# Patient Record
Sex: Female | Born: 1965 | Race: White | Hispanic: No | Marital: Married | Smoking: Never smoker
Health system: Southern US, Community
[De-identification: ages and names within clinical notes are randomized; demographics above are authoritative.]

## PROBLEM LIST (undated history)

## (undated) DIAGNOSIS — G43909 Migraine, unspecified, not intractable, without status migrainosus: Secondary | ICD-10-CM

## (undated) DIAGNOSIS — K760 Fatty (change of) liver, not elsewhere classified: Secondary | ICD-10-CM

## (undated) DIAGNOSIS — G4733 Obstructive sleep apnea (adult) (pediatric): Secondary | ICD-10-CM

## (undated) DIAGNOSIS — F419 Anxiety disorder, unspecified: Secondary | ICD-10-CM

## (undated) DIAGNOSIS — N200 Calculus of kidney: Secondary | ICD-10-CM

## (undated) DIAGNOSIS — N289 Disorder of kidney and ureter, unspecified: Secondary | ICD-10-CM

## (undated) DIAGNOSIS — E7800 Pure hypercholesterolemia, unspecified: Secondary | ICD-10-CM

## (undated) DIAGNOSIS — E039 Hypothyroidism, unspecified: Secondary | ICD-10-CM

## (undated) DIAGNOSIS — D869 Sarcoidosis, unspecified: Secondary | ICD-10-CM

## (undated) DIAGNOSIS — I10 Essential (primary) hypertension: Secondary | ICD-10-CM

## (undated) DIAGNOSIS — F439 Reaction to severe stress, unspecified: Secondary | ICD-10-CM

## (undated) DIAGNOSIS — R7303 Prediabetes: Secondary | ICD-10-CM

## (undated) DIAGNOSIS — K6389 Other specified diseases of intestine: Secondary | ICD-10-CM

## (undated) DIAGNOSIS — M549 Dorsalgia, unspecified: Secondary | ICD-10-CM

## (undated) DIAGNOSIS — R0602 Shortness of breath: Secondary | ICD-10-CM

## (undated) DIAGNOSIS — E119 Type 2 diabetes mellitus without complications: Secondary | ICD-10-CM

## (undated) DIAGNOSIS — L439 Lichen planus, unspecified: Secondary | ICD-10-CM

## (undated) DIAGNOSIS — R110 Nausea: Secondary | ICD-10-CM

## (undated) DIAGNOSIS — K5900 Constipation, unspecified: Secondary | ICD-10-CM

## (undated) DIAGNOSIS — M2550 Pain in unspecified joint: Secondary | ICD-10-CM

## (undated) HISTORY — DX: Other specified diseases of intestine: K63.89

## (undated) HISTORY — DX: Reaction to severe stress, unspecified: F43.9

## (undated) HISTORY — DX: Migraine, unspecified, not intractable, without status migrainosus: G43.909

## (undated) HISTORY — DX: Calculus of kidney: N20.0

## (undated) HISTORY — DX: Nausea: R11.0

## (undated) HISTORY — DX: Hypothyroidism, unspecified: E03.9

## (undated) HISTORY — DX: Fatty (change of) liver, not elsewhere classified: K76.0

## (undated) HISTORY — DX: Obstructive sleep apnea (adult) (pediatric): G47.33

## (undated) HISTORY — DX: Shortness of breath: R06.02

## (undated) HISTORY — PX: LITHOTRIPSY: SUR834

## (undated) HISTORY — DX: Anxiety disorder, unspecified: F41.9

## (undated) HISTORY — DX: Dorsalgia, unspecified: M54.9

## (undated) HISTORY — PX: LYMPH NODE DISSECTION: SHX5087

## (undated) HISTORY — DX: Constipation, unspecified: K59.00

## (undated) HISTORY — DX: Disorder of kidney and ureter, unspecified: N28.9

## (undated) HISTORY — DX: Pure hypercholesterolemia, unspecified: E78.00

## (undated) HISTORY — DX: Sarcoidosis, unspecified: D86.9

## (undated) HISTORY — PX: COLONOSCOPY: SHX174

## (undated) HISTORY — DX: Pain in unspecified joint: M25.50

## (undated) HISTORY — DX: Prediabetes: R73.03

## (undated) HISTORY — DX: Type 2 diabetes mellitus without complications: E11.9

---
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
## 1998-05-04 ENCOUNTER — Other Ambulatory Visit: Admission: RE | Admit: 1998-05-04 | Discharge: 1998-05-04 | Payer: Self-pay | Admitting: Gynecology

## 1999-05-17 ENCOUNTER — Other Ambulatory Visit: Admission: RE | Admit: 1999-05-17 | Discharge: 1999-05-17 | Payer: Self-pay | Admitting: Obstetrics and Gynecology

## 2000-06-20 ENCOUNTER — Encounter: Admission: RE | Admit: 2000-06-20 | Discharge: 2000-06-20 | Payer: Self-pay | Admitting: Family Medicine

## 2000-06-20 ENCOUNTER — Encounter: Payer: Self-pay | Admitting: Family Medicine

## 2000-06-25 ENCOUNTER — Other Ambulatory Visit: Admission: RE | Admit: 2000-06-25 | Discharge: 2000-06-25 | Payer: Self-pay | Admitting: Obstetrics and Gynecology

## 2000-06-27 ENCOUNTER — Encounter: Admission: RE | Admit: 2000-06-27 | Discharge: 2000-08-07 | Payer: Self-pay | Admitting: Family Medicine

## 2000-09-12 ENCOUNTER — Encounter: Payer: Self-pay | Admitting: Family Medicine

## 2000-09-12 ENCOUNTER — Ambulatory Visit (HOSPITAL_COMMUNITY): Admission: RE | Admit: 2000-09-12 | Discharge: 2000-09-12 | Payer: Self-pay | Admitting: Family Medicine

## 2001-10-02 ENCOUNTER — Other Ambulatory Visit: Admission: RE | Admit: 2001-10-02 | Discharge: 2001-10-02 | Payer: Self-pay | Admitting: *Deleted

## 2002-11-13 ENCOUNTER — Ambulatory Visit (HOSPITAL_BASED_OUTPATIENT_CLINIC_OR_DEPARTMENT_OTHER): Admission: RE | Admit: 2002-11-13 | Discharge: 2002-11-13 | Payer: Self-pay | Admitting: Urology

## 2002-11-13 ENCOUNTER — Encounter: Payer: Self-pay | Admitting: Urology

## 2003-01-20 ENCOUNTER — Other Ambulatory Visit: Admission: RE | Admit: 2003-01-20 | Discharge: 2003-01-20 | Payer: Self-pay | Admitting: *Deleted

## 2004-02-11 ENCOUNTER — Ambulatory Visit (HOSPITAL_COMMUNITY): Admission: RE | Admit: 2004-02-11 | Discharge: 2004-02-11 | Payer: Self-pay | Admitting: Gastroenterology

## 2005-03-06 ENCOUNTER — Other Ambulatory Visit: Admission: RE | Admit: 2005-03-06 | Discharge: 2005-03-06 | Payer: Self-pay | Admitting: Gynecology

## 2006-05-25 ENCOUNTER — Other Ambulatory Visit: Admission: RE | Admit: 2006-05-25 | Discharge: 2006-05-25 | Payer: Self-pay | Admitting: Obstetrics and Gynecology

## 2006-07-23 ENCOUNTER — Ambulatory Visit (HOSPITAL_BASED_OUTPATIENT_CLINIC_OR_DEPARTMENT_OTHER): Admission: RE | Admit: 2006-07-23 | Discharge: 2006-07-23 | Payer: Self-pay | Admitting: Obstetrics and Gynecology

## 2006-12-18 ENCOUNTER — Ambulatory Visit (HOSPITAL_BASED_OUTPATIENT_CLINIC_OR_DEPARTMENT_OTHER): Admission: RE | Admit: 2006-12-18 | Discharge: 2006-12-18 | Payer: Self-pay | Admitting: Obstetrics and Gynecology

## 2006-12-18 HISTORY — PX: NOVASURE ABLATION: SHX5394

## 2007-06-04 ENCOUNTER — Other Ambulatory Visit: Admission: RE | Admit: 2007-06-04 | Discharge: 2007-06-04 | Payer: Self-pay | Admitting: Obstetrics and Gynecology

## 2007-06-20 HISTORY — PX: ROTATOR CUFF REPAIR: SHX139

## 2007-10-28 ENCOUNTER — Encounter: Admission: RE | Admit: 2007-10-28 | Discharge: 2007-10-28 | Payer: Self-pay | Admitting: Orthopaedic Surgery

## 2008-06-04 ENCOUNTER — Other Ambulatory Visit: Admission: RE | Admit: 2008-06-04 | Discharge: 2008-06-04 | Payer: Self-pay | Admitting: Obstetrics and Gynecology

## 2008-12-15 ENCOUNTER — Encounter: Admission: RE | Admit: 2008-12-15 | Discharge: 2008-12-15 | Payer: Self-pay | Admitting: Internal Medicine

## 2009-09-17 DIAGNOSIS — E038 Other specified hypothyroidism: Secondary | ICD-10-CM

## 2009-09-17 HISTORY — DX: Other specified hypothyroidism: E03.8

## 2010-11-01 NOTE — Op Note (Signed)
[name], [name]               ACCOUNT NO.:  [id number]   MEDICAL RECORD NO.:  [id number]          PATIENT TYPE:  AMB   LOCATION:  [facility]                         FACILITY:  [facility]   PHYSICIAN:  Cynthia P. Romine, M.D.DATE OF BIRTH:  08-14-1965   DATE OF PROCEDURE:  DATE OF DISCHARGE:                               OPERATIVE REPORT   PREOPERATIVE DIAGNOSIS:  Menorrhagia.   POSTOPERATIVE DIAGNOSIS:  Menorrhagia.   PROCEDURE:  NovaSure endometrial ablation and hysteroscopy.   SURGEON:  Cynthia P. Romine, M.D.   ANESTHESIA:  General by LMA.   ESTIMATED BLOOD LOSS:  Minimal.   COMPLICATIONS:  None.   DESCRIPTION OF PROCEDURE:  The patient was taken to the operating room  and after the induction of adequate general anesthesia by LMA was placed  in the dorsal lithotomy position and prepped and draped in the usual  fashion.  A Graves speculum was inserted in the cervix was grasped on  its anterior lip with a single-tooth tenaculum.  The uterus was sounded  to 9 cm.  The cervix was then dilated to a #8 Hegar, and the cervical  length, when assessed with a #8 Hegar, was 4.5 cm.  Therefore, the  cavity length was 4.5 cm.  The NovaSure array was introduced into the  cavity; it was seated, and the cavity width of 3.7 cm was obtained.  The  cavity assessment test was passed.  NovaSure ablation was then carried  out.  It to 100 seconds.  There were no complications.  The array was  pulled back into the sleeve and removed from the uterus.  Hysteroscopy  was then carried out; and the endometrium appeared well ablated.  Photographic documentation was taken.  The hysteroscope was removed.  The saline deficit was 0, the instruments were removed from the vagina;  and the procedure was terminated.  The patient went to the recovery room  in satisfactory condition.      Cynthia P. Romine, M.D.  Electronically Signed     CPR/MEDQ  D:  12/18/2006  T:  12/18/2006  Job:  [id number]

## 2011-02-27 ENCOUNTER — Other Ambulatory Visit: Payer: Self-pay | Admitting: Specialist

## 2011-03-07 ENCOUNTER — Ambulatory Visit
Admission: RE | Admit: 2011-03-07 | Discharge: 2011-03-07 | Disposition: A | Discharge status: Home / Self Care | Payer: BC Managed Care – PPO | Source: Ambulatory Visit | Attending: Specialist | Admitting: Specialist

## 2011-04-04 LAB — POCT PREGNANCY, URINE
Operator id: 268271
Preg Test, Ur: NEGATIVE

## 2011-04-05 LAB — CBC
HCT: 38.2
MCHC: 35.3
Platelets: 422 — ABNORMAL HIGH
RDW: 13.3

## 2013-04-23 ENCOUNTER — Encounter: Payer: Self-pay | Admitting: Certified Nurse Midwife

## 2013-04-25 ENCOUNTER — Ambulatory Visit (INDEPENDENT_AMBULATORY_CARE_PROVIDER_SITE_OTHER): Payer: BC Managed Care – PPO | Admitting: Certified Nurse Midwife

## 2013-04-25 ENCOUNTER — Encounter: Payer: Self-pay | Admitting: Certified Nurse Midwife

## 2013-04-25 VITALS — BP 124/80 | HR 74 | Resp 16 | Ht 61.75 in | Wt 212.0 lb

## 2013-04-25 DIAGNOSIS — Z01419 Encounter for gynecological examination (general) (routine) without abnormal findings: Secondary | ICD-10-CM

## 2013-04-25 NOTE — Patient Instructions (Signed)

## 2013-04-25 NOTE — Progress Notes (Signed)
47 y.o. G12P2002 Married Caucasian Fe here for annual exam. Periods very scant, just symptomatic that she knows it is her cycle time.  No health issues today. Sees PCP for  aex and labs.   Patient's last menstrual period was 04/07/2013.          Sexually active: yes  The current method of family planning is vasectomy and a Ablation 12/18/06 Exercising: no   Smoker:  no  Health Maintenance: Pap: 10/18/11 neg HPVHR  Not detected MMG:  03/2013 normal Colonoscopy:  2005 normal BMD:  never TDaP: 05/2008 Labs: Hgb  PCP,   U/A  PCP   reports that she has never smoked. She has never used smokeless tobacco. She reports that she drinks about 0.5 ounces of alcohol per week. She reports that she does not use illicit drugs.  Past Medical History  Diagnosis Date  . Subclinical hypothyroidism 4/11  . Kidney stones   . Migraines   . Hypercholesteremia     Past Surgical History  Procedure Laterality Date  . Novasure ablation  12-18-06  . Lithotripsy    . Rotator cuff repair Right 2009    Current Outpatient Prescriptions  Medication Sig Dispense Refill  . Omega-3 Fatty Acids (FISH OIL PO) Take by mouth daily.       No current facility-administered medications for this visit.    Family History  Problem Relation Age of Onset  . Diabetes Mother   . Cancer Paternal Grandmother     colon    ROS:  Pertinent items are noted in HPI.  Otherwise, a comprehensive ROS was negative.  Exam:   BP 124/80  Pulse 74  Resp 16  Ht 5' 1.75" (1.568 m)  Wt 212 lb (96.163 kg)  BMI 39.11 kg/m2  LMP 04/07/2013 Height: 5' 1.75" (156.8 cm)  Ht Readings from Last 3 Encounters:  04/25/13 5' 1.75" (1.568 m)    General appearance: alert, cooperative and appears stated age Head: Normocephalic, without obvious abnormality, atraumatic Neck: no adenopathy, supple, symmetrical, trachea midline and thyroid normal to inspection and palpation Lungs: clear to auscultation bilaterally Breasts: normal appearance, no  masses or tenderness, No nipple retraction or dimpling, No nipple discharge or bleeding, No axillary or supraclavicular adenopathy Heart: regular rate and rhythm Abdomen: soft, non-tender; no masses,  no organomegaly Extremities: extremities normal, atraumatic, no cyanosis or edema Skin: Skin color, texture, turgor normal. No rashes or lesions Lymph nodes: Cervical, supraclavicular, and axillary nodes normal. No abnormal inguinal nodes palpated Neurologic: Grossly normal   Pelvic: External genitalia:  no lesions              Urethra:  normal appearing urethra with no masses, tenderness or lesions              Bartholin's and Skene's: normal                 Vagina: normal appearing vagina with normal color and discharge, no lesions              Cervix: normal, non tender              Pap taken: no Bimanual Exam:  Uterus:  normal size, contour, position, consistency, mobility, non-tender and mid position              Adnexa: normal adnexa and no mass, fullness, tenderness               Rectovaginal: Confirms  Anus:  normal sphincter tone, no lesions  A:  Well Woman with normal exam  Hypothyroid stable no medication  P:   Reviewed health and wellness pertinent to exam  Continue follow up as indicated  Pap smear as per guidelines   Mammogram yearly pap smear not taken today  counseled on breast self exam, mammography screening, adequate intake of calcium and vitamin D, diet and exercise  return annually or prn  An After Visit Summary was printed and given to the patient.

## 2013-04-27 NOTE — Progress Notes (Signed)
Note reviewed, agree with plan.  Izaha Shughart, MD  

## 2013-04-27 NOTE — Progress Notes (Signed)
Note reviewed, agree with plan.  Georgianna Band, MD  

## 2014-04-20 ENCOUNTER — Encounter: Payer: Self-pay | Admitting: Certified Nurse Midwife

## 2014-11-26 ENCOUNTER — Encounter: Payer: Self-pay | Admitting: Certified Nurse Midwife

## 2014-11-26 ENCOUNTER — Ambulatory Visit (INDEPENDENT_AMBULATORY_CARE_PROVIDER_SITE_OTHER): Payer: BLUE CROSS/BLUE SHIELD | Admitting: Certified Nurse Midwife

## 2014-11-26 VITALS — BP 118/80 | HR 72 | Resp 16 | Ht 61.75 in | Wt 197.0 lb

## 2014-11-26 DIAGNOSIS — Z01419 Encounter for gynecological examination (general) (routine) without abnormal findings: Secondary | ICD-10-CM

## 2014-11-26 DIAGNOSIS — Z0000 Encounter for general adult medical examination without abnormal findings: Secondary | ICD-10-CM

## 2014-11-26 DIAGNOSIS — Z124 Encounter for screening for malignant neoplasm of cervix: Secondary | ICD-10-CM | POA: Diagnosis not present

## 2014-11-26 LAB — POCT URINALYSIS DIPSTICK
Bilirubin, UA: NEGATIVE
GLUCOSE UA: NEGATIVE
Ketones, UA: NEGATIVE
LEUKOCYTES UA: NEGATIVE
Nitrite, UA: NEGATIVE
PH UA: 5
PROTEIN UA: NEGATIVE
RBC UA: NEGATIVE
Urobilinogen, UA: NEGATIVE

## 2014-11-26 NOTE — Progress Notes (Signed)
49 y.o. G55P2002 Married  Caucasian Fe here for annual exam. Denies any hot flashes or night sweats. No menses or spotting with ablation. Seeing Dr.Vaughn for evaluation, labs and now is working diet,exercise and supplement and labs. Has lost 15 pounds over the past  2 months. No health issues today. Survived tax preparation season again!!  No LMP recorded. Patient has had an ablation.          Sexually active: Yes.    The current method of family planning is vasectomy.    Exercising: Yes.    bike Smoker:  no  Health Maintenance: Pap:  10-18-11 neg HPV HR neg MMG: 10-15-14 category b density,birads 2:neg Colonoscopy:  2005 neg BMD:   none TDaP:  2008 Labs: Poct urine-neg Self breast exam: done occ   reports that she has never smoked. She has never used smokeless tobacco. She reports that she drinks alcohol. She reports that she does not use illicit drugs.  Past Medical History  Diagnosis Date  . Subclinical hypothyroidism 4/11  . Kidney stones   . Migraines   . Hypercholesteremia     Past Surgical History  Procedure Laterality Date  . Novasure ablation  12-18-06  . Lithotripsy    . Rotator cuff repair Right 2009    Current Outpatient Prescriptions  Medication Sig Dispense Refill  . liothyronine (CYTOMEL) 25 MCG tablet daily. Take 2 pills in the morning, one at noon & 2 at night  1  . metFORMIN (GLUCOPHAGE) 500 MG tablet One in the morning & 1 at night    . Multiple Vitamins-Minerals (MULTIVITAMIN PO) Take by mouth daily.    . Omega-3 Fatty Acids (FISH OIL PO) Take by mouth daily.    . progesterone (PROMETRIUM) 100 MG capsule Take 2 pills day 15-28     No current facility-administered medications for this visit.    Family History  Problem Relation Age of Onset  . Diabetes Mother   . Cancer Paternal Grandmother     colon    ROS:  Pertinent items are noted in HPI.  Otherwise, a comprehensive ROS was negative.  Exam:   BP 118/80 mmHg  Pulse 72  Resp 16  Ht 5' 1.75"  (1.568 m)  Wt 197 lb (89.359 kg)  BMI 36.35 kg/m2 Height: 5' 1.75" (156.8 cm) Ht Readings from Last 3 Encounters:  11/26/14 5' 1.75" (1.568 m)  04/25/13 5' 1.75" (1.568 m)    General appearance: alert, cooperative and appears stated age Head: Normocephalic, without obvious abnormality, atraumatic Neck: no adenopathy, supple, symmetrical, trachea midline and thyroid normal to inspection and palpation Lungs: clear to auscultation bilaterally Breasts: normal appearance, no masses or tenderness, No nipple retraction or dimpling, No nipple discharge or bleeding, No axillary or supraclavicular adenopathy Heart: regular rate and rhythm Abdomen: soft, non-tender; no masses,  no organomegaly Extremities: extremities normal, atraumatic, no cyanosis or edema Skin: Skin color, texture, turgor normal. No rashes or lesions Lymph nodes: Cervical, supraclavicular, and axillary nodes normal. No abnormal inguinal nodes palpated Neurologic: Grossly normal   Pelvic: External genitalia:  no lesions              Urethra:  normal appearing urethra with no masses, tenderness or lesions              Bartholin's and Skene's: normal                 Vagina: normal appearing vagina with normal color and discharge, no lesions  Cervix: normal, non tender, no lesions, bleeding with pap only              Pap taken: Yes.   Bimanual Exam:  Uterus:  normal size, contour, position, consistency, mobility, non-tender              Adnexa: normal adnexa and no mass, fullness, tenderness               Rectovaginal: Confirms               Anus:  normal sphincter tone, no lesions  Chaperone present: Yes  A:  Well Woman with normal exam  Weight loss program with Dr Alessandra Bevels  P:   Reviewed health and wellness pertinent to exam  Continue follow up as indicated  Pap smear with HPV reflex   counseled on breast self exam, mammography screening, adequate intake of calcium and vitamin D, diet and exercise. Keep up  the good work with weight loss.  return annually or prn  An After Visit Summary was printed and given to the patient.

## 2014-11-26 NOTE — Patient Instructions (Signed)

## 2014-11-28 LAB — IPS PAP TEST WITH REFLEX TO HPV

## 2014-11-29 NOTE — Progress Notes (Signed)
Reviewed personally.  M. Suzanne Batoul Limes, MD.  

## 2015-02-08 ENCOUNTER — Other Ambulatory Visit: Payer: Self-pay | Admitting: Nurse Practitioner

## 2015-02-08 ENCOUNTER — Ambulatory Visit
Admission: RE | Admit: 2015-02-08 | Discharge: 2015-02-08 | Disposition: A | Discharge status: Home / Self Care | Payer: BLUE CROSS/BLUE SHIELD | Source: Ambulatory Visit | Attending: Nurse Practitioner | Admitting: Nurse Practitioner

## 2015-02-08 DIAGNOSIS — R0602 Shortness of breath: Secondary | ICD-10-CM

## 2015-06-25 ENCOUNTER — Encounter: Payer: Self-pay | Admitting: Certified Nurse Midwife

## 2015-06-25 ENCOUNTER — Ambulatory Visit (INDEPENDENT_AMBULATORY_CARE_PROVIDER_SITE_OTHER): Payer: BLUE CROSS/BLUE SHIELD | Admitting: Certified Nurse Midwife

## 2015-06-25 VITALS — BP 118/70 | HR 70 | Temp 98.1°F | Resp 16 | Ht 61.75 in | Wt 189.0 lb

## 2015-06-25 DIAGNOSIS — N949 Unspecified condition associated with female genital organs and menstrual cycle: Secondary | ICD-10-CM | POA: Diagnosis not present

## 2015-06-25 DIAGNOSIS — N390 Urinary tract infection, site not specified: Secondary | ICD-10-CM | POA: Diagnosis not present

## 2015-06-25 LAB — POCT URINALYSIS DIPSTICK
Bilirubin, UA: NEGATIVE
Glucose, UA: NEGATIVE
KETONES UA: NEGATIVE
Leukocytes, UA: NEGATIVE
Nitrite, UA: NEGATIVE
Protein, UA: NEGATIVE
RBC UA: NEGATIVE
UROBILINOGEN UA: NEGATIVE
pH, UA: 5

## 2015-06-25 MED ORDER — PHENAZOPYRIDINE HCL 200 MG PO TABS: 200.0000 mg | ORAL_TABLET | Freq: Three times a day (TID) | ORAL | Status: DC | PRN

## 2015-06-25 MED ORDER — NITROFURANTOIN MONOHYD MACRO 100 MG PO CAPS: 100.0000 mg | ORAL_CAPSULE | Freq: Two times a day (BID) | ORAL | Status: DC

## 2015-06-25 NOTE — Progress Notes (Signed)
[age] married white female g2p2002 here with complaint of UTI, with onset  on 2 weeks. Patient complaining of urinary frequency/urgency/ and pain with urination at end of stream. Also having some urinary incontinence. Patient denies fever, chills, nausea or back pain. No new personal products. Patient feels not related to sexual activity. Denies any vaginal symptoms except occasional burning and dryness after intercourse.  . Patient is drinking adequate water intake.   O: Healthy female WDWN Affect: Normal, orientation x 3 Skin : warm and dry CVAT: negative bilateral Abdomen: positive for suprapubic tenderness  Pelvic exam: External genital area: normal, no lesions Bladder,Urethra tender, Urethral meatus: tender,no redness  Vagina: scant watery  vaginal discharge, normal appearance  Affirm taken Cervix: normal, non tender Uterus:normal,non tender Adnexa: normal non tender, no fullness or masses   A: UTI Normal pelvic exam Poct urine-neg R/O vaginal infection ?vaginal dryness  P: Reviewed findings of UTI and need for treatment. XB:[id number]:Macrobid see order Rx Pyridium see order OZH:YQMVHLab:Urine culture Reviewed warning signs and symptoms of UTI and need to advise if occurring. Encouraged to limit soda, tea, and coffee and be sure to increase water intake. Will treat per affirm if indicated. Discussed vaginal dryness and association with UTI and vaginal symptoms. Discussed OTC coconut trial, instructions given for applicator use. Questions addressed.  Rv prn

## 2015-06-25 NOTE — Patient Instructions (Signed)

## 2015-06-26 LAB — WET PREP BY MOLECULAR PROBE
CANDIDA SPECIES: NEGATIVE
Gardnerella vaginalis: POSITIVE — AB
Trichomonas vaginosis: NEGATIVE

## 2015-06-28 LAB — URINE CULTURE

## 2015-06-28 NOTE — Progress Notes (Signed)
Reviewed personally.  M. Suzanne Cesilia Shinn, MD.  

## 2015-06-29 ENCOUNTER — Other Ambulatory Visit: Payer: Self-pay | Admitting: Certified Nurse Midwife

## 2015-06-29 DIAGNOSIS — B9689 Other specified bacterial agents as the cause of diseases classified elsewhere: Secondary | ICD-10-CM

## 2015-06-29 DIAGNOSIS — N760 Acute vaginitis: Principal | ICD-10-CM

## 2015-06-29 MED ORDER — METRONIDAZOLE 0.75 % VA GEL: 1.0000 | Freq: Every day | VAGINAL | Status: DC

## 2015-12-14 ENCOUNTER — Encounter: Payer: Self-pay | Admitting: Certified Nurse Midwife

## 2015-12-14 ENCOUNTER — Ambulatory Visit (INDEPENDENT_AMBULATORY_CARE_PROVIDER_SITE_OTHER): Payer: BLUE CROSS/BLUE SHIELD | Admitting: Certified Nurse Midwife

## 2015-12-14 VITALS — BP 102/64 | HR 76 | Resp 16 | Ht 61.75 in | Wt 189.0 lb

## 2015-12-14 DIAGNOSIS — Z0000 Encounter for general adult medical examination without abnormal findings: Secondary | ICD-10-CM | POA: Diagnosis not present

## 2015-12-14 DIAGNOSIS — N951 Menopausal and female climacteric states: Secondary | ICD-10-CM

## 2015-12-14 DIAGNOSIS — Z01419 Encounter for gynecological examination (general) (routine) without abnormal findings: Secondary | ICD-10-CM | POA: Diagnosis not present

## 2015-12-14 LAB — TSH: TSH: 2.44 m[IU]/L

## 2015-12-14 NOTE — Patient Instructions (Signed)

## 2015-12-14 NOTE — Progress Notes (Signed)
50 y.o. 92P2002 Married  Caucasian Fe here for annual exam. Periods occurring again monthly scant amount, one day. Previous ablation. No cramping or other symptoms. Some night sweats, no hot flashes. Denies pelvic pain or vaginal dryness.. Plans to see PCP for aex, labs soon. Family history of colon cancer, MGM. Plans colonoscopy this year. Has been working on weight loss! No other health issues today.   No LMP recorded. Patient has had an ablation.          Sexually active: Yes.    The current method of family planning is vasectomy and ablation.    Exercising: No.  The patient does not participate in regular exercise at present. Smoker:  no  Health Maintenance: Pap:  11/26/14 Pap smear negative 02 MMG:  10/20/15 BIRADS1 negative Colonoscopy:  2005 negative BMD:   none TDaP:  2008 Labs: will draw if needed.   reports that she has never smoked. She has never used smokeless tobacco. She reports that she does not drink alcohol or use illicit drugs.  Past Medical History  Diagnosis Date  . Subclinical hypothyroidism 4/11  . Kidney stones   . Migraines   . Hypercholesteremia     Past Surgical History  Procedure Laterality Date  . Novasure ablation  12-18-06  . Lithotripsy    . Rotator cuff repair Right 2009    Current Outpatient Prescriptions  Medication Sig Dispense Refill  . CONTRAVE 8-90 MG TB12 Take 2 tablets by mouth 2 (two) times daily.  0  . metroNIDAZOLE (METROGEL) 0.75 % vaginal gel Place 1 Applicatorful vaginally at bedtime. 70 g 0  . Multiple Vitamins-Minerals (MULTIVITAMIN PO) Take by mouth daily.    . nitrofurantoin, macrocrystal-monohydrate, (MACROBID) 100 MG capsule Take 1 capsule (100 mg total) by mouth 2 (two) times daily. 14 capsule 0  . Omega-3 Fatty Acids (FISH OIL PO) Take by mouth daily.    . phenazopyridine (PYRIDIUM) 200 MG tablet Take 1 tablet (200 mg total) by mouth 3 (three) times daily as needed for pain. 15 tablet 0   No current facility-administered  medications for this visit.    Family History  Problem Relation Age of Onset  . Diabetes Mother   . Cancer Paternal Grandmother     colon    ROS:  Pertinent items are noted in HPI.  Otherwise, a comprehensive ROS was negative.  Exam:   There were no vitals taken for this visit.   Ht Readings from Last 3 Encounters:  06/25/15 5' 1.75" (1.568 m)  11/26/14 5' 1.75" (1.568 m)  04/25/13 5' 1.75" (1.568 m)    General appearance: alert, cooperative and appears stated age Head: Normocephalic, without obvious abnormality, atraumatic Neck: no adenopathy, supple, symmetrical, trachea midline and thyroid normal to inspection and palpation Lungs: clear to auscultation bilaterally Breasts: normal appearance, no masses or tenderness, No nipple retraction or dimpling, No nipple discharge or bleeding, No axillary or supraclavicular adenopathy Heart: regular rate and rhythm Abdomen: soft, non-tender; no masses,  no organomegaly Extremities: extremities normal, atraumatic, no cyanosis or edema Skin: Skin color, texture, turgor normal. No rashes or lesions Lymph nodes: Cervical, supraclavicular, and axillary nodes normal. No abnormal inguinal nodes palpated Neurologic: Grossly normal   Pelvic: External genitalia:  no lesions              Urethra:  normal appearing urethra with no masses, tenderness or lesions              Bartholin's and Skene's: normal  Vagina: normal appearing vagina with normal color and discharge, no lesions              Cervix: normal,non tender, no lesions              Pap taken: No. Bimanual Exam:  Uterus:  normal size, contour, position, consistency, mobility, non-tender              Adnexa: normal adnexa and no mass, fullness, tenderness               Rectovaginal: Confirms               Anus:  normal sphincter tone, no lesions  Chaperone present: yes  A:  Well Woman with normal exam  ?Perimenopausal with bleeding with ablation  Screening  labs  Colonoscopy this year  P:   Reviewed health and wellness pertinent to exam  Discussed menopause/perimenopause and etiology, expectations of bleeding profile. Recommend labs to evaluate. Agreeable. Questions addressed. Handout given.  Labs FSH, TSH, Prolactin, Vitamin D  Patient will call after age 50 for referral to Dr. Loreta AveMann for colonscopy.  Pap smear as above not taken   counseled on breast self exam, mammography screening, menopause, adequate intake of calcium and vitamin D, diet and exercise  return annually or prn  An After Visit Summary was printed and given to the patient.

## 2015-12-15 LAB — VITAMIN D 25 HYDROXY (VIT D DEFICIENCY, FRACTURES): Vit D, 25-Hydroxy: 36 ng/mL (ref 30–100)

## 2015-12-15 LAB — PROLACTIN: PROLACTIN: 10.8 ng/mL

## 2015-12-15 LAB — FOLLICLE STIMULATING HORMONE: FSH: 15.6 m[IU]/mL

## 2015-12-15 NOTE — Progress Notes (Signed)
Reviewed personally.  M. Suzanne Davina Howlett, MD.  

## 2016-02-22 ENCOUNTER — Ambulatory Visit: Payer: BLUE CROSS/BLUE SHIELD | Admitting: Certified Nurse Midwife

## 2016-06-19 DIAGNOSIS — L439 Lichen planus, unspecified: Secondary | ICD-10-CM

## 2016-06-19 HISTORY — DX: Lichen planus, unspecified: L43.9

## 2016-10-25 ENCOUNTER — Other Ambulatory Visit: Payer: Self-pay | Admitting: Gastroenterology

## 2016-10-25 DIAGNOSIS — R945 Abnormal results of liver function studies: Principal | ICD-10-CM

## 2016-10-25 DIAGNOSIS — R7989 Other specified abnormal findings of blood chemistry: Secondary | ICD-10-CM

## 2016-11-02 ENCOUNTER — Ambulatory Visit
Admission: RE | Admit: 2016-11-02 | Discharge: 2016-11-02 | Disposition: A | Discharge status: Home / Self Care | Payer: BLUE CROSS/BLUE SHIELD | Source: Ambulatory Visit | Attending: Gastroenterology | Admitting: Gastroenterology

## 2016-11-02 DIAGNOSIS — R7989 Other specified abnormal findings of blood chemistry: Secondary | ICD-10-CM

## 2016-11-02 DIAGNOSIS — R945 Abnormal results of liver function studies: Principal | ICD-10-CM

## 2016-11-16 ENCOUNTER — Encounter: Payer: Self-pay | Admitting: Certified Nurse Midwife

## 2016-12-26 ENCOUNTER — Ambulatory Visit: Payer: BLUE CROSS/BLUE SHIELD | Admitting: Certified Nurse Midwife

## 2017-02-22 ENCOUNTER — Ambulatory Visit (INDEPENDENT_AMBULATORY_CARE_PROVIDER_SITE_OTHER): Payer: Self-pay

## 2017-02-22 ENCOUNTER — Encounter (INDEPENDENT_AMBULATORY_CARE_PROVIDER_SITE_OTHER): Payer: Self-pay | Admitting: Orthopedic Surgery

## 2017-02-22 ENCOUNTER — Ambulatory Visit (INDEPENDENT_AMBULATORY_CARE_PROVIDER_SITE_OTHER): Payer: BLUE CROSS/BLUE SHIELD | Admitting: Orthopedic Surgery

## 2017-02-22 VITALS — BP 146/99 | HR 97 | Resp 14 | Ht 62.0 in | Wt 185.0 lb

## 2017-02-22 DIAGNOSIS — M25512 Pain in left shoulder: Secondary | ICD-10-CM | POA: Diagnosis not present

## 2017-02-22 DIAGNOSIS — M7542 Impingement syndrome of left shoulder: Secondary | ICD-10-CM

## 2017-02-22 DIAGNOSIS — G8929 Other chronic pain: Secondary | ICD-10-CM | POA: Diagnosis not present

## 2017-02-22 NOTE — Progress Notes (Signed)
Office Visit Note   Patient: [name]           Date of Birth: 1965-08-06           MRN: [id number] Visit Date: 02/22/2017              Requested by: No referring provider defined for this encounter. PCP: System, Pcp Not In   Assessment & Plan: Visit Diagnoses:  1. Impingement syndrome of left shoulder   2. Chronic left shoulder pain     Plan:  #1: [name] is not interested in having a corticosteroid injection. She's had one previously. #2: We will proceed with a MRI scan of the left shoulder to rule out rotator cuff tear.  Follow-Up Instructions: No Follow-up on file.   Orders:  Orders Placed This Encounter  Procedures  . XR Shoulder Left  . MR Shoulder Left w/o contrast   No orders of the defined types were placed in this encounter.     Procedures: No procedures performed   Clinical Data: No additional findings.   Subjective: Chief Complaint  Patient presents with  . Left Shoulder - Pain    [name] is a 51 y o that presents with chronic Left shoulder pain x 6-8 months. Denies injury, Limited ROM overhead and twisting the arm. 2009 Right shoulder RTC tear surgery    HPI  [name] is a 51 year old white female who is seen today with chronic left shoulder pain. She's had limited range of motion with overhead activity. She's had a previous corticosteroid injection in 2013. Beneficial for a while. She however now is complaining of pain in the   Review of Systems  Constitutional: Negative for chills, fatigue and fever.  Eyes: Negative for itching.  Respiratory: Negative for chest tightness and shortness of breath.   Cardiovascular: Negative for chest pain, palpitations and leg swelling.  Gastrointestinal: Negative for blood in stool, constipation and diarrhea.  Musculoskeletal: Negative for back pain, joint swelling, neck pain and neck stiffness.  Neurological: Negative for dizziness, weakness, numbness and headaches.  Hematological: Does not  bruise/bleed easily.  Psychiatric/Behavioral: Negative for sleep disturbance. The patient is not nervous/anxious.      Objective: Vital Signs: BP (!) 146/99   Pulse 97   Resp 14   Ht  (1.575 m)   Wt 185 lb (83.9 kg)   BMI 33.84 kg/m   Physical Exam  Constitutional: She is oriented to person, place, and time. She appears well-developed and well-nourished.  HENT:  Head: Normocephalic and atraumatic.  Eyes: Pupils are equal, round, and reactive to light. EOM are normal.  Pulmonary/Chest: Effort normal.  Neurological: She is alert and oriented to person, place, and time.  Skin: Skin is warm and dry.  Psychiatric: She has a normal mood and affect. [name] behavior is normal. Judgment and thought content normal.    Ortho Exam Today she has range of motion abduction 150 flexion 170. External rotation 90 of abduction and 60 internal rotation 45. Positive empty can test. She has weakness with external rotation. Abduction is fairly strong but is a little bit weaker than on the right side. Some tenderness over the before meals joint.  Specialty Comments:  No specialty comments available.  Imaging: Xr Shoulder Left  Result Date: 02/22/2017 Three-view x-ray of the left shoulder reveals some before meals degenerative changes with spurring of the acromion. She does have a type 2 to  2-1/2 acromion. Glenohumeral joint appears intact.    PMFS History: There  are no active problems to display for this patient.  Past Medical History:  Diagnosis Date  . Hypercholesteremia   . Kidney stones   . Migraines   . Subclinical hypothyroidism 4/11    Family History  Problem Relation Age of Onset  . Diabetes Mother   . Cancer Paternal Grandmother        colon    Past Surgical History:  Procedure Laterality Date  . LITHOTRIPSY    . NOVASURE ABLATION  12-18-06  . ROTATOR CUFF REPAIR Right 2009   Social History   Occupational History  . Not on file.   Social History Main Topics  .  Smoking status: Never Smoker  . Smokeless tobacco: Never Used  . Alcohol use No  . Drug use: No  . Sexual activity: Yes    Partners: Male    Birth control/ protection: Surgical     Comment: Ablation, Vasectomy

## 2017-03-07 ENCOUNTER — Ambulatory Visit (INDEPENDENT_AMBULATORY_CARE_PROVIDER_SITE_OTHER): Payer: BLUE CROSS/BLUE SHIELD | Admitting: Certified Nurse Midwife

## 2017-03-07 ENCOUNTER — Encounter: Payer: Self-pay | Admitting: Certified Nurse Midwife

## 2017-03-07 VITALS — BP 118/78 | HR 68 | Resp 16 | Ht 61.75 in | Wt 199.0 lb

## 2017-03-07 DIAGNOSIS — N9089 Other specified noninflammatory disorders of vulva and perineum: Secondary | ICD-10-CM | POA: Diagnosis not present

## 2017-03-07 DIAGNOSIS — Z01419 Encounter for gynecological examination (general) (routine) without abnormal findings: Secondary | ICD-10-CM | POA: Diagnosis not present

## 2017-03-07 DIAGNOSIS — Z0000 Encounter for general adult medical examination without abnormal findings: Secondary | ICD-10-CM | POA: Diagnosis not present

## 2017-03-07 NOTE — Patient Instructions (Signed)

## 2017-03-07 NOTE — Progress Notes (Signed)
51 y.o. G35P2002 Married  Caucasian Fe here for annual exam. Periods scant to none with ablation. Patient had colonoscopy and was negative. Had [place] for fatty liver and real early stage per patient, was seen by Dr.Mann and will follow there if needed. Has noted some breast sharp pain once or twice monthly, but has been in swim suits a lot and feel related to no support. Had Mammogram in 11/01/16 all normal. Has not in a while now. No mass noted or skin change or nipple discharge. Some excessive caffeine. Saw nutritionist today to begin weight loss program. Has gained 10 pounds over the last year. Plans to start working out also. Noted a bump in vaginal area off and on please check. No other health issues today.   Patient's last menstrual period was 12/03/2016.          Sexually active: Yes.    The current method of family planning is vasectomy & ablation.    Exercising: No.  exercise Smoker:  no  Health Maintenance: Pap:  11-26-14 neg History of Abnormal Pap: no MMG:  11-01-16 category b density birads 1:neg Self Breast exams: yes Colonoscopy:  11-29-16 neg f/u 30yrs BMD:   none TDaP:  2008 ? UTD declines today Shingles: no Pneumonia: no Hep C and HIV: not done Labs: yes   reports that she has never smoked. She has never used smokeless tobacco. She reports that she does not drink alcohol or use drugs.  Past Medical History:  Diagnosis Date  . Hypercholesteremia   . Kidney stones   . Migraines   . Subclinical hypothyroidism 4/11    Past Surgical History:  Procedure Laterality Date  . LITHOTRIPSY    . NOVASURE ABLATION  12-18-06  . ROTATOR CUFF REPAIR Right 2009    Current Outpatient Prescriptions  Medication Sig Dispense Refill  . MILK THISTLE PO Take by mouth as needed.    . Omega-3 Fatty Acids (FISH OIL PO) Take by mouth daily.    . vitamin E 200 UNIT capsule Take 200 Units by mouth daily.     No current facility-administered medications for this visit.     Family History   Problem Relation Age of Onset  . Diabetes Mother   . Cancer Paternal Grandmother        colon    ROS:  Pertinent items are noted in HPI.  Otherwise, a comprehensive ROS was negative.  Exam:   BP 118/78   Pulse 68   Resp 16   Ht 5' 1.75" (1.568 m)   Wt 199 lb (90.3 kg)   LMP 12/03/2016   BMI 36.69 kg/m  Height: 5' 1.75" (156.8 cm) Ht Readings from Last 3 Encounters:  03/07/17 5' 1.75" (1.568 m)  02/22/17  (1.575 m)  12/14/15 5' 1.75" (1.568 m)    General appearance: alert, cooperative and appears stated age Head: Normocephalic, without obvious abnormality, atraumatic Neck: no adenopathy, supple, symmetrical, trachea midline and thyroid normal to inspection and palpation Lungs: clear to auscultation bilaterally Breasts: normal appearance, no masses or tenderness, No nipple retraction or dimpling, No nipple discharge or bleeding, No axillary or supraclavicular adenopathy, no point of tenderness noted bilateral Heart: regular rate and rhythm Abdomen: soft, non-tender; no masses,  no organomegaly Extremities: extremities normal, atraumatic, no cyanosis or edema Skin: Skin color, texture, turgor normal. No rashes or lesions Lymph nodes: Cervical, supraclavicular, and axillary nodes normal. No abnormal inguinal nodes palpated Neurologic: Grossly normal   Pelvic: External genitalia: normal female, nickel  size round flat cauliflower like appearance with white raised accent lesion noted on right vulva area, non tender, left normal appearance              Urethra:  normal appearing urethra with no masses, tenderness or lesions              Bartholin's and Skene's: normal                 Vagina: normal appearing vagina with normal color and discharge, no lesions              Cervix: no cervical motion tenderness, no lesions and normal appearance              Pap taken: No. Bimanual Exam:  Uterus:  normal size, contour, position, consistency, mobility, non-tender               Adnexa: normal adnexa and no mass, fullness, tenderness               Rectovaginal: Confirms               Anus:  normal sphincter tone, no lesions  Chaperone present: yes  A:  Well Woman with normal exam  Contraception spouse vasectomy  Perimenopausal, history of ablation  Right Vulvar lesion  Obese starting weight loss program now  Screening labs  P:   Reviewed health and wellness pertinent to exam  Discussed etiology of perimenopause/menopause and expectations. Questions addressed.  Discussed area of concern on vulva,( shown area to patient with mirror also)and feel biopsy of lesion indicated, for appropriate management. Patient agreeable. Patient will be called to schedule and aware of insurance information . Questions addressed.  Encouraged to complete her weight loss journey.  Labs: Hep C, Lipid Panel, CMP, Vitamin D, Hgb A1-C  Pap smear: no   counseled on breast self exam, mammography screening, feminine hygiene, adequate intake of calcium and vitamin D, diet and exercise  return annually or prn  An After Visit Summary was printed and given to the patient.

## 2017-03-08 ENCOUNTER — Ambulatory Visit
Admission: RE | Admit: 2017-03-08 | Discharge: 2017-03-08 | Disposition: A | Discharge status: Home / Self Care | Payer: BLUE CROSS/BLUE SHIELD | Source: Ambulatory Visit | Attending: Orthopaedic Surgery | Admitting: Orthopaedic Surgery

## 2017-03-08 DIAGNOSIS — G8929 Other chronic pain: Secondary | ICD-10-CM

## 2017-03-08 DIAGNOSIS — M25512 Pain in left shoulder: Principal | ICD-10-CM

## 2017-03-08 LAB — LIPID PANEL
Chol/HDL Ratio: 3.7 ratio (ref 0.0–4.4)
Cholesterol, Total: 227 mg/dL — ABNORMAL HIGH (ref 100–199)
HDL: 62 mg/dL (ref >39)
LDL CALC: 111 mg/dL — AB (ref 0–99)
Triglycerides: 272 mg/dL — ABNORMAL HIGH (ref 0–149)
VLDL CHOLESTEROL CAL: 54 mg/dL — AB (ref 5–40)

## 2017-03-08 LAB — COMPREHENSIVE METABOLIC PANEL
ALBUMIN: 4.1 g/dL (ref 3.5–5.5)
ALT: 16 IU/L (ref 0–32)
AST: 19 IU/L (ref 0–40)
Albumin/Globulin Ratio: 1.6 (ref 1.2–2.2)
Alkaline Phosphatase: 93 IU/L (ref 39–117)
BUN / CREAT RATIO: 18 (ref 9–23)
BUN: 13 mg/dL (ref 6–24)
CALCIUM: 9.5 mg/dL (ref 8.7–10.2)
CO2: 24 mmol/L (ref 20–29)
CREATININE: 0.71 mg/dL (ref 0.57–1.00)
Chloride: 98 mmol/L (ref 96–106)
GFR calc non Af Amer: 99 mL/min/{1.73_m2} (ref >59)
GFR, EST AFRICAN AMERICAN: 114 mL/min/{1.73_m2} (ref >59)
GLUCOSE: 94 mg/dL (ref 65–99)
Globulin, Total: 2.5 g/dL (ref 1.5–4.5)
Potassium: 4.2 mmol/L (ref 3.5–5.2)
Sodium: 136 mmol/L (ref 134–144)
TOTAL PROTEIN: 6.6 g/dL (ref 6.0–8.5)

## 2017-03-08 LAB — HEMOGLOBIN A1C
Est. average glucose Bld gHb Est-mCnc: 117 mg/dL
Hgb A1c MFr Bld: 5.7 % — ABNORMAL HIGH (ref 4.8–5.6)

## 2017-03-08 LAB — VITAMIN D 25 HYDROXY (VIT D DEFICIENCY, FRACTURES): Vit D, 25-Hydroxy: 30.3 ng/mL (ref 30.0–100.0)

## 2017-03-08 LAB — TSH: TSH: 2.91 u[IU]/mL (ref 0.450–4.500)

## 2017-03-08 LAB — HEPATITIS C ANTIBODY

## 2017-03-09 ENCOUNTER — Other Ambulatory Visit: Payer: Self-pay | Admitting: Certified Nurse Midwife

## 2017-03-09 DIAGNOSIS — R6889 Other general symptoms and signs: Secondary | ICD-10-CM

## 2017-03-13 ENCOUNTER — Encounter (INDEPENDENT_AMBULATORY_CARE_PROVIDER_SITE_OTHER): Payer: Self-pay | Admitting: Orthopaedic Surgery

## 2017-03-13 ENCOUNTER — Other Ambulatory Visit (INDEPENDENT_AMBULATORY_CARE_PROVIDER_SITE_OTHER): Payer: Self-pay

## 2017-03-13 ENCOUNTER — Ambulatory Visit (INDEPENDENT_AMBULATORY_CARE_PROVIDER_SITE_OTHER): Payer: BLUE CROSS/BLUE SHIELD | Admitting: Orthopaedic Surgery

## 2017-03-13 VITALS — BP 158/96 | HR 91 | Resp 14 | Ht 62.0 in | Wt 199.0 lb

## 2017-03-13 DIAGNOSIS — G8929 Other chronic pain: Secondary | ICD-10-CM | POA: Diagnosis not present

## 2017-03-13 DIAGNOSIS — M25512 Pain in left shoulder: Secondary | ICD-10-CM | POA: Diagnosis not present

## 2017-03-13 NOTE — Progress Notes (Signed)
Office Visit Note   Patient: [name]           Date of Birth: 04-06-1966           MRN: [id number] Visit Date: 03/13/2017              Requested by: No referring provider defined for this encounter. PCP: System, Pcp Not In   Assessment & Plan: Visit Diagnoses:  1. Chronic left shoulder pain   MRI scan demonstrates partial bursal surface tearing of the supraspinatus.  Plan: long discussion regarding findings on MRI scan.Has had subacromial cortisone injection with limited relief. We will try physical therapy and return in 4-6 weeks if no improvement. Discuss the results and given patient copy of scan  Follow-Up Instructions: Return if symptoms worsen or fail to improve.   Orders:  No orders of the defined types were placed in this encounter.  No orders of the defined types were placed in this encounter.     Procedures: No procedures performed   Clinical Data: No additional findings.   Subjective: Chief Complaint  Patient presents with  . Left Shoulder - Results    [name] is a 51 y o that is here today for Left shoulder MRI results  history of impingement syndrome left shoulder without much relief with cortisone. Has given this time and anti-inflammatory medicines. Thus, the reason for the MRI scan. Scan demonstrates partial bursal surface tearing of the supraspinatus. Mild degenerative changes at the acromioclavicular joint.Has had prior rotator cuff tear surgery in 2009 right shoulder with good results.  HPI  Review of Systems  Constitutional: Negative for chills, fatigue and fever.  Eyes: Negative for itching.  Respiratory: Negative for chest tightness and shortness of breath.   Cardiovascular: Negative for chest pain, palpitations and leg swelling.  Gastrointestinal: Negative for blood in stool, constipation and diarrhea.  Endocrine: Negative for polyuria.  Genitourinary: Negative for dysuria.  Musculoskeletal: Negative for back pain, joint  swelling, neck pain and neck stiffness.  Allergic/Immunologic: Negative for immunocompromised state.  Neurological: Negative for dizziness and numbness.  Hematological: Does not bruise/bleed easily.  Psychiatric/Behavioral: The patient is not nervous/anxious.      Objective: Vital Signs: BP (!) 158/96   Pulse 91   Resp 14   Ht  (1.575 m)   Wt 199 lb (90.3 kg)   BMI 36.40 kg/m   Physical Exam  Ortho Examfull overhead range of motion left shoulder. No loss of internal or external rotation. Positive impingement and minimally positive empty can testing. Skin intact. Local tenderness over the acromioclavicular joint and  subacromial region.neurovascular exam intact. No pain with range of motion of cervical spine.  Specialty Comments:  No specialty comments available.  Imaging: No results found.   PMFS History: There are no active problems to display for this patient.  Past Medical History:  Diagnosis Date  . Hypercholesteremia   . Kidney stones   . Migraines   . Subclinical hypothyroidism 4/11    Family History  Problem Relation Age of Onset  . Diabetes Mother   . Cancer Paternal Grandmother        colon    Past Surgical History:  Procedure Laterality Date  . LITHOTRIPSY    . NOVASURE ABLATION  12-18-06  . ROTATOR CUFF REPAIR Right 2009   Social History   Occupational History  . Not on file.   Social History Main Topics  . Smoking status: Never Smoker  . Smokeless tobacco: Never Used  .  Alcohol use No  . Drug use: No  . Sexual activity: Yes    Partners: Male    Birth control/ protection: Surgical     Comment: Ablation, Vasectomy

## 2017-03-15 ENCOUNTER — Telehealth: Payer: Self-pay | Admitting: *Deleted

## 2017-03-15 MED ORDER — ALPRAZOLAM 0.25 MG PO TABS: ORAL_TABLET | ORAL | 0 refills | Status: DC

## 2017-03-15 NOTE — Telephone Encounter (Signed)
Call to patient to f/u with scheduling vulvar biopsy, per Deborah LeonLeota SauersSpoke with patient. Patient request first available appointment with first available provider. Patient tearful and states "ready to get this over with". Scheduled for 10/1 at 10:30am with Dr. Edward Jolly. Patient requesting RX for anxiety for day of procedure, has taken xanax in the past.   Advised patient if provider agreeable, would need to have a driver day of procedure and would need to come in to office prior to day of procedure to sign consent. Patient verbalizes understanding and is agreeable.  Advised patient would review with provider and return call.   Dr. Edward Jolly -please advise on RX for anxiety?  Cc: Leota Sauers, CNM

## 2017-03-15 NOTE — Telephone Encounter (Signed)
Spoke with patient, advised as seen below per Dr. Edward Jolly. Patient aware to come to office to pick up prescription and sign consent. Patient verbalizes understanding and is agreeable.   Call transferred to [facility] for review of benefits.  Printed Rx for Xanax  to Dr. Edward Jolly for signature.  Routing to provider for final review. Patient is agreeable to disposition. Will close encounter.   Cc: Harland Dingwall

## 2017-03-15 NOTE — Telephone Encounter (Signed)
Ok for Xanax 0.25 mg, take 1 - 2 thirty minutes prior to procedure. Dispense: 2, RF none.   Needs to sign consent prior to taking medication.

## 2017-03-16 NOTE — Progress Notes (Signed)
Subjective:     Patient ID: [name], female   DOB: 1966-05-21, 51 y.o.   MRN: [id number]  HPI  Patient here today for vulvar biopsy. She did not take Xanax prior to appt today.  Found the area a couple months ago.  It is itching.   States she had a biopsy years ago with Dr. Lily Peer and this may have been wart.   Review of Systems  LMP: Ablation  Contraception: Vasectomy     Objective:   Physical Exam  Genitourinary:      Vulvar biopsies. Consent for procedure.  3% acetic acid used.  Right labia majora with 1 cm raised cobble stone lesion.  Left labia majora with subtle cobble stone change over 2 cm x 5 mm area. Biopsy of each site taken with 3 mm punch after local 1% lidocaine, lot [id number], exp 06/2018. Each to path separately.  Single 3/0 Vicryl suture for each bx site.  Minimal EBL.  No complications.     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  Plan:     We discussed possibility for the vulvar lesions - HPV changes, condyloma, dysplasia. Post biopsy instructions given.  Follow up in 10 - 14 days for removal of sutures.  After visit summary to patient.  ___10____ minutes face to face time of which over 50% was spent in counseling.

## 2017-03-19 ENCOUNTER — Ambulatory Visit (INDEPENDENT_AMBULATORY_CARE_PROVIDER_SITE_OTHER): Payer: BLUE CROSS/BLUE SHIELD | Admitting: Obstetrics and Gynecology

## 2017-03-19 ENCOUNTER — Encounter: Payer: Self-pay | Admitting: Obstetrics and Gynecology

## 2017-03-19 DIAGNOSIS — N9089 Other specified noninflammatory disorders of vulva and perineum: Secondary | ICD-10-CM

## 2017-03-19 NOTE — Patient Instructions (Signed)

## 2017-03-22 ENCOUNTER — Ambulatory Visit: Payer: BLUE CROSS/BLUE SHIELD | Attending: Orthopaedic Surgery

## 2017-03-22 ENCOUNTER — Ambulatory Visit: Payer: Self-pay | Admitting: Obstetrics and Gynecology

## 2017-03-22 DIAGNOSIS — R293 Abnormal posture: Secondary | ICD-10-CM

## 2017-03-22 DIAGNOSIS — G8929 Other chronic pain: Secondary | ICD-10-CM | POA: Diagnosis present

## 2017-03-22 DIAGNOSIS — M25512 Pain in left shoulder: Secondary | ICD-10-CM | POA: Insufficient documentation

## 2017-03-22 NOTE — Patient Instructions (Signed)
Strengthening: Resisted Internal Rotation   Hold tubing in left hand, elbow at side and forearm out. Rotate forearm in across body. Repeat ____ times per set. Do ____ sets per session. Do ____ sessions per day.  http://orth.exer.us/830   Copyright  VHI. All rights reserved.  Strengthening: Resisted External Rotation   Hold tubing in right hand, elbow at side and forearm across body. Rotate forearm out. Repeat ____ times per set. Do ____ sets per session. Do ____ sessions per day.  http://orth.exer.us/828   Copyright  VHI. All rights reserved.  Strengthening: Resisted Flexion   Hold tubing with left arm at side. Pull forward and up. Move shoulder through pain-free range of motion. Repeat ____ times per set. Do ____ sets per session. Do ____ sessions per day.  http://orth.exer.us/824   Copyright  VHI. All rights reserved.  Strengthening: Resisted Extension   Hold tubing in right hand, arm forward. Pull arm back, elbow straight. Repeat ____ times per set. Do ____ sets per session. Do ____ sessions per day.  http://orth.exer.us/832   Copyright  VHI. All rights reserved.  ALL ABOVE 10-15 REPS  NO PAIN

## 2017-03-22 NOTE — Therapy (Addendum)
[facility] [address] [place], [place], [address] Phone: [phone]   Fax:  [phone]  Physical Therapy Evaluation  Patient Details  Name: [name] MRN: [id number] Date of Birth: 05/24/1966 Referring Provider: Norlene Campbell, MD  Encounter Date: 03/22/2017      PT End of Session - 03/22/17 0826    Visit Number 1   Number of Visits 6   Date for PT Re-Evaluation 05/04/17   Authorization Type BCBS   PT Start Time [phone]   PT Stop Time 0830   PT Time Calculation (min) 38 min   Activity Tolerance Patient tolerated treatment well   Behavior During Therapy [facility] for tasks assessed/performed      Past Medical History:  Diagnosis Date  . Hypercholesteremia   . Kidney stones   . Migraines   . Subclinical hypothyroidism 4/11    Past Surgical History:  Procedure Laterality Date  . LITHOTRIPSY    . NOVASURE ABLATION  12-18-06  . ROTATOR CUFF REPAIR Right 2009    There were no vitals filed for this visit.       Subjective Assessment - 03/22/17 0753    Subjective Lt shoulder pain for 8 month worse but also years before.  MD said could clean out .  PT for strength.  Not point for surgery   Pertinent History No injury. RT shoulder RTC repair.    Limitations Lifting  using arm over head   How long can you sit comfortably? As needed   How long can you stand comfortably? As needed   How long can you walk comfortably? As needecd   Diagnostic tests MRI : Said no tears , fraying.    Patient Stated Goals To have less pain and use arm over head   Currently in Pain? No/denies  pain with use   Pain Location Shoulder   Pain Orientation Left   Pain Descriptors / Indicators Sharp  feels like ripping apart   Pain Type Chronic pain;Acute pain   Pain Onset More than a month ago   Pain Frequency Intermittent   Aggravating Factors  using arm overhead   Pain Relieving Factors not reaching ovehead   Multiple Pain Sites Yes             [facility] PT Assessment - 03/22/17 0001      Assessment   Medical Diagnosis  LT shoulder pain   Referring Provider Norlene Campbell, MD   Onset Date/Surgical Date --  chronic pain worse in past 8 moinths   Hand Dominance Right   Next MD Visit As needed   Prior Therapy Not for LT shoulder     Precautions   Precautions None     Restrictions   Weight Bearing Restrictions No     Balance Screen   Has the patient fallen in the past 6 months No   Has the patient had a decrease in activity level because of a fear of falling?  No   Is the patient reluctant to leave their home because of a fear of falling?  No     Prior Function   Level of Independence Independent   Vocation Full time employment   Vocation Requirements No impact on work with shoulder pain      Cognition   Overall Cognitive Status Within Functional Limits for tasks assessed     Observation/Other Assessments   Focus on Therapeutic Outcomes (FOTO) 38% limited 38% limited     Posture/Postural Control  Posture Comments rounded shoulders     ROM / Strength   AROM / PROM / Strength AROM;PROM;Strength     AROM   AROM Assessment Site Shoulder   Right/Left Shoulder Right;Left   Right Shoulder Flexion 155 Degrees   Right Shoulder ABduction 165 Degrees   Right Shoulder Internal Rotation 70 Degrees   Right Shoulder External Rotation 90 Degrees   Right Shoulder Horizontal ABduction 20 Degrees   Right Shoulder Horizontal  ADduction 110 Degrees   Left Shoulder Flexion 146 Degrees   Left Shoulder ABduction 148 Degrees   Left Shoulder Internal Rotation 70 Degrees   Left Shoulder External Rotation 90 Degrees   Left Shoulder Horizontal ABduction 15 Degrees   Left Shoulder Horizontal ADduction 105 Degrees     PROM   PROM Assessment Site Shoulder   Right/Left Shoulder Left     Strength   Overall Strength Comments WNL bilaterally with some tenderness with abduction on LT    Strength Assessment Site Shoulder    Right/Left Shoulder Right;Left            Objective measurements completed on examination: See above findings.                  PT Education - 03/22/17 0825    Education provided Yes   Education Details poc , hep   Person(s) Educated Patient   Methods Explanation;Demonstration;Verbal cues;Handout   Comprehension Returned demonstration;Verbalized understanding          PT Short Term Goals - 03/22/17 0829      PT SHORT TERM GOAL #1   Title she will be independent with all initial HEP without pain   Time 3   Period Weeks   Status New           PT Long Term Goals - 03/22/17 1610      PT LONG TERM GOAL #1   Title She will be independent with all hEP issued   Time 6   Period Weeks   Status New     PT LONG TERM GOAL #2   Title She will report able to use Lt arm ovehead with 1-2 max pain to access shelving and closets.    Time 6   Period Weeks   Status New                Plan - 03/22/17 0826    Clinical Impression Statement Ms Hitch complans of Lt shoulder pain with overhead use and quick reaching  with Lt arm.  She has no pain otherwise and was able to use the red theraband without pain.    Clinical Presentation Stable   Clinical Decision Making Low   Rehab Potential Good   PT Frequency 1x / week   PT Duration 6 weeks   PT Treatment/Interventions Iontophoresis /ml Dexamethasone;Ultrasound;Therapeutic exercise;Patient/family education;Manual techniques;Taping   PT Next Visit Plan review HEP , Increase resistance , taping for posture     PT Home Exercise Plan [place]   Consulted and Agree with Plan of Care Patient      Patient will benefit from skilled therapeutic intervention in order to improve the following deficits and impairments:  Postural dysfunction, Decreased activity tolerance, Impaired UE functional use, Pain  Visit Diagnosis: Chronic left shoulder pain - Plan: PT plan of care cert/re-cert  Abnormal posture - Plan: PT  plan of care cert/re-cert     Problem List There are no active problems to display for this patient.   Caprice Red  PT 03/22/2017, 1:25 PM  [facility] [address] [place], [place], [address] Phone: [phone]   Fax:  [phone]  Name: EMME ROSENAU MRN: [id number] Date of Birth: 1965-08-15

## 2017-03-23 ENCOUNTER — Ambulatory Visit (INDEPENDENT_AMBULATORY_CARE_PROVIDER_SITE_OTHER): Payer: BLUE CROSS/BLUE SHIELD | Admitting: Orthopaedic Surgery

## 2017-03-26 NOTE — Progress Notes (Signed)
GYNECOLOGY  VISIT   HPI: 51 y.o.   Married  Caucasian  female   G2P2002 with No LMP recorded. Patient has had an ablation.   here for follow up from vulvar biopsy and removal of sutures.   Has been taking Advil for the last year due to tooth pain.  Denies mouth lesions.  Having care to adjust her bite and did a root canal. Can take them before going to bed.   GYNECOLOGIC HISTORY: No LMP recorded. Patient has had an ablation. Contraception: Vasectomy Menopausal hormone therapy:  n/a Last mammogram: 11-01-16 3D Density B/Neg/BiRads1:[facility] Last pap smear:  11-26-14 Neg        OB History    Gravida Para Term Preterm AB Living   SAB TAB Ectopic Multiple Live Births                     There are no active problems to display for this patient.   Past Medical History:  Diagnosis Date  . Hypercholesteremia   . Kidney stones   . Migraines   . Subclinical hypothyroidism 4/11    Past Surgical History:  Procedure Laterality Date  . LITHOTRIPSY    . NOVASURE ABLATION  12-18-06  . ROTATOR CUFF REPAIR Right 2009    Current Outpatient Prescriptions  Medication Sig Dispense Refill  . MILK THISTLE PO Take by mouth as needed.    . Omega-3 Fatty Acids (FISH OIL PO) Take by mouth daily.    . vitamin E 200 UNIT capsule Take 200 Units by mouth daily.     No current facility-administered medications for this visit.      ALLERGIES: Zanaflex [tizanidine hcl] and Biaxin [clarithromycin]  Family History  Problem Relation Age of Onset  . Diabetes Mother   . Cancer Paternal Grandmother        colon    Social History   Social History  . Marital status: Married    Spouse name: N/A  . Number of children: N/A  . Years of education: N/A   Occupational History  . Not on file.   Social History Main Topics  . Smoking status: Never Smoker  . Smokeless tobacco: Never Used  . Alcohol use No  . Drug use: No  . Sexual activity: Yes    Partners: Male    Birth control/  protection: Surgical     Comment: Ablation, Vasectomy   Other Topics Concern  . Not on file   Social History Narrative  . No narrative on file    ROS:  Pertinent items are noted in HPI.  PHYSICAL EXAMINATION:    BP 110/88 (BP Location: Right Arm, Patient Position: Sitting, Cuff Size: Large)   Pulse 64   Ht 5' 1.75" (1.568 m)   Wt 198 lb (89.8 kg)   BMI 36.51 kg/m     General appearance: alert, cooperative and appears stated age  Pelvic: External genitalia:  Hypopigmented areas of both labia majora noted.  Sutures removed. Skin well healed.   ASSESSMENT  Lichen planus of the vulva.  PLAN  We reviewed lichen planus in Up to Date and did a full discussion.  We talked about this being self limiting, but that it may last for 1 - 2 years. Stop or limit NSAIDs.  Betamethasone 0.1% to areas bid for one month.  Anithistamines for pruritis prn. Return to do recheck in 4 weeks.    An After Visit Summary  was printed and given to the patient.  __15____ minutes face to face time of which over 50% was spent in counseling.

## 2017-03-28 ENCOUNTER — Encounter: Payer: Self-pay | Admitting: Obstetrics and Gynecology

## 2017-03-28 ENCOUNTER — Ambulatory Visit (INDEPENDENT_AMBULATORY_CARE_PROVIDER_SITE_OTHER): Payer: BLUE CROSS/BLUE SHIELD | Admitting: Obstetrics and Gynecology

## 2017-03-28 VITALS — BP 110/88 | HR 64 | Ht 61.75 in | Wt 198.0 lb

## 2017-03-28 DIAGNOSIS — L439 Lichen planus, unspecified: Secondary | ICD-10-CM | POA: Diagnosis not present

## 2017-03-28 MED ORDER — BETAMETHASONE VALERATE 0.1 % EX OINT
1.0000 | TOPICAL_OINTMENT | Freq: Two times a day (BID) | CUTANEOUS | 0 refills | Status: DC
Start: 2017-03-28 — End: 2019-03-19

## 2017-03-28 NOTE — Patient Instructions (Signed)
Use the betamethasone ointment twice a day for one month.  Apply in a thin layer to the affected areas. Call for any problems.

## 2017-03-29 ENCOUNTER — Ambulatory Visit: Payer: BLUE CROSS/BLUE SHIELD

## 2017-03-29 DIAGNOSIS — G8929 Other chronic pain: Secondary | ICD-10-CM

## 2017-03-29 DIAGNOSIS — M25512 Pain in left shoulder: Principal | ICD-10-CM

## 2017-03-29 DIAGNOSIS — R293 Abnormal posture: Secondary | ICD-10-CM

## 2017-03-29 NOTE — Patient Instructions (Signed)

## 2017-03-29 NOTE — Therapy (Addendum)
[facility] [address] [place], [place], [address] Phone: [phone]   Fax:  [phone]  Physical Therapy Treatment  Patient Details  Name: [name] MRN: [id number] Date of Birth: Nov 03, 1965 Referring Provider: Norlene Campbell, MD  Encounter Date: 03/29/2017      PT End of Session - 03/29/17 0738    Visit Number 2   Number of Visits 6   Date for PT Re-Evaluation 05/04/17   Authorization Type BCBS   PT Start Time 0703   PT Stop Time 0741   PT Time Calculation (min) 38 min   Activity Tolerance Patient tolerated treatment well   Behavior During Therapy [facility] for tasks assessed/performed      Past Medical History:  Diagnosis Date  . Hypercholesteremia   . Kidney stones   . Migraines   . Subclinical hypothyroidism 4/11    Past Surgical History:  Procedure Laterality Date  . LITHOTRIPSY    . NOVASURE ABLATION  12-18-06  . ROTATOR CUFF REPAIR Right 2009    There were no vitals filed for this visit.      Subjective Assessment - 03/29/17 0713    Subjective No pain today. No pain with band exercises.    Currently in Pain? No/denies                         [facility] Adult PT Treatment/Exercise - 03/29/17 0001      Exercises   Exercises   [place]wood review and sets with red and green band discussed body position with pull of band.  Shoulder     Modalities   Modalities Ultrasound     Ultrasound   Ultrasound Location LT shoulder    Ultrasound Parameters 100% 1.2 w/cm2  with large head   Ultrasound Goals Pain     Manual Therapy   Manual Therapy Soft tissue mobilization   Soft tissue mobilization to LT shoulder anterior posterior lateral and traps supraspinatus     STW with tool   Ionto 1cc dexamethasone  Posterior LT shoulder        PT Education - 03/29/17 0743    Education provided Yes   Education Details ionto wear time and removal   Person(s) Educated Patient   Methods  Explanation   Comprehension Verbalized understanding          PT Short Term Goals - 03/22/17 0829      PT SHORT TERM GOAL #1   Title she will be independent with all initial HEP without pain   Time 3   Period Weeks   Status New           PT Long Term Goals - 03/22/17 1324      PT LONG TERM GOAL #1   Title She will be independent with all hEP issued   Time 6   Period Weeks   Status New     PT LONG TERM GOAL #2   Title She will report able to use Lt arm ovehead with 1-2 max pain to access shelving and closets.    Time 6   Period Weeks   Status New               Plan - 03/29/17 0741    Clinical Impression Statement She is doing well with exercises tolerating increased resistance.   Tender in soft tissue around shoulder more posterior.   No pain post.    PT Treatment/Interventions Iontophoresis /ml Dexamethasone;Ultrasound;Therapeutic  exercise;Patient/family education;Manual techniques;Taping   PT Next Visit Plan review HEP , Increase resistance , taping for posture     PT Home Exercise Plan [place]   Consulted and Agree with Plan of Care Patient      Patient will benefit from skilled therapeutic intervention in order to improve the following deficits and impairments:  Postural dysfunction, Decreased activity tolerance, Impaired UE functional use, Pain  Visit Diagnosis: Chronic left shoulder pain  Abnormal posture     Problem List There are no active problems to display for this patient.   Caprice Red  PT 03/29/2017, 7:46 AM  [facility] [address] [place], [place], [address] Phone: [phone]   Fax:  [phone]  Name: [name] MRN: [id number] Date of Birth: 05/31/1966

## 2017-04-05 ENCOUNTER — Ambulatory Visit: Payer: BLUE CROSS/BLUE SHIELD

## 2017-04-05 DIAGNOSIS — M25512 Pain in left shoulder: Secondary | ICD-10-CM | POA: Diagnosis not present

## 2017-04-05 DIAGNOSIS — R293 Abnormal posture: Secondary | ICD-10-CM

## 2017-04-05 DIAGNOSIS — G8929 Other chronic pain: Secondary | ICD-10-CM

## 2017-04-05 NOTE — Therapy (Signed)
[facility] [address] [place], [place], [address] Phone: [phone]   Fax:  [phone]  Physical Therapy Treatment  Patient Details  Name: [name] MRN: [id number] Date of Birth: 10/15/1965 Referring Provider: Peter Whitfield, MD  Encounter Date: 04/05/2017      PT End of Session - 04/05/17 0704    Visit Number 3   Number of Visits 6   Date for PT Re-Evaluation 05/04/17   Authorization Type BCBS   PT Start Time 0705   PT Stop Time 0745   PT Time Calculation (min) 40 min   Activity Tolerance Patient tolerated treatment well   Behavior During Therapy [facility] for tasks assessed/performed      Past Medical History:  Diagnosis Date  . Hypercholesteremia   . Kidney stones   . Migraines   . Subclinical hypothyroidism 4/11    Past Surgical History:  Procedure Laterality Date  . LITHOTRIPSY    . NOVASURE ABLATION  12-18-06  . ROTATOR CUFF REPAIR Right 2009    There were no vitals filed for this visit.      Subjective Assessment - 04/05/17 0707    Subjective No pain today. Green band was harder but OK. Did not do HEP as weather disrupted life   Currently in Pain? No/denies            [facility] PT Assessment - 04/05/17 0001      AROM   Left Shoulder Flexion 158 Degrees   Left Shoulder ABduction 155 Degrees  some discomfort on descent   Left Shoulder Horizontal ABduction 20 Degrees   Left Shoulder Horizontal ADduction 108 Degrees                     [facility] Adult PT Treatment/Exercise - 04/05/17 0001      Shoulder Exercises: Standing   Other Standing Exercises Rock wood green  x 10     Shoulder Exercises: ROM/Strengthening   UBE (Upper Arm Bike) 4 min L1     Modalities   Modalities Iontophoresis     Ultrasound   Ultrasound Location Lt shoulder    Ultrasound Parameters 100% <MEASUREMESt Nicholas Hosp3American FinanciDel37Medical BuMaryl [facility]5American FinanciDel53[place] BuMaryl [facility]5American FinanciDel59St. BernardBuMaryl [facility](7American FinanciDel76The Portland ClinicBuMaryl [facility] Ri2American FinanciDel50Medical CBuMaryl [facility](2American FinanciDel21HBuMaryl [facility] Ce8American FinanciDel20Huey P. LonBuMaryl [facility](30American FinanciDel68[place] SBuMaryl [facility]7American FinanciDel35St[facility] Dominican HospitalBuMaryl Philh(8American FinanciDel67BuMaryl [facility] Surgery CenteAmerican FinanciDel55[facility] BuMaryl Isurgery8American FinanciDel10[place] MBuMaryl [facility]um[facility] Ce4American FinanciDel68[place] SBuMaryl [facility]8American FinanciDel34ConchoBuMaryl [facility](3American FinanciDel14Coaling[place] RegionaBuMaryl [facility] Ce(91American FinanciDel53[facility] BuMaryl [facility]3American FinanciDel9[place] BuMaryl [facility](2American FinanciDel96Surgery CenterBuMaryl [facility] Springf(4American FinanciDel29[place]BuMaryl [facility]7American FinanciDel56[place] BuMaryl Centrac[facility] Health PaynesAmerican FinanciDel13MargBuMaryl [facility]csd-[facility] Sally [facility] Thornton HosAmerican FinanciDel9[place] SuBuMaryl [facility]2American FinanciDel64UBuMarylaMagdalene River  Iontophoresis   Type of Iontophoresis Dexamethasone   Location post LT shoulder   Dose 1cc    Time 4-6 hours     Manual Therapy   Soft tissue mobilization to LT shoulder anterior posterior lateral and traps supraspinatus                PT Education - 04/05/17 0739    Education provided Yes   Education Details Reminded of ionto wear time and removal if skin irritated          PT Short Term Goals - 04/05/17 0707      PT SHORT TERM GOAL #1   Title she will be independent with all initial HEP without pain   Status Achieved           PT Long Term Goals - 04/05/17 0707      PT LONG TERM GOAL #1   Title She will be independent with all hEP issued   Status On-going     PT LONG TERM GOAL #2   Title She will report able to use Lt arm ovehead with 1-2 max pain to access shelving and closets.    Status On-going               Plan - 04/05/17 0738  Clinical Impression Statement AROM improved and tolerating green band with exercise. Still having pain with reaching forward / moving clothes in closet with LT arm.    PT Treatment/Interventions Iontophoresis 4mg /ml Dexamethasone;Ultrasound;Therapeutic exercise;Patient/family education;Manual techniques;Taping   PT Next Visit Plan review HEP , Increase resistance , taping for posture     PT Home Exercise Plan [place]   Consulted and Agree with Plan of Care Patient      Patient will benefit from skilled therapeutic intervention in order to improve the following deficits and impairments:  Postural dysfunction, Decreased activity tolerance, Impaired UE functional use, Pain  Visit Diagnosis: Chronic left shoulder pain  Abnormal posture     Problem List There are no active problems to display for this patient.   Caprice RedChasse, Carmon Sahli M  PT 04/05/2017, 7:40 AM  [facility] [address] [place], [place], [address] Phone: [phone]   Fax:  [phone]  Name: Chrys RacerSandra A Runnion MRN: [id number] Date of Birth: 05/14/1966

## 2017-04-12 ENCOUNTER — Ambulatory Visit: Payer: BLUE CROSS/BLUE SHIELD

## 2017-04-12 DIAGNOSIS — M25512 Pain in left shoulder: Secondary | ICD-10-CM | POA: Diagnosis not present

## 2017-04-12 DIAGNOSIS — G8929 Other chronic pain: Secondary | ICD-10-CM

## 2017-04-12 DIAGNOSIS — R293 Abnormal posture: Secondary | ICD-10-CM

## 2017-04-12 NOTE — Therapy (Addendum)
[facility] [place], [place], [address] Phone: [phone]   Fax:  [phone]  Physical Therapy Treatment/Discharge  Patient Details  Name: [name] MRN: [id number] Date of Birth: 1966-04-27 Referring Provider: Joni Fears, MD  Encounter Date: 04/12/2017      PT End of Session - 04/12/17 0701    Visit Number 4   Number of Visits 6   Date for PT Re-Evaluation 05/04/17   PT Start Time 0700   PT Stop Time 2706   PT Time Calculation (min) 44 min   Activity Tolerance Patient tolerated treatment well   Behavior During Therapy [facility] for tasks assessed/performed      Past Medical History:  Diagnosis Date  . Hypercholesteremia   . Kidney stones   . Migraines   . Subclinical hypothyroidism 4/11    Past Surgical History:  Procedure Laterality Date  . LITHOTRIPSY    . NOVASURE ABLATION  12-18-06  . ROTATOR CUFF REPAIR Right 2009    There were no vitals filed for this visit.      Subjective Assessment - 04/12/17 0739    Subjective No pain . Feeling better and able to reach with less pain but still have some pain.     Currently in Pain? No/denies                         [facility] Adult PT Treatment/Exercise - 04/12/17 0001      Shoulder Exercises: Standing   Other Standing Exercises Rock wood green  x 15     Shoulder Exercises: ROM/Strengthening   UBE (Upper Arm Bike) 5 min L1     Modalities   Modalities Iontophoresis     Ultrasound   Ultrasound Location Lt shoulder    Ultrasound Parameters 100% 1Mhz 1.2 Wcm2   Ultrasound Goals Pain     Iontophoresis   Location post LT shoulder   Dose 1cc   Time 4-6 hours     Manual Therapy   Soft tissue mobilization to LT shoulder anterior posterior lateral and traps supraspinatus                PT Education - 04/12/17 0740    Education provided Yes   Education Details issued and asked toincr reps with green band and tryu blue  band with 10 reps and progrress as tolerated. She will start lifting light weight overhead to test pain levels and incr weight if tolerated.    Person(s) Educated Patient   Methods Explanation   Comprehension Verbalized understanding          PT Short Term Goals - 04/05/17 0707      PT SHORT TERM GOAL #1   Title she will be independent with all initial HEP without pain   Status Achieved           PT Long Term Goals - 04/12/17 0743      PT LONG TERM GOAL #1   Title She will be independent with all hEP issued   Status Partially Met     PT LONG TERM GOAL #2   Title She will report able to use Lt arm ovehead with 1-2 max pain to access shelving and closets.    Baseline improveing but not met   Status Partially Met               Plan - 04/12/17 0701    Clinical Impression Statement Improved but still with  pain reaching. she feels she may be able to handle and progress at home so I agreed to put her on hold and she will contact me or just not return in 2 weeks if wants discharge    PT Treatment/Interventions Iontophoresis 44m/ml Dexamethasone;Ultrasound;Therapeutic exercise;Patient/family education;Manual techniques;Taping   PT Next Visit Plan review HEP , Increase resistance , ionto  if returns   PT Home Exercise Plan [place]   Consulted and Agree with Plan of Care Patient      Patient will benefit from skilled therapeutic intervention in order to improve the following deficits and impairments:  Postural dysfunction, Decreased activity tolerance, Impaired UE functional use, Pain  Visit Diagnosis: Chronic left shoulder pain  Abnormal posture     Problem List There are no active problems to display for this patient.   CDarrel Hoover PT 04/12/2017, 7:44 AM  [facility] [address]Phone: [phone]  Fax:  [phone] Name: Taylor HECKARTMRN: [id number] of  Birth: [birth year] PHYSICAL THERAPY DISCHARGE SUMMARY  Visits from Start of Care: 4  Current functional level related to goals / functional outcomes: See above  Remaining deficits: See above   Education / Equipment: HEP Plan:                                                    Patient goals were partially met. Patient is being discharged due to not returning since the last visit.  ?????    SNoralee Stain  PT  05/22/17

## 2017-04-25 ENCOUNTER — Ambulatory Visit: Payer: BLUE CROSS/BLUE SHIELD | Admitting: Obstetrics and Gynecology

## 2017-04-25 ENCOUNTER — Other Ambulatory Visit: Payer: Self-pay

## 2017-04-25 ENCOUNTER — Encounter: Payer: Self-pay | Admitting: Obstetrics and Gynecology

## 2017-04-25 VITALS — BP 120/84 | HR 80 | Ht 61.75 in | Wt 198.0 lb

## 2017-04-25 DIAGNOSIS — N939 Abnormal uterine and vaginal bleeding, unspecified: Secondary | ICD-10-CM

## 2017-04-25 DIAGNOSIS — L439 Lichen planus, unspecified: Secondary | ICD-10-CM

## 2017-04-25 NOTE — Progress Notes (Signed)
GYNECOLOGY  VISIT   HPI: 51 y.o.   Married  Caucasian  female   G2P2002 with No LMP recorded. Patient has had an ablation.   here for follow up.  Her vulva is not itching any more.   Stopped using the Valisone.   Had an ablation in 2008.  She usually has bleeding 2 - 3 times a year for one day, and then it stops.   Had more significant bleeding with wiping on 04/14/17.  No hot flashes or night sweats.  FSH 15.6 on 12/13/16.   GYNECOLOGIC HISTORY: No LMP recorded. Patient has had an ablation. Contraception:  vasectomy Menopausal hormone therapy:  n/a Last mammogram:  11-01-16 3D Density B/Neg/BiRads1:[facility] Last pap smear: 11-26-14 Neg        OB History    Gravida Para Term Preterm AB Living   2 2 2     2    SAB TAB Ectopic Multiple Live Births                     There are no active problems to display for this patient.   Past Medical History:  Diagnosis Date  . Hypercholesteremia   . Kidney stones   . Migraines   . Subclinical hypothyroidism 4/11    Past Surgical History:  Procedure Laterality Date  . LITHOTRIPSY    . NOVASURE ABLATION  12-18-06  . ROTATOR CUFF REPAIR Right 2009    Current Outpatient Medications  Medication Sig Dispense Refill  . betamethasone valerate ointment (VALISONE) 0.1 % Apply 1 application topically 2 (two) times daily. Use for one month and then stop. 30 g 0  . MILK THISTLE PO Take by mouth as needed.    . Omega-3 Fatty Acids (FISH OIL PO) Take by mouth daily.    . vitamin E 200 UNIT capsule Take 200 Units by mouth daily.     No current facility-administered medications for this visit.      ALLERGIES: Zanaflex [tizanidine hcl] and Biaxin [clarithromycin]  Family History  Problem Relation Age of Onset  . Diabetes Mother   . Cancer Paternal Grandmother        colon    Social History   Socioeconomic History  . Marital status: Married    Spouse name: Not on file  . Number of children: Not on file  . Years of education: Not  on file  . Highest education level: Not on file  Social Needs  . Financial resource strain: Not on file  . Food insecurity - worry: Not on file  . Food insecurity - inability: Not on file  . Transportation needs - medical: Not on file  . Transportation needs - non-medical: Not on file  Occupational History  . Not on file  Tobacco Use  . Smoking status: Never Smoker  . Smokeless tobacco: Never Used  Substance and Sexual Activity  . Alcohol use: No    Alcohol/week: 0.0 oz  . Drug use: No  . Sexual activity: Yes    Partners: Male    Birth control/protection: Surgical    Comment: Ablation, Vasectomy  Other Topics Concern  . Not on file  Social History Narrative  . Not on file    ROS:  Pertinent items are noted in HPI.  PHYSICAL EXAMINATION:    BP 120/84 (BP Location: Right Arm, Patient Position: Sitting, Cuff Size: Large)   Pulse 80   Ht 5' 1.75" (1.568 m)   Wt 198 lb (89.8 kg)   BMI  36.51 kg/m     General appearance: alert, cooperative and appears stated age   Pelvic: External genitalia:  no lesions              Urethra:  normal appearing urethra with no masses, tenderness or lesions            Chaperone was present for exam.  ASSESSMENT  Lichen planus.  Symptoms resolved with valisone ointment.  Status post endometrial ablation.  Increased bleeding with last menses.  I suspect patient is  perimenopausal.  PLAN  Ok to stop valisone but resume it bid for 2 weeks with a future flare if it were to occur. Will check FSH and E2.   If labs indicate she is menopausal, she will need a pelvic US and EMB. Follow up prn.    An After Visit Summary was printed and given to the patient.  __15__ minutes face to face time of which over 50% was spent in counseling.

## 2017-04-26 LAB — ESTRADIOL: ESTRADIOL: 17.9 pg/mL

## 2017-04-26 LAB — FOLLICLE STIMULATING HORMONE: FSH: 51.2 m[IU]/mL

## 2017-05-23 ENCOUNTER — Other Ambulatory Visit: Payer: Self-pay | Admitting: *Deleted

## 2017-05-23 DIAGNOSIS — N939 Abnormal uterine and vaginal bleeding, unspecified: Secondary | ICD-10-CM

## 2017-05-23 NOTE — Progress Notes (Signed)
endom

## 2017-05-24 ENCOUNTER — Ambulatory Visit (INDEPENDENT_AMBULATORY_CARE_PROVIDER_SITE_OTHER): Payer: BLUE CROSS/BLUE SHIELD | Admitting: Obstetrics and Gynecology

## 2017-05-24 ENCOUNTER — Ambulatory Visit (INDEPENDENT_AMBULATORY_CARE_PROVIDER_SITE_OTHER): Payer: BLUE CROSS/BLUE SHIELD

## 2017-05-24 ENCOUNTER — Encounter: Payer: Self-pay | Admitting: Obstetrics and Gynecology

## 2017-05-24 ENCOUNTER — Other Ambulatory Visit: Payer: Self-pay | Admitting: Obstetrics and Gynecology

## 2017-05-24 VITALS — BP 124/82 | HR 76 | Ht 61.75 in | Wt 198.0 lb

## 2017-05-24 DIAGNOSIS — N939 Abnormal uterine and vaginal bleeding, unspecified: Secondary | ICD-10-CM

## 2017-05-24 DIAGNOSIS — N950 Postmenopausal bleeding: Secondary | ICD-10-CM

## 2017-05-24 NOTE — Progress Notes (Signed)
Patient ID: [name], female   DOB: 12/13/1965, 51 y.o.   MRN: [id number] GYNECOLOGY  VISIT   HPI: 51 y.o.   Married  Caucasian  female   G2P2002 with No LMP recorded. Patient has had an ablation.   here for pelvic ultrasound for abnormal uterine bleeding.    Copied from my office visit note on 04/25/17: Had an ablation in 2008.  She usually has bleeding 2 - 3 times a year for one day, and then it stops.   Had more significant bleeding with wiping on 04/14/17.  No hot flashes or night sweats.  FSH 15.6 on 12/13/16.    New labs done on 04/25/17: FSH 51.1 and E2 17.9 on 04/25/17.  Does feel more anxiety once a week.   GYNECOLOGIC HISTORY: No LMP recorded. Patient has had an ablation. Contraception:  Vasectomy/Ablation Menopausal hormone therapy:  none Last mammogram:  11-01-16 category b density birads 1:neg Last pap smear: 11-26-14 Neg         OB History    Gravida Para Term Preterm AB Living   2 2 2     2    SAB TAB Ectopic Multiple Live Births                     There are no active problems to display for this patient.   Past Medical History:  Diagnosis Date  . Hypercholesteremia   . Kidney stones   . Migraines   . Subclinical hypothyroidism 4/11    Past Surgical History:  Procedure Laterality Date  . LITHOTRIPSY    . NOVASURE ABLATION  12-18-06  . ROTATOR CUFF REPAIR Right 2009    Current Outpatient Medications  Medication Sig Dispense Refill  . betamethasone valerate ointment (VALISONE) 0.1 % Apply 1 application topically 2 (two) times daily. Use for one month and then stop. 30 g 0  . MILK THISTLE PO Take by mouth as needed.    . Omega-3 Fatty Acids (FISH OIL PO) Take by mouth daily.    . vitamin E 200 UNIT capsule Take 200 Units by mouth daily.     No current facility-administered medications for this visit.      ALLERGIES: Zanaflex [tizanidine hcl] and Biaxin [clarithromycin]  Family History  Problem Relation Age of Onset  . Diabetes Mother    . Cancer Paternal Grandmother        colon    Social History   Socioeconomic History  . Marital status: Married    Spouse name: Not on file  . Number of children: Not on file  . Years of education: Not on file  . Highest education level: Not on file  Social Needs  . Financial resource strain: Not on file  . Food insecurity - worry: Not on file  . Food insecurity - inability: Not on file  . Transportation needs - medical: Not on file  . Transportation needs - non-medical: Not on file  Occupational History  . Not on file  Tobacco Use  . Smoking status: Never Smoker  . Smokeless tobacco: Never Used  Substance and Sexual Activity  . Alcohol use: No    Alcohol/week: 0.0 oz  . Drug use: No  . Sexual activity: Yes    Partners: Male    Birth control/protection: Surgical    Comment: Ablation, Vasectomy  Other Topics Concern  . Not on file  Social History Narrative  . Not on file    ROS:  Pertinent items are noted  in HPI.  PHYSICAL EXAMINATION:    BP 124/82 (BP Location: Right Arm, Patient Position: Sitting, Cuff Size: Large)   Pulse 76   Ht 5' 1.75" (1.568 m)   Wt 198 lb (89.8 kg)   BMI 36.51 kg/m     General appearance: alert, cooperative and appears stated age   Pelvic US Uterus - no fibroids.  Cystic spaces consistent with possible adenomyosis. EMS - 3.29 mm. Thickening in LUS.  Ovaries - left with small follicle.  Right normal. Free fluid  - none.  Sonohysterogram/EMB Consent for procedure.  Hibiclens prep.  NS introduced into uterine cavity.  Cystic thickening in the LUS.  Difficult to differentiate due to limited distention of the cavity. EMB performed under sterile conditions with Hibiclens reprep. Pipelle passed to 6 cm twice.  Tissue to pathology.  No complications.  Minimal EBL.  Motrin 800 mg given to patient in office post procedure.   Chaperone was present for exam.  ASSESSMENT   Status post endometrial ablation.  Postmenopausal bleeding.   Technically perimenopausal [facility].  Cystic change of the lower uterine segment.   PLAN  Follow up EMB.  Instructions/precautions given.  Discussed menopause and perimenopause. We reviewed potential options following test results returning:  Observational management, attempted hysteroscopy with dilation and curettage (which may not be possible due to the ablation), and laparoscopic hysterectomy with bilateral salpingectomy.  We discussed risks of hysterectomy including bleeding, infection, damage to surrounding organs, reaction to anesthesia, pneumonia, reoperation, DVT, PE, and death. Final plan to follow.   An After Visit Summary was printed and given to the patient.  __25____ minutes face to face time of which over 50% was spent in counseling.

## 2017-05-24 NOTE — Progress Notes (Signed)
Encounter reviewed by Dr. Brook Amundson C. Silva.  

## 2017-05-24 NOTE — Patient Instructions (Signed)

## 2017-10-16 ENCOUNTER — Telehealth: Payer: Self-pay

## 2017-10-16 NOTE — Telephone Encounter (Signed)
-----   Message from Verner Chol, CNM sent at 10/16/2017  8:04 AM EDT ----- Regarding: elevated lipid panel repeat Please call regarding

## 2017-10-16 NOTE — Telephone Encounter (Signed)
Pt states she is seeing a pcp & they checked it the end of the year & they are following it for her.

## 2017-10-16 NOTE — Telephone Encounter (Signed)
Thank you :)

## 2017-11-26 ENCOUNTER — Telehealth: Payer: Self-pay | Admitting: Obstetrics and Gynecology

## 2017-11-26 NOTE — Telephone Encounter (Signed)
Patient reports she started having bleeding and passing clots yesterday. Today is having light spotting. Had PMB in 12/18. Had a PUS and EMB. PUS showed Pelvic USUterus - no fibroids.  Cystic spaces consistent with possible adenomyosis. EMS - 3.29 mm.Thickening in LUS.  Ovaries - left with small follicle.  Right normal.Free fluid  - none. EMB was negative. FSH was 51.2 in 04/2017. Advised will review with Dr.Silv and return call. Patient is agreeable.

## 2017-11-26 NOTE — Telephone Encounter (Signed)
Patient is having abnormal bleeding and would like to speak with a nurse.

## 2017-11-27 NOTE — Telephone Encounter (Signed)
Left message to call Omolola Mittman at [phone]. 

## 2017-11-27 NOTE — Telephone Encounter (Signed)
Needs office visit with me this week as a work in.  12:00 appointment time ok except for 6/13.

## 2017-11-27 NOTE — Telephone Encounter (Signed)
[facility]. I offered the patient an appointment for tomorrow, 11/28/17, at 12:00 PM with Dr. Edward JollySilva to arrive at 11:45 AM to check in. Patient accepted the appointment.  Patient appreciative and stated she does not need to [facility] further since she is coming to see the doctor tomorrow. Patient requested a call back only if necessary prior to the appointment.

## 2017-11-28 ENCOUNTER — Encounter: Payer: Self-pay | Admitting: Obstetrics and Gynecology

## 2017-11-28 ENCOUNTER — Ambulatory Visit: Payer: BLUE CROSS/BLUE SHIELD | Admitting: Obstetrics and Gynecology

## 2017-11-28 ENCOUNTER — Other Ambulatory Visit (HOSPITAL_COMMUNITY)
Admission: RE | Admit: 2017-11-28 | Discharge: 2017-11-28 | Disposition: A | Discharge status: Home / Self Care | Payer: BLUE CROSS/BLUE SHIELD | Source: Ambulatory Visit | Attending: Obstetrics and Gynecology | Admitting: Obstetrics and Gynecology

## 2017-11-28 ENCOUNTER — Other Ambulatory Visit: Payer: Self-pay

## 2017-11-28 VITALS — BP 116/78 | HR 68 | Resp 16 | Ht 61.75 in | Wt 201.0 lb

## 2017-11-28 DIAGNOSIS — Z124 Encounter for screening for malignant neoplasm of cervix: Secondary | ICD-10-CM

## 2017-11-28 DIAGNOSIS — N939 Abnormal uterine and vaginal bleeding, unspecified: Secondary | ICD-10-CM | POA: Diagnosis not present

## 2017-11-28 NOTE — Progress Notes (Signed)
GYNECOLOGY  VISIT   HPI: 52 y.o.   Married  Caucasian  female   G2P2002 with No LMP recorded. Patient has had an ablation.   here for bleeding on 11/25/17 for one day.   Some bloating. No breast tenderness.  No hot flashes.  Seen on 05/24/17 for evaluation of vaginal bleeding.  Had bleed episode for one day on 04/14/17.  FSH 15.6 on 12/13/16 FSH 51.1 and E2 17.9 on 04/25/17.  Had pelvic US and sonohysterogram/EMB.  Some thickening in the LUS.  EMS 3.29 mm.  Limited sonohysterogram.  EMB  showed small fragments of benign endometrial tissue.  GYNECOLOGIC HISTORY: No LMP recorded. Patient has had an ablation. Contraception:  Vasectomy/Ablation Menopausal hormone therapy:  none Last mammogram:  2019 -- [facility] Last pap smear:   11-26-14 Neg         OB History    Gravida  2   Para  2   Term  2   Preterm      AB      Living  2     SAB      TAB      Ectopic      Multiple      Live Births                 There are no active problems to display for this patient.   Past Medical History:  Diagnosis Date  . Hypercholesteremia   . Kidney stones   . Migraines   . Subclinical hypothyroidism 4/11    Past Surgical History:  Procedure Laterality Date  . LITHOTRIPSY    . NOVASURE ABLATION  12-18-06  . ROTATOR CUFF REPAIR Right 2009    Current Outpatient Medications  Medication Sig Dispense Refill  . MILK THISTLE PO Take by mouth as needed.    . Multiple Vitamin (MULTIVITAMIN) tablet Take 1 tablet by mouth daily.    . Omega-3 Fatty Acids (FISH OIL PO) Take by mouth daily.    . vitamin E 200 UNIT capsule Take 200 Units by mouth daily.    . betamethasone valerate ointment (VALISONE) 0.1 % Apply 1 application topically 2 (two) times daily. Use for one month and then stop. (Patient not taking: Reported on 11/28/2017) 30 g 0   No current facility-administered medications for this visit.      ALLERGIES: Zanaflex [tizanidine hcl] and Biaxin [clarithromycin]  Family  History  Problem Relation Age of Onset  . Diabetes Mother   . Cancer Paternal Grandmother        colon    Social History   Socioeconomic History  . Marital status: Married    Spouse name: Not on file  . Number of children: Not on file  . Years of education: Not on file  . Highest education level: Not on file  Occupational History  . Not on file  Social Needs  . Financial resource strain: Not on file  . Food insecurity:    Worry: Not on file    Inability: Not on file  . Transportation needs:    Medical: Not on file    Non-medical: Not on file  Tobacco Use  . Smoking status: Never Smoker  . Smokeless tobacco: Never Used  Substance and Sexual Activity  . Alcohol use: No    Alcohol/week: 0.0 oz  . Drug use: No  . Sexual activity: Yes    Partners: Male    Birth control/protection: Surgical    Comment: Ablation, Vasectomy  Lifestyle  .  Physical activity:    Days per week: Not on file    Minutes per session: Not on file  . Stress: Not on file  Relationships  . Social connections:    Talks on phone: Not on file    Gets together: Not on file    Attends religious service: Not on file    Active member of club or organization: Not on file    Attends meetings of clubs or organizations: Not on file    Relationship status: Not on file  . Intimate partner violence:    Fear of current or ex partner: Not on file    Emotionally abused: Not on file    Physically abused: Not on file    Forced sexual activity: Not on file  Other Topics Concern  . Not on file  Social History Narrative  . Not on file    Review of Systems  Constitutional: Negative.   HENT: Negative.   Eyes: Negative.   Respiratory: Negative.   Cardiovascular: Negative.   Gastrointestinal: Negative.   Endocrine: Negative.   Genitourinary:       Unscheduled bleeding  Musculoskeletal: Negative.   Skin: Negative.   Allergic/Immunologic: Negative.   Neurological: Negative.   Hematological: Negative.    Psychiatric/Behavioral: Negative.     PHYSICAL EXAMINATION:    BP 116/78 (BP Location: Right Arm, Patient Position: Sitting, Cuff Size: Large)   Pulse 68   Resp 16   Ht 5' 1.75" (1.568 m)   Wt 201 lb (91.2 kg)   BMI 37.06 kg/m     General appearance: alert, cooperative and appears stated age  Pelvic: External genitalia:  no lesions              Urethra:  normal appearing urethra with no masses, tenderness or lesions              Bartholins and Skenes: normal                 Vagina: normal appearing vagina with normal color and discharge, no lesions              Cervix: no lesions.  Cervix bleeds with pap.                 Bimanual Exam:  Uterus:  normal size, contour, position, consistency, mobility, non-tender              Adnexa: no mass, fullness, tenderness             Chaperone was present for exam.  ASSESSMENT  Vaginal bleeding.  Status post endometrial ablation. Cystic LUS on [place] in December 2018 with negative endometrial biopsy.   PLAN  Pap and HR HPV done.  We discussed vaginal bleeding and the importance of evaluating postmenopausal bleeding to evaluate for potential endometrial cancer.  Will check FSH and E2.  She will return after lunch and an afternoon meeting to do this.  May need repeat pelvic US.  She declines repeat office EMB. We discussed possible hysteroscopy with dilation and curettage and hysterectomy if needed.    An After Visit Summary was printed and given to the patient.  ___15___ minutes face to face time of which over 50% was spent in counseling.

## 2017-11-29 LAB — ESTRADIOL: Estradiol: 117.4 pg/mL

## 2017-11-29 LAB — FOLLICLE STIMULATING HORMONE: FSH: 15.9 m[IU]/mL

## 2017-11-30 LAB — CYTOLOGY - PAP
DIAGNOSIS: NEGATIVE
HPV (WINDOPATH): NOT DETECTED

## 2017-12-03 ENCOUNTER — Encounter: Payer: Self-pay | Admitting: Obstetrics and Gynecology

## 2018-03-07 NOTE — Progress Notes (Signed)
52 y.o. G52P2002 Married Caucasian female here for annual exam.    Had 3 cycles in the last 12 months.   Menses in August 2019, June 2019, and Oct.2018.   Hx of endometrial ablation.  E2 117.4 and FSH 15.9 in June 2019.   No hot flashes.   Doing dietary changes to reduce her CRP.  Using Valisone very sporadically.    Husband had an MI.  He is doing well.   PCP:   [facility] in [place].  Patient's last menstrual period was 02/12/2018.           Sexually active: Yes.    The current method of family planning is Ablasion/ husband vasectomy .    Exercising: Yes.    Gym/ health club routine includes light weights and treadmill. Smoker:  no  Health Maintenance: Pap:  11/28/2017 negative, Neg HR HPV.  History of abnormal Pap:  no MMG:  11/05/2017 BIRADS categorey 2: benign Colonoscopy:  11/29/2016 Dr Loreta Ave follow up 10 years BMD:   n/a  Result  n/a TDaP:  2008 Gardasil:   yes HIV:  Neg in pregnancy.  Hep C:  NA.  Screening Labs:   PCP.    reports that she has never smoked. She has never used smokeless tobacco. She reports that she does not drink alcohol or use drugs.  Past Medical History:  Diagnosis Date  . Hypercholesteremia   . Kidney stones   . Migraines   . Subclinical hypothyroidism 4/11    Past Surgical History:  Procedure Laterality Date  . LITHOTRIPSY    . NOVASURE ABLATION  12-18-06  . ROTATOR CUFF REPAIR Right 2009    Current Outpatient Medications  Medication Sig Dispense Refill  . betamethasone valerate ointment (VALISONE) 0.1 % Apply 1 application topically 2 (two) times daily. Use for one month and then stop. 30 g 0  . Multiple Vitamin (MULTIVITAMIN) tablet Take 1 tablet by mouth daily.    . Omega-3 Fatty Acids (FISH OIL PO) Take by mouth daily.    Marland Kitchen MILK THISTLE PO Take by mouth as needed.    . vitamin E 200 UNIT capsule Take 200 Units by mouth daily.     No current facility-administered medications for this visit.     Family History   Problem Relation Age of Onset  . Diabetes Mother   . Cancer Paternal Grandmother        colon    Review of Systems  Constitutional: Negative.   HENT: Negative.   Eyes: Negative.   Respiratory: Negative.   Cardiovascular: Negative.   Gastrointestinal: Negative.   Endocrine: Negative.   Genitourinary: Negative.   Musculoskeletal: Negative.   Skin: Negative.   Allergic/Immunologic: Negative.   Neurological: Negative.   Hematological: Negative.   Psychiatric/Behavioral: Negative.   All other systems reviewed and are negative.   Exam:   BP 108/68   Pulse 74   Resp 16   Ht 5\' 1"  (1.549 m)   Wt 198 lb (89.8 kg)   LMP 02/12/2018   BMI 37.41 kg/m     General appearance: alert, cooperative and appears stated age Head: Normocephalic, without obvious abnormality, atraumatic Neck: no adenopathy, supple, symmetrical, trachea midline and thyroid normal to inspection and palpation Lungs: clear to auscultation bilaterally Breasts: normal appearance, no masses or tenderness, No nipple retraction or dimpling, No nipple discharge or bleeding, No axillary or supraclavicular adenopathy Heart: regular rate and rhythm Abdomen: soft, non-tender; no masses, no organomegaly Extremities: extremities normal, atraumatic, no  cyanosis or edema Skin: Skin color, texture, turgor normal. No rashes or lesions Lymph nodes: Cervical, supraclavicular, and axillary nodes normal. No abnormal inguinal nodes palpated Neurologic: Grossly normal  Pelvic: External genitalia:  no lesions              Urethra:  normal appearing urethra with no masses, tenderness or lesions              Bartholins and Skenes: normal                 Vagina: normal appearing vagina with normal color and discharge, no lesions              Cervix: no lesions              Pap taken: No. Bimanual Exam:  Uterus:  normal size, contour, position, consistency, mobility, non-tender              Adnexa: no mass, fullness, tenderness               Rectal exam: Yes.  .  Confirms.              Anus:  normal sphincter tone, no lesions  Chaperone was present for exam.  Assessment:   Well woman visit with normal exam. Hx lichen planus of the vulva.  Plan: Mammogram screening. Recommended self breast awareness. Pap and HR HPV as above. Guidelines for Calcium, Vitamin D, regular exercise program including cardiovascular and weight bearing exercise. Use Valisone prn.  TDap.  Follow up annually and prn.   After visit summary provided.

## 2018-03-08 ENCOUNTER — Encounter: Payer: Self-pay | Admitting: Obstetrics and Gynecology

## 2018-03-08 ENCOUNTER — Ambulatory Visit: Payer: BLUE CROSS/BLUE SHIELD | Admitting: Obstetrics and Gynecology

## 2018-03-08 VITALS — BP 108/68 | HR 74 | Resp 16 | Ht 61.0 in | Wt 198.0 lb

## 2018-03-08 DIAGNOSIS — Z01419 Encounter for gynecological examination (general) (routine) without abnormal findings: Secondary | ICD-10-CM | POA: Diagnosis not present

## 2018-03-08 DIAGNOSIS — Z23 Encounter for immunization: Secondary | ICD-10-CM

## 2018-03-08 NOTE — Patient Instructions (Signed)

## 2018-03-19 ENCOUNTER — Ambulatory Visit: Payer: BLUE CROSS/BLUE SHIELD | Admitting: Certified Nurse Midwife

## 2018-06-19 DIAGNOSIS — R7309 Other abnormal glucose: Secondary | ICD-10-CM

## 2018-06-19 HISTORY — DX: Other abnormal glucose: R73.09

## 2018-07-30 LAB — TSH: TSH: 3.52 (ref 0.41–5.90)

## 2018-07-30 LAB — HEMOGLOBIN A1C: Hemoglobin A1C: 6

## 2018-07-30 LAB — BASIC METABOLIC PANEL
BUN: 14 (ref 4–21)
Creatinine: 0.8 (ref 0.5–1.1)
Glucose: 112
Potassium: 4.2 (ref 3.4–5.3)
Sodium: 136 — AB (ref 137–147)

## 2018-07-30 LAB — LIPID PANEL
Cholesterol: 259 — AB (ref 0–200)
HDL: 67 (ref 35–70)
LDL Cholesterol: 1636
Triglycerides: 146 (ref 40–160)

## 2018-07-30 LAB — HEPATIC FUNCTION PANEL
ALT: 20 (ref 7–35)
AST: 17 (ref 13–35)

## 2018-07-30 LAB — VITAMIN D 25 HYDROXY (VIT D DEFICIENCY, FRACTURES): Vit D, 25-Hydroxy: 36.1

## 2018-07-30 LAB — CBC AND DIFFERENTIAL
HCT: 42 (ref 36–46)
Hemoglobin: 14 (ref 12.0–16.0)
Neutrophils Absolute: 7
Platelets: 306 (ref 150–399)
WBC: 9.7

## 2018-11-08 ENCOUNTER — Telehealth: Payer: Self-pay | Admitting: Obstetrics and Gynecology

## 2018-11-08 NOTE — Telephone Encounter (Signed)
Spoke with patient. She complains of irritability/weight gain/insomnia. Request appointment to discuss with Dr.Silva and thinks she may need hormones checked. Made appointment for 11-14-2018 8:00am.

## 2018-11-08 NOTE — Telephone Encounter (Signed)
Patient is experiencing some weight gain and anxiety. Wanting to discuss menopause.

## 2018-11-12 ENCOUNTER — Other Ambulatory Visit: Payer: Self-pay

## 2018-11-14 ENCOUNTER — Encounter: Payer: Self-pay | Admitting: Obstetrics and Gynecology

## 2018-11-14 ENCOUNTER — Ambulatory Visit: Payer: BLUE CROSS/BLUE SHIELD | Admitting: Obstetrics and Gynecology

## 2018-11-14 ENCOUNTER — Other Ambulatory Visit: Payer: Self-pay

## 2018-11-14 VITALS — BP 110/70 | HR 80 | Temp 97.9°F | Resp 14 | Ht 61.5 in | Wt 217.2 lb

## 2018-11-14 DIAGNOSIS — Z6841 Body Mass Index (BMI) 40.0 and over, adult: Secondary | ICD-10-CM

## 2018-11-14 DIAGNOSIS — R7309 Other abnormal glucose: Secondary | ICD-10-CM

## 2018-11-14 DIAGNOSIS — R0789 Other chest pain: Secondary | ICD-10-CM | POA: Diagnosis not present

## 2018-11-14 DIAGNOSIS — N951 Menopausal and female climacteric states: Secondary | ICD-10-CM

## 2018-11-14 DIAGNOSIS — N941 Unspecified dyspareunia: Secondary | ICD-10-CM

## 2018-11-14 MED ORDER — ESTROGENS, CONJUGATED 0.625 MG/GM VA CREA: 1.0000 | TOPICAL_CREAM | Freq: Every day | VAGINAL | 1 refills | Status: DC

## 2018-11-14 NOTE — Progress Notes (Signed)
Spoke with [name]. Patient scheduled while in office for virtual visit with Dr. Earlene Plater on 6/2 at 1:20pm to establish care. Hx of pre-diabetes. Chest heaviness and anxiety, started 06/2018. Patient verbalizes understanding and is agreeable.

## 2018-11-14 NOTE — Progress Notes (Signed)
GYNECOLOGY  VISIT   HPI: 53 y.o.   Married  Caucasian  female   G2P2002 with Patient's last menstrual period was 08/08/2018 (approximate).   here for weight gain, anxiety, occasional pain with intercourse    States she has gained 20 pounds since January, 2020.  No dietary change.  Feeling frustrated with efforts for 6 - 7 years.   States she has had elevated blood pressure at home.  Random BP 159/96, when she took it at home.  BP was 129/88 last hs. Notes hand and feet swelling, and she feels puffy.  Has chest pain, heaviness, and anxiety.  She states the heaviness comes and then she feels anxious. This can happen several times per week.   Her husband has had an MI last year and she is in tax season, so she has some stress.  Not sleeping well.   States she did urinary testing to check hormones.  Done at [facility]. States her estrogen was high and progesterone was low.  Saw [facility] in February, and her labs were unchanged.  She has been taking [place] thyroid to try to help with weight loss.  [facility] labs through [facility]: A1C 6.0. FSH 56.6. TSH 3.52.  T chol 259 with LDL 163.   Having increased heat just a handful of times.  No night sweats.  No vaginal dryness.  Some discomfort with intercourse at insertion.  She can occasionally have tearing of the skin on the outside.   GYNECOLOGIC HISTORY: Patient's last menstrual period was 08/08/2018 (approximate). Contraception:  Ablation, vasectomy Menopausal hormone therapy:  none Last mammogram:  11/05/2017 BIRADS categorey 2: benign Last pap smear:   11/28/2017 negative, Neg HR HPV        OB History    Gravida  2   Para  2   Term  2   Preterm      AB      Living  2     SAB      TAB      Ectopic      Multiple      Live Births                 There are no active problems to display for this patient.   Past Medical History:  Diagnosis Date  . Elevated hemoglobin A1c 2020   . Hypercholesteremia   . Kidney stones   . Migraines   . Subclinical hypothyroidism 4/11    Past Surgical History:  Procedure Laterality Date  . LITHOTRIPSY    . NOVASURE ABLATION  12-18-06  . ROTATOR CUFF REPAIR Right 2009    Current Outpatient Medications  Medication Sig Dispense Refill  . betamethasone valerate ointment (VALISONE) 0.1 % Apply 1 application topically 2 (two) times daily. Use for one month and then stop. 30 g 0  . MAGNESIUM CITRATE PO Take by mouth.    . Omega-3 Fatty Acids (FISH OIL PO) Take by mouth daily.    Marland Kitchen. conjugated estrogens (PREMARIN) vaginal cream Place 1 Applicatorful vaginally daily. Use 1/2 g vaginally and to vulva every night at bed time for the first 2 weeks, then use 1/2 g vaginally two times per week. 30 g 1  . Multiple Vitamin (MULTIVITAMIN) tablet Take 1 tablet by mouth daily.     No current facility-administered medications for this visit.      ALLERGIES: Zanaflex [tizanidine hcl] and Biaxin [clarithromycin]  Family History  Problem Relation Age of Onset  . Diabetes  Mother   . Cancer Paternal Grandmother        colon    Social History   Socioeconomic History  . Marital status: Married    Spouse name: Not on file  . Number of children: Not on file  . Years of education: Not on file  . Highest education level: Not on file  Occupational History  . Not on file  Social Needs  . Financial resource strain: Not on file  . Food insecurity:    Worry: Not on file    Inability: Not on file  . Transportation needs:    Medical: Not on file    Non-medical: Not on file  Tobacco Use  . Smoking status: Never Smoker  . Smokeless tobacco: Never Used  Substance and Sexual Activity  . Alcohol use: No    Alcohol/week: 0.0 standard drinks  . Drug use: No  . Sexual activity: Yes    Partners: Male    Birth control/protection: Surgical    Comment: Ablation, Vasectomy  Lifestyle  . Physical activity:    Days per week: Not on file    Minutes  per session: Not on file  . Stress: Not on file  Relationships  . Social connections:    Talks on phone: Not on file    Gets together: Not on file    Attends religious service: Not on file    Active member of club or organization: Not on file    Attends meetings of clubs or organizations: Not on file    Relationship status: Not on file  . Intimate partner violence:    Fear of current or ex partner: Not on file    Emotionally abused: Not on file    Physically abused: Not on file    Forced sexual activity: Not on file  Other Topics Concern  . Not on file  Social History Narrative  . Not on file    Review of Systems  Constitutional:       Weight gain Insomnia   HENT: Negative.   Eyes: Negative.   Respiratory: Negative.   Cardiovascular: Negative.   Gastrointestinal: Negative.   Endocrine: Negative.   Genitourinary:       Pain with intercourse  Musculoskeletal: Negative.   Skin: Negative.   Allergic/Immunologic: Negative.   Neurological: Negative.   Hematological: Negative.   Psychiatric/Behavioral: The patient is nervous/anxious.     PHYSICAL EXAMINATION:    BP 110/70 (BP Location: Left Arm, Patient Position: Sitting, Cuff Size: Normal)   Pulse 80   Temp 97.9 F (36.6 C) (Temporal)   Resp 14   Ht 5' 1.5" (1.562 m)   Wt 217 lb 3.2 oz (98.5 kg)   LMP 08/08/2018 (Approximate)   BMI 40.37 kg/m     General appearance: alert, cooperative and appears stated age  Pelvic: External genitalia:  no lesions.  Increased pigmentation of left introitus.              Urethra:  normal appearing urethra with no masses, tenderness or lesions              Bartholins and Skenes: normal                 Vagina: normal appearing vagina with normal color and discharge, no lesions              Cervix: no lesions                Bimanual Exam:  Uterus:  normal size, contour, position, consistency, mobility, non-tender              Adnexa: no mass, fullness, tenderness             Chaperone was present for exam.  ASSESSMENT  Perimenopausal female. Introital pain with intercourse. Chest pain and heaviness.  Elevated A1C.  Possible new dx HTN. Anxiety.  PLAN  We discussed perimenopause. Rx for Premarin cream.  She will update her mammogram.   Referral to PCP for medical assessment. She has risk factors for cardiovascular disease.  Referral to Dr. Dalbert Garnet for weight loss plan.  Call 911 if needed.  No Rx given for anxiety today.   Annual exam in September, 2020.  FU sooner as needed.   An After Visit Summary was printed and given to the patient.  __25____ minutes face to face time of which over 50% was spent in counseling.

## 2018-11-16 ENCOUNTER — Encounter: Payer: Self-pay | Admitting: Obstetrics and Gynecology

## 2018-11-16 DIAGNOSIS — R7309 Other abnormal glucose: Secondary | ICD-10-CM | POA: Insufficient documentation

## 2018-11-18 NOTE — Progress Notes (Signed)
[name] is a 53 y.o. female is here to [facility].   Patient Care Team: [name], [name] as Taylor - General   History of Present Illness:   [name], CMA acting as scribe for Dr. Briscoe Deutscher.   [id number] was referred by Dr. Quincy [name]. She has history of elevated A1c. Not on any medications for that and has never been. Patient was seen at [name] the past. She has brought labs form them for review. She was last seen [name] October. She was going there for weight loss but never seen any improvement so she does not wish to continue. She likes to avoid medications if she can.   She was followed by [name] for weight loss. She has gained over 20lbs from January 2020. She has been on "every diet you can imagine". She exercises doing weight training 5 times a week for 30-45 min each time. She has never met with dietitian and is not interested [name] it.   Insomnia:  Patient states she is waking every day at three [name] the morning. She has no problem going to sleep. She is getting on average 6-7 hrs of sleep a night. She does not feel fatigued when she wakes up but does have issues with it throughout the day. She does have issue with snoring but only started with recent weight gain. She never wakes up gasping for air. She has never tried any sleep aids.   Health Maintenance Due  Topic Date Due  . HIV Screening  01/23/1981   Depression screen PHQ 2/9 11/19/2018  Decreased Interest 0  Down, Depressed, Hopeless 0  PHQ - 2 Score 0  Altered sleeping 1  Tired, decreased energy 1  Change [name] appetite 0  Feeling bad or failure about yourself  0  Trouble concentrating 0  Moving slowly or fidgety/restless 0  Suicidal thoughts 0  PHQ-9 Score 2    PMHx, SurgHx, SocialHx, Medications, and Allergies were reviewed [name] the Visit Navigator and updated as appropriate.   Past Medical History:  Diagnosis Date  . Elevated hemoglobin A1c 2020  . Hypercholesteremia     . Kidney stones   . Migraines   . Subclinical hypothyroidism 4/11     Past Surgical History:  Procedure Laterality Date  . LITHOTRIPSY    . NOVASURE ABLATION  12-18-06  . ROTATOR CUFF REPAIR Right 2009     Family History  Problem Relation Age of Onset  . Diabetes Mother   . Cancer Paternal Grandmother        colon    Social History   Tobacco Use  . Smoking status: Never Smoker  . Smokeless tobacco: Never Used  Substance Use Topics  . Alcohol use: No    Alcohol/week: 0.0 standard drinks  . Drug use: No    Current Medications and Allergies   .  betamethasone valerate ointment (VALISONE) 0.1 %, Apply 1 application topically 2 (two) times daily. Use for one month and then stop., Disp: 30 g, Rfl: 0 .  conjugated estrogens (PREMARIN) vaginal cream, Place 1 Applicatorful vaginally daily. Use 1/2 g vaginally and to vulva every night at bed time for the first 2 weeks, then use 1/2 g vaginally two times per week., Disp: 30 g, Rfl: 1 .  MAGNESIUM CITRATE PO, Take by mouth., Disp: , Rfl:  .  Multiple Vitamin (MULTIVITAMIN) tablet, Take 1 tablet by mouth daily., Disp: , Rfl:  .  Omega-3 Fatty Acids (FISH  OIL PO), Take by mouth daily., Disp: , Rfl:    Allergies  Allergen Reactions  . Zanaflex [Tizanidine Hcl]     Cant move  . Biaxin [Clarithromycin]     Severe stomach cramps   Review of Systems   Pertinent items are noted [name] the HPI. Otherwise, a complete ROS is negative.  Vitals   Vitals:   11/19/18 1314  BP: 126/78  Pulse: (!) 107  Temp: 98.3 F (36.8 C)  TempSrc: Oral  SpO2: 94%  Weight: 216 lb (98 kg)  Height: 5' 2"  (1.575 m)     Body mass index is 39.51 kg/m.  Physical Exam   Physical Exam Vitals signs and nursing note reviewed. Exam conducted with a chaperone present.  HENT:     Head: Normocephalic and atraumatic.  Eyes:     Pupils: Pupils are equal, round, and reactive to light.  Neck:     Musculoskeletal: Normal range of motion and neck supple.   Cardiovascular:     Rate and Rhythm: Normal rate and regular rhythm.  Pulmonary:     Effort: Pulmonary effort is normal.  Abdominal:     Palpations: Abdomen is soft.  Skin:    General: Skin is warm.  Psychiatric:        Behavior: Behavior normal.    Results for orders placed or performed [name] visit on 28/00/34  Follicle stimulating hormone  Result Value Ref Range   FSH 15.9 mIU/mL  Estradiol  Result Value Ref Range   Estradiol 117.4 pg/mL  Cytology - PAP  Result Value Ref Range   Adequacy      Satisfactory for evaluation  endocervical/transformation zone component PRESENT.   Diagnosis      NEGATIVE FOR INTRAEPITHELIAL LESIONS OR MALIGNANCY.   HPV NOT DETECTED    Material Submitted CervicoVaginal Pap [ThinPrep Imaged]     Assessment and Plan   [name] was seen today for establish care.  Diagnoses and all orders for this visit:  OSA (obstructive sleep apnea) -     Ambulatory referral to Sleep Studies  Medication management -     CT CARDIAC SCORING; Future -     ECHOCARDIOGRAM COMPLETE; Future  Insulin resistance -     Semaglutide,0.25 or 0.5MG/DOS, (OZEMPIC, 0.25 OR 0.5 MG/DOSE,) 2 MG/1.5ML SOPN; Inject 0.25 mg into the skin once a week for 14 days, THEN 0.5 mg once a week for 14 days.  Vitamin D deficiency  Lower extremity edema -     furosemide (LASIX) 20 MG tablet; Take 1 tablet (20 mg total) by mouth daily. -     potassium chloride 20 MEQ TBCR; Take 20 mEq by mouth daily.  Primary insomnia  Fen-phen history  Morbid obesity ([place])    . Orders and follow up as documented [name] [place], reviewed diet, exercise and weight control, cardiovascular risk and specific lipid/LDL goals reviewed, reviewed medications and side effects [name] detail.  . Reviewed expectations re: course of current medical issues. . Outlined signs and symptoms indicating need for more acute intervention. . Patient verbalized understanding and all questions were answered. . Patient received an  After Visit Summary.  CMA served as Education administrator during this visit. History, Physical, and Plan performed by medical provider. The above documentation has been reviewed and is accurate and complete. Briscoe Deutscher, D.O.  Briscoe Deutscher, DO [place], [facility] 11/24/2018

## 2018-11-19 ENCOUNTER — Ambulatory Visit: Payer: BLUE CROSS/BLUE SHIELD | Admitting: Family Medicine

## 2018-11-19 ENCOUNTER — Encounter: Payer: Self-pay | Admitting: Family Medicine

## 2018-11-19 ENCOUNTER — Other Ambulatory Visit: Payer: Self-pay

## 2018-11-19 VITALS — BP 126/78 | HR 107 | Temp 98.3°F | Ht 62.0 in | Wt 216.0 lb

## 2018-11-19 DIAGNOSIS — G4733 Obstructive sleep apnea (adult) (pediatric): Secondary | ICD-10-CM | POA: Diagnosis not present

## 2018-11-19 DIAGNOSIS — E8881 Metabolic syndrome: Secondary | ICD-10-CM | POA: Diagnosis not present

## 2018-11-19 DIAGNOSIS — E559 Vitamin D deficiency, unspecified: Secondary | ICD-10-CM | POA: Diagnosis not present

## 2018-11-19 DIAGNOSIS — F5101 Primary insomnia: Secondary | ICD-10-CM

## 2018-11-19 DIAGNOSIS — Z9189 Other specified personal risk factors, not elsewhere classified: Secondary | ICD-10-CM

## 2018-11-19 DIAGNOSIS — Z79899 Other long term (current) drug therapy: Secondary | ICD-10-CM

## 2018-11-19 DIAGNOSIS — E88819 Insulin resistance, unspecified: Secondary | ICD-10-CM

## 2018-11-19 DIAGNOSIS — R600 Localized edema: Secondary | ICD-10-CM

## 2018-11-19 DIAGNOSIS — Z9229 Personal history of other drug therapy: Secondary | ICD-10-CM

## 2018-11-19 MED ORDER — SEMAGLUTIDE(0.25 OR 0.5MG/DOS) 2 MG/1.5ML ~~LOC~~ SOPN: PEN_INJECTOR | SUBCUTANEOUS | 1 refills | Status: DC

## 2018-11-19 MED ORDER — FUROSEMIDE 20 MG PO TABS: 20.0000 mg | ORAL_TABLET | Freq: Every day | ORAL | 0 refills | Status: DC

## 2018-11-19 MED ORDER — POTASSIUM CHLORIDE ER 20 MEQ PO TBCR: 20.0000 meq | EXTENDED_RELEASE_TABLET | Freq: Every day | ORAL | 0 refills | Status: DC

## 2018-11-21 ENCOUNTER — Encounter: Payer: Self-pay | Admitting: Family Medicine

## 2018-11-24 ENCOUNTER — Encounter: Payer: Self-pay | Admitting: Family Medicine

## 2018-11-24 DIAGNOSIS — G4733 Obstructive sleep apnea (adult) (pediatric): Secondary | ICD-10-CM | POA: Insufficient documentation

## 2018-11-24 DIAGNOSIS — Z9189 Other specified personal risk factors, not elsewhere classified: Secondary | ICD-10-CM

## 2018-11-24 DIAGNOSIS — E8881 Metabolic syndrome: Secondary | ICD-10-CM | POA: Insufficient documentation

## 2018-11-24 DIAGNOSIS — Z9229 Personal history of other drug therapy: Secondary | ICD-10-CM | POA: Insufficient documentation

## 2018-11-24 DIAGNOSIS — E559 Vitamin D deficiency, unspecified: Secondary | ICD-10-CM | POA: Insufficient documentation

## 2018-11-24 DIAGNOSIS — R7303 Prediabetes: Secondary | ICD-10-CM | POA: Insufficient documentation

## 2018-11-24 HISTORY — DX: Other specified personal risk factors, not elsewhere classified: Z91.89

## 2018-11-24 HISTORY — DX: Morbid (severe) obesity due to excess calories: E66.01

## 2018-11-25 NOTE — Telephone Encounter (Signed)
Called [facility] and scheduled for 12/10/18, this was the soonest available appt.

## 2018-12-03 NOTE — Telephone Encounter (Signed)
Left message to return call to our office.  

## 2018-12-05 ENCOUNTER — Encounter: Payer: Self-pay | Admitting: Family Medicine

## 2018-12-05 ENCOUNTER — Telehealth: Payer: Self-pay

## 2018-12-05 NOTE — Telephone Encounter (Signed)
Okay labs: CBC, CMP, LIPID, A1C, TSH, BNP, MAG, TROPONIN. Will need to be sent to [facility]. Can she give [place] any vitals? If not, I wold like to see her in the office next week.

## 2018-12-05 NOTE — Telephone Encounter (Signed)
Called patient she was on hold with another call and not able to talk at this time.

## 2018-12-05 NOTE — Telephone Encounter (Signed)
Spoke with patient.  She reports that she has gained weight despite being on Ozempic, continues with chest tightness on and off and feels that this is stress related.  Feels that Lasix has not worked at all.  Has appt scheduled for 6/30 for her echo.  She is also wanting to know if she is able to go to [facility] for her blood work which is very close to her home and more convenient.  She is willing to come here for labs if that is what Dr. Juleen China prefers.  Please advise

## 2018-12-06 ENCOUNTER — Other Ambulatory Visit: Payer: Self-pay | Admitting: Family Medicine

## 2018-12-06 ENCOUNTER — Other Ambulatory Visit: Payer: Self-pay

## 2018-12-06 DIAGNOSIS — Z79899 Other long term (current) drug therapy: Secondary | ICD-10-CM

## 2018-12-06 DIAGNOSIS — Z9229 Personal history of other drug therapy: Secondary | ICD-10-CM

## 2018-12-06 DIAGNOSIS — R600 Localized edema: Secondary | ICD-10-CM

## 2018-12-06 DIAGNOSIS — E559 Vitamin D deficiency, unspecified: Secondary | ICD-10-CM

## 2018-12-06 DIAGNOSIS — Z9189 Other specified personal risk factors, not elsewhere classified: Secondary | ICD-10-CM

## 2018-12-06 LAB — HEPATIC FUNCTION PANEL: Bilirubin, Total: 0.4

## 2018-12-06 LAB — LIPID PANEL
HDL: 65 (ref 35–70)
LDL Cholesterol: 155

## 2018-12-06 LAB — BASIC METABOLIC PANEL
BUN: 10 (ref 4–21)
Creatinine: 0.7 (ref 0.5–1.1)
Glucose: 106

## 2018-12-07 LAB — CBC AND DIFFERENTIAL
HCT: 41 (ref 36–46)
Hemoglobin: 13.7 (ref 12.0–16.0)

## 2018-12-09 NOTE — Telephone Encounter (Signed)
See other messages, done.

## 2018-12-10 ENCOUNTER — Other Ambulatory Visit: Payer: Self-pay | Admitting: Family Medicine

## 2018-12-10 ENCOUNTER — Other Ambulatory Visit (HOSPITAL_COMMUNITY): Payer: BC Managed Care – PPO

## 2018-12-10 DIAGNOSIS — E8881 Metabolic syndrome: Secondary | ICD-10-CM

## 2018-12-10 LAB — CBC WITH DIFFERENTIAL/PLATELET
Basophils Absolute: 0.1 10*3/uL (ref 0.0–0.2)
Basos: 1 %
EOS (ABSOLUTE): 0.3 10*3/uL (ref 0.0–0.4)
Eos: 4 %
Hematocrit: 41.4 % (ref 34.0–46.6)
Hemoglobin: 13.7 g/dL (ref 11.1–15.9)
Immature Grans (Abs): 0.1 10*3/uL (ref 0.0–0.1)
Immature Granulocytes: 1 %
Lymphocytes Absolute: 1.6 10*3/uL (ref 0.7–3.1)
Lymphs: 18 %
MCH: 30.4 pg (ref 26.6–33.0)
MCHC: 33.1 g/dL (ref 31.5–35.7)
MCV: 92 fL (ref 79–97)
Monocytes Absolute: 0.9 10*3/uL (ref 0.1–0.9)
Monocytes: 10 %
Neutrophils Absolute: 6 10*3/uL (ref 1.4–7.0)
Neutrophils: 66 %
Platelets: 286 10*3/uL (ref 150–450)
RBC: 4.51 x10E6/uL (ref 3.77–5.28)
RDW: 13.6 % (ref 11.7–15.4)
WBC: 8.9 10*3/uL (ref 3.4–10.8)

## 2018-12-10 LAB — COMPREHENSIVE METABOLIC PANEL
ALT: 36 IU/L — ABNORMAL HIGH (ref 0–32)
AST: 34 IU/L (ref 0–40)
Albumin/Globulin Ratio: 2 (ref 1.2–2.2)
Albumin: 4.5 g/dL (ref 3.8–4.9)
Alkaline Phosphatase: 92 IU/L (ref 39–117)
BUN/Creatinine Ratio: 14 (ref 9–23)
BUN: 10 mg/dL (ref 6–24)
Bilirubin Total: 0.4 mg/dL (ref 0.0–1.2)
CO2: 25 mmol/L (ref 20–29)
Calcium: 9.3 mg/dL (ref 8.7–10.2)
Chloride: 100 mmol/L (ref 96–106)
Creatinine, Ser: 0.71 mg/dL (ref 0.57–1.00)
GFR calc Af Amer: 113 mL/min/{1.73_m2} (ref >59)
GFR calc non Af Amer: 98 mL/min/{1.73_m2} (ref >59)
Globulin, Total: 2.3 g/dL (ref 1.5–4.5)
Glucose: 106 mg/dL — ABNORMAL HIGH (ref 65–99)
Potassium: 4.5 mmol/L (ref 3.5–5.2)
Sodium: 137 mmol/L (ref 134–144)
Total Protein: 6.8 g/dL (ref 6.0–8.5)

## 2018-12-10 LAB — TSH: TSH: 4.01 u[IU]/mL (ref 0.450–4.500)

## 2018-12-10 LAB — BRAIN NATRIURETIC PEPTIDE

## 2018-12-10 LAB — LIPID PANEL W/O CHOL/HDL RATIO
Cholesterol, Total: 257 mg/dL — ABNORMAL HIGH (ref 100–199)
HDL: 65 mg/dL (ref >39)
LDL Calculated: 155 mg/dL — ABNORMAL HIGH (ref 0–99)
Triglycerides: 185 mg/dL — ABNORMAL HIGH (ref 0–149)
VLDL Cholesterol Cal: 37 mg/dL (ref 5–40)

## 2018-12-10 LAB — TROPONIN I: Troponin I: <0.01 ng/mL (ref 0.00–0.04)

## 2018-12-10 LAB — HGB A1C W/O EAG: Hgb A1c MFr Bld: 6.2 % — ABNORMAL HIGH (ref 4.8–5.6)

## 2018-12-10 LAB — MAGNESIUM: Magnesium: 2.1 mg/dL (ref 1.6–2.3)

## 2018-12-11 ENCOUNTER — Telehealth: Payer: Self-pay | Admitting: Family Medicine

## 2018-12-11 NOTE — Telephone Encounter (Signed)
Echo did not require a prior auth.  Call Ref # [id number]

## 2018-12-12 ENCOUNTER — Encounter: Payer: Self-pay | Admitting: Family Medicine

## 2018-12-12 DIAGNOSIS — E782 Mixed hyperlipidemia: Secondary | ICD-10-CM | POA: Insufficient documentation

## 2018-12-12 DIAGNOSIS — R7989 Other specified abnormal findings of blood chemistry: Secondary | ICD-10-CM | POA: Insufficient documentation

## 2018-12-12 HISTORY — DX: Other specified abnormal findings of blood chemistry: R79.89

## 2018-12-12 HISTORY — DX: Mixed hyperlipidemia: E78.2

## 2018-12-13 ENCOUNTER — Encounter: Payer: Self-pay | Admitting: Family Medicine

## 2018-12-16 ENCOUNTER — Telehealth (HOSPITAL_COMMUNITY): Payer: Self-pay

## 2018-12-16 ENCOUNTER — Ambulatory Visit: Payer: BC Managed Care – PPO | Admitting: Neurology

## 2018-12-16 ENCOUNTER — Encounter: Payer: Self-pay | Admitting: Neurology

## 2018-12-16 ENCOUNTER — Other Ambulatory Visit: Payer: Self-pay

## 2018-12-16 VITALS — BP 130/84 | HR 92 | Temp 98.2°F | Ht 62.0 in | Wt 212.0 lb

## 2018-12-16 DIAGNOSIS — Z9189 Other specified personal risk factors, not elsewhere classified: Secondary | ICD-10-CM

## 2018-12-16 DIAGNOSIS — R0683 Snoring: Secondary | ICD-10-CM

## 2018-12-16 DIAGNOSIS — Z9229 Personal history of other drug therapy: Secondary | ICD-10-CM

## 2018-12-16 DIAGNOSIS — E782 Mixed hyperlipidemia: Secondary | ICD-10-CM

## 2018-12-16 DIAGNOSIS — R945 Abnormal results of liver function studies: Secondary | ICD-10-CM

## 2018-12-16 DIAGNOSIS — G478 Other sleep disorders: Secondary | ICD-10-CM

## 2018-12-16 DIAGNOSIS — R7989 Other specified abnormal findings of blood chemistry: Secondary | ICD-10-CM

## 2018-12-16 DIAGNOSIS — G4733 Obstructive sleep apnea (adult) (pediatric): Secondary | ICD-10-CM

## 2018-12-16 DIAGNOSIS — E8881 Metabolic syndrome: Secondary | ICD-10-CM

## 2018-12-16 NOTE — Patient Instructions (Signed)

## 2018-12-16 NOTE — Telephone Encounter (Signed)
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
## 2018-12-17 ENCOUNTER — Ambulatory Visit (HOSPITAL_COMMUNITY): Payer: BC Managed Care – PPO | Attending: Cardiology

## 2018-12-17 ENCOUNTER — Telehealth: Payer: Self-pay | Admitting: Physical Therapy

## 2018-12-17 ENCOUNTER — Other Ambulatory Visit: Payer: Self-pay

## 2018-12-17 ENCOUNTER — Ambulatory Visit (INDEPENDENT_AMBULATORY_CARE_PROVIDER_SITE_OTHER)
Admission: RE | Admit: 2018-12-17 | Discharge: 2018-12-17 | Disposition: A | Discharge status: Home / Self Care | Payer: BC Managed Care – PPO | Source: Ambulatory Visit | Attending: Family Medicine | Admitting: Family Medicine

## 2018-12-17 DIAGNOSIS — Z79899 Other long term (current) drug therapy: Secondary | ICD-10-CM

## 2018-12-17 MED ORDER — PERFLUTREN LIPID MICROSPHERE
1.0000 mL | INTRAVENOUS | Status: AC | PRN
  Administered 2018-12-17: 2 mL via INTRAVENOUS

## 2018-12-17 NOTE — Telephone Encounter (Signed)
results printed and placed on your computer.

## 2018-12-17 NOTE — Telephone Encounter (Signed)
Copied from [place] [id number]. Topic: General - Other >> Dec 17, 2018  9:04 AM Alanda Slim E wrote: Reason for CRM: Linus Orn from [facility] called with a CT Scan report for the patient/ please call for results [phone]

## 2019-01-02 ENCOUNTER — Other Ambulatory Visit: Payer: Self-pay | Admitting: Family Medicine

## 2019-01-02 DIAGNOSIS — E8881 Metabolic syndrome: Secondary | ICD-10-CM

## 2019-01-07 NOTE — Telephone Encounter (Signed)
OK to continue on Ozempic 0.5 mg once a week?

## 2019-01-13 ENCOUNTER — Encounter: Payer: Self-pay | Admitting: Obstetrics and Gynecology

## 2019-01-13 ENCOUNTER — Ambulatory Visit (INDEPENDENT_AMBULATORY_CARE_PROVIDER_SITE_OTHER): Payer: BC Managed Care – PPO | Admitting: Neurology

## 2019-01-13 DIAGNOSIS — Z9189 Other specified personal risk factors, not elsewhere classified: Secondary | ICD-10-CM

## 2019-01-13 DIAGNOSIS — E782 Mixed hyperlipidemia: Secondary | ICD-10-CM

## 2019-01-13 DIAGNOSIS — G4733 Obstructive sleep apnea (adult) (pediatric): Secondary | ICD-10-CM

## 2019-01-13 DIAGNOSIS — R0683 Snoring: Secondary | ICD-10-CM

## 2019-01-13 DIAGNOSIS — E8881 Metabolic syndrome: Secondary | ICD-10-CM

## 2019-01-13 DIAGNOSIS — G478 Other sleep disorders: Secondary | ICD-10-CM

## 2019-01-13 DIAGNOSIS — R7989 Other specified abnormal findings of blood chemistry: Secondary | ICD-10-CM

## 2019-01-21 ENCOUNTER — Telehealth: Payer: Self-pay | Admitting: Neurology

## 2019-01-21 DIAGNOSIS — R0683 Snoring: Secondary | ICD-10-CM

## 2019-01-21 DIAGNOSIS — E782 Mixed hyperlipidemia: Secondary | ICD-10-CM

## 2019-01-21 DIAGNOSIS — G4733 Obstructive sleep apnea (adult) (pediatric): Secondary | ICD-10-CM

## 2019-01-21 DIAGNOSIS — G478 Other sleep disorders: Secondary | ICD-10-CM

## 2019-01-21 NOTE — Telephone Encounter (Signed)
Called patient to discuss sleep study results. No answer at this time. LVM for the patient to call back.   

## 2019-01-21 NOTE — Telephone Encounter (Signed)
Pt returning call please call back °

## 2019-01-21 NOTE — Addendum Note (Signed)
Addended by: Darleen Crocker on: 01/21/2019 09:12 AM   Modules accepted: Orders

## 2019-01-21 NOTE — Telephone Encounter (Signed)
I called pt. I advised pt that Dr. Brett Fairy reviewed their sleep study results and found that pt has sleep apnea. Dr. Brett Fairy recommends that pt start auto CPAP. I reviewed PAP compliance expectations with the pt. Pt is agreeable to starting a CPAP. I advised pt that an order will be sent to a DME, [facility], and [facility] will call the pt within about one week after they file with the pt's insurance. [facility] will show the pt how to use the machine, fit for masks, and troubleshoot the CPAP if needed. A follow up appt was made for insurance purposes with in 31-90 days after starting the CPAP. Pt verbalized understanding of results. Pt had no questions at this time but was encouraged to call back if questions arise. I have sent the order to [facility] and have received confirmation that they have received the order.  Pt is asking with this being mild apnea if she would recommend dental device as another option. Advised the patient I can certainly ask her opinion and thoughts and let her know once she is back from vacation. Pt verbalized understanding.

## 2019-02-03 ENCOUNTER — Encounter: Payer: Self-pay | Admitting: Neurology

## 2019-02-03 NOTE — Telephone Encounter (Signed)
Called the patient to make her aware that I reviewed the the sleep study with Dr Brett Fairy and she was ok with placing a referral for dental device. I will enter that in and then the pt can decide which one is better affordable to her to treat her apnea. Pt verbalized understanding.'

## 2019-02-03 NOTE — Addendum Note (Signed)
Addended by: Darleen Crocker on: 02/03/2019 09:17 AM   Modules accepted: Orders

## 2019-02-10 NOTE — Telephone Encounter (Signed)
Received a notification from [place],  "Patient walked into office today, 02/10/19, and returned device. Patient told Caryl Pina that she has been getting headaches and can't sleep with it.  Setup date was: 01/29/19"  Patient returned her CPAP device.

## 2019-02-14 ENCOUNTER — Other Ambulatory Visit: Payer: Self-pay | Admitting: *Deleted

## 2019-02-14 MED ORDER — PREMARIN 0.625 MG/GM VA CREA: TOPICAL_CREAM | VAGINAL | 0 refills | Status: DC

## 2019-02-14 NOTE — Telephone Encounter (Signed)
Medication refill request:  Premarin vag cream  Last AEX:  03/08/18 Dr. [name] Next AEX: 03/19/19  Last MMG (if hormonal medication request): 01/13/19 BIRADS1:neg  Refill authorized: 11/14/18 #30g/1R. [facility]   [facility] Please advise.

## 2019-03-18 NOTE — Progress Notes (Signed)
53 y.o. G67P2002 Married Caucasian female here for annual exam.    Uses Premarin once every 2 - 3 weeks.  She is not having pain with intercourse. Not menopausal by [facility] and E2 in June 2019.  No having hot flashes or night sweats.   Not using valisone ointment for lichen planus.  Not having any itchiness anymore.   She started a weight loss program.  Lost about 20 pounds.   Son marrying this weekend in [place].  PCP:   Helane Rima, DO  Patient's last menstrual period was 08/08/2018 (exact date).           Sexually active: Yes.    The current method of family planning is vasectomy/ablation.    Exercising: Yes.    weights Smoker:  no  Health Maintenance: Pap: 11-28-17 Neg:Neg HR HPV, 11-26-14 Neg History of abnormal Pap:  no MMG: 01-13-19 Neg/density B/BiRads1 Colonoscopy:  11/29/2016 Dr Loreta Ave follow up 10 years BMD:   n/a  Result  n/a TDaP: 03-08-18 Gardasil:   n/a  HIV:Neg during pregnancy Hep C: 03-07-17 Neg Screening Labs:  PCP.    reports that she has never smoked. She has never used smokeless tobacco. She reports that she does not drink alcohol or use drugs.  Past Medical History:  Diagnosis Date  . Elevated hemoglobin A1c 2020  . Hypercholesteremia   . Kidney stones   . 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 Migraines   . Subclinical hypothyroidism 4/11    Past Surgical History:  Procedure Laterality Date  . LITHOTRIPSY    . NOVASURE ABLATION  12-18-06  . ROTATOR CUFF REPAIR Right 2009    Current Outpatient Medications  Medication Sig Dispense Refill  . cholecalciferol (VITAMIN D3) 25 MCG (1000 UT) tablet Take 1,000 Units by mouth daily.    Marland Kitchen conjugated estrogens (PREMARIN) vaginal cream Use 1/2 g vaginally two times per week. 30 g 0  . magnesium 30 MG tablet Take 30 mg by mouth 2 (two) times daily.    . Omega-3 Fatty Acids (FISH OIL PO) Take by mouth daily.     No current facility-administered medications for this visit.     Family History  Problem Relation  Age of Onset  . Diabetes Mother   . Cancer Paternal Grandmother        colon    Review of Systems  All other systems reviewed and are negative.   Exam:   BP 122/80 (Cuff Size: Large)   Pulse 80   Temp 97.6 F (36.4 C) (Temporal)   Resp 18   Ht 5' 1.5" (1.562 m)   Wt 192 lb (87.1 kg)   LMP 08/08/2018 (Exact Date)   BMI 35.69 kg/m     General appearance: alert, cooperative and appears stated age Head: normocephalic, without obvious abnormality, atraumatic Neck: no adenopathy, supple, symmetrical, trachea midline and thyroid normal to inspection and palpation Lungs: clear to auscultation bilaterally Breasts: normal appearance, no masses or tenderness, No nipple retraction or dimpling, No nipple discharge or bleeding, No axillary adenopathy Heart: regular rate and rhythm Abdomen: soft, non-tender; no masses, no organomegaly Extremities: extremities normal, atraumatic, no cyanosis or edema Skin: skin color, texture, turgor normal. No rashes or lesions Lymph nodes: cervical, supraclavicular, and axillary nodes normal. Neurologic: grossly normal  Pelvic: External genitalia:  no lesions              No abnormal inguinal nodes palpated.  Urethra:  normal appearing urethra with no masses, tenderness or lesions              Bartholins and Skenes: normal                 Vagina: normal appearing vagina with normal color and discharge, no lesions              Cervix: no lesions              Pap taken: No. Bimanual Exam:  Uterus:  normal size, contour, position, consistency, mobility, non-tender              Adnexa: no mass, fullness, tenderness              Rectal exam: Yes.  .  Confirms.              Anus:  normal sphincter tone, no lesions  Chaperone was present for exam.  Assessment:   Well woman visit with normal exam. Status post endometrial ablation.  Perimenopausal female. Introital pain with intercourse.  Resolved with vaginal estrogen.  Hx lichen planus of  the vulva.  Elevated A1C.  Weight loss.   Plan: Mammogram screening discussed. Self breast awareness reviewed. Pap and HR HPV as above. Guidelines for Calcium, Vitamin D, regular exercise program including cardiovascular and weight bearing exercise. Premarin vaginal cream.  I discussed effects on breast cancer.  Follow up annually and prn.   After visit summary provided.

## 2019-03-19 ENCOUNTER — Encounter: Payer: Self-pay | Admitting: Obstetrics and Gynecology

## 2019-03-19 ENCOUNTER — Ambulatory Visit: Payer: BC Managed Care – PPO | Admitting: Obstetrics and Gynecology

## 2019-03-19 ENCOUNTER — Other Ambulatory Visit: Payer: Self-pay

## 2019-03-19 VITALS — BP 122/80 | HR 80 | Temp 97.6°F | Resp 18 | Ht 61.5 in | Wt 192.0 lb

## 2019-03-19 DIAGNOSIS — Z01419 Encounter for gynecological examination (general) (routine) without abnormal findings: Secondary | ICD-10-CM | POA: Diagnosis not present

## 2019-03-19 MED ORDER — PREMARIN 0.625 MG/GM VA CREA: TOPICAL_CREAM | VAGINAL | 2 refills | Status: DC

## 2019-03-19 NOTE — Patient Instructions (Signed)

## 2019-04-09 ENCOUNTER — Encounter: Payer: Self-pay | Admitting: Obstetrics and Gynecology

## 2019-04-09 ENCOUNTER — Telehealth: Payer: Self-pay | Admitting: Obstetrics and Gynecology

## 2019-04-09 NOTE — Progress Notes (Addendum)
GYNECOLOGY  VISIT - Note by Janean Sark, MD   HPI: 53 y.o.   Married  Caucasian  female   G2P2002 with No LMP recorded. Patient has had an ablation. here for vasomotor symptoms.    Patient complaining of hot flashes, insomnia, irritability.  Has lost 20 pounds.  A1C was 6.2 on 12/06/18.  TSH 4.010.  GYNECOLOGIC HISTORY: No LMP recorded. Patient has had an ablation. Contraception: Vasectomy/ablation Menopausal hormone therapy:  none Last mammogram: 01-13-19 Neg/density B/BiRads1 Last pap smear: 11-28-17 Neg:Neg HR HPV, 11-26-14 Neg        OB History    Gravida  2   Para  2   Term  2   Preterm      AB      Living  2     SAB      TAB      Ectopic      Multiple      Live Births                 Patient Active Problem List   Diagnosis Date Noted  . Elevated LFTs 12/12/2018  . Mixed hyperlipidemia 12/12/2018  . 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 Vitamin D deficiency 11/24/2018  . Insulin resistance 11/24/2018  . OSA (obstructive sleep apnea) 11/24/2018  . Morbid obesity (HCC) 11/24/2018    Past Medical History:  Diagnosis Date  . Elevated hemoglobin A1c 2020  . Hypercholesteremia   . Kidney stones   . 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 Migraines   . Subclinical hypothyroidism 4/11    Past Surgical History:  Procedure Laterality Date  . LITHOTRIPSY    . NOVASURE ABLATION  12-18-06  . ROTATOR CUFF REPAIR Right 2009    Current Outpatient Medications  Medication Sig Dispense Refill  . cholecalciferol (VITAMIN D3) 25 MCG (1000 UT) tablet Take 1,000 Units by mouth daily.    Marland Kitchen conjugated estrogens (PREMARIN) vaginal cream Use 1/2 g vaginally two times per week. 30 g 2  . magnesium 30 MG tablet Take 30 mg by mouth 2 (two) times daily.    . Omega-3 Fatty Acids (FISH OIL PO) Take by mouth daily.     No current facility-administered medications for this visit.      ALLERGIES: Zanaflex [tizanidine hcl] and Biaxin [clarithromycin]  Family History   Problem Relation Age of Onset  . Diabetes Mother   . Cancer Paternal Grandmother        colon    Social History   Socioeconomic History  . Marital status: Married    Spouse name: Not on file  . Number of children: Not on file  . Years of education: Not on file  . Highest education level: Not on file  Occupational History  . Not on file  Social Needs  . Financial resource strain: Not on file  . Food insecurity    Worry: Not on file    Inability: Not on file  . Transportation needs    Medical: Not on file    Non-medical: Not on file  Tobacco Use  . Smoking status: Never Smoker  . Smokeless tobacco: Never Used  Substance and Sexual Activity  . Alcohol use: No    Alcohol/week: 0.0 standard drinks  . Drug use: No  . Sexual activity: Yes    Partners: Male    Birth control/protection: Surgical    Comment: Ablation, Vasectomy  Lifestyle  . Physical activity    Days per week: Not  on file    Minutes per session: Not on file  . Stress: Not on file  Relationships  . Social Herbalist on phone: Not on file    Gets together: Not on file    Attends religious service: Not on file    Active member of club or organization: Not on file    Attends meetings of clubs or organizations: Not on file    Relationship status: Not on file  . Intimate partner violence    Fear of current or ex partner: Not on file    Emotionally abused: Not on file    Physically abused: Not on file    Forced sexual activity: Not on file  Other Topics Concern  . Not on file  Social History Narrative  . Not on file    Review of Systems  Constitutional:       Insomnia  Psychiatric/Behavioral: Positive for agitation.  All other systems reviewed and are negative.   PHYSICAL EXAMINATION:    BP 110/74 (Cuff Size: Large)   Pulse 70   Temp (!) 97.5 F (36.4 C) (Temporal)   Ht 5' 1.75" (1.568 m)   Wt 194 lb 9.6 oz (88.3 kg)   BMI 35.88 kg/m     General appearance: alert, cooperative and  appears stated age   ASSESSMENT  Menopausal symptoms.  Status post endometrial ablation.   PLAN  Check FSH and Estradiol.  We discussed menopausal symptoms, health changes in menopause, and treatment options.  We reviewed HRT, SSRIs, SNRIs, gabapentin, herbal options.  Risks and benefits reviewed.  Patient will start Vivelle Dot 0.05 mg twice weekly  #24, RF none and Prometrium 100 mg po q hs #90, RF none.   Discused WHI and use of HRT which can increase risk of PE, DVT, MI, stroke and breast cancer.  She will continue her vaginal estrogen cream. Brochure on menopause.  Fu in 6 weeks.  ___25___ minutes face to face time of which over 50% was spent in counseling.

## 2019-04-09 NOTE — Telephone Encounter (Signed)
Call to patient. Patient states her hot flashes seem to be getting worse to the point she is having trouble sleeping. RN advised OV recommended. Patient agreeable. Patient scheduled for 04-10-2019 at 0830. Patient agreeable to date and time of appointment. Covid prescreening negative.   Routing to provider and will close encounter.

## 2019-04-09 NOTE — Telephone Encounter (Signed)
Patient sent the following correspondence through [facility].  I have started having the hot flashes with major irritability and anxiety.  The hot flashes started out a few here and there but have multiplied.  Not sure if these are flowing through to not being able to sleep but I don't sleep well at all.  At my last visit, you said that there may be some relief if this started.  Please advise.  If I need to have office visit, in person on virtual, is okay.

## 2019-04-10 ENCOUNTER — Ambulatory Visit (INDEPENDENT_AMBULATORY_CARE_PROVIDER_SITE_OTHER): Payer: BC Managed Care – PPO | Admitting: Obstetrics and Gynecology

## 2019-04-10 ENCOUNTER — Encounter: Payer: Self-pay | Admitting: Obstetrics and Gynecology

## 2019-04-10 ENCOUNTER — Other Ambulatory Visit: Payer: Self-pay

## 2019-04-10 VITALS — BP 110/74 | HR 70 | Temp 97.5°F | Ht 61.75 in | Wt 194.6 lb

## 2019-04-10 DIAGNOSIS — N951 Menopausal and female climacteric states: Secondary | ICD-10-CM

## 2019-04-10 MED ORDER — ESTRADIOL 0.05 MG/24HR TD PTTW: 1.0000 | MEDICATED_PATCH | TRANSDERMAL | 0 refills | Status: DC

## 2019-04-10 MED ORDER — PROGESTERONE MICRONIZED 100 MG PO CAPS: 100.0000 mg | ORAL_CAPSULE | Freq: Every day | ORAL | 0 refills | Status: DC

## 2019-04-11 LAB — ESTRADIOL: Estradiol: <5 pg/mL

## 2019-04-11 LAB — FOLLICLE STIMULATING HORMONE: FSH: 81.2 m[IU]/mL

## 2019-05-05 ENCOUNTER — Other Ambulatory Visit: Payer: Self-pay | Admitting: Podiatry

## 2019-05-05 ENCOUNTER — Other Ambulatory Visit: Payer: Self-pay

## 2019-05-05 ENCOUNTER — Ambulatory Visit: Payer: BC Managed Care – PPO | Admitting: Podiatry

## 2019-05-05 ENCOUNTER — Ambulatory Visit (INDEPENDENT_AMBULATORY_CARE_PROVIDER_SITE_OTHER): Payer: BC Managed Care – PPO

## 2019-05-05 DIAGNOSIS — M659 Synovitis and tenosynovitis, unspecified: Secondary | ICD-10-CM | POA: Diagnosis not present

## 2019-05-05 DIAGNOSIS — M79672 Pain in left foot: Secondary | ICD-10-CM

## 2019-05-05 MED ORDER — MELOXICAM 15 MG PO TABS: 15.0000 mg | ORAL_TABLET | Freq: Every day | ORAL | 1 refills | Status: DC

## 2019-05-05 NOTE — Patient Instructions (Signed)
Powerstep insoles on [employer] 

## 2019-05-08 NOTE — Progress Notes (Signed)
   HPI: 53 y.o. female presenting today as a new patient with a chief complaint of burning pain to the dorsal left foot that began 6 months ago. She reports associated swelling of the foot. She also reports pain in the lateral right ankle that began 6 months ago. She states it feels as if it is going to "pop" and feels weak. She has not had any treatment of the symptoms. Walking increases her symptoms. Patient is here for further evaluation and treatment.   Past Medical History:  Diagnosis Date  . Elevated hemoglobin A1c 2020  . Hypercholesteremia   . Kidney stones   . 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 Migraines   . Subclinical hypothyroidism 4/11     Physical Exam: General: The patient is alert and oriented x3 in no acute distress.  Dermatology: Skin is warm, dry and supple bilateral lower extremities. Negative for open lesions or macerations.  Vascular: Palpable pedal pulses bilaterally. No edema or erythema noted. Capillary refill within normal limits.  Neurological: Epicritic and protective threshold grossly intact bilaterally.   Musculoskeletal Exam: Metatarsus adductus noted bilaterally.  Pain with palpation noted to the lateral left extensor tendon sheath.  Pain with palpation to the peroneal tendon of the right foot. Range of motion within normal limits to all pedal and ankle joints bilateral. Muscle strength 5/5 in all groups bilateral.   Radiographic Exam:  Metatarsus adductus noted bilaterally. Normal osseous mineralization. Joint spaces preserved. No fracture/dislocation/boney destruction.    Assessment: 1. Lateral left foot extensor tendinitis  2. Right peroneal tendinitis  3. Metatarsus adductus bilateral    Plan of Care:  1. Patient evaluated. X-Rays reviewed.  2. Injection of 0.5 mLs Celestone Soluspan injected into the lateral left foot along the extensor tendon sheath.  3. Prescription for Meloxicam provided to patient. 4. Recommended OTC Powerstep insoles.   5. Continue wearing Vionic shoes and sandals.  6. Return to clinic as needed.   CPA.       Edrick Kins, DPM [facility]  Dr. Edrick Kins, DPM    [address], [place] [address]                Office [phone]  Fax [phone]

## 2019-05-09 ENCOUNTER — Encounter: Payer: Self-pay | Admitting: Podiatry

## 2019-05-26 ENCOUNTER — Other Ambulatory Visit: Payer: Self-pay

## 2019-05-27 NOTE — Progress Notes (Signed)
GYNECOLOGY  VISIT   HPI: 53 y.o.   Married  Caucasian  female   G2P2002 with No LMP recorded. Patient has had an ablation. here for 6 week follow up after beginning HRT.   States she is not much better.  Still with hot flashes, not sleeping, and having stress and feels heaviness in her chest when this occurs.  States she does not feel this daily. She does not want to take something daily for prevention.   Hot flashes are better, meaning less frequent.   Stress is keeping her up at night.  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  She took a Lorazepam of a family member and was able to sleep well. States she took Xanax in the past for PMS.  States she does not need to take medication daily.   Denies depression and suicidal thoughts.   Has gained 3 - 4 pounds since starting the HRT.  No other explanation for this per patient. She is very nervous about taking HRT for fear of breast cancer, checking breasts several times per day.   Reports she feels unprepared for menopause until recently.  GYNECOLOGIC HISTORY: No LMP recorded. Patient has had an ablation. Contraception:  Vasectomy/ablation Menopausal hormone therapy: Vivelle Dot 0.05mg  and Prometrium 100mg , estrogen cream Last mammogram:  01-13-19 Neg/density B/BiRads1 Last pap smear: 11-28-17 Neg:Neg HR HPV, 11-26-14 Neg        OB History    Gravida  2   Para  2   Term  2   Preterm      AB      Living  2     SAB      TAB      Ectopic      Multiple      Live Births                 Patient Active Problem List   Diagnosis Date Noted  . Elevated LFTs 12/12/2018  . Mixed hyperlipidemia 12/12/2018  . 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 Vitamin D deficiency 11/24/2018  . Insulin resistance 11/24/2018  . OSA (obstructive sleep apnea) 11/24/2018  . Morbid obesity (HCC) 11/24/2018    Past Medical History:  Diagnosis Date  . Elevated hemoglobin A1c 2020  . Hypercholesteremia   . Kidney  stones   . 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 Migraines   . Subclinical hypothyroidism 4/11    Past Surgical History:  Procedure Laterality Date  . LITHOTRIPSY    . NOVASURE ABLATION  12-18-06  . ROTATOR CUFF REPAIR Right 2009    Current Outpatient Medications  Medication Sig Dispense Refill  . cholecalciferol (VITAMIN D3) 25 MCG (1000 UT) tablet Take 1,000 Units by mouth daily.    2010 conjugated estrogens (PREMARIN) vaginal cream Use 1/2 g vaginally two times per week. 30 g 2  . estradiol (VIVELLE-DOT) 0.05 MG/24HR patch Place 1 patch (0.05 mg total) onto the skin 2 (two) times a week. 24 patch 0  . magnesium 30 MG tablet Take 30 mg by mouth 2 (two) times daily.    . meloxicam (MOBIC) 15 MG tablet Take 1 tablet (15 mg total) by mouth daily. 30 tablet 1  . Omega-3 Fatty Acids (FISH OIL PO) Take by mouth daily.    . progesterone (PROMETRIUM) 100 MG capsule Take 1 capsule (100 mg total) by mouth daily. 90 capsule 0   No current facility-administered medications for this visit.  ALLERGIES: Zanaflex [tizanidine hcl] and Biaxin [clarithromycin]  Family History  Problem Relation Age of Onset  . Diabetes Mother   . Cancer Paternal Grandmother        colon    Social History   Socioeconomic History  . Marital status: Married    Spouse name: Not on file  . Number of children: Not on file  . Years of education: Not on file  . Highest education level: Not on file  Occupational History  . Not on file  Social Needs  . Financial resource strain: Not on file  . Food insecurity    Worry: Not on file    Inability: Not on file  . Transportation needs    Medical: Not on file    Non-medical: Not on file  Tobacco Use  . Smoking status: Never Smoker  . Smokeless tobacco: Never Used  Substance and Sexual Activity  . Alcohol use: No    Alcohol/week: 0.0 standard drinks  . Drug use: No  . Sexual activity: Yes    Partners: Male    Birth control/protection: Surgical    Comment:  Ablation, Vasectomy  Lifestyle  . Physical activity    Days per week: Not on file    Minutes per session: Not on file  . Stress: Not on file  Relationships  . Social Herbalist on phone: Not on file    Gets together: Not on file    Attends religious service: Not on file    Active member of club or organization: Not on file    Attends meetings of clubs or organizations: Not on file    Relationship status: Not on file  . Intimate partner violence    Fear of current or ex partner: Not on file    Emotionally abused: Not on file    Physically abused: Not on file    Forced sexual activity: Not on file  Other Topics Concern  . Not on file  Social History Narrative  . Not on file    Review of Systems  All other systems reviewed and are negative.   PHYSICAL EXAMINATION:    BP 120/80 (Cuff Size: Large)   Pulse 88   Temp (!) 97.2 F (36.2 C) (Temporal)   Ht 5' 1.75" (1.568 m)   Wt 199 lb 9.6 oz (90.5 kg)   BMI 36.80 kg/m     General appearance: alert, cooperative and appears stated age.  ASSESSMENT  Menopausal symptoms. On HRT.  Anxiety with sleep disturbance. Aunt with breast cancer.   PLAN  She will continue with HRT. Refills given until due for her annual exam. She will let me know if she wants to change to Vagifem.  Rx for Xanax 0.25 mg po tid prn insomnia or anxiety.  This will be a limited prescription.  Patient aware of this.  If her anxiety continues, will consider medication like Paxil.  She will let me know if she chooses to stop her HRT. Continue yearly mammogram.   An After Visit Summary was printed and given to the patient.  __25____ minutes face to face time of which over 50% was spent in counseling.

## 2019-05-28 ENCOUNTER — Other Ambulatory Visit: Payer: Self-pay

## 2019-05-28 ENCOUNTER — Ambulatory Visit: Payer: BC Managed Care – PPO | Admitting: Obstetrics and Gynecology

## 2019-05-28 ENCOUNTER — Encounter: Payer: Self-pay | Admitting: Obstetrics and Gynecology

## 2019-05-28 VITALS — BP 120/80 | HR 88 | Temp 97.2°F | Ht 61.75 in | Wt 199.6 lb

## 2019-05-28 DIAGNOSIS — Z79890 Hormone replacement therapy (postmenopausal): Secondary | ICD-10-CM

## 2019-05-28 DIAGNOSIS — G479 Sleep disorder, unspecified: Secondary | ICD-10-CM

## 2019-05-28 MED ORDER — ESTRADIOL 0.05 MG/24HR TD PTTW: 1.0000 | MEDICATED_PATCH | TRANSDERMAL | 2 refills | Status: DC

## 2019-05-28 MED ORDER — PROGESTERONE MICRONIZED 100 MG PO CAPS: 100.0000 mg | ORAL_CAPSULE | Freq: Every day | ORAL | 2 refills | Status: DC

## 2019-05-28 MED ORDER — ALPRAZOLAM 0.25 MG PO TABS: 0.2500 mg | ORAL_TABLET | Freq: Three times a day (TID) | ORAL | 0 refills | Status: DC | PRN

## 2019-05-29 ENCOUNTER — Encounter: Payer: Self-pay | Admitting: Physician Assistant

## 2019-05-29 ENCOUNTER — Ambulatory Visit (INDEPENDENT_AMBULATORY_CARE_PROVIDER_SITE_OTHER): Payer: BC Managed Care – PPO | Admitting: Physician Assistant

## 2019-05-29 VITALS — BP 110/80 | HR 74 | Temp 98.0°F | Ht 61.75 in | Wt 200.0 lb

## 2019-05-29 DIAGNOSIS — R911 Solitary pulmonary nodule: Secondary | ICD-10-CM | POA: Diagnosis not present

## 2019-05-29 NOTE — Patient Instructions (Signed)
It was great to see you!  We will call you with your CT scan appt.  Let's follow-up in 3 months, sooner if you have concerns.  Take care,  Inda Coke PA-C

## 2019-05-29 NOTE — Progress Notes (Signed)
Taylor RacerSandra A [name] is a 53 y.o. female is here for Transfer of care.  I acted as a Neurosurgeonscribe for Energy East CorporationSamantha Ladeja Pelham, PA-C Corky Mullonna Orphanos, LPN  History of Present Illness:   Chief Complaint  Patient presents with  . Transfer of care    from Dr. Earlene PlaterWallace    HPI   Pt is here today for transfer of care.  She had cardiac CT scoring done in June 2020 which had an incidental finding of 4mm nodule in R oblique fissure and 6mm nodule in lingula. "Non-contrast chest CT at 6-12 months is recommended. If the nodule is stable at time of repeat CT, then future CT at 18-24 months (from today's scan) is considered optional for low-risk patients, but is recommended for high-risk patients."  She is a never smoker. Denies: fevers, chills, unusual fatigue, unintentional weight loss, unexplained cough.  Pt would like to discuss getting repeat imaging from CT done in 11/2018, suppose to repeat in 6 months.  Wt Readings from Last 5 Encounters:  05/29/19 200 lb (90.7 kg)  05/28/19 199 lb 9.6 oz (90.5 kg)  04/10/19 194 lb 9.6 oz (88.3 kg)  03/19/19 192 lb (87.1 kg)  12/16/18 212 lb (96.2 kg)     Health Maintenance Due  Topic Date Due  . HIV Screening  01/23/1981    Past Medical History:  Diagnosis Date  . Elevated hemoglobin A1c 2020  . Hypercholesteremia   . Kidney stones   . 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 Migraines   . Subclinical hypothyroidism 4/11     Social History   Socioeconomic History  . Marital status: Married    Spouse name: Not on file  . Number of children: Not on file  . Years of education: Not on file  . Highest education level: Not on file  Occupational History  . Not on file  Tobacco Use  . Smoking status: Never Smoker  . Smokeless tobacco: Never Used  Substance and Sexual Activity  . Alcohol use: No    Alcohol/week: 0.0 standard drinks  . Drug use: No  . Sexual activity: Yes    Partners: Male    Birth control/protection: Surgical    Comment: Ablation,  Vasectomy  Other Topics Concern  . Not on file  Social History Narrative  . Not on file   Social Determinants of Health   Financial Resource Strain:   . Difficulty of Paying Living Expenses: Not on file  Food Insecurity:   . Worried About Programme researcher, broadcasting/film/videounning Out of Food in the Last Year: Not on file  . Ran Out of Food in the Last Year: Not on file  Transportation Needs:   . Lack of Transportation (Medical): Not on file  . Lack of Transportation (Non-Medical): Not on file  Physical Activity:   . Days of Exercise per Week: Not on file  . Minutes of Exercise per Session: Not on file  Stress:   . Feeling of Stress : Not on file  Social Connections:   . Frequency of Communication with Friends and Family: Not on file  . Frequency of Social Gatherings with Friends and Family: Not on file  . Attends Religious Services: Not on file  . Active Member of Clubs or Organizations: Not on file  . Attends BankerClub or Organization Meetings: Not on file  . Marital Status: Not on file  Intimate Partner Violence:   . Fear of Current or Ex-Partner: Not on file  . Emotionally Abused: Not on file  .  Physically Abused: Not on file  . Sexually Abused: Not on file    Past Surgical History:  Procedure Laterality Date  . LITHOTRIPSY    . NOVASURE ABLATION  12-18-06  . ROTATOR CUFF REPAIR Right 2009    Family History  Problem Relation Age of Onset  . Diabetes Mother   . Cancer Paternal Grandmother        colon    PMHx, SurgHx, SocialHx, FamHx, Medications, and Allergies were reviewed in the Visit Navigator and updated as appropriate.   Patient Active Problem List   Diagnosis Date Noted  . Elevated LFTs 12/12/2018  . Mixed hyperlipidemia 12/12/2018  . 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 Insulin resistance 11/24/2018  . OSA (obstructive sleep apnea) 11/24/2018  . Morbid obesity (HCC) 11/24/2018    Social History   Tobacco Use  . Smoking status: Never Smoker  . Smokeless tobacco: Never Used  Substance Use  Topics  . Alcohol use: No    Alcohol/week: 0.0 standard drinks  . Drug use: No    Current Medications and Allergies:    Current Outpatient Medications:  .  ALPRAZolam (XANAX) 0.25 MG tablet, Take 1 tablet (0.25 mg total) by mouth 3 (three) times daily as needed for anxiety or sleep., Disp: 30 tablet, Rfl: 0 .  cholecalciferol (VITAMIN D3) 25 MCG (1000 UT) tablet, Take 1,000 Units by mouth daily., Disp: , Rfl:  .  conjugated estrogens (PREMARIN) vaginal cream, Use 1/2 g vaginally two times per week., Disp: 30 g, Rfl: 2 .  estradiol (VIVELLE-DOT) 0.05 MG/24HR patch, Place 1 patch (0.05 mg total) onto the skin 2 (two) times a week., Disp: 24 patch, Rfl: 2 .  magnesium 30 MG tablet, Take 30 mg by mouth 2 (two) times daily., Disp: , Rfl:  .  meloxicam (MOBIC) 15 MG tablet, Take 1 tablet (15 mg total) by mouth daily., Disp: 30 tablet, Rfl: 1 .  Omega-3 Fatty Acids (FISH OIL PO), Take by mouth daily., Disp: , Rfl:  .  progesterone (PROMETRIUM) 100 MG capsule, Take 1 capsule (100 mg total) by mouth daily., Disp: 90 capsule, Rfl: 2   Allergies  Allergen Reactions  . Zanaflex [Tizanidine Hcl]     Cant move  . Biaxin [Clarithromycin]     Severe stomach cramps    Review of Systems   ROS  Negative unless otherwise specified per HPI.  Vitals:   Vitals:   05/29/19 0807  BP: 110/80  Pulse: 74  Temp: 98 F (36.7 C)  TempSrc: Temporal  SpO2: 96%  Weight: 200 lb (90.7 kg)  Height: 5' 1.75" (1.568 m)     Body mass index is 36.88 kg/m.   Physical Exam:    Physical Exam Vitals and nursing note reviewed.  Constitutional:      General: She is not in acute distress.    Appearance: She is well-developed. She is not ill-appearing or toxic-appearing.  Cardiovascular:     Rate and Rhythm: Normal rate and regular rhythm.     Pulses: Normal pulses.     Heart sounds: Normal heart sounds, S1 normal and S2 normal.     Comments: No LE edema Pulmonary:     Effort: Pulmonary effort is  normal.     Breath sounds: Normal breath sounds.  Skin:    General: Skin is warm and dry.  Neurological:     Mental Status: She is alert.     GCS: GCS eye subscore is 4. GCS verbal subscore is 5. GCS motor subscore  is 6.  Psychiatric:        Speech: Speech normal.        Behavior: Behavior normal. Behavior is cooperative.      Assessment and Plan:    Aubryn was seen today for transfer of care.  Diagnoses and all orders for this visit:  Lung nodule seen on imaging study -     CT CHEST NODULE FOLLOW UP LOW DOSE W/O; Future   Will order chest CT for nodule follow-up. No red flags on history or exam.  . Reviewed expectations re: course of current medical issues. . Discussed self-management of symptoms. . Outlined signs and symptoms indicating need for more acute intervention. . Patient verbalized understanding and all questions were answered. . See orders for this visit as documented in the electronic medical record. . Patient received an After Visit Summary.  CMA or LPN served as scribe during this visit. History, Physical, and Plan performed by medical provider. The above documentation has been reviewed and is accurate and complete.  Inda Coke, PA-C [place] [place] 05/29/2019  Follow-up: No follow-ups on file.

## 2019-06-04 ENCOUNTER — Other Ambulatory Visit: Payer: Self-pay | Admitting: Physician Assistant

## 2019-06-04 DIAGNOSIS — R911 Solitary pulmonary nodule: Secondary | ICD-10-CM

## 2019-06-09 ENCOUNTER — Encounter: Payer: Self-pay | Admitting: Physician Assistant

## 2019-06-10 ENCOUNTER — Telehealth: Payer: Self-pay | Admitting: Physician Assistant

## 2019-06-10 NOTE — Telephone Encounter (Signed)
Please call patient.  CT scan of chest was reviewed and she has one 48mm nodule that was recommended to have no follow-up. No further work-up at this time.  [name]

## 2019-06-11 NOTE — Telephone Encounter (Signed)
Called patient reviewed information answered all questions will call if any changes.

## 2019-07-03 ENCOUNTER — Encounter: Payer: Self-pay | Admitting: Physician Assistant

## 2019-07-04 ENCOUNTER — Other Ambulatory Visit: Payer: Self-pay | Admitting: Podiatry

## 2019-09-08 ENCOUNTER — Encounter: Payer: Self-pay | Admitting: Certified Nurse Midwife

## 2019-10-08 ENCOUNTER — Ambulatory Visit: Payer: BC Managed Care – PPO | Admitting: Primary Care

## 2019-10-08 ENCOUNTER — Encounter: Payer: Self-pay | Admitting: Primary Care

## 2019-10-08 ENCOUNTER — Other Ambulatory Visit: Payer: Self-pay

## 2019-10-08 DIAGNOSIS — G479 Sleep disorder, unspecified: Secondary | ICD-10-CM

## 2019-10-08 DIAGNOSIS — G4733 Obstructive sleep apnea (adult) (pediatric): Secondary | ICD-10-CM

## 2019-10-08 DIAGNOSIS — M79673 Pain in unspecified foot: Secondary | ICD-10-CM | POA: Diagnosis not present

## 2019-10-08 DIAGNOSIS — G8929 Other chronic pain: Secondary | ICD-10-CM | POA: Insufficient documentation

## 2019-10-08 DIAGNOSIS — R7303 Prediabetes: Secondary | ICD-10-CM

## 2019-10-08 DIAGNOSIS — E782 Mixed hyperlipidemia: Secondary | ICD-10-CM | POA: Diagnosis not present

## 2019-10-08 HISTORY — DX: Sleep disorder, unspecified: G47.9

## 2019-10-08 NOTE — Assessment & Plan Note (Signed)
Recent LDL of 147 which is an improvement from June 2020 (155). Encouraged weight loss, regular exercise. Delrae Rend MD completing labs.

## 2019-10-08 NOTE — Progress Notes (Signed)
Subjective:    Patient ID: [name], female    DOB: [birth year], 54 y.o.   MRN: [id number]  HPI  This visit occurred during the SARS-CoV-2 public health emergency.  Safety protocols were in place, including screening questions prior to the visit, additional usage of staff PPE, and extensive cleaning of exam room while observing appropriate contact time as indicated for disinfecting solutions.   [name] is a 54 year old female who presents today to transfer care from [facility] and discuss the problems mentioned below. Will obtain/review records.  1) Lung Nodule: Incidental finding from cardiac CT scoring which was completed in June 2020. Nodule was 4 mm in right oblique fissure and also 6 mm in lingula. Repeat imaging with non contrast chest CT was recommended 6-12 months later, then repeat again 18-24 months from initial scan for high risk patients.  She underwent repeat CT in December 2020 which showed a 4 mm nodule to left upper lobe, no mention of right sided nodules, no further follow up was recommended. She is a non smoker.   2) Vasomotor Symptoms of Menopause: Previously managed on progesterone 100 mg, estradiol patch, alprazolam. Following with GYN. She is currently taking Estroven OTC and is doing better, but still struggles with sleeping. She falls asleep well, will wake during the night and lay there for hours or just get up. She's tried Melatonin and Benadryl without improvement.   3) Hyperlipidemia: LDL of 155 from June 2020. She underwent echocardiogram in June 2020 which showed LVEF of 60-65%, normal wall motion. Recent LDL of 142 from [facility] MD in April 2021  4) Prediabetes: A1C of 6.2 in June 2020. Once managed on Ozempic but couldn't tolerate for longer than one week. Currently following with Lyn Henri MD, labs from April 2021 with A1C of 5.7. Family history of diabetes in her mother.   5) Chronic Foot Pain: Following with Podiatry, managed on Meloxicam  as needed. Has undergone injections.   Wt Readings from Last 3 Encounters:  10/08/19 219 lb 8 oz (99.6 kg)  05/29/19 200 lb (90.7 kg)  05/28/19 199 lb 9.6 oz (90.5 kg)     Review of Systems  Respiratory: Negative for shortness of breath.   Cardiovascular: Negative for chest pain.  Gastrointestinal: Negative for constipation and diarrhea.  Psychiatric/Behavioral: Positive for sleep disturbance. The patient is not nervous/anxious.        Past Medical History:  Diagnosis Date  . Elevated hemoglobin A1c 2020  . Hypercholesteremia   . Kidney stones   . Lichen planus 7628   vulva  . Migraines   . Subclinical hypothyroidism 4/11     Social History   Socioeconomic History  . Marital status: Married    Spouse name: Not on file  . Number of children: Not on file  . Years of education: Not on file  . Highest education level: Not on file  Occupational History  . Not on file  Tobacco Use  . Smoking status: Never Smoker  . Smokeless tobacco: Never Used  Substance and Sexual Activity  . Alcohol use: No    Alcohol/week: 0.0 standard drinks  . Drug use: No  . Sexual activity: Yes    Partners: Male    Birth control/protection: Surgical    Comment: Ablation, Vasectomy  Other Topics Concern  . Not on file  Social History Narrative  . Not on file   Social Determinants of Health   Financial Resource Strain:   .  Difficulty of Paying Living Expenses:   Food Insecurity:   . Worried About Programme researcher, broadcasting/film/video in the Last Year:   . Barista in the Last Year:   Transportation Needs:   . Freight forwarder (Medical):   Marland Kitchen Lack of Transportation (Non-Medical):   Physical Activity:   . Days of Exercise per Week:   . Minutes of Exercise per Session:   Stress:   . Feeling of Stress :   Social Connections:   . Frequency of Communication with Friends and Family:   . Frequency of Social Gatherings with Friends and Family:   . Attends Religious Services:   . Active Member  of Clubs or Organizations:   . Attends Banker Meetings:   Marland Kitchen Marital Status:   Intimate Partner Violence:   . Fear of Current or Ex-Partner:   . Emotionally Abused:   Marland Kitchen Physically Abused:   . Sexually Abused:     Past Surgical History:  Procedure Laterality Date  . LITHOTRIPSY    . NOVASURE ABLATION  12-18-06  . ROTATOR CUFF REPAIR Right 2009    Family History  Problem Relation Age of Onset  . Diabetes Mother   . Cancer Paternal Grandmother        colon    Allergies  Allergen Reactions  . Zanaflex [Tizanidine Hcl]     Cant move  . Biaxin [Clarithromycin]     Severe stomach cramps    Current Outpatient Medications on File Prior to Visit  Medication Sig Dispense Refill  . cholecalciferol (VITAMIN D3) 25 MCG (1000 UT) tablet Take 1,000 Units by mouth daily.    . magnesium 30 MG tablet Take 30 mg by mouth 2 (two) times daily.    . meloxicam (MOBIC) 15 MG tablet TAKE 1 TABLET BY MOUTH EVERY DAY 30 tablet 1  . Omega-3 Fatty Acids (FISH OIL PO) Take by mouth daily.     No current facility-administered medications on file prior to visit.    BP 114/78   Pulse 88   Temp (!) 97.2 F (36.2 C) (Temporal)   Ht 5' 1.75" (1.568 m)   Wt 219 lb 8 oz (99.6 kg)   SpO2 98%   BMI 40.47 kg/m    Objective:   Physical Exam  Constitutional: She appears well-nourished.  Cardiovascular: Normal rate and regular rhythm.  Respiratory: Effort normal and breath sounds normal.  Musculoskeletal:     Cervical back: Neck supple.  Skin: Skin is warm and dry.  Psychiatric: She has a normal mood and affect.           Assessment & Plan:

## 2019-10-08 NOTE — Assessment & Plan Note (Signed)
Diagnosed with "mild" sleep apnea, has a CPAP machine but doesn't use due to headaches when waking during the morning.  She is working on weight loss, prefers not to use.

## 2019-10-08 NOTE — Patient Instructions (Signed)
Please share any new labs with me as discussed.  Try Unisom for sleep, update me if no improvement.  It was a pleasure to see you today! Please don't hesitate to call or message me with any questions. Welcome to [facility]!

## 2019-10-08 NOTE — Assessment & Plan Note (Signed)
Following with podiatry, continue Meloxicam PRN.

## 2019-10-08 NOTE — Assessment & Plan Note (Signed)
Waking during the night, difficulty falling back asleep. Could be OSA related, diagnosed with "mild OSA".  Discussed to try Unisom and update.  Consider low dose Trazodone if needed.

## 2019-10-08 NOTE — Assessment & Plan Note (Signed)
Recent A1C of 5.7 that was drawn by [facility] MD. Continue to monitor. Family history of diabetes in her mother.

## 2019-12-15 ENCOUNTER — Ambulatory Visit (INDEPENDENT_AMBULATORY_CARE_PROVIDER_SITE_OTHER): Payer: BC Managed Care – PPO | Admitting: Family Medicine

## 2019-12-15 ENCOUNTER — Encounter (INDEPENDENT_AMBULATORY_CARE_PROVIDER_SITE_OTHER): Payer: Self-pay | Admitting: Family Medicine

## 2019-12-15 ENCOUNTER — Other Ambulatory Visit: Payer: Self-pay

## 2019-12-15 VITALS — BP 132/74 | HR 78 | Temp 98.0°F | Ht 62.0 in | Wt 211.0 lb

## 2019-12-15 DIAGNOSIS — F411 Generalized anxiety disorder: Secondary | ICD-10-CM | POA: Diagnosis not present

## 2019-12-15 DIAGNOSIS — R0602 Shortness of breath: Secondary | ICD-10-CM | POA: Diagnosis not present

## 2019-12-15 DIAGNOSIS — R7303 Prediabetes: Secondary | ICD-10-CM | POA: Diagnosis not present

## 2019-12-15 DIAGNOSIS — K760 Fatty (change of) liver, not elsewhere classified: Secondary | ICD-10-CM

## 2019-12-15 DIAGNOSIS — Z1331 Encounter for screening for depression: Secondary | ICD-10-CM

## 2019-12-15 DIAGNOSIS — Z9189 Other specified personal risk factors, not elsewhere classified: Secondary | ICD-10-CM

## 2019-12-15 DIAGNOSIS — E7849 Other hyperlipidemia: Secondary | ICD-10-CM

## 2019-12-15 DIAGNOSIS — Z6838 Body mass index (BMI) 38.0-38.9, adult: Secondary | ICD-10-CM

## 2019-12-15 DIAGNOSIS — E66812 Obesity, class 2: Secondary | ICD-10-CM

## 2019-12-15 DIAGNOSIS — R5383 Other fatigue: Secondary | ICD-10-CM

## 2019-12-15 DIAGNOSIS — Z0289 Encounter for other administrative examinations: Secondary | ICD-10-CM

## 2019-12-15 NOTE — Progress Notes (Signed)
Chief Complaint:   OBESITY [name] (MR# [id number]) is a 54 y.o. female who presents for evaluation and treatment of obesity and related comorbidities. Current BMI is Body mass index is 38.59 kg/m. [name] has been struggling with her weight for many years and has been unsuccessful in either losing weight, maintaining weight loss, or reaching her healthy weight goal.  [name] is currently in the action stage of change and ready to dedicate time achieving and maintaining a healthier weight. [name] is interested in becoming our patient and working on intensive lifestyle modifications including (but not limited to) diet and exercise for weight loss.  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  Her husband had AMI 2 years ago and is trying to eat heart healthy.  She works out a lot and says she recently ordered a treadmill.  She tends not to eat enough.  She has gone to [name] in the past with minimal weight loss.  She endorses late afternoon cravings.  [name] habits were reviewed today and are as follows: Her family eats meals together, she thinks her family will eat healthier with her, her desired weight loss is 72 pounds, she has been heavy most of her life, she started gaining weight with her first pregnancy, her heaviest weight ever was 218 pounds, she craves salty, crunchy foods, she frequently makes poor food choices, she frequently eats larger portions than normal and she struggles with emotional eating.  Depression Screen [name]'s Food and Mood (modified PHQ-9) score was 15.  Depression screen [name] 2/9 12/15/2019  Decreased Interest 3  Down, Depressed, Hopeless 2  PHQ - 2 Score 5  Altered sleeping 3  Tired, decreased energy 3  Change in appetite 3  Feeling bad or failure about yourself  0  Trouble concentrating 0  Moving slowly or fidgety/restless 1  Suicidal thoughts 0  PHQ-9 Score 15    Difficult doing work/chores Not difficult at all   Subjective:   1. Other fatigue [name] denies daytime somnolence and admits to waking up still tired. Patent has a history of symptoms of morning fatigue and snoring. [name] generally gets 7 hours of sleep per night, and states that she has generally restful sleep. Snoring is present. Apneic episodes are not present. Epworth Sleepiness Score is 3.  2. Shortness of breath on exertion [name] notes increasing shortness of breath with exercising and seems to be worsening over time with weight gain. She notes getting out of breath sooner with activity than she used to. This has gotten worse recently. [name] denies shortness of breath at rest or orthopnea.  3. Nonalcoholic hepatosteatosis [name] was recently diagnosed by [name], GI.  She was diagnosed with fatty liver via [place].  4. Prediabetes [name] has a diagnosis of prediabetes based on her elevated HgA1c and was informed this puts her at greater risk of developing diabetes. She continues to work on diet and exercise to decrease her risk of diabetes. She denies nausea or hypoglycemia.  Lab Results  Component Value Date   HGBA1C 6.2 (H) 12/06/2018   5. Other hyperlipidemia [name] has hyperlipidemia and has been trying to improve her cholesterol levels with intensive lifestyle modification including a low saturated fat diet, exercise and weight loss. She denies any chest pain, claudication or myalgias.  Lab Results  Component Value Date   ALT 36 (H) 12/06/2018   AST 34 12/06/2018  ALKPHOS 92 12/06/2018   BILITOT 0.4 12/06/2018   Lab Results  Component Value Date   CHOL 257 (H) 12/06/2018   HDL 65 12/06/2018   LDLCALC 155 (H) 12/06/2018   TRIG 185 (H) 12/06/2018   CHOLHDL 3.7 03/07/2017   6. Hypomagnesemia [name] level was 2.1 on 12/06/2018.  7. Generalized anxiety disorder, with eating disorder [name] has been under stress, which leads to emotional eating.  Positive  family history of depression and GAD in 1st degree relatives.  8. Depression screening [name] was screened for depression today as part of her new patient workup.  PHQ-9 is 15.  9. At risk for diabetes mellitus [name] is at higher than average risk for developing diabetes due to her obesity and known prediabetes.  Assessment/Plan:   1. Other fatigue [name] does feel that her weight is causing her energy to be lower than it should be. Fatigue may be related to obesity, depression or many other causes. Labs will be ordered, and in the meanwhile, [name] will focus on self care including making healthy food choices, increasing physical activity and focusing on stress reduction. - EKG 12-Lead - CBC with Differential/Platelet - VITAMIN D 25 Hydroxy (Vit-D Deficiency, Fractures) - Vitamin B12 - Folate - TSH - T3, free - T4, free  2. Shortness of breath on exertion [name] does feel that she gets out of breath more easily that she used to when she exercises. [name]'s shortness of breath appears to be obesity related and exercise induced. She has agreed to work on weight loss and gradually increase exercise to treat her exercise induced shortness of breath. Will continue to monitor closely.  3. Nonalcoholic hepatosteatosis Check labs, prudent nutritional plan, weight loss. - Comprehensive metabolic panel - Lipid panel  4. Prediabetes [name] will continue to work on weight loss, exercise, and decreasing simple carbohydrates to help decrease the risk of diabetes.  Check labs, prudent nutritional plan, weight loss. - Hemoglobin A1c - Insulin, random  5. Other hyperlipidemia Cardiovascular risk and specific lipid/LDL goals reviewed.  We discussed several lifestyle modifications today and [name] will continue to work on diet, exercise and weight loss efforts. Orders and follow up as documented in patient record.  Check labs, prudent nutritional plan, weight loss.  Counseling Intensive  lifestyle modifications are the first line treatment for this issue. . Dietary changes: Increase soluble fiber. Decrease simple carbohydrates. . Exercise changes: Moderate to vigorous-intensity aerobic activity 150 minutes per week if tolerated. . Lipid-lowering medications: see documented in medical record.  6. Hypomagnesemia Check labs.  Continue supplement for now.  Will change as needed. - [name]  7. Generalized anxiety disorder, with eating disorder Patient was referred to Dr. Mallie Mussel, our Bariatric Psychologist, for evaluation due to her elevated PHQ-9 score and significant struggles with emotional eating.  Discussed with patient more mindful activities and use of Headspace and/or Calm app for daily medication.  8. Depression screening [name] had a positive depression screening. Depression is commonly associated with obesity and often results in emotional eating behaviors. We will monitor this closely and work on CBT to help improve the non-hunger eating patterns.  Referral has been placed to Dr. Mallie Mussel.  9. At risk for diabetes mellitus [name] was given approximately 15 minutes of diabetes education and counseling today. We discussed intensive lifestyle modifications today with an emphasis on weight loss as well as increasing exercise and decreasing simple carbohydrates in her diet. We also reviewed medication options with an emphasis on risk versus benefit of those  discussed.   Repetitive spaced learning was employed today to elicit superior memory formation and behavioral change.  10. Class 2 severe obesity with serious comorbidity and body mass index (BMI) of 38.0 to 38.9 in adult, unspecified obesity type [name]) [name] is currently in the action stage of change and her goal is to continue with weight loss efforts. I recommend Sharran begin the structured treatment plan as follows:  She has agreed to the Category 2 Plan.  Exercise goals: As is.   Behavioral modification  strategies: increasing lean protein intake, increasing vegetables, increasing water intake and planning for success.  She was informed of the importance of frequent follow-up visits to maximize her success with intensive lifestyle modifications for her multiple health conditions. She was informed we would discuss her lab results at her next visit unless there is a critical issue that needs to be addressed sooner. Winta agreed to keep her next visit at the agreed upon time to discuss these results.  Objective:   Blood pressure 132/74, pulse 78, temperature 98 F (36.7 C), temperature source Oral, height 5\' 2"  (1.575 m), weight 211 lb (95.7 kg), SpO2 98 %. Body mass index is 38.59 kg/m.  EKG: Normal sinus rhythm, rate 78 bpm.  Indirect Calorimeter completed today shows a VO2 of 275 and a REE of 1913.  Her calculated basal metabolic rate is thus her basal metabolic rate is better than expected.  General: Cooperative, alert, well developed, in no acute distress. HEENT: Conjunctivae and lids unremarkable. Cardiovascular: Regular rhythm.  Lungs: Normal work of breathing. Neurologic: No focal deficits.   Lab Results  Component Value Date   CREATININE 0.71 12/06/2018   BUN 10 12/06/2018   NA 137 12/06/2018   K 4.5 12/06/2018   CL 100 12/06/2018   CO2 25 12/06/2018   Lab Results  Component Value Date   ALT 36 (H) 12/06/2018   AST 34 12/06/2018   ALKPHOS 92 12/06/2018   BILITOT 0.4 12/06/2018   Lab Results  Component Value Date   HGBA1C 6.2 (H) 12/06/2018   HGBA1C 6.0 07/30/2018   HGBA1C 5.7 (H) 03/07/2017   Lab Results  Component Value Date   TSH 4.010 12/06/2018   Lab Results  Component Value Date   CHOL 257 (H) 12/06/2018   HDL 65 12/06/2018   LDLCALC 155 (H) 12/06/2018   TRIG 185 (H) 12/06/2018   CHOLHDL 3.7 03/07/2017   Lab Results  Component Value Date   WBC 8.9 12/06/2018   HGB 13.7 12/06/2018   HCT 41.4 12/06/2018   MCV 92 12/06/2018   PLT 286  12/06/2018   Attestation Statements:   Reviewed by clinician on day of visit: allergies, medications, problem list, medical history, surgical history, family history, social history, and previous encounter notes.  I, 12/08/2018, CMA, am acting as Insurance claims handler for Energy manager, DO.  I have reviewed the above documentation for accuracy and completeness, and I agree with the above. [employer], DO

## 2019-12-16 LAB — INSULIN, RANDOM: INSULIN: 18.7 u[IU]/mL (ref 2.6–24.9)

## 2019-12-16 LAB — LIPID PANEL
Chol/HDL Ratio: 3.6 ratio (ref 0.0–4.4)
Cholesterol, Total: 261 mg/dL — ABNORMAL HIGH (ref 100–199)
HDL: 73 mg/dL (ref >39)
LDL Chol Calc (NIH): 160 mg/dL — ABNORMAL HIGH (ref 0–99)
Triglycerides: 159 mg/dL — ABNORMAL HIGH (ref 0–149)
VLDL Cholesterol Cal: 28 mg/dL (ref 5–40)

## 2019-12-16 LAB — T3, FREE: T3, Free: 3.1 pg/mL (ref 2.0–4.4)

## 2019-12-16 LAB — COMPREHENSIVE METABOLIC PANEL
ALT: 25 IU/L (ref 0–32)
AST: 25 IU/L (ref 0–40)
Albumin/Globulin Ratio: 1.7 (ref 1.2–2.2)
Albumin: 4.5 g/dL (ref 3.8–4.9)
Alkaline Phosphatase: 108 IU/L (ref 48–121)
BUN/Creatinine Ratio: 16 (ref 9–23)
BUN: 12 mg/dL (ref 6–24)
Bilirubin Total: 0.4 mg/dL (ref 0.0–1.2)
CO2: 25 mmol/L (ref 20–29)
Calcium: 9.3 mg/dL (ref 8.7–10.2)
Chloride: 101 mmol/L (ref 96–106)
Creatinine, Ser: 0.77 mg/dL (ref 0.57–1.00)
GFR calc Af Amer: 102 mL/min/{1.73_m2} (ref >59)
GFR calc non Af Amer: 88 mL/min/{1.73_m2} (ref >59)
Globulin, Total: 2.6 g/dL (ref 1.5–4.5)
Glucose: 108 mg/dL — ABNORMAL HIGH (ref 65–99)
Potassium: 4.3 mmol/L (ref 3.5–5.2)
Sodium: 140 mmol/L (ref 134–144)
Total Protein: 7.1 g/dL (ref 6.0–8.5)

## 2019-12-16 LAB — CBC WITH DIFFERENTIAL/PLATELET
Basophils Absolute: 0.1 10*3/uL (ref 0.0–0.2)
Basos: 1 %
EOS (ABSOLUTE): 0.3 10*3/uL (ref 0.0–0.4)
Eos: 3 %
Hematocrit: 41.8 % (ref 34.0–46.6)
Hemoglobin: 14.5 g/dL (ref 11.1–15.9)
Immature Grans (Abs): 0.1 10*3/uL (ref 0.0–0.1)
Immature Granulocytes: 1 %
Lymphocytes Absolute: 1.6 10*3/uL (ref 0.7–3.1)
Lymphs: 17 %
MCH: 30.7 pg (ref 26.6–33.0)
MCHC: 34.7 g/dL (ref 31.5–35.7)
MCV: 88 fL (ref 79–97)
Monocytes Absolute: 0.8 10*3/uL (ref 0.1–0.9)
Monocytes: 8 %
Neutrophils Absolute: 6.6 10*3/uL (ref 1.4–7.0)
Neutrophils: 70 %
Platelets: 319 10*3/uL (ref 150–450)
RBC: 4.73 x10E6/uL (ref 3.77–5.28)
RDW: 12.5 % (ref 11.7–15.4)
WBC: 9.4 10*3/uL (ref 3.4–10.8)

## 2019-12-16 LAB — T4, FREE: Free T4: 1.06 ng/dL (ref 0.82–1.77)

## 2019-12-16 LAB — FOLATE: Folate: 7.5 ng/mL (ref >3.0)

## 2019-12-16 LAB — HEMOGLOBIN A1C
Est. average glucose Bld gHb Est-mCnc: 134 mg/dL
Hgb A1c MFr Bld: 6.3 % — ABNORMAL HIGH (ref 4.8–5.6)

## 2019-12-16 LAB — VITAMIN B12: Vitamin B-12: 735 pg/mL (ref 232–1245)

## 2019-12-16 LAB — MAGNESIUM: Magnesium: 2.2 mg/dL (ref 1.6–2.3)

## 2019-12-16 LAB — TSH: TSH: 1.75 u[IU]/mL (ref 0.450–4.500)

## 2019-12-16 LAB — VITAMIN D 25 HYDROXY (VIT D DEFICIENCY, FRACTURES): Vit D, 25-Hydroxy: 47.8 ng/mL (ref 30.0–100.0)
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
## 2019-12-29 ENCOUNTER — Other Ambulatory Visit: Payer: Self-pay

## 2019-12-29 ENCOUNTER — Ambulatory Visit (INDEPENDENT_AMBULATORY_CARE_PROVIDER_SITE_OTHER): Payer: BC Managed Care – PPO | Admitting: Family Medicine

## 2019-12-29 ENCOUNTER — Encounter (INDEPENDENT_AMBULATORY_CARE_PROVIDER_SITE_OTHER): Payer: Self-pay | Admitting: Family Medicine

## 2019-12-29 VITALS — BP 121/87 | HR 76 | Temp 98.4°F | Ht 62.0 in | Wt 211.0 lb

## 2019-12-29 DIAGNOSIS — E559 Vitamin D deficiency, unspecified: Secondary | ICD-10-CM | POA: Diagnosis not present

## 2019-12-29 DIAGNOSIS — Z6838 Body mass index (BMI) 38.0-38.9, adult: Secondary | ICD-10-CM

## 2019-12-29 DIAGNOSIS — K760 Fatty (change of) liver, not elsewhere classified: Secondary | ICD-10-CM

## 2019-12-29 DIAGNOSIS — E7849 Other hyperlipidemia: Secondary | ICD-10-CM

## 2019-12-29 DIAGNOSIS — Z9189 Other specified personal risk factors, not elsewhere classified: Secondary | ICD-10-CM

## 2019-12-29 DIAGNOSIS — R7303 Prediabetes: Secondary | ICD-10-CM

## 2019-12-29 NOTE — Patient Instructions (Signed)
The 10-year ASCVD risk score Denman George DC Montez Hageman., et al., 2013) is: 1.8%   Values used to calculate the score:     Age: 54 years     Sex: Female     Is Non-Hispanic African American: No     Diabetic: No     Tobacco smoker: No     Systolic Blood Pressure: 132 mmHg     Is BP treated: No     HDL Cholesterol: 73 mg/dL     Total Cholesterol: 261 mg/dL

## 2019-12-30 ENCOUNTER — Telehealth (INDEPENDENT_AMBULATORY_CARE_PROVIDER_SITE_OTHER): Payer: BC Managed Care – PPO | Admitting: Psychology

## 2019-12-30 ENCOUNTER — Encounter (INDEPENDENT_AMBULATORY_CARE_PROVIDER_SITE_OTHER): Payer: Self-pay | Admitting: Family Medicine

## 2019-12-30 DIAGNOSIS — F5089 Other specified eating disorder: Secondary | ICD-10-CM

## 2019-12-30 NOTE — Progress Notes (Addendum)
Chief Complaint:   OBESITY [name] is here to discuss her progress with her obesity treatment plan along with follow-up of her obesity related diagnoses. [name] is on the Category 2 Plan and states she is following her eating plan approximately 75% of the time. [name] states she is lifting weights for 45-60 minutes 5 times per week.  Today's visit was #: 2 Starting weight: 211 lbs Starting date: 12/15/2019 Today's weight: 211 lbs Today's date: 12/29/2019 Total lbs lost to date: 0 Total lbs lost since last in-office visit: 0  Interim History: [name] says that her hunger and cravings are well-controlled.  Overall, she says she likes the diet.  She has been going to the beach for long weekends most of the time for vacation.  Today is her first follow-up and she is here to review labs as well.  Subjective:   1. Prediabetes Discussed labs with patient today.   [name] has a diagnosis of prediabetes based on her elevated HgA1c and was informed this puts her at greater risk of developing diabetes. She continues to work on diet and exercise to decrease her risk of diabetes. She denies nausea or hypoglycemia.  [name] was diagnosed 3-4 years ago and was told her A1c was in the prediabetic range.  Lab Results  Component Value Date   HGBA1C 6.3 (H) 12/15/2019   Lab Results  Component Value Date   INSULIN 18.7 12/15/2019   2. Nonalcoholic hepatosteatosis Discussed labs with patient today. She denies abdominal pain or jaundice and has never been told of any liver problems in the past. She denies excessive alcohol intake.  LFTs are within normal limits.  Lab Results  Component Value Date   ALT 25 12/15/2019   AST 25 12/15/2019   ALKPHOS 108 12/15/2019   BILITOT 0.4 12/15/2019   3. Other hyperlipidemia Discussed labs with patient today.   [name] has hyperlipidemia and has been trying to improve her cholesterol levels with intensive lifestyle modification including a low saturated fat  diet, exercise and weight loss. She denies any chest pain, claudication or myalgias.  LDL and triglycerides are elevated and HDL is above 60.  Lab Results  Component Value Date   ALT 25 12/15/2019   AST 25 12/15/2019   ALKPHOS 108 12/15/2019   BILITOT 0.4 12/15/2019   Lab Results  Component Value Date   CHOL 261 (H) 12/15/2019   HDL 73 12/15/2019   LDLCALC 160 (H) 12/15/2019   TRIG 159 (H) 12/15/2019   CHOLHDL 3.6 12/15/2019   4. Vitamin D deficiency Discussed labs with patient today.  [name]'s Vitamin D level was 47.8 on 12/15/2019. She is currently taking OTC vitamin D 1000 IU each day. She denies nausea, vomiting or muscle weakness.  Level is almost at 50.  5. At risk for diabetes mellitus [name] is at higher than average risk for developing diabetes due to her obesity.   Assessment/Plan:   1. Prediabetes [name] will continue to work on weight loss, exercise, and decreasing simple carbohydrates to help decrease the risk of diabetes.  Recheck A1c in 3 months along with fasting insulin.  2. Nonalcoholic hepatosteatosis [name] was educated the importance of weight loss. [name] agreed to continue with her weight loss efforts with healthier diet and exercise as an essential part of her treatment plan.    3. Other hyperlipidemia Cardiovascular risk and specific lipid/LDL goals reviewed.  We discussed several lifestyle modifications today and [name] will continue to work on diet, exercise and weight loss  efforts. Orders and follow up as documented in patient record.   Counseling Intensive lifestyle modifications are the first line treatment for this issue. . Dietary changes: Increase soluble fiber. Decrease simple carbohydrates. . Exercise changes: Moderate to vigorous-intensity aerobic activity 150 minutes per week if tolerated. . Lipid-lowering medications: see documented in medical record.  4. Vitamin D deficiency Low Vitamin D level contributes to fatigue and are associated  with obesity, breast, and colon cancer. She agrees to continue to take OTC Vitamin D @1 ,000 IU daily and will follow-up for routine testing of Vitamin D, at least 2-3 times per year to avoid over-replacement.  Continue current treatment plan, prudent nutritional plan, weight loss.  5. At risk for diabetes mellitus [name] was given approximately 15 minutes of diabetes education and counseling today. We discussed intensive lifestyle modifications today with an emphasis on weight loss as well as increasing exercise and decreasing simple carbohydrates in her diet. We also reviewed medication options with an emphasis on risk versus benefit of those discussed.   Repetitive spaced learning was employed today to elicit superior memory formation and behavioral change.  6. Class 2 severe obesity with serious comorbidity and body mass index (BMI) of 38.0 to 38.9 in adult, unspecified obesity type [name]) [name] is currently in the action stage of change. As such, her goal is to continue with weight loss efforts. She has agreed to the Category 2 Plan.   Exercise goals: As is.  Behavioral modification strategies: increasing lean protein intake, decreasing simple carbohydrates, increasing water intake, decreasing eating out and planning for success.  [name] has agreed to follow-up with our clinic in 2 weeks. She was informed of the importance of frequent follow-up visits to maximize her success with intensive lifestyle modifications for her multiple health conditions.   Objective:   Blood pressure 121/87, pulse 76, temperature 98.4 F (36.9 C), temperature source Oral, height 5\' 2"  (1.575 m), weight 211 lb (95.7 kg), SpO2 97 %. Body mass index is 38.59 kg/m.  General: Cooperative, alert, well developed, in no acute distress. HEENT: Conjunctivae and lids unremarkable. Cardiovascular: Regular rhythm.  Lungs: Normal work of breathing. Neurologic: No focal deficits.   Lab Results  Component Value Date    CREATININE 0.77 12/15/2019   BUN 12 12/15/2019   NA 140 12/15/2019   K 4.3 12/15/2019   CL 101 12/15/2019   CO2 25 12/15/2019   Lab Results  Component Value Date   ALT 25 12/15/2019   AST 25 12/15/2019   ALKPHOS 108 12/15/2019   BILITOT 0.4 12/15/2019   Lab Results  Component Value Date   HGBA1C 6.3 (H) 12/15/2019   HGBA1C 6.2 (H) 12/06/2018   HGBA1C 6.0 07/30/2018   HGBA1C 5.7 (H) 03/07/2017   Lab Results  Component Value Date   INSULIN 18.7 12/15/2019   Lab Results  Component Value Date   TSH 1.750 12/15/2019   Lab Results  Component Value Date   CHOL 261 (H) 12/15/2019   HDL 73 12/15/2019   LDLCALC 160 (H) 12/15/2019   TRIG 159 (H) 12/15/2019   CHOLHDL 3.6 12/15/2019   Lab Results  Component Value Date   WBC 9.4 12/15/2019   HGB 14.5 12/15/2019   HCT 41.8 12/15/2019   MCV 88 12/15/2019   PLT 319 12/15/2019   Attestation Statements:   Reviewed by clinician on day of visit: allergies, medications, problem list, medical history, surgical history, family history, social history, and previous encounter notes.  I, 12/17/2019, CMA, am acting as  transcriptionist for [employer], DO.  I have reviewed the above documentation for accuracy and completeness, and I agree with the above. Thomasene Lot, DO

## 2019-12-31 NOTE — Progress Notes (Unsigned)
Office: [phone]  /  Fax: [phone]    Date: January 14, 2020   Appointment Start Time: *** Duration: *** minutes Provider: Lawerance Cruel, Psy.D. Type of Session: Individual Therapy  Location of Patient: {gbptloc:23249} Location of Provider: {Location of Service:22491} Type of Contact: Telepsychological Visit via MyChart Video Visit  Session Content: [name] is a 54 y.o. female presenting via MyChart Video Visit for a follow-up appointment to address the previously established treatment goal of increasing coping skills. Today's appointment was a telepsychological visit due to COVID-19. [name] provided verbal consent for today's telepsychological appointment and she is aware she is responsible for securing confidentiality on her end of the session. Prior to proceeding with today's appointment, [name]'s physical location at the time of this appointment was obtained as well a phone number she could be reached at in the event of technical difficulties. [name] and this provider participated in today's telepsychological service.   This provider conducted a brief check-in and verbally administered the PHQ-9 and GAD-7. *** [name] was receptive to today's appointment as evidenced by openness to sharing, responsiveness to feedback, and {gbreceptiveness:23401}.  Mental Status Examination:  Appearance: {Appearance:22431} Behavior: {Behavior:22445} Mood: {gbmood:21757} Affect: {Affect:22436} Speech: {Speech:22432} Eye Contact: {Eye Contact:22433} Psychomotor Activity: {Motor Activity:22434} Gait: {gbgait:23404} Thought Process: {thought process:22448}  Thought Content/Perception: {disturbances:22451} Orientation: {Orientation:22437} Memory/Concentration: {gbcognition:22449} Insight/Judgment: {Insight:22446}  Structured Assessments Results: The Patient Health Questionnaire-9 (PHQ-9) is a self-report measure that assesses symptoms and severity of depression over the course of the last two  weeks. [name] obtained a score of *** suggesting {GBPHQ9SEVERITY:21752}. [name] finds the endorsed symptoms to be {gbphq9difficulty:21754}. [0= Not at all; 1= Several days; 2= More than half the days; 3= Nearly every day] Little interest or pleasure in doing things ***  Feeling down, depressed, or hopeless ***  Trouble falling or staying asleep, or sleeping too much ***  Feeling tired or having little energy ***  Poor appetite or overeating ***  Feeling bad about yourself --- or that you are a failure or have let yourself or your family down ***  Trouble concentrating on things, such as reading the newspaper or watching television ***  Moving or speaking so slowly that other people could have noticed? Or the opposite --- being so fidgety or restless that you have been moving around a lot more than usual ***  Thoughts that you would be better off dead or hurting yourself in some way ***  PHQ-9 Score ***    The Generalized Anxiety Disorder-7 (GAD-7) is a brief self-report measure that assesses symptoms of anxiety over the course of the last two weeks. [name] obtained a score of *** suggesting {gbgad7severity:21753}. [name] finds the endorsed symptoms to be {gbphq9difficulty:21754}. [0= Not at all; 1= Several days; 2= Over half the days; 3= Nearly every day] Feeling nervous, anxious, on edge ***  Not being able to stop or control worrying ***  Worrying too much about different things ***  Trouble relaxing ***  Being so restless that it's hard to sit still ***  Becoming easily annoyed or irritable ***  Feeling afraid as if something awful might happen ***  GAD-7 Score ***   Interventions:  {Interventions for Progress Notes:23405}  DSM-5 Diagnosis(es): 307.59 (F50.8) Other Specified Feeding or Eating Disorder, Emotional Eating Behaviors  Treatment Goal & Progress: During the initial appointment with this provider, the following treatment goal was established: increase coping skills. [name]  has demonstrated progress in her goal as evidenced by {gbtxprogress:22839}. [name] also {gbtxprogress2:22951}.  Plan: The next appointment will  be scheduled in {gbweeks:21758}, which will be {gbtxmodality:23402}. The next session will focus on {Plan for Next Appointment:23400}.

## 2020-01-12 ENCOUNTER — Other Ambulatory Visit: Payer: Self-pay

## 2020-01-12 ENCOUNTER — Encounter (INDEPENDENT_AMBULATORY_CARE_PROVIDER_SITE_OTHER): Payer: Self-pay | Admitting: Family Medicine

## 2020-01-12 ENCOUNTER — Ambulatory Visit (INDEPENDENT_AMBULATORY_CARE_PROVIDER_SITE_OTHER): Payer: BC Managed Care – PPO | Admitting: Family Medicine

## 2020-01-12 VITALS — BP 136/81 | HR 81 | Temp 97.9°F | Ht 62.0 in | Wt 212.0 lb

## 2020-01-12 DIAGNOSIS — F419 Anxiety disorder, unspecified: Secondary | ICD-10-CM

## 2020-01-12 DIAGNOSIS — E559 Vitamin D deficiency, unspecified: Secondary | ICD-10-CM

## 2020-01-12 DIAGNOSIS — R7303 Prediabetes: Secondary | ICD-10-CM

## 2020-01-12 DIAGNOSIS — Z6838 Body mass index (BMI) 38.0-38.9, adult: Secondary | ICD-10-CM

## 2020-01-13 NOTE — Progress Notes (Addendum)
Chief Complaint:   OBESITY [name] is here to discuss her progress with her obesity treatment plan along with follow-up of her obesity related diagnoses. [name] is on the Category 2 Plan and states she is following her eating plan approximately 85-90% of the time. [name] states she is weight lifting for 45 minutes 5 times per week.  Today's visit was #: 3 Starting weight: 211 lbs Starting date: 12/15/2019 Today's weight: 212 lbs Today's date: 01/12/2020 Total lbs lost to date: 0 Total lbs lost since last in-office visit: 0  Interim History: [name] says she has been bloated and constipated very badly lately.  She has Senokot OTC at home, but has not used it yet.  She says she is drinking 100 ounces of water per day or more.  If she does not have a BM one day, she will take it the next.  "I am making bad choices and am more stressed than usual."  She is at the beach for 10 days and comes back here for 4 days.  She does this all summer long.  Subjective:   1. Prediabetes [name] has a diagnosis of prediabetes based on her elevated HgA1c and was informed this puts her at greater risk of developing diabetes. She continues to work on diet and exercise to decrease her risk of diabetes. She denies nausea or hypoglycemia.  No new symptoms.  Denies concerns or questions.  Lab Results  Component Value Date   HGBA1C 6.3 (H) 12/15/2019   Lab Results  Component Value Date   INSULIN 18.7 12/15/2019   2. Vitamin D deficiency [name]'s Vitamin D level was 47.8 on 12/15/2019. She is currently taking OTC vitamin D 1000 IU each day. She denies nausea, vomiting or muscle weakness.  Vitamin D level is well-controlled.  3. Anxiety [name] says she is still having "chest tightness" in moments of increased stress.  She does not have these symptoms when down at the beach relaxing and not working.  She only has them when at work/stressed.    Assessment/Plan:   1. Prediabetes [name] will continue to work  on weight loss, exercise, and decreasing simple carbohydrates to help decrease the risk of diabetes.   2. Vitamin D deficiency Low Vitamin D level contributes to fatigue and are associated with obesity, breast, and colon cancer. She agrees to continue to take prescription Vitamin D @50 ,000 IU every week and will follow-up for routine testing of Vitamin D, at least 2-3 times per year to avoid over-replacement.  3. Anxiety She will follow-up with her PCP in the near future regarding anxiousness/chronic chest discomfort with stress that occurs.  For now, I recommend that she go on two 5 minute walks per day.  I also recommend the Headspace or Calm app for 10-15 minutes of medication for work stress, etc. twice daily.  If symptoms ever have an exertional component or change in any way, she will contact her PCP or .  4. Class 2 severe obesity with serious comorbidity and body mass index (BMI) of 38.0 to 38.9 in adult, unspecified obesity type [name]) [name] is currently in the action stage of change. As such, her goal is to continue with weight loss efforts. She has agreed to the Category 2 Plan.   Exercise goals: As is.  Behavioral modification strategies: increasing lean protein intake, increasing water intake, no skipping meals, meal planning and cooking strategies and planning for success.  [name] has agreed to follow-up with our clinic in 2 weeks. She  was informed of the importance of frequent follow-up visits to maximize her success with intensive lifestyle modifications for her multiple health conditions.   Objective:   Blood pressure (!) 136/81, pulse 81, temperature 97.9 F (36.6 C), height 5\' 2"  (1.575 m), weight (!) 212 lb (96.2 kg), SpO2 97 %. Body mass index is 38.78 kg/m.  General: Cooperative, alert, well developed, in no acute distress. HEENT: Conjunctivae and lids unremarkable. Cardiovascular: Regular rhythm.  Lungs: Normal work of breathing. Neurologic: No focal deficits.    Lab Results  Component Value Date   CREATININE 0.77 12/15/2019   BUN 12 12/15/2019   NA 140 12/15/2019   K 4.3 12/15/2019   CL 101 12/15/2019   CO2 25 12/15/2019   Lab Results  Component Value Date   ALT 25 12/15/2019   AST 25 12/15/2019   ALKPHOS 108 12/15/2019   BILITOT 0.4 12/15/2019   Lab Results  Component Value Date   HGBA1C 6.3 (H) 12/15/2019   HGBA1C 6.2 (H) 12/06/2018   HGBA1C 6.0 07/30/2018   HGBA1C 5.7 (H) 03/07/2017   Lab Results  Component Value Date   INSULIN 18.7 12/15/2019   Lab Results  Component Value Date   TSH 1.750 12/15/2019   Lab Results  Component Value Date   CHOL 261 (H) 12/15/2019   HDL 73 12/15/2019   LDLCALC 160 (H) 12/15/2019   TRIG 159 (H) 12/15/2019   CHOLHDL 3.6 12/15/2019   Lab Results  Component Value Date   WBC 9.4 12/15/2019   HGB 14.5 12/15/2019   HCT 41.8 12/15/2019   MCV 88 12/15/2019   PLT 319 12/15/2019   Attestation Statements:   Reviewed by clinician on day of visit: allergies, medications, problem list, medical history, surgical history, family history, social history, and previous encounter notes.  Time spent on visit including pre-visit chart review and post-visit care and charting was 30 minutes.   I, 12/17/2019, CMA, am acting as Insurance claims handler for [employer], DO.  I have reviewed the above documentation for accuracy and completeness, and I agree with the above. [employer], DO

## 2020-01-14 ENCOUNTER — Telehealth (INDEPENDENT_AMBULATORY_CARE_PROVIDER_SITE_OTHER): Payer: Self-pay | Admitting: Psychology

## 2020-01-28 ENCOUNTER — Ambulatory Visit (INDEPENDENT_AMBULATORY_CARE_PROVIDER_SITE_OTHER): Payer: BC Managed Care – PPO | Admitting: Family Medicine

## 2020-02-10 ENCOUNTER — Ambulatory Visit: Payer: BC Managed Care – PPO | Admitting: Primary Care

## 2020-03-17 ENCOUNTER — Encounter: Payer: Self-pay | Admitting: Obstetrics and Gynecology

## 2020-03-24 ENCOUNTER — Encounter: Payer: Self-pay | Admitting: Obstetrics and Gynecology

## 2020-03-24 ENCOUNTER — Other Ambulatory Visit: Payer: Self-pay

## 2020-03-24 ENCOUNTER — Ambulatory Visit: Payer: BC Managed Care – PPO | Admitting: Obstetrics and Gynecology

## 2020-03-24 VITALS — BP 138/70 | HR 88 | Resp 16 | Ht 61.5 in | Wt 218.0 lb

## 2020-03-24 DIAGNOSIS — Z01419 Encounter for gynecological examination (general) (routine) without abnormal findings: Secondary | ICD-10-CM

## 2020-03-24 MED ORDER — ESTRADIOL 0.1 MG/GM VA CREA: TOPICAL_CREAM | VAGINAL | 2 refills | Status: DC

## 2020-03-24 MED ORDER — CLOBETASOL PROPIONATE 0.05 % EX OINT
1.0000 | TOPICAL_OINTMENT | Freq: Two times a day (BID) | CUTANEOUS | 1 refills | Status: DC
Start: 2020-03-24 — End: 2021-04-11

## 2020-03-24 NOTE — Patient Instructions (Signed)

## 2020-03-24 NOTE — Progress Notes (Signed)
54 y.o. G13P2002 Married Caucasian female here for annual exam.    Having hot flashes.  Not sleeping.  She used estrogen patches and had weight gain, 25 pounds in 3 - 4 weeks.  She went to weight management group.   Dealing with stress.  She will see her PCP tomorrow.   Received her Covid vaccine.   PCP:  Vernona Rieger, NP  No LMP recorded. Patient has had an ablation.           Sexually active: Yes.    The current method of family planning is vasectomy/Ablation.    Exercising: Yes.    weights Smoker:  no  Health Maintenance: Pap: 11-28-17 Neg:Neg HR HPV, 11-26-14 Neg History of abnormal Pap:  no MMG:  03-04-20 3D/Neg/density A/BiRads1 Colonoscopy: 11/29/2016 Dr Loreta Ave follow up 10 years BMD:   n/a  Result  n/a TDaP:  03-08-18 Gardasil:   no HIV:Neg in pregnancy Hep C:03-07-17 Neg Screening Labs:  PCP.   reports that she has never smoked. She has never used smokeless tobacco. She reports that she does not drink alcohol and does not use drugs.  Past Medical History:  Diagnosis Date  . Anxiety   . Back pain   . Constipation   . Elevated hemoglobin A1c 2020  . Fatty liver   . Hypercholesteremia   . Joint pain   . Kidney problem   . Kidney stones   . 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 Migraines   . Nausea in adult   . Prediabetes   . Shortness of breath   . Stress   . Subclinical hypothyroidism 4/11    Past Surgical History:  Procedure Laterality Date  . LITHOTRIPSY    . NOVASURE ABLATION  12-18-06  . ROTATOR CUFF REPAIR Right 2009    Current Outpatient Medications  Medication Sig Dispense Refill  . cholecalciferol (VITAMIN D3) 25 MCG (1000 UT) tablet Take 1,000 Units by mouth daily.    . magnesium 30 MG tablet Take 30 mg by mouth 2 (two) times daily.    . Omega-3 Fatty Acids (FISH OIL PO) Take by mouth daily.    . Rhubarb (ESTROVEN COMPLETE PO) Take 1 tablet by mouth daily.     No current facility-administered medications for this visit.    Family History   Problem Relation Age of Onset  . Diabetes Mother   . Hyperlipidemia Mother   . Thyroid disease Mother   . Anxiety disorder Mother   . Obesity Mother   . Cancer Paternal Grandmother        colon    Review of Systems  All other systems reviewed and are negative.   Exam:   BP 138/70 (Cuff Size: Large)   Pulse 88   Resp 16   Ht 5' 1.5" (1.562 m)   Wt 218 lb (98.9 kg)   BMI 40.52 kg/m     General appearance: alert, cooperative and appears stated age Head: normocephalic, without obvious abnormality, atraumatic Neck: no adenopathy, supple, symmetrical, trachea midline and thyroid normal to inspection and palpation Lungs: clear to auscultation bilaterally Breasts: normal appearance, no masses or tenderness, No nipple retraction or dimpling, No nipple discharge or bleeding, No axillary adenopathy Heart: regular rate and rhythm Abdomen: soft, non-tender; no masses, no organomegaly Extremities: extremities normal, atraumatic, no cyanosis or edema Skin: skin color, texture, turgor normal. No rashes or lesions Lymph nodes: cervical, supraclavicular, and axillary nodes normal. Neurologic: grossly normal  Pelvic: External genitalia:  Ecchymoses and fissures  of the left labia minora.               No abnormal inguinal nodes palpated.              Urethra:  normal appearing urethra with no masses, tenderness or lesions              Bartholins and Skenes: normal                 Vagina: normal appearing vagina with normal color and discharge, moderate pigmented area 5 mm posterior introitus.               Cervix: no lesions              Pap taken: No. Bimanual Exam:  Uterus:  normal size, contour, position, consistency, mobility, non-tender              Adnexa: no mass, fullness, tenderness              Rectal exam: Yes.  .  Confirms.              Anus:  normal sphincter tone, no lesions  Chaperone was present for exam.  Assessment:   Well woman visit with normal exam. Status post  endometrial ablation.  Postmenopausal female. Hx lichen planus of the vulva.  Elevated A1C. Stress.   Menopausal symptoms.   Plan: Mammogram screening discussed. Self breast awareness reviewed. Pap and HR HPV as above. Guidelines for Calcium, Vitamin D, regular exercise program including cardiovascular and weight bearing exercise. Rx for Estrace cream.  I discussed effect on breast cancer.  Rx for clobetasol ointment.  We discussed Paxil, Effexor, or Gabapentin for hot flashes.   We will see her PCP to discuss stress and may request Paxil or Effexor if desired to also treat vasomotor symptoms. Follow up annually and prn.   After visit summary provided.

## 2020-04-23 ENCOUNTER — Other Ambulatory Visit: Payer: Self-pay | Admitting: Internal Medicine

## 2020-04-23 DIAGNOSIS — R911 Solitary pulmonary nodule: Secondary | ICD-10-CM

## 2020-04-30 ENCOUNTER — Telehealth: Payer: Self-pay

## 2020-04-30 NOTE — Telephone Encounter (Signed)
NOTES ON FILE FROM [facility] [phone] SENT REFERRAL TO SCHEDULING 

## 2020-05-11 ENCOUNTER — Other Ambulatory Visit: Payer: BC Managed Care – PPO

## 2020-05-16 NOTE — Progress Notes (Signed)
Cardiology Office Note:   Date:  05/18/2020  NAME:  [name]    MRN: [id number] DOB:  [birth year]   PCP:  Pleas Koch, NP  Cardiologist:  No primary care provider on file.   Referring MD: Deland Pretty, MD   Chief Complaint  Patient presents with  . Chest Pain   History of Present Illness:   [name] is a 54 y.o. female with a hx of obesity, pre-DM who is being seen today for the evaluation of chest pain at the request of Deland Pretty, MD.  She reports for the last 2 years she has had episodes of exertional shortness of breath and chest pain.  She had an echocardiogram in 2020 and was largely unremarkable.  She underwent coronary calcium scoring which showed a coronary calcium score of 0.  Since that time she reports any incline or anything with heavy exertion gets her profoundly short of breath and she has tightness in her chest.  Symptoms resolved with resolution of activity.  She reports she does do some light activities such as walking on flat surfaces such as treadmills.  She can do this without any limitations.  Any inclines cause her symptoms to return.  She reports she is frustrated.  She is overweight with a BMI of 39.  She apparently has mild sleep apnea.  She opted not to do the machine.  She reports she did not like it.  She does have exceedingly high HDL cholesterol 73.  Her LDL cholesterol is high though.  She is not on statin medication.  Blood pressure 110/80.  She does have a family history of heart disease in her family.  She is a never smoker.  She does not drink alcohol or use drugs.  She does work as a Engineer, maintenance (IT) and reports work is stressful.  She apparently was started on Effexor or for some anxiety.  She also takes Klonopin infrequently.  She reports she does not feel anxious or depressed.  She feels she is cannot catch her breath and has chest tightness.  Her EKG today is normal with nonspecific ST-T changes likely related to obesity.  Her symptoms occur daily.   She also gets episodes of heart racing.  When she does exert herself her heart beats fast.  This bothers her.  This occurs daily as well.   Her son was seen by me several months ago for thoracic outlet syndrome.  He still struggling with his issues but doing okay.  He is seeing a pain specialist at [facility].  CT Cardiac Calcium score 12/17/2018 CAC=0  T chol 261, HDL 73, LDL 160, TG 159, A1c 6.3, TSH 1.75  Past Medical History: Past Medical History:  Diagnosis Date  . Anxiety   . Back pain   . Constipation   . Elevated hemoglobin A1c 2020  . Fatty liver   . Hypercholesteremia   . Joint pain   . Kidney problem   . Kidney stones   . 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 Migraines   . Nausea in adult   . Prediabetes   . Shortness of breath   . Stress   . Subclinical hypothyroidism 4/11    Past Surgical History: Past Surgical History:  Procedure Laterality Date  . LITHOTRIPSY    . NOVASURE ABLATION  12-18-06  . ROTATOR CUFF REPAIR Right 2009    Current Medications: Current Meds  Medication Sig  . cholecalciferol (VITAMIN D3) 25 MCG (1000 UT) tablet  Take 1,000 Units by mouth daily.  . clobetasol ointment (TEMOVATE) 7.03 % Apply 1 application topically 2 (two) times daily. Use for 2 weeks at a time for a flare.  You may use the cream twice a week at bedtime for maintenance dosing.  . clonazePAM (KLONOPIN) 0.5 MG tablet Take 0.5 mg by mouth 2 (two) times daily as needed.  Marland Kitchen estradiol (ESTRACE) 0.1 MG/GM vaginal cream Use 1/2 g vaginally every night for the first 2 weeks, then use 1/2 g vaginally two or three times per week as needed to maintain symptom relief.  . magnesium 30 MG tablet Take 30 mg by mouth 2 (two) times daily.  . Omega-3 Fatty Acids (FISH OIL PO) Take by mouth daily.  Marland Kitchen omeprazole (PRILOSEC) 20 MG capsule Take 20 mg by mouth daily.  . Rhubarb (ESTROVEN COMPLETE PO) Take 1 tablet by mouth daily.  Marland Kitchen venlafaxine XR (EFFEXOR-XR) 37.5 MG 24 hr capsule Take 37.5 mg by  mouth daily.     Allergies:    Zanaflex [tizanidine hcl] and Biaxin [clarithromycin]   Social History: Social History   Socioeconomic History  . Marital status: Married    Spouse name: Nicole Kindred  . Number of children: 2  . Years of education: Not on file  . Highest education level: Not on file  Occupational History  . Occupation: Nature conservation officer  Tobacco Use  . Smoking status: Never Smoker  . Smokeless tobacco: Never Used  Vaping Use  . Vaping Use: Never used  Substance and Sexual Activity  . Alcohol use: No    Alcohol/week: 0.0 standard drinks  . Drug use: No  . Sexual activity: Yes    Partners: Male    Birth control/protection: Surgical    Comment: Ablation, Vasectomy  Other Topics Concern  . Not on file  Social History Narrative  . Not on file   Social Determinants of Health   Financial Resource Strain:   . Difficulty of Paying Living Expenses: Not on file  Food Insecurity:   . Worried About Charity fundraiser in the Last Year: Not on file  . Ran Out of Food in the Last Year: Not on file  Transportation Needs:   . Lack of Transportation (Medical): Not on file  . Lack of Transportation (Non-Medical): Not on file  Physical Activity:   . Days of Exercise per Week: Not on file  . Minutes of Exercise per Session: Not on file  Stress:   . Feeling of Stress : Not on file  Social Connections:   . Frequency of Communication with Friends and Family: Not on file  . Frequency of Social Gatherings with Friends and Family: Not on file  . Attends Religious Services: Not on file  . Active Member of Clubs or Organizations: Not on file  . Attends Archivist Meetings: Not on file  . Marital Status: Not on file     Family History: The patient's family history includes Anxiety disorder in her mother; Aortic stenosis in her mother; Cancer in her paternal grandmother; Diabetes in her mother; Hyperlipidemia in her mother; Obesity in her mother;  Thyroid disease in her mother.  ROS:   All other ROS reviewed and negative. Pertinent positives noted in the HPI.     EKGs/Labs/Other Studies Reviewed:   The following studies were personally reviewed by me today:  EKG:  EKG is ordered today.  The ekg ordered today demonstrates normal sinus rhythm, heart rate 83 nonspecific ST-T changes, no evidence  of prior infarction, and was personally reviewed by me.   TTE 12/17/2018 1. The left ventricle has normal systolic function with an ejection  fraction of 60-65%. The cavity size was normal. There is mild asymmetric  left ventricular hypertrophy. Left ventricular diastolic Doppler  parameters are indeterminate. No evidence of  left ventricular regional wall motion abnormalities.  2. The right ventricle has normal systolic function. The cavity was  normal. There is no increase in right ventricular wall thickness.  3. The aortic root is normal in size and structure.   CT Cardiac Calcium score 12/17/2018 CAC=0  Recent Labs: 12/15/2019: ALT 25; BUN 12; Creatinine, Ser 0.77; Hemoglobin 14.5; Magnesium 2.2; Platelets 319; Potassium 4.3; Sodium 140; TSH 1.750   Recent Lipid Panel    Component Value Date/Time   CHOL 261 (H) 12/15/2019 1605   TRIG 159 (H) 12/15/2019 1605   HDL 73 12/15/2019 1605   CHOLHDL 3.6 12/15/2019 1605   LDLCALC 160 (H) 12/15/2019 1605    Physical Exam:   VS:  BP 110/80   Pulse 80   Ht 5' 2"  (1.575 m)   Wt 217 lb (98.4 kg)   SpO2 99%   BMI 39.69 kg/m    Wt Readings from Last 3 Encounters:  05/18/20 217 lb (98.4 kg)  03/24/20 218 lb (98.9 kg)  01/12/20 (!) 212 lb (96.2 kg)    General: Well nourished, well developed, in no acute distress Heart: Atraumatic, normal size  Eyes: PEERLA, EOMI  Neck: Supple, no JVD Endocrine: No thryomegaly Cardiac: Normal S1, S2; RRR; no murmurs, rubs, or gallops Lungs: Clear to auscultation bilaterally, no wheezing, rhonchi or rales  Abd: Soft, nontender, no hepatomegaly   Ext: No edema, pulses 2+ Musculoskeletal: No deformities, BUE and BLE strength normal and equal Skin: Warm and dry, no rashes   Neuro: Alert and oriented to person, place, time, and situation, CNII-XII grossly intact, no focal deficits  Psych: Normal mood and affect   ASSESSMENT:   MALEAH RABAGO is a 54 y.o. female who presents for the following: 1. Chest pain, unspecified type   2. SOB (shortness of breath) on exertion   3. Obesity (BMI 30-39.9)   4. Palpitations   5. Precordial pain     PLAN:   1. Chest pain, unspecified type 2. SOB (shortness of breath) on exertion 3. Obesity (BMI 30-39.9) -Exertional shortness of breath and chest tightness.  She has no evidence of heart failure on exam.  EKG shows no acute ischemic changes or evidence of prior infarction.  She has nonspecific ST-T changes related to obesity.  Recent thyroid studies are normal.  She does have stress but this is not worse over recent months.  She did have a CT coronary calcium score done last year which was 0.  She also had an echocardiogram which was normal.  Given that her symptoms continue I recommended coronary CTA to exclude obstructive CAD.  Soft plaque can be missed on coronary calcium score only may be dealing with underlying obstructive CAD.  Her main risk factor is obesity and elevated LDL.  HDL is protective low. -Symptoms could also be sleep apnea related.  She is unable to tolerate the machine.  I have asked her to try again.  4. Palpitations -She also reports exertional palpitations.  Heart is racing when she tries to do anything.  I recommended a 3-day Zio patch to exclude arrhythmia.  If she has any arrhythmias found she will likely need to have her sleep apnea  reevaluated.  Disposition: Return in about 3 months (around 08/16/2020).  Medication Adjustments/Labs and Tests Ordered: Current medicines are reviewed at length with the patient today.  Concerns regarding medicines are outlined above.  Orders  Placed This Encounter  Procedures  . CT CORONARY MORPH W/CTA COR W/SCORE W/CA W/CM &/OR WO/CM  . CT CORONARY FRACTIONAL FLOW RESERVE DATA PREP  . CT CORONARY FRACTIONAL FLOW RESERVE FLUID ANALYSIS  . Basic metabolic panel  . LONG TERM MONITOR (3-14 DAYS)  . EKG 12-Lead   Meds ordered this encounter  Medications  . metoprolol tartrate (LOPRESSOR) 50 MG tablet    Sig: Take 50 mg 2 hours before Coronary CT    Dispense:  1 tablet    Refill:  0    Patient Instructions  Medication Instructions:  Continue same medications *If you need a refill on your cardiac medications before your next appointment, please call your pharmacy*   Lab Work: Bmet today   Testing/Procedures: Coronary CT   Will be scheduled after approved by insurance   Follow instructions below  3 day Zio monitor  Follow-Up: At [facility], you and your health needs are our priority.  As part of our continuing mission to provide you with exceptional heart care, we have created designated Provider Care Teams.  These Care Teams include your primary Cardiologist (physician) and Advanced Practice Providers (APPs -  Physician Assistants and Nurse Practitioners) who all work together to provide you with the care you need, when you need it.  We recommend signing up for the patient portal called "MyChart".  Sign up information is provided on this After Visit Summary.  MyChart is used to connect with patients for Virtual Visits (Telemedicine).  Patients are able to view lab/test results, encounter notes, upcoming appointments, etc.  Non-urgent messages can be sent to your provider as well.   To learn more about what you can do with MyChart, go to [email or web address].    Your next appointment:  3 months   Tuesday 07/20/20 at 2:00 pm   The format for your next appointment: Office    Provider:  Dr.O'Neal   Your cardiac CT will be scheduled at one of the below locations:   [facility] [address] [place], [place] [address] [phone]  [place] [address] [place], [place] [address] [phone]  If scheduled at [facility], please arrive at the [facility] main entrance of [facility] 30 minutes prior to test start time. Proceed to the [facility] Radiology Department (first floor) to check-in and test prep.  If scheduled at [facility], please arrive 15 mins early for check-in and test prep.  Please follow these instructions carefully (unless otherwise directed):    On the Night Before the Test: . Be sure to Drink plenty of water. . Do not consume any caffeinated/decaffeinated beverages or chocolate 12 hours prior to your test. . Do not take any antihistamines 12 hours prior to your test.   On the Day of the Test: . Drink plenty of water. Do not drink any water within one hour of the test. . Do not eat any food 4 hours prior to the test. . You may take your regular medications prior to the test.  . Take metoprolol 50 mg two hours prior to test. . FEMALES- please wear underwire-free bra if available        After the Test: . Drink plenty of  water. . After receiving IV contrast, you may experience a mild flushed feeling. This is normal. . On occasion, you may experience a mild rash up to 24 hours after the test. This is not dangerous. If this occurs, you can take Benadryl 25 mg and increase your fluid intake. . If you experience trouble breathing, this can be serious. If it is severe call 911 IMMEDIATELY. If it is mild, please call our office.    Once we have confirmed authorization from your insurance company, we will call you to set up a date and time for your test. Based on how quickly your insurance processes prior authorizations requests, please allow up to 4 weeks to be contacted for scheduling your Cardiac CT appointment. Be advised that routine Cardiac CT  appointments could be scheduled as many as 8 weeks after your provider has ordered it.  For non-scheduling related questions, please contact the cardiac imaging nurse navigator should you have any questions/concerns: Marchia Bond, Cardiac Imaging Nurse Navigator Burley Saver, Interim Cardiac Imaging Nurse [place] [facility] Direct Office Dial: [phone]   For scheduling needs, including cancellations and rescheduling, please call [place], [phone].       Signed, Addison Naegeli. Audie Box, [place]  [address] [place], [place] [address] [phone]  05/18/2020 3:49 PM

## 2020-05-18 ENCOUNTER — Other Ambulatory Visit: Payer: Self-pay

## 2020-05-18 ENCOUNTER — Ambulatory Visit (INDEPENDENT_AMBULATORY_CARE_PROVIDER_SITE_OTHER): Payer: BC Managed Care – PPO | Admitting: Cardiovascular Disease

## 2020-05-18 ENCOUNTER — Encounter: Payer: Self-pay | Admitting: Cardiovascular Disease

## 2020-05-18 VITALS — BP 110/80 | HR 80 | Ht 62.0 in | Wt 217.0 lb

## 2020-05-18 DIAGNOSIS — R0602 Shortness of breath: Secondary | ICD-10-CM | POA: Diagnosis not present

## 2020-05-18 DIAGNOSIS — E669 Obesity, unspecified: Secondary | ICD-10-CM | POA: Diagnosis not present

## 2020-05-18 DIAGNOSIS — R002 Palpitations: Secondary | ICD-10-CM | POA: Diagnosis not present

## 2020-05-18 DIAGNOSIS — R079 Chest pain, unspecified: Secondary | ICD-10-CM | POA: Diagnosis not present

## 2020-05-18 DIAGNOSIS — R072 Precordial pain: Secondary | ICD-10-CM

## 2020-05-18 MED ORDER — METOPROLOL TARTRATE 50 MG PO TABS: ORAL_TABLET | ORAL | 0 refills | Status: DC

## 2020-05-18 NOTE — Patient Instructions (Addendum)
Medication Instructions:  Continue same medications *If you need a refill on your cardiac medications before your next appointment, please call your pharmacy*   Lab Work: Bmet today   Testing/Procedures: Coronary CT   Will be scheduled after approved by insurance   Follow instructions below  3 day Zio monitor  Follow-Up: At [facility], you and your health needs are our priority.  As part of our continuing mission to provide you with exceptional heart care, we have created designated Provider Care Teams.  These Care Teams include your primary Cardiologist (physician) and Advanced Practice Providers (APPs -  Physician Assistants and Nurse Practitioners) who all work together to provide you with the care you need, when you need it.  We recommend signing up for the patient portal called "MyChart".  Sign up information is provided on this After Visit Summary.  MyChart is used to connect with patients for Virtual Visits (Telemedicine).  Patients are able to view lab/test results, encounter notes, upcoming appointments, etc.  Non-urgent messages can be sent to your provider as well.   To learn more about what you can do with MyChart, go to [email or web address].    Your next appointment:  3 months   Tuesday 07/20/20 at 2:00 pm   The format for your next appointment: Office    Provider:  Dr.O'Neal   Your cardiac CT will be scheduled at one of the below locations:   [facility] [address] [place], [place] [address] [phone]  [place] [address] [place], [place] [address] [phone]  If scheduled at [facility], please arrive at the [facility] main entrance of [facility] 30 minutes prior to test start time. Proceed to the [facility] Radiology Department (first floor) to check-in and test prep.  If scheduled at [facility], please arrive 15 mins early for  check-in and test prep.  Please follow these instructions carefully (unless otherwise directed):    On the Night Before the Test: . Be sure to Drink plenty of water. . Do not consume any caffeinated/decaffeinated beverages or chocolate 12 hours prior to your test. . Do not take any antihistamines 12 hours prior to your test.   On the Day of the Test: . Drink plenty of water. Do not drink any water within one hour of the test. . Do not eat any food 4 hours prior to the test. . You may take your regular medications prior to the test.  . Take metoprolol 50 mg two hours prior to test. . FEMALES- please wear underwire-free bra if available        After the Test: . Drink plenty of water. . After receiving IV contrast, you may experience a mild flushed feeling. This is normal. . On occasion, you may experience a mild rash up to 24 hours after the test. This is not dangerous. If this occurs, you can take Benadryl 25 mg and increase your fluid intake. . If you experience trouble breathing, this can be serious. If it is severe call 911 IMMEDIATELY. If it is mild, please call our office.    Once we have confirmed authorization from your insurance company, we will call you to set up a date and time for your test. Based on how quickly your insurance processes prior authorizations requests, please allow up to 4 weeks to be contacted for scheduling your Cardiac CT appointment. Be advised that routine Cardiac CT appointments could  be scheduled as many as 8 weeks after your provider has ordered it.  For non-scheduling related questions, please contact the cardiac imaging nurse navigator should you have any questions/concerns: Marchia Bond, Cardiac Imaging Nurse Navigator Burley Saver, Interim Cardiac Imaging Nurse [place] [facility] Services Direct Office Dial: [phone]   For scheduling needs, including cancellations and rescheduling, please call [place],  [phone].

## 2020-05-19 LAB — BASIC METABOLIC PANEL
BUN/Creatinine Ratio: 15 (ref 9–23)
BUN: 11 mg/dL (ref 6–24)
CO2: 26 mmol/L (ref 20–29)
Calcium: 9.2 mg/dL (ref 8.7–10.2)
Chloride: 101 mmol/L (ref 96–106)
Creatinine, Ser: 0.71 mg/dL (ref 0.57–1.00)
GFR calc Af Amer: 112 mL/min/{1.73_m2} (ref >59)
GFR calc non Af Amer: 97 mL/min/{1.73_m2} (ref >59)
Glucose: 84 mg/dL (ref 65–99)
Potassium: 4.5 mmol/L (ref 3.5–5.2)
Sodium: 141 mmol/L (ref 134–144)

## 2020-05-21 ENCOUNTER — Ambulatory Visit (INDEPENDENT_AMBULATORY_CARE_PROVIDER_SITE_OTHER): Payer: BC Managed Care – PPO

## 2020-05-21 DIAGNOSIS — E669 Obesity, unspecified: Secondary | ICD-10-CM

## 2020-05-21 DIAGNOSIS — R072 Precordial pain: Secondary | ICD-10-CM | POA: Diagnosis not present

## 2020-05-21 DIAGNOSIS — R079 Chest pain, unspecified: Secondary | ICD-10-CM

## 2020-05-21 DIAGNOSIS — R002 Palpitations: Secondary | ICD-10-CM

## 2020-05-21 DIAGNOSIS — R0602 Shortness of breath: Secondary | ICD-10-CM

## 2020-06-07 ENCOUNTER — Telehealth (HOSPITAL_COMMUNITY): Payer: Self-pay | Admitting: Emergency Medicine

## 2020-06-07 NOTE — Telephone Encounter (Signed)
Pt returning phone call regarding upcoming cardiac imaging study; pt verbalizes understanding of appt date/time, parking situation and where to check in, pre-test NPO status and medications ordered, and verified current allergies; name and call back number provided for further questions should they arise Rockwell Alexandria RN Navigator Cardiac Imaging [facility] [phone] office [phone] cell  Pt states shes a difficult IV start [place] Dec

## 2020-06-07 NOTE — Telephone Encounter (Signed)
Attempted to call patient regarding upcoming cardiac CT appointment. °Left message on voicemail with name and callback number °Daveena Elmore RN Navigator Cardiac Imaging °[facility] °[phone] Office °[phone] Cell ° °

## 2020-06-08 ENCOUNTER — Encounter (HOSPITAL_COMMUNITY): Payer: Self-pay

## 2020-06-08 ENCOUNTER — Other Ambulatory Visit: Payer: Self-pay

## 2020-06-08 ENCOUNTER — Ambulatory Visit (HOSPITAL_COMMUNITY)
Admission: RE | Admit: 2020-06-08 | Discharge: 2020-06-08 | Disposition: A | Discharge status: Home / Self Care | Payer: BC Managed Care – PPO | Source: Ambulatory Visit | Attending: Cardiovascular Disease | Admitting: Cardiovascular Disease

## 2020-06-08 DIAGNOSIS — R072 Precordial pain: Secondary | ICD-10-CM

## 2020-06-08 MED ORDER — METOPROLOL TARTRATE 5 MG/5ML IV SOLN
INTRAVENOUS | Status: AC
  Administered 2020-06-08: 15:00:00 5 mg via INTRAVENOUS
  Filled 2020-06-08: qty 5

## 2020-06-08 MED ORDER — IOHEXOL 350 MG/ML SOLN
80.0000 mL | Freq: Once | INTRAVENOUS | Status: AC | PRN
  Administered 2020-06-08: 15:00:00 80 mL via INTRAVENOUS

## 2020-06-08 MED ORDER — NITROGLYCERIN 0.4 MG SL SUBL
SUBLINGUAL_TABLET | SUBLINGUAL | Status: AC
  Filled 2020-06-08: qty 1

## 2020-06-08 MED ORDER — METOPROLOL TARTRATE 5 MG/5ML IV SOLN: 5.0000 mg | INTRAVENOUS | Status: DC | PRN

## 2020-06-08 MED ORDER — NITROGLYCERIN 0.4 MG SL SUBL
SUBLINGUAL_TABLET | SUBLINGUAL | Status: AC
  Administered 2020-06-08: 15:00:00 0.8 mg
  Filled 2020-06-08: qty 1

## 2020-06-08 MED ORDER — METOPROLOL TARTRATE 5 MG/5ML IV SOLN
INTRAVENOUS | Status: AC
  Administered 2020-06-08: 14:00:00 5 mg via INTRAVENOUS
  Filled 2020-06-08: qty 15

## 2020-07-19 NOTE — Progress Notes (Unsigned)
Cardiology Office Note:   Date:  07/20/2020  NAME:  [name]    MRN: [id number] DOB:  1965-09-18   PCP:  Doreene Nest, NP  Cardiologist:  No primary care provider on file.   Referring MD: Doreene Nest, NP   Chief Complaint  Patient presents with  . Follow-up    3 months.   History of Present Illness:   [name] is a 55 y.o. female with a hx of obesity who presents for follow-up of SOB and palpitations. CCTA normal. Echo normal last year. Normal BNP. Normal monitor.  She reports she is still getting short of breath with exertion.  Heart can race as well.  Symptoms are slightly better.  We did discuss that her heart testing was normal.  I think it is safe for her to resume any exercise she wishes.  She should also work on dieting.  She has a plan to do this.  Cardiovascular exam remains normal.  EKG was normal last visit.  Overall doing well.  Problem List 1. Obesity -BMI 39 2. HLD -T chol 261, HDL 73, LDL 160, TG 159  Past Medical History: Past Medical History:  Diagnosis Date  . Anxiety   . Back pain   . Constipation   . Elevated hemoglobin A1c 2020  . Fatty liver   . Hypercholesteremia   . Joint pain   . Kidney problem   . Kidney stones   . 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 Migraines   . Nausea in adult   . Prediabetes   . Shortness of breath   . Stress   . Subclinical hypothyroidism 4/11    Past Surgical History: Past Surgical History:  Procedure Laterality Date  . LITHOTRIPSY    . NOVASURE ABLATION  12-18-06  . ROTATOR CUFF REPAIR Right 2009    Current Medications: Current Meds  Medication Sig  . cholecalciferol (VITAMIN D3) 25 MCG (1000 UT) tablet Take 1,000 Units by mouth daily.  . clobetasol ointment (TEMOVATE) 0.05 % Apply 1 application topically 2 (two) times daily. Use for 2 weeks at a time for a flare.  You may use the cream twice a week at bedtime for maintenance dosing.  . clonazePAM (KLONOPIN) 0.5 MG tablet Take 0.5 mg by mouth  2 (two) times daily as needed.  Marland Kitchen estradiol (ESTRACE) 0.1 MG/GM vaginal cream Use 1/2 g vaginally every night for the first 2 weeks, then use 1/2 g vaginally two or three times per week as needed to maintain symptom relief.  . magnesium 30 MG tablet Take 30 mg by mouth 2 (two) times daily.  . Omega-3 Fatty Acids (FISH OIL PO) Take by mouth daily.  Marland Kitchen omeprazole (PRILOSEC) 20 MG capsule Take 20 mg by mouth daily.  . Rhubarb (ESTROVEN COMPLETE PO) Take 1 tablet by mouth daily.  Marland Kitchen venlafaxine XR (EFFEXOR-XR) 37.5 MG 24 hr capsule Take 37.5 mg by mouth daily.     Allergies:    Zanaflex [tizanidine hcl] and Biaxin [clarithromycin]   Social History: Social History   Socioeconomic History  . Marital status: Married    Spouse name: Alinda Money  . Number of children: 2  . Years of education: Not on file  . Highest education level: Not on file  Occupational History  . Occupation: Hydrologist  Tobacco Use  . Smoking status: Never Smoker  . Smokeless tobacco: Never Used  Vaping Use  . Vaping Use: Never used  Substance and  Sexual Activity  . Alcohol use: No    Alcohol/week: 0.0 standard drinks  . Drug use: No  . Sexual activity: Yes    Partners: Male    Birth control/protection: Surgical    Comment: Ablation, Vasectomy  Other Topics Concern  . Not on file  Social History Narrative  . Not on file   Social Determinants of Health   Financial Resource Strain: Not on file  Food Insecurity: Not on file  Transportation Needs: Not on file  Physical Activity: Not on file  Stress: Not on file  Social Connections: Not on file     Family History: The patient's family history includes Anxiety disorder in her mother; Aortic stenosis in her mother; Cancer in her paternal grandmother; Diabetes in her mother; Hyperlipidemia in her mother; Obesity in her mother; Thyroid disease in her mother.  ROS:   All other ROS reviewed and negative. Pertinent positives noted in the  HPI.     EKGs/Labs/Other Studies Reviewed:   The following studies were personally reviewed by me today:  Zio 06/14/2020 1. No arrhythmias detected. Symptoms occurred with sinus rhythm and sinus tachycardia.  2. Rare ectopy.   CCTA 06/08/2020 1. Coronary calcium score of 0. 2. Normal coronary origin with right dominance. 3. Normal coronary arteries. 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  TTE 12/17/2018 1. The left ventricle has normal systolic function with an ejection  fraction of 60-65%. The cavity size was normal. There is mild asymmetric  left ventricular hypertrophy. Left ventricular diastolic Doppler  parameters are indeterminate. No evidence of  left ventricular regional wall motion abnormalities.  2. The right ventricle has normal systolic function. The cavity was  normal. There is no increase in right ventricular wall thickness.  3. The aortic root is normal in size and structure.  Recent Labs: 12/15/2019: ALT 25; Hemoglobin 14.5; Magnesium 2.2; Platelets 319; TSH 1.750 05/18/2020: BUN 11; Creatinine, Ser 0.71; Potassium 4.5; Sodium 141   Recent Lipid Panel    Component Value Date/Time   CHOL 261 (H) 12/15/2019 1605   TRIG 159 (H) 12/15/2019 1605   HDL 73 12/15/2019 1605   CHOLHDL 3.6 12/15/2019 1605   LDLCALC 160 (H) 12/15/2019 1605    Physical Exam:   VS:  BP 122/84 (BP Location: Left Arm, Patient Position: Sitting, Cuff Size: Large)   Pulse 87   Ht 5\' 2"  (1.575 m)   Wt 216 lb (98 kg)   BMI 39.51 kg/m    Wt Readings from Last 3 Encounters:  07/20/20 216 lb (98 kg)  05/18/20 217 lb (98.4 kg)  03/24/20 218 lb (98.9 kg)    General: Well nourished, well developed, in no acute distress Head: Atraumatic, normal size  Eyes: PEERLA, EOMI  Neck: Supple, no JVD Endocrine: No thryomegaly Cardiac: Normal S1, S2; RRR; no murmurs, rubs, or gallops Lungs: Clear to auscultation bilaterally, no wheezing, rhonchi or rales  Abd: Soft, nontender, no  hepatomegaly  Ext: No edema, pulses 2+ Musculoskeletal: No deformities, BUE and BLE strength normal and equal Skin: Warm and dry, no rashes   Neuro: Alert and oriented to person, place, time, and situation, CNII-XII grossly intact, no focal deficits  Psych: Normal mood and affect   ASSESSMENT:   [name] is a 55 y.o. female who presents for the following: 1. Chest pain, unspecified type   2. SOB (shortness of breath) on exertion   3. Palpitations   4. Obesity (BMI 30-39.9)     PLAN:   1.  Chest pain, unspecified type 2. SOB (shortness of breath) on exertion 3. Palpitations 4. Obesity (BMI 30-39.9) -She had chest pain or shortness of breath.  Also with palpitations.  Monitor normal.  Coronary CTA normal arteries.  Coronary calcium score 0.  Echocardiogram normal last year.  I think overall her symptoms are likely related to deconditioning and obesity.  I recommended regular exercise and weight loss.  She will work on this.  Moving forward, she will see [place] as needed.  Disposition: Return if symptoms worsen or fail to improve.  Medication Adjustments/Labs and Tests Ordered: Current medicines are reviewed at length with the patient today.  Concerns regarding medicines are outlined above.  No orders of the defined types were placed in this encounter.  No orders of the defined types were placed in this encounter.   Patient Instructions  Medication Instructions:  The current medical regimen is effective;  continue present plan and medications.  *If you need a refill on your cardiac medications before your next appointment, please call your pharmacy*   Follow-Up: At [facility], you and your health needs are our priority.  As part of our continuing mission to provide you with exceptional heart care, we have created designated Provider Care Teams.  These Care Teams include your primary Cardiologist (physician) and Advanced Practice Providers (APPs -  Physician Assistants and  Nurse Practitioners) who all work together to provide you with the care you need, when you need it.  We recommend signing up for the patient portal called "MyChart".  Sign up information is provided on this After Visit Summary.  MyChart is used to connect with patients for Virtual Visits (Telemedicine).  Patients are able to view lab/test results, encounter notes, upcoming appointments, etc.  Non-urgent messages can be sent to your provider as well.   To learn more about what you can do with MyChart, go to [email or web address].    Your next appointment:   As needed  The format for your next appointment:   In Person  Provider:   Lennie Odor, MD        Time Spent with Patient: I have spent a total of 25 minutes with patient reviewing hospital notes, telemetry, EKGs, labs and examining the patient as well as establishing an assessment and plan that was discussed with the patient.  > 50% of time was spent in direct patient care.  Signed, Lenna Gilford. Flora Lipps, MD [facility]  [address] [place], [place] [address] [phone]  07/20/2020 2:08 PM

## 2020-07-20 ENCOUNTER — Ambulatory Visit (INDEPENDENT_AMBULATORY_CARE_PROVIDER_SITE_OTHER): Payer: BC Managed Care – PPO | Admitting: Cardiovascular Disease

## 2020-07-20 ENCOUNTER — Encounter: Payer: Self-pay | Admitting: Cardiovascular Disease

## 2020-07-20 ENCOUNTER — Other Ambulatory Visit: Payer: Self-pay

## 2020-07-20 VITALS — BP 122/84 | HR 87 | Ht 62.0 in | Wt 216.0 lb

## 2020-07-20 DIAGNOSIS — R079 Chest pain, unspecified: Secondary | ICD-10-CM

## 2020-07-20 DIAGNOSIS — E669 Obesity, unspecified: Secondary | ICD-10-CM

## 2020-07-20 DIAGNOSIS — R002 Palpitations: Secondary | ICD-10-CM | POA: Diagnosis not present

## 2020-07-20 DIAGNOSIS — R0602 Shortness of breath: Secondary | ICD-10-CM

## 2020-07-20 NOTE — Patient Instructions (Signed)
Medication Instructions:  The current medical regimen is effective;  continue present plan and medications.  *If you need a refill on your cardiac medications before your next appointment, please call your pharmacy*    Follow-Up: At [facility], you and your health needs are our priority.  As part of our continuing mission to provide you with exceptional heart care, we have created designated Provider Care Teams.  These Care Teams include your primary Cardiologist (physician) and Advanced Practice Providers (APPs -  Physician Assistants and Nurse Practitioners) who all work together to provide you with the care you need, when you need it.  We recommend signing up for the patient portal called "MyChart".  Sign up information is provided on this After Visit Summary.  MyChart is used to connect with patients for Virtual Visits (Telemedicine).  Patients are able to view lab/test results, encounter notes, upcoming appointments, etc.  Non-urgent messages can be sent to your provider as well.   To learn more about what you can do with MyChart, go to [email or web address].    Your next appointment:   As needed  The format for your next appointment:   In Person  Provider:   Blackey O'Neal, MD      

## 2021-01-17 ENCOUNTER — Other Ambulatory Visit: Payer: Self-pay

## 2021-01-17 ENCOUNTER — Ambulatory Visit (INDEPENDENT_AMBULATORY_CARE_PROVIDER_SITE_OTHER): Payer: BC Managed Care – PPO | Admitting: Nurse Practitioner

## 2021-01-17 VITALS — BP 124/80

## 2021-01-17 DIAGNOSIS — R300 Dysuria: Secondary | ICD-10-CM

## 2021-01-17 DIAGNOSIS — B373 Candidiasis of vulva and vagina: Secondary | ICD-10-CM

## 2021-01-17 DIAGNOSIS — N898 Other specified noninflammatory disorders of vagina: Secondary | ICD-10-CM | POA: Diagnosis not present

## 2021-01-17 DIAGNOSIS — B3731 Acute candidiasis of vulva and vagina: Secondary | ICD-10-CM

## 2021-01-17 LAB — WET PREP FOR TRICH, YEAST, CLUE

## 2021-01-17 MED ORDER — FLUCONAZOLE 150 MG PO TABS
150.0000 mg | ORAL_TABLET | ORAL | 0 refills | Status: DC
Start: 2021-01-17 — End: 2021-03-01

## 2021-01-17 NOTE — Progress Notes (Addendum)
   Acute Office Visit  Subjective:    Patient ID: [name], female    DOB: Oct 13, 1965, 55 y.o.   MRN: [id number]   HPI 55 y.o. presents today for vaginal burning/itching and urinary frequency. Treated for UTI with Augmentin 01/09/2021 at Urgent Care. Completed course 3 days ago, which is when vaginal symptoms begin. She did apply steroid cream but feels it made the itching worse.    Review of Systems  Constitutional: Negative.   Genitourinary:  Positive for dysuria, frequency and vaginal pain (itching/burning). Negative for flank pain, hematuria, urgency and vaginal discharge.      Objective:    Physical Exam Constitutional:      Appearance: Normal appearance.  Genitourinary:    General: Normal vulva.     Vagina: Vaginal discharge and erythema present.     Cervix: Normal.    BP 124/80 (BP Location: Right Arm, Patient Position: Sitting, Cuff Size: Large)   LMP  (Approximate)  Wt Readings from Last 3 Encounters:  07/20/20 216 lb (98 kg)  05/18/20 217 lb (98.4 kg)  03/24/20 218 lb (98.9 kg)   Wet prep + yeast     Assessment & Plan:   Problem List Items Addressed This Visit   None Visit Diagnoses     Vaginal candidiasis    -  Primary   Relevant Medications   fluconazole (DIFLUCAN) 150 MG tablet   Burning with urination       Relevant Orders   Urinalysis,Complete w/RFL Culture   Vaginal itching       Relevant Orders   WET PREP FOR TRICH, YEAST, CLUE      Plan: Wet prep positive for yeast - Diflucan 150 mg today and repeat dose in 3 days if symptoms persist for total of 2 doses. UA unremarkable, reflex culture pending. She is agreeable to plan.      Olivia Mackie DNP, 12:36 PM 01/17/2021

## 2021-01-17 NOTE — Addendum Note (Signed)
Addended byWyline Beady on: 01/17/2021 12:36 PM   Modules accepted: Orders

## 2021-01-20 LAB — URINE CULTURE
MICRO NUMBER:: 12185446
SPECIMEN QUALITY:: ADEQUATE

## 2021-01-20 LAB — URINALYSIS, COMPLETE W/RFL CULTURE
Bilirubin Urine: NEGATIVE
Glucose, UA: NEGATIVE
Hyaline Cast: NONE SEEN /LPF
Ketones, ur: NEGATIVE
Nitrites, Initial: NEGATIVE
Protein, ur: NEGATIVE
RBC / HPF: NONE SEEN /HPF (ref 0–2)
Specific Gravity, Urine: 1.01 (ref 1.001–1.035)
pH: 7 (ref 5.0–8.0)

## 2021-01-20 LAB — CULTURE INDICATED

## 2021-01-21 ENCOUNTER — Encounter: Payer: Self-pay | Admitting: *Deleted

## 2021-01-24 ENCOUNTER — Other Ambulatory Visit: Payer: Self-pay | Admitting: *Deleted

## 2021-01-24 MED ORDER — NITROFURANTOIN MONOHYD MACRO 100 MG PO CAPS: 100.0000 mg | ORAL_CAPSULE | Freq: Two times a day (BID) | ORAL | 0 refills | Status: DC

## 2021-01-26 ENCOUNTER — Encounter: Payer: Self-pay | Admitting: Physician Assistant

## 2021-02-11 DIAGNOSIS — L259 Unspecified contact dermatitis, unspecified cause: Secondary | ICD-10-CM

## 2021-02-11 HISTORY — DX: Unspecified contact dermatitis, unspecified cause: L25.9

## 2021-03-01 ENCOUNTER — Encounter: Payer: Self-pay | Admitting: Physician Assistant

## 2021-03-01 ENCOUNTER — Ambulatory Visit (INDEPENDENT_AMBULATORY_CARE_PROVIDER_SITE_OTHER): Payer: BC Managed Care – PPO | Admitting: Physician Assistant

## 2021-03-01 VITALS — BP 128/78 | HR 80 | Ht 62.0 in | Wt 206.2 lb

## 2021-03-01 DIAGNOSIS — R1010 Upper abdominal pain, unspecified: Secondary | ICD-10-CM

## 2021-03-01 DIAGNOSIS — R140 Abdominal distension (gaseous): Secondary | ICD-10-CM | POA: Diagnosis not present

## 2021-03-01 MED ORDER — OMEPRAZOLE 40 MG PO CPDR: 40.0000 mg | DELAYED_RELEASE_CAPSULE | Freq: Every day | ORAL | 11 refills | Status: DC

## 2021-03-01 NOTE — Progress Notes (Signed)
Subjective:    Patient ID: [name], female    DOB: July 09, 1965, 54 y.o.   MRN: [id number]  HPI [name] is a 55 year old white female, new to GI today, self-referred for evaluation of abdominal pain, bloating distention and gas with belching. Patient has history of sleep apnea, obesity, hyperlipidemia. She had undergone prior colonoscopy per Dr. Charna Elizabeth in June 2018 for screening.  This was a normal exam and she was recommended to have 10-year interval follow-up.  She has not had prior EGD. She relates ongoing issues with burping and belching especially over this past year.  She has also had several more distinct episodes of abdominal discomfort/pain associated with abdominal distention.  With these episodes she has had discomfort around into her back.  She says these episodes mode last a few hours and then gradually resolve.  She may have some vague nausea with this but has not vomited..  No solid food dysphagia, she says she has had issues for a long time with occasional strangling with liquids which produces hard coughing.  No regular heartburn or indigestion.  She is not aware of any specific food triggers for her more distinct episodes of distention and bloating or for the ongoing gas burping and belching.  She is not consuming any carbonated beverages on a regular basis, no use of artificial sweeteners.  She has been on omeprazole 20 mg daily which has not helped her symptoms.  No regular NSAIDs, no recent new meds. Family history pertinent for a grandmother deceased secondary to colon cancer in her 61s. No prior abdominal surgery. She has not had any recent abdominal imaging or labs.  Review of Systems Pertinent positive and negative review of systems were noted in the above HPI section.  All other review of systems was otherwise negative.   Outpatient Encounter Medications as of 03/01/2021  Medication Sig   cholecalciferol (VITAMIN D3) 25 MCG (1000 UT) tablet Take 1,000 Units by  mouth daily.   clobetasol ointment (TEMOVATE) 0.05 % Apply 1 application topically 2 (two) times daily. Use for 2 weeks at a time for a flare.  You may use the cream twice a week at bedtime for maintenance dosing.   clonazePAM (KLONOPIN) 0.5 MG tablet Take 0.5 mg by mouth 2 (two) times daily as needed.   estradiol (ESTRACE) 0.1 MG/GM vaginal cream Use 1/2 g vaginally every night for the first 2 weeks, then use 1/2 g vaginally two or three times per week as needed to maintain symptom relief.   magnesium 30 MG tablet Take 30 mg by mouth 2 (two) times daily.   Omega-3 Fatty Acids (FISH OIL PO) Take by mouth daily.   venlafaxine XR (EFFEXOR-XR) 37.5 MG 24 hr capsule Take 37.5 mg by mouth daily.   [DISCONTINUED] omeprazole (PRILOSEC) 20 MG capsule Take 20 mg by mouth daily.   omeprazole (PRILOSEC) 40 MG capsule Take 1 capsule (40 mg total) by mouth daily with breakfast.   [DISCONTINUED] fluconazole (DIFLUCAN) 150 MG tablet Take 1 tablet (150 mg total) by mouth every 3 (three) days.   [DISCONTINUED] nitrofurantoin, macrocrystal-monohydrate, (MACROBID) 100 MG capsule Take 1 capsule (100 mg total) by mouth 2 (two) times daily.   [DISCONTINUED] Rhubarb (ESTROVEN COMPLETE PO) Take 1 tablet by mouth daily.   No facility-administered encounter medications on file as of 03/01/2021.   Allergies  Allergen Reactions   Zanaflex [Tizanidine Hcl]     Cant move   Biaxin [Clarithromycin]     Severe stomach cramps  Patient Active Problem List   Diagnosis Date Noted   Chronic foot pain 10/08/2019   Sleep disturbance 10/08/2019   Elevated LFTs 12/12/2018   Mixed hyperlipidemia 12/12/2018   Fen-phen history 11/24/2018   Prediabetes 11/24/2018   OSA (obstructive sleep apnea) 11/24/2018   Morbid obesity (HCC) 11/24/2018   Social History   Socioeconomic History   Marital status: Married    Spouse name: Alinda Money   Number of children: 2   Years of education: Not on file   Highest education level: Not on file   Occupational History   Occupation: Hydrologist   Occupation: Chemical engineer  Tobacco Use   Smoking status: Never   Smokeless tobacco: Never  Vaping Use   Vaping Use: Never used  Substance and Sexual Activity   Alcohol use: No    Alcohol/week: 0.0 standard drinks   Drug use: No   Sexual activity: Yes    Partners: Male    Birth control/protection: Surgical    Comment: Ablation, Vasectomy  Other Topics Concern   Not on file  Social History Narrative   Not on file   Social Determinants of Health   Financial Resource Strain: Not on file  Food Insecurity: Not on file  Transportation Needs: Not on file  Physical Activity: Not on file  Stress: Not on file  Social Connections: Not on file  Intimate Partner Violence: Not on file    [name]'s family history includes Anxiety disorder in her mother; Aortic stenosis in her mother; Colon cancer in her paternal grandmother; Diabetes in her mother; Heart disease in her paternal grandfather; Hyperlipidemia in her mother; Obesity in her mother; Thyroid disease in her mother.      Objective:    Vitals:   03/01/21 0943  BP: 128/78  Pulse: 80  SpO2: 96%    Physical Exam Well-developed well-nourished WFin no acute distress.  Height, Weight,206  BMI 37.7  HEENT; nontraumatic normocephalic, EOMI, PE R LA, sclera anicteric. Oropharynx; not examined Neck; supple, no JVD Cardiovascular; regular rate and rhythm with S1-S2, no murmur rub or gallop Pulmonary; Clear bilaterally Abdomen; soft, obese, nontender, nondistended, no palpable mass or hepatosplenomegaly, bowel sounds are active Rectal; not done today Skin; benign exam, no jaundice rash or appreciable lesions Extremities; no clubbing cyanosis or edema skin warm and dry Neuro/Psych; alert and oriented x4, grossly nonfocal mood and affect appropriate        Assessment & Plan:   #17 55 year old female with 1 year history of very  frequent belching and burping, and several more distinct episodes of abdominal pain/discomfort associated with abdominal distention bloating and gas which may last for a few hours and then gradually resolve.  Etiology of symptoms is not entirely clear-rule out poorly controlled GERD, rule out gallbladder disease/biliary colic.,  Rule out aerophagia.  Rule out possible SIBO  #2 colon cancer screening-up-to-date with negative colonoscopy June 2018/Dr. Mann #3 sleep apnea, no oxygen use 4.  Obesity 5.  Hyperlipidemia  Plan;  will schedule for upper abdominal ultrasound Increase omeprazole to 40 mg p.o. every morning AC breakfast Okay to continue use of Gas-X 1-2 with each meal. Start low gas diet. If ultrasound is negative and symptoms are persisting despite increased dose of PPI and dietary adjustments then will likely need EGD per Dr. Christella Hartigan, and consideration of breath testing to rule out SIBO.  Nancy Manuele Oswald Hillock PA-C 03/01/2021   Cc: Doreene Nest, NP

## 2021-03-01 NOTE — Patient Instructions (Signed)
If you are age 55 or younger, your body mass index should be between 19-25. Your Body mass index is 37.72 kg/m. If this is out of the aformentioned range listed, please consider follow up with your Primary Care Provider.  __________________________________________________________  The [facility] providers would like to encourage you to use [facility] to communicate with providers for non-urgent requests or questions.  Due to long hold times on the telephone, sending your provider a message by [facility] may be a faster and more efficient way to get a response.  Please allow 48 business hours for a response.  Please remember that this is for non-urgent requests.   You have been scheduled for an abdominal ultrasound at [facility] (1st floor of hospital) on 03/09/2021 at 9:00 am. Please arrive 15 minutes prior to your appointment for registration. Make certain not to have anything to eat or drink 6 hours prior to your appointment. Should you need to reschedule your appointment, please contact radiology at [phone]. This test typically takes about 30 minutes to perform.  Increase Omeprazole to 40 mg 1 capsule before breakfast.  Use Gas-X 1-2 tablets with meals  Follow a low gas diet.  Follow up pending.  Thank you for entrusting me with your care and choosing [facility].  Amy Esterwood, PA-C

## 2021-03-01 NOTE — Progress Notes (Signed)
I agree with the above note, plan 

## 2021-03-09 ENCOUNTER — Encounter: Payer: Self-pay | Admitting: Obstetrics and Gynecology

## 2021-03-09 ENCOUNTER — Ambulatory Visit (HOSPITAL_COMMUNITY)
Admission: RE | Admit: 2021-03-09 | Discharge: 2021-03-09 | Disposition: A | Discharge status: Home / Self Care | Payer: BC Managed Care – PPO | Source: Ambulatory Visit | Attending: Physician Assistant | Admitting: Physician Assistant

## 2021-03-09 ENCOUNTER — Other Ambulatory Visit: Payer: Self-pay

## 2021-03-09 DIAGNOSIS — R140 Abdominal distension (gaseous): Secondary | ICD-10-CM | POA: Insufficient documentation

## 2021-03-09 DIAGNOSIS — R1010 Upper abdominal pain, unspecified: Secondary | ICD-10-CM | POA: Diagnosis present

## 2021-03-14 ENCOUNTER — Other Ambulatory Visit: Payer: Self-pay

## 2021-03-14 DIAGNOSIS — R140 Abdominal distension (gaseous): Secondary | ICD-10-CM

## 2021-03-14 DIAGNOSIS — R7989 Other specified abnormal findings of blood chemistry: Secondary | ICD-10-CM

## 2021-03-21 ENCOUNTER — Telehealth: Payer: Self-pay

## 2021-03-21 NOTE — Telephone Encounter (Signed)
Pt returned phone call. Pt was notified that labs are needed to be collected. Pt was given the location and lab hours. Pt verbalized understanding with all questions answered.

## 2021-03-23 ENCOUNTER — Other Ambulatory Visit (INDEPENDENT_AMBULATORY_CARE_PROVIDER_SITE_OTHER): Payer: BC Managed Care – PPO

## 2021-03-23 DIAGNOSIS — R140 Abdominal distension (gaseous): Secondary | ICD-10-CM

## 2021-03-23 DIAGNOSIS — R7989 Other specified abnormal findings of blood chemistry: Secondary | ICD-10-CM | POA: Diagnosis not present

## 2021-03-23 LAB — HEPATIC FUNCTION PANEL
ALT: 30 U/L (ref 0–35)
AST: 25 U/L (ref 0–37)
Albumin: 4.2 g/dL (ref 3.5–5.2)
Alkaline Phosphatase: 94 U/L (ref 39–117)
Bilirubin, Direct: 0.1 mg/dL (ref 0.0–0.3)
Total Bilirubin: 0.3 mg/dL (ref 0.2–1.2)
Total Protein: 7.1 g/dL (ref 6.0–8.3)

## 2021-03-23 LAB — PROTIME-INR
INR: 1 ratio (ref 0.8–1.0)
Prothrombin Time: 10.9 s (ref 9.6–13.1)

## 2021-03-24 ENCOUNTER — Encounter: Payer: Self-pay | Admitting: Obstetrics and Gynecology

## 2021-03-24 LAB — HEPATITIS B SURFACE ANTIGEN: Hepatitis B Surface Ag: NONREACTIVE

## 2021-03-24 LAB — HEPATITIS C ANTIBODY
Hepatitis C Ab: NONREACTIVE
SIGNAL TO CUT-OFF: 0 (ref <1.00)

## 2021-04-04 ENCOUNTER — Telehealth: Payer: Self-pay

## 2021-04-04 ENCOUNTER — Other Ambulatory Visit: Payer: Self-pay

## 2021-04-04 DIAGNOSIS — R1010 Upper abdominal pain, unspecified: Secondary | ICD-10-CM

## 2021-04-04 DIAGNOSIS — R140 Abdominal distension (gaseous): Secondary | ICD-10-CM

## 2021-04-04 NOTE — Telephone Encounter (Signed)
Called the patient and discussed the next step for diagnosing her GI symptoms as outlined by Mike Gip, PA-C. The patient agrees to an endoscopy and to the breath test for the SIBO. Instructions at the front desk for pick up by the patient. She understands that she will need a care partner. Denies use of diet medications, no known heart disease, no supplemental oxygen. Not allergic to soy or eggs. Ht 5' '' And weight is 205 lbs as reported by the patient. She denies knowledge of any difficult intubation history.

## 2021-04-06 ENCOUNTER — Other Ambulatory Visit: Payer: Self-pay | Admitting: Radiology

## 2021-04-06 DIAGNOSIS — Z9889 Other specified postprocedural states: Secondary | ICD-10-CM

## 2021-04-06 HISTORY — DX: Other specified postprocedural states: Z98.890

## 2021-04-07 ENCOUNTER — Other Ambulatory Visit: Payer: Self-pay | Admitting: Registered Nurse

## 2021-04-07 DIAGNOSIS — R911 Solitary pulmonary nodule: Secondary | ICD-10-CM

## 2021-04-08 ENCOUNTER — Ambulatory Visit: Payer: BC Managed Care – PPO | Admitting: Obstetrics and Gynecology

## 2021-04-11 ENCOUNTER — Encounter: Payer: Self-pay | Admitting: Obstetrics and Gynecology

## 2021-04-11 ENCOUNTER — Other Ambulatory Visit: Payer: Self-pay

## 2021-04-11 ENCOUNTER — Ambulatory Visit (INDEPENDENT_AMBULATORY_CARE_PROVIDER_SITE_OTHER): Payer: BC Managed Care – PPO | Admitting: Obstetrics and Gynecology

## 2021-04-11 VITALS — BP 118/80 | HR 81 | Ht 61.5 in | Wt 207.0 lb

## 2021-04-11 DIAGNOSIS — Z01419 Encounter for gynecological examination (general) (routine) without abnormal findings: Secondary | ICD-10-CM

## 2021-04-11 DIAGNOSIS — N9089 Other specified noninflammatory disorders of vulva and perineum: Secondary | ICD-10-CM | POA: Diagnosis not present

## 2021-04-11 MED ORDER — CLOBETASOL PROPIONATE 0.05 % EX OINT: 1.0000 "application " | TOPICAL_OINTMENT | Freq: Two times a day (BID) | CUTANEOUS | 1 refills | Status: DC

## 2021-04-11 NOTE — Patient Instructions (Signed)

## 2021-04-11 NOTE — Progress Notes (Signed)
55 y.o. G37P2002 Married Caucasian female here for annual exam.    Will see Dr. Dwain Sarna on 04/22/21.  She has had a right breast biopsy showing potential atypia.   She is not using any vaginal estrogen cream currently.   She has a split in the skin on the vulva.   She is seeing GI for bloating.   She is worried about ovarian cancer due to talc use.   She leaks urine with cough, laugh, sneeze for years.  Full loss of control. Can leak spontaneously.  Voids every 2 hours during the day.  Up once a night to void.  Wearing pads.  Kegel's do not help.   PCP: Lauretta Chester, FNP  No LMP recorded. Patient has had an ablation.           Sexually active: Yes.    The current method of family planning is vasectomy/ablation.    Exercising: Yes.     Weights 5 days/week Smoker:  no  Health Maintenance: Pap:  11-28-17 Neg:Neg HR HPV, 11-26-14 Neg History of abnormal Pap:  no MMG: 03-24-21 Diag.Bil/SEE REPORT IN EPIC Colonoscopy:  11/29/2016 normal;next 10 years BMD:  2021  Result :Normal with PCP TDaP:  03-08-18 Gardasil:   no HIV:Neg in preg Hep C: 03-23-21 Neg Screening Labs:  PCP Flu vaccine:  declines.    reports that she has never smoked. She has never used smokeless tobacco. She reports that she does not drink alcohol and does not use drugs.  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     clobetasol ointment (TEMOVATE) 0.05 % Apply 1 application topically 2 (two) times daily. Use for 2 weeks at a time for a flare.  You may use the cream twice a week at bedtime for maintenance dosing. 60 g 1   clonazePAM (KLONOPIN) 0.5 MG tablet Take 0.5 mg by mouth 2 (two) times daily as needed.     magnesium 30 MG tablet Take 30 mg by mouth 2 (two) times daily.     Omega-3 Fatty Acids (FISH OIL PO) Take by mouth daily.     omeprazole (PRILOSEC) 40 MG capsule Take 1 capsule (40 mg total) by mouth daily with breakfast. 30 capsule 11   venlafaxine XR (EFFEXOR-XR) 75 MG 24 hr capsule Take 75 mg by mouth daily.     No current facility-administered medications for this visit.    Family History  Problem Relation Age of Onset   Diabetes Mother    Hyperlipidemia Mother    Thyroid disease Mother    Anxiety disorder Mother    Obesity Mother    Aortic stenosis Mother    Colon  cancer Paternal Grandmother    Heart disease Paternal Grandfather    Pancreatic cancer Neg Hx    Stomach cancer Neg Hx    Liver disease Neg Hx    Esophageal cancer Neg Hx     Review of Systems  All other systems reviewed and are negative.  Exam:   BP 118/80   Pulse 81   Ht 5' 1.5" (1.562 m)   Wt 207 lb (93.9 kg)   SpO2 99%   BMI 38.48 kg/m     General appearance: alert, cooperative and appears stated age Head: normocephalic, without obvious abnormality, atraumatic Neck: no adenopathy, supple, symmetrical, trachea midline and thyroid normal to inspection and palpation Lungs: clear to auscultation bilaterally Breasts: normal appearance, no masses or tenderness, No nipple retraction or dimpling, No nipple discharge or bleeding, No axillary adenopathy Heart: regular rate and rhythm Abdomen: soft, non-tender; no masses, no organomegaly Extremities: extremities normal, atraumatic, no cyanosis or edema Skin: skin color, texture, turgor normal. No rashes or  lesions Lymph nodes: cervical, supraclavicular, and axillary nodes normal. Neurologic: grossly normal  Pelvic: External genitalia:  left introital hyperpigmented lesion, 6 - 7 mm.               No abnormal inguinal nodes palpated.              Urethra:  normal appearing urethra with no masses, tenderness or lesions              Bartholins and Skenes: normal                 Vagina: normal appearing vagina with normal color and discharge, no lesions              Cervix: no lesions              Pap taken: no. Bimanual Exam:  Uterus:  normal size, contour, position, consistency, mobility, non-tender              Adnexa: no mass, fullness, tenderness              Rectal exam: yes.  Confirms.              Anus:  normal sphincter tone, no lesions  Chaperone was present for exam:  yes.  Assessment:   Well woman visit with gynecologic exam. Status post endometrial ablation.  Postmenopausal female. Hyperpigmentation lesion of introitus.  Mixed incontinence.  Hx lichen planus of the vulva.  Biopsy in 2018.  Breast atypia. Abdominal bloating.  Elevated A1C.    Plan: Mammogram screening discussed. Self breast awareness reviewed. Pap and HR HPV as above. Guidelines for Calcium, Vitamin D, regular exercise program including cardiovascular and weight bearing exercise. Return for vulvar biopsy.  Rational explained.  Ok for her to take Clonazepam for her biopsy.  She will need to sign a consent form first. Weight loss, PT, and surgery discussed for treating urinary incontinence.  Consider medication for OAB.  Will need post void residual check first.  Refill of clobetasol ointment for lichen planus of vulva.  Consultation with general surgeon is planned.   I recommend avoiding estrogen treatments at this time. We reviewed talc and no definitive link to ovarian cancer in the Up to Date literature.  If her bloating persists after seeing GI, we can get a pelvic ultrasound.  Follow up annually  and prn.   After visit summary provided.

## 2021-04-12 ENCOUNTER — Other Ambulatory Visit: Payer: Self-pay

## 2021-04-12 DIAGNOSIS — N9089 Other specified noninflammatory disorders of vulva and perineum: Secondary | ICD-10-CM

## 2021-04-13 ENCOUNTER — Ambulatory Visit (AMBULATORY_SURGERY_CENTER): Payer: BC Managed Care – PPO | Admitting: Gastroenterology

## 2021-04-13 ENCOUNTER — Encounter: Payer: Self-pay | Admitting: Gastroenterology

## 2021-04-13 VITALS — BP 118/74 | HR 73 | Temp 97.5°F | Resp 12 | Ht 62.0 in | Wt 206.0 lb

## 2021-04-13 DIAGNOSIS — K2950 Unspecified chronic gastritis without bleeding: Secondary | ICD-10-CM | POA: Diagnosis not present

## 2021-04-13 DIAGNOSIS — R1010 Upper abdominal pain, unspecified: Secondary | ICD-10-CM

## 2021-04-13 DIAGNOSIS — K2970 Gastritis, unspecified, without bleeding: Secondary | ICD-10-CM | POA: Diagnosis present

## 2021-04-13 DIAGNOSIS — R140 Abdominal distension (gaseous): Secondary | ICD-10-CM

## 2021-04-13 MED ORDER — SODIUM CHLORIDE 0.9 % IV SOLN: 500.0000 mL | Freq: Once | INTRAVENOUS | Status: DC

## 2021-04-13 NOTE — Op Note (Signed)
[facility] Patient Name: [name] Procedure Date: 04/13/2021 9:35 AM MRN: [id number] Endoscopist: Rachael Fee , MD Age: 55 Referring MD:  Date of Birth: 1966/03/09 Gender: Female Account #: [id number] Procedure:                Upper GI endoscopy Indications:              Dyspepsia, Abdominal bloating, Nausea Medicines:                Monitored Anesthesia Care Procedure:                Pre-Anesthesia Assessment:                           - Prior to the procedure, a History and Physical                            was performed, and patient medications and                            allergies were reviewed. The patient's tolerance of                            previous anesthesia was also reviewed. The risks                            and benefits of the procedure and the sedation                            options and risks were discussed with the patient.                            All questions were answered, and informed consent                            was obtained. Prior Anticoagulants: The patient has                            taken no previous anticoagulant or antiplatelet                            agents. ASA Grade Assessment: III - A patient with                            severe systemic disease. After reviewing the risks                            and benefits, the patient was deemed in                            satisfactory condition to undergo the procedure.                           After obtaining informed consent, the endoscope was  passed under direct vision. Throughout the                            procedure, the patient's blood pressure, pulse, and                            oxygen saturations were monitored continuously. The                            Endoscope was introduced through the mouth, and                            advanced to the second part of duodenum. The upper                            GI endoscopy  was accomplished without difficulty.                            The patient tolerated the procedure well. Scope In: Scope Out: Findings:                 Mild inflammation characterized by erythema,                            friability and granularity was found in the gastric                            antrum. Biopsies were taken with a cold forceps for                            histology.                           The exam was otherwise without abnormality. Complications:            No immediate complications. Estimated blood loss:                            None. Estimated Blood Loss:     Estimated blood loss: none. Impression:               - Mild, non-specific distal gastritis. Biopsied to                            check for H. pylori.                           - The examination was otherwise normal. Recommendation:           - Patient has a contact number available for                            emergencies. The signs and symptoms of potential                            delayed complications were discussed with the  patient. Return to normal activities tomorrow.                            Written discharge instructions were provided to the                            patient.                           - Resume previous diet.                           - Continue present medications.                           - Await pathology results. If biopsies do not show                            H. pylori then will await results of SIBO breath                            testing that was previously ordered. May need                            gastric empyting scan pending the above. Rachael Fee, MD 04/13/2021 9:50:11 AM This report has been signed electronically.

## 2021-04-13 NOTE — Progress Notes (Signed)
Called to room to assist during endoscopic procedure.  Patient ID and intended procedure confirmed with present staff. Received instructions for my participation in the procedure from the performing physician.  

## 2021-04-13 NOTE — Patient Instructions (Signed)
Discharge instructions given. ?Biopsies taken. ?Resume previous medications. ?YOU HAD AN ENDOSCOPIC PROCEDURE TODAY AT THE [facility]:   Refer to the procedure report that was given to you for any specific questions about what was found during the examination.  If the procedure report does not answer your questions, please call your gastroenterologist to clarify.  If you requested that your care partner not be given the details of your procedure findings, then the procedure report has been included in a sealed envelope for you to review at your convenience later. ? ?YOU SHOULD EXPECT: Some feelings of bloating in the abdomen. Passage of more gas than usual.  Walking can help get rid of the air that was put into your GI tract during the procedure and reduce the bloating. If you had a lower endoscopy (such as a colonoscopy or flexible sigmoidoscopy) you may notice spotting of blood in your stool or on the toilet paper. If you underwent a bowel prep for your procedure, you may not have a normal bowel movement for a few days. ? ?Please Note:  You might notice some irritation and congestion in your nose or some drainage.  This is from the oxygen used during your procedure.  There is no need for concern and it should clear up in a day or so. ? ?SYMPTOMS TO REPORT IMMEDIATELY: ? ? ?Following upper endoscopy (EGD) ? Vomiting of blood or coffee ground material ? New chest pain or pain under the shoulder blades ? Painful or persistently difficult swallowing ? New shortness of breath ? Fever of 100?F or higher ? Black, tarry-looking stools ? ?For urgent or emergent issues, a gastroenterologist can be reached at any hour by calling [phone]. ?Do not use MyChart messaging for urgent concerns.  ? ? ?DIET:  We do recommend a small meal at first, but then you may proceed to your regular diet.  Drink plenty of fluids but you should avoid alcoholic beverages for 24 hours. ? ?ACTIVITY:  You should plan to take it  easy for the rest of today and you should NOT DRIVE or use heavy machinery until tomorrow (because of the sedation medicines used during the test).   ? ?FOLLOW UP: ?Our staff will call the number listed on your records 48-72 hours following your procedure to check on you and address any questions or concerns that you may have regarding the information given to you following your procedure. If we do not reach you, we will leave a message.  We will attempt to reach you two times.  During this call, we will ask if you have developed any symptoms of COVID 19. If you develop any symptoms (ie: fever, flu-like symptoms, shortness of breath, cough etc.) before then, please call [phone].  If you test positive for Covid 19 in the 2 weeks post procedure, please call and report this information to us.   ? ?If any biopsies were taken you will be contacted by phone or by letter within the next 1-3 weeks.  Please call us at [phone] if you have not heard about the biopsies in 3 weeks.  ? ? ?SIGNATURES/CONFIDENTIALITY: ?You and/or your care partner have signed paperwork which will be entered into your electronic medical record.  These signatures attest to the fact that that the information above on your After Visit Summary has been reviewed and is understood.  Full responsibility of the confidentiality of this discharge information lies with you and/or your care-partner.  ?

## 2021-04-13 NOTE — Progress Notes (Signed)
VS taken by DT 

## 2021-04-13 NOTE — Progress Notes (Signed)
Pt Drowsy. VSS. To PACU, report to RN. No anesthetic complications noted.  

## 2021-04-13 NOTE — Progress Notes (Signed)
HPI: This is a woman with dyspepsia   ROS: complete GI ROS as described in HPI, all other review negative.  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     magnesium 30 MG tablet Take 30 mg by mouth 2 (two) times daily.     Omega-3 Fatty Acids (FISH OIL PO) Take by mouth daily.     omeprazole (PRILOSEC) 40 MG capsule Take 1 capsule (40 mg total) by mouth daily with breakfast. 30 capsule 11   venlafaxine XR (EFFEXOR-XR) 75 MG 24 hr capsule Take 75 mg by mouth daily.     clobetasol ointment (TEMOVATE) 0.05 % Apply 1 application topically 2 (two) times daily. Use for 2 weeks at a time for a flare.  You may use the cream twice a week at bedtime for maintenance dosing. 60 g 1   clonazePAM (KLONOPIN) 0.5 MG tablet Take 0.5 mg by mouth 2 (two) times daily as needed.     Current Facility-Administered Medications  Medication Dose Route Frequency Provider Last Rate Last Admin   0.9 %  sodium chloride infusion  500 mL Intravenous Once Rachael Fee, MD        Allergies as of 04/13/2021 - Review Complete 04/13/2021  Allergen Reaction Noted   Zanaflex [tizanidine hcl]  04/23/2013   Biaxin [clarithromycin]  04/23/2013    Family History  Problem Relation Age of Onset    Diabetes Mother    Hyperlipidemia Mother    Thyroid disease Mother    Anxiety disorder Mother    Obesity Mother    Aortic stenosis Mother    Colon cancer Paternal Grandmother    Heart disease Paternal Grandfather    Pancreatic cancer Neg Hx    Stomach cancer Neg Hx    Liver disease Neg Hx    Esophageal cancer Neg Hx     Social History   Socioeconomic History   Marital status: Married    Spouse name: Alinda Money   Number of children: 2   Years of education: Not on file   Highest education level: Not on file  Occupational History   Occupation: Hydrologist   Occupation: Chemical engineer  Tobacco Use   Smoking status: Never   Smokeless tobacco: Never  Vaping Use   Vaping Use: Never used  Substance and Sexual Activity   Alcohol use: No    Alcohol/week: 0.0 standard drinks   Drug use: No   Sexual activity: Yes    Partners: Male    Birth control/protection: Surgical    Comment: Ablation, Vasectomy  Other Topics Concern  Not on file  Social History Narrative   Not on file   Social Determinants of Health   Financial Resource Strain: Not on file  Food Insecurity: Not on file  Transportation Needs: Not on file  Physical Activity: Not on file  Stress: Not on file  Social Connections: Not on file  Intimate Partner Violence: Not on file     Physical Exam: BP (!) 138/58   Pulse 75   Temp (!) 97.5 F (36.4 C)   Ht 5\' 2"  (1.575 m)   Wt 206 lb (93.4 kg)   SpO2 98%   BMI 37.68 kg/m  Constitutional: generally well-appearing Psychiatric: alert and oriented x3 Lungs: CTA bilaterally Heart: no MCR  Assessment and plan: 55 y.o. female with dyspepsia  For EGD today  Care is appropriate for the ambulatory setting.  53, MD [facility] 04/13/2021, 9:27 AM

## 2021-04-15 ENCOUNTER — Telehealth: Payer: Self-pay

## 2021-04-15 NOTE — Telephone Encounter (Signed)
  Follow up Call-  Call back number 04/13/2021  Post procedure Call Back phone  # [phone]  Permission to leave phone message Yes  Some recent data might be hidden     Patient questions:  Do you have a fever, pain , or abdominal swelling? No. Pain Score  0 *  Have you tolerated food without any problems? Yes.    Have you been able to return to your normal activities? Yes.    Do you have any questions about your discharge instructions: Diet   No. Medications  No. Follow up visit  No.  Do you have questions or concerns about your Care? No.  Actions: * If pain score is 4 or above: No action needed, pain <4. Have you developed a fever since your procedure? no  2.   Have you had an respiratory symptoms (SOB or cough) since your procedure? no  3.   Have you tested positive for COVID 19 since your procedure no  4.   Have you had any family members/close contacts diagnosed with the COVID 19 since your procedure?  no   If yes to any of these questions please route to Laverna Peace, RN and Karlton Lemon, RN

## 2021-04-15 NOTE — Telephone Encounter (Signed)
Left message on follow up call. 

## 2021-04-22 ENCOUNTER — Other Ambulatory Visit: Payer: Self-pay | Admitting: General Surgery

## 2021-04-22 DIAGNOSIS — N6489 Other specified disorders of breast: Secondary | ICD-10-CM

## 2021-04-26 ENCOUNTER — Other Ambulatory Visit: Payer: Self-pay

## 2021-04-26 ENCOUNTER — Ambulatory Visit
Admission: RE | Admit: 2021-04-26 | Discharge: 2021-04-26 | Disposition: A | Discharge status: Home / Self Care | Payer: BC Managed Care – PPO | Source: Ambulatory Visit | Attending: Registered Nurse | Admitting: Registered Nurse

## 2021-04-26 DIAGNOSIS — R911 Solitary pulmonary nodule: Secondary | ICD-10-CM

## 2021-04-27 ENCOUNTER — Other Ambulatory Visit (HOSPITAL_COMMUNITY)
Admission: RE | Admit: 2021-04-27 | Discharge: 2021-04-27 | Disposition: A | Discharge status: Home / Self Care | Payer: BC Managed Care – PPO | Source: Ambulatory Visit | Attending: Obstetrics and Gynecology | Admitting: Obstetrics and Gynecology

## 2021-04-27 ENCOUNTER — Encounter: Payer: Self-pay | Admitting: Obstetrics and Gynecology

## 2021-04-27 ENCOUNTER — Ambulatory Visit (INDEPENDENT_AMBULATORY_CARE_PROVIDER_SITE_OTHER): Payer: BC Managed Care – PPO | Admitting: Obstetrics and Gynecology

## 2021-04-27 DIAGNOSIS — N9089 Other specified noninflammatory disorders of vulva and perineum: Secondary | ICD-10-CM | POA: Insufficient documentation

## 2021-04-27 NOTE — Patient Instructions (Signed)
Vulva Biopsy, Care After °This sheet gives you information about how to care for yourself after your procedure. Your doctor may also give you more specific instructions. If you have problems or questions, contact your doctor. °What can I expect after the procedure? °After the procedure, it is common to have: °Slight bleeding from the biopsy site. °Soreness at the biopsy site. °Follow these instructions at home: °Biopsy site care ° °Follow instructions from your doctor about how to take care of your biopsy site. Make sure you: °Clean the area using water and mild soap two times a day or as told by your doctor. Gently pat the area dry. °If you were prescribed an antibiotic ointment, apply it as told by your doctor. Do not stop using the antibiotic even if your condition gets better. °Take a warm water bath (sitz bath) as needed to help with pain. A sitz bath is taken while you are sitting down. The water should only come up to your hips and cover your butt. °Leave stitches (sutures), skin glue, or skin tape (adhesive) strips in place. They may need to stay in place for 2 weeks or longer. If tape strips get loose and curl up, you may trim the loose edges. Do not remove tape strips completely unless your doctor says it is okay. °Check your biopsy area every day for signs of infection. Check for: °More redness, swelling, or pain. °More fluid or blood. °Warmth. °Pus or a bad smell. °Do not rub the biopsy area after peeing (urinating). °Gently pat the area dry, or use a bottle filled with warm water (peri-bottle) to clean the area. °Gently wipe from front to back. °Lifestyle °Wear loose, cotton underwear. °Do not wear tight pants. °Do not use a tampon, douche, or put anything in your vagina for at least 1 week or until your doctor says it is okay. °Do not have sex for at least 1 week or until your doctor says it is okay. °Do not exercise until your doctor says it is okay. °Do not swim or use a hot tub until your doctor  says it is okay. You may shower or take a sitz bath. °General instructions °Take over-the-counter and prescription medicines only as told by your doctor. °Use a sanitary pad until the bleeding stops. °Keep all follow-up visits as told by your doctor. This is important. °Contact a doctor if: °You have more redness, swelling, or pain around your biopsy site. °You have more fluid or blood coming from your biopsy site. °Your biopsy site feels warm when you touch it. °Medicines do not help with your pain. °Get help right away if: °You have a lot of bleeding from the vulva. °You have pus or a bad smell coming from your biopsy site. °You have a fever. °You have pain in the lower belly (abdomen). °Summary °After the procedure, it is common to have slight bleeding and soreness at the biopsy site. °Follow all instructions as told by your doctor. Clean the area with water and mild soap. Do not rub. Pat the area dry. °Take sitz baths as needed. Leave any stitches in place. °Check your biopsy site for infection. Signs include more redness, swelling, pain, fluid, or blood, or feeling warm when you touch it. °Get help right away if you have a lot of bleeding, a fever, pus or a bad smell, or pain in your lower belly. °This information is not intended to replace advice given to you by your health care provider. Make sure you discuss any   questions you have with your health care provider. °Document Revised: 12/05/2017 Document Reviewed: 12/06/2017 °Elsevier Patient Education © 2022 [employer] ° °

## 2021-04-27 NOTE — Progress Notes (Signed)
GYNECOLOGY  VISIT   HPI: 55 y.o.   Married  Caucasian  female   G2P2002 with No LMP recorded. Patient has had an ablation.   here for vulvar biopsy and vaginal lesion.     Hyperpigmented lesion of the left introitus.   Took Clonazepam this am at 6:00.  She frequently takes it upon waking.   GYNECOLOGIC HISTORY: No LMP recorded. Patient has had an ablation. Contraception: Vasectomy/Ablation Menopausal hormone therapy:  none Last mammogram:  03-24-21 Diag.Bil/SEE REPORT IN EPIC Last pap smear:   11-28-17 Neg:Neg HR HPV, 11-26-14 Neg        OB History     Gravida  2   Para  2   Term  2   Preterm      AB      Living  2      SAB      IAB      Ectopic      Multiple      Live Births                 Patient Active Problem List   Diagnosis Date Noted   Chronic foot pain 10/08/2019   Sleep disturbance 10/08/2019   Elevated LFTs 12/12/2018   Mixed hyperlipidemia 12/12/2018   Fen-phen history 11/24/2018   Prediabetes 11/24/2018   OSA (obstructive sleep apnea) 11/24/2018   Morbid obesity (HCC) 11/24/2018    Past Medical History:  Diagnosis Date   Anxiety    Back pain    Constipation    Elevated hemoglobin A1c 2020   Fatty liver    Hypercholesteremia    Joint pain    Kidney problem    Kidney stones    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   Migraines    Nausea in adult    Prediabetes    S/P breast biopsy 04/06/2021   right breast biopsy   Shortness of breath    Stress    Subclinical hypothyroidism 09/2009    Past Surgical History:  Procedure Laterality Date   COLONOSCOPY     LITHOTRIPSY     NOVASURE ABLATION  12/18/2006   ROTATOR CUFF REPAIR Right 06/20/2007    Current Outpatient Medications  Medication Sig Dispense Refill   cholecalciferol (VITAMIN D3) 25 MCG (1000 UT) tablet Take 1,000 Units by mouth daily.     clobetasol ointment (TEMOVATE) 0.05 % Apply 1 application topically 2 (two) times daily. Use for 2 weeks at a time for a flare.  You may  use the cream twice a week at bedtime for maintenance dosing. 60 g 1   clonazePAM (KLONOPIN) 0.5 MG tablet Take 0.5 mg by mouth 2 (two) times daily as needed.     magnesium 30 MG tablet Take 30 mg by mouth 2 (two) times daily.     Omega-3 Fatty Acids (FISH OIL PO) Take by mouth daily.     omeprazole (PRILOSEC) 40 MG capsule Take 1 capsule (40 mg total) by mouth daily with breakfast. 30 capsule 11   venlafaxine XR (EFFEXOR-XR) 75 MG 24 hr capsule Take 75 mg by mouth daily.     OZEMPIC, 0.25 OR 0.5 MG/DOSE, 2 MG/1.5ML SOPN Inject into the skin. (Patient not taking: Reported on 04/27/2021)     valACYclovir (VALTREX) 1000 MG tablet SMARTSIG:2 Tablet(s) By Mouth Every 12 Hours PRN     No current facility-administered medications for this visit.     ALLERGIES: Tizanidine hcl and Clarithromycin  Family History  Problem Relation Age  of Onset   Diabetes Mother    Hyperlipidemia Mother    Thyroid disease Mother    Anxiety disorder Mother    Obesity Mother    Aortic stenosis Mother    Colon cancer Paternal Grandmother    Heart disease Paternal Grandfather    Pancreatic cancer Neg Hx    Stomach cancer Neg Hx    Liver disease Neg Hx    Esophageal cancer Neg Hx     Social History   Socioeconomic History   Marital status: Married    Spouse name: Alinda Money   Number of children: 2   Years of education: Not on file   Highest education level: Not on file  Occupational History   Occupation: Hydrologist   Occupation: Chemical engineer  Tobacco Use   Smoking status: Never   Smokeless tobacco: Never  Vaping Use   Vaping Use: Never used  Substance and Sexual Activity   Alcohol use: No    Alcohol/week: 0.0 standard drinks   Drug use: No   Sexual activity: Yes    Partners: Male    Birth control/protection: Surgical    Comment: Ablation, Vasectomy  Other Topics Concern   Not on file  Social History Narrative   Not on file   Social Determinants of  Health   Financial Resource Strain: Not on file  Food Insecurity: Not on file  Transportation Needs: Not on file  Physical Activity: Not on file  Stress: Not on file  Social Connections: Not on file  Intimate Partner Violence: Not on file    Review of Systems  All other systems reviewed and are negative.  PHYSICAL EXAMINATION:    BP 130/68   Ht 5' 1.75" (1.568 m)   Wt 207 lb (93.9 kg)   BMI 38.17 kg/m     General appearance: alert, cooperative and appears stated age   Pelvic: External genitalia:  5 - 6 mm hyperpigmentation of the left perineum                Vulvar biopsy Consent done.  Sterile prep with betadine.  Local 1% lidocaine, lot [id number], exp 09/2022. 4 mm punch biopsy done.  2 interrupted sutures of 3/0 Vicryl.  Minimal EBL.  No complications.  Chaperone was present for exam:  Marchelle Folks, CMA  ASSESSMENT  Hyperpigmentation of the left perineum.  PLAN  Fu biopsy.  Precautions given.   An After Visit Summary was printed and given to the patient.

## 2021-05-02 LAB — SURGICAL PATHOLOGY

## 2021-05-10 ENCOUNTER — Encounter: Payer: Self-pay | Admitting: Gastroenterology

## 2021-05-11 ENCOUNTER — Other Ambulatory Visit: Payer: Self-pay | Admitting: Internal Medicine

## 2021-05-11 DIAGNOSIS — E041 Nontoxic single thyroid nodule: Secondary | ICD-10-CM

## 2021-05-11 NOTE — Telephone Encounter (Signed)
[name], Amy ordered this SIBO test.

## 2021-05-16 ENCOUNTER — Encounter: Payer: Self-pay | Admitting: Obstetrics and Gynecology

## 2021-05-20 ENCOUNTER — Encounter (HOSPITAL_BASED_OUTPATIENT_CLINIC_OR_DEPARTMENT_OTHER): Payer: Self-pay | Admitting: General Surgery

## 2021-05-20 ENCOUNTER — Other Ambulatory Visit: Payer: Self-pay | Admitting: Registered Nurse

## 2021-05-20 ENCOUNTER — Other Ambulatory Visit: Payer: Self-pay

## 2021-05-20 DIAGNOSIS — E041 Nontoxic single thyroid nodule: Secondary | ICD-10-CM

## 2021-05-23 ENCOUNTER — Ambulatory Visit
Admission: RE | Admit: 2021-05-23 | Discharge: 2021-05-23 | Disposition: A | Discharge status: Home / Self Care | Payer: Self-pay | Source: Ambulatory Visit | Attending: Registered Nurse | Admitting: Registered Nurse

## 2021-05-23 ENCOUNTER — Other Ambulatory Visit: Payer: Self-pay | Admitting: Registered Nurse

## 2021-05-23 DIAGNOSIS — E041 Nontoxic single thyroid nodule: Secondary | ICD-10-CM

## 2021-05-26 MED ORDER — ENSURE PRE-SURGERY PO LIQD: 296.0000 mL | Freq: Once | ORAL | Status: DC

## 2021-05-26 NOTE — Progress Notes (Signed)

## 2021-05-30 ENCOUNTER — Encounter (HOSPITAL_BASED_OUTPATIENT_CLINIC_OR_DEPARTMENT_OTHER): Payer: Self-pay | Admitting: General Surgery

## 2021-05-30 ENCOUNTER — Ambulatory Visit (HOSPITAL_BASED_OUTPATIENT_CLINIC_OR_DEPARTMENT_OTHER): Payer: BC Managed Care – PPO | Admitting: Certified Registered"

## 2021-05-30 ENCOUNTER — Encounter (HOSPITAL_BASED_OUTPATIENT_CLINIC_OR_DEPARTMENT_OTHER)
Admission: RE | Disposition: A | Discharge status: Home / Self Care | Payer: Self-pay | Source: Home / Self Care | Attending: General Surgery

## 2021-05-30 ENCOUNTER — Other Ambulatory Visit: Payer: Self-pay

## 2021-05-30 ENCOUNTER — Ambulatory Visit (HOSPITAL_BASED_OUTPATIENT_CLINIC_OR_DEPARTMENT_OTHER)
Admission: RE | Admit: 2021-05-30 | Discharge: 2021-05-30 | Disposition: A | Discharge status: Home / Self Care | Payer: BC Managed Care – PPO | Attending: General Surgery | Admitting: General Surgery

## 2021-05-30 DIAGNOSIS — N6011 Diffuse cystic mastopathy of right breast: Secondary | ICD-10-CM | POA: Insufficient documentation

## 2021-05-30 DIAGNOSIS — Z803 Family history of malignant neoplasm of breast: Secondary | ICD-10-CM | POA: Diagnosis not present

## 2021-05-30 DIAGNOSIS — R519 Headache, unspecified: Secondary | ICD-10-CM | POA: Insufficient documentation

## 2021-05-30 HISTORY — PX: RADIOACTIVE SEED GUIDED EXCISIONAL BREAST BIOPSY: SHX6490

## 2021-05-30 SURGERY — RADIOACTIVE SEED GUIDED BREAST BIOPSY
Anesthesia: General | Site: Breast | Laterality: Right

## 2021-05-30 MED ORDER — CEFAZOLIN SODIUM-DEXTROSE 2-4 GM/100ML-% IV SOLN
2.0000 g | INTRAVENOUS | Status: AC
  Administered 2021-05-30: 2 g via INTRAVENOUS

## 2021-05-30 MED ORDER — MEPERIDINE HCL 25 MG/ML IJ SOLN: 6.2500 mg | INTRAMUSCULAR | Status: DC | PRN

## 2021-05-30 MED ORDER — FENTANYL CITRATE (PF) 100 MCG/2ML IJ SOLN
INTRAMUSCULAR | Status: AC
  Filled 2021-05-30: qty 2

## 2021-05-30 MED ORDER — AMISULPRIDE (ANTIEMETIC) 5 MG/2ML IV SOLN: 10.0000 mg | Freq: Once | INTRAVENOUS | Status: DC | PRN

## 2021-05-30 MED ORDER — BUPIVACAINE HCL (PF) 0.25 % IJ SOLN
INTRAMUSCULAR | Status: DC | PRN
  Administered 2021-05-30: 10 mL

## 2021-05-30 MED ORDER — OXYCODONE HCL 5 MG PO TABS
ORAL_TABLET | ORAL | Status: AC
  Filled 2021-05-30: qty 1

## 2021-05-30 MED ORDER — LIDOCAINE HCL (CARDIAC) PF 100 MG/5ML IV SOSY
PREFILLED_SYRINGE | INTRAVENOUS | Status: DC | PRN
  Administered 2021-05-30: 60 mg via INTRAVENOUS

## 2021-05-30 MED ORDER — HYDROMORPHONE HCL 1 MG/ML IJ SOLN: 0.2500 mg | INTRAMUSCULAR | Status: DC | PRN

## 2021-05-30 MED ORDER — MIDAZOLAM HCL 2 MG/2ML IJ SOLN
INTRAMUSCULAR | Status: AC
  Filled 2021-05-30: qty 2

## 2021-05-30 MED ORDER — ACETAMINOPHEN 500 MG PO TABS
1000.0000 mg | ORAL_TABLET | ORAL | Status: AC
  Administered 2021-05-30: 1000 mg via ORAL

## 2021-05-30 MED ORDER — ACETAMINOPHEN 500 MG PO TABS
ORAL_TABLET | ORAL | Status: AC
  Filled 2021-05-30: qty 2

## 2021-05-30 MED ORDER — CEFAZOLIN SODIUM-DEXTROSE 2-4 GM/100ML-% IV SOLN
INTRAVENOUS | Status: AC
  Filled 2021-05-30: qty 100

## 2021-05-30 MED ORDER — OXYCODONE HCL 5 MG PO TABS
5.0000 mg | ORAL_TABLET | Freq: Once | ORAL | Status: AC | PRN
  Administered 2021-05-30: 5 mg via ORAL

## 2021-05-30 MED ORDER — DEXAMETHASONE SODIUM PHOSPHATE 4 MG/ML IJ SOLN
INTRAMUSCULAR | Status: DC | PRN
  Administered 2021-05-30: 4 mg via INTRAVENOUS

## 2021-05-30 MED ORDER — LACTATED RINGERS IV SOLN: INTRAVENOUS | Status: DC

## 2021-05-30 MED ORDER — CELECOXIB 200 MG PO CAPS
400.0000 mg | ORAL_CAPSULE | ORAL | Status: AC
  Administered 2021-05-30: 400 mg via ORAL

## 2021-05-30 MED ORDER — PROPOFOL 10 MG/ML IV BOLUS
INTRAVENOUS | Status: DC | PRN
  Administered 2021-05-30: 200 mg via INTRAVENOUS

## 2021-05-30 MED ORDER — ONDANSETRON HCL 4 MG/2ML IJ SOLN
INTRAMUSCULAR | Status: DC | PRN
  Administered 2021-05-30: 4 mg via INTRAVENOUS

## 2021-05-30 MED ORDER — EPHEDRINE 5 MG/ML INJ
INTRAVENOUS | Status: AC
  Filled 2021-05-30: qty 10

## 2021-05-30 MED ORDER — CELECOXIB 200 MG PO CAPS
ORAL_CAPSULE | ORAL | Status: AC
  Filled 2021-05-30: qty 1

## 2021-05-30 MED ORDER — PHENYLEPHRINE 40 MCG/ML (10ML) SYRINGE FOR IV PUSH (FOR BLOOD PRESSURE SUPPORT)
PREFILLED_SYRINGE | INTRAVENOUS | Status: AC
  Filled 2021-05-30: qty 30

## 2021-05-30 MED ORDER — PROPOFOL 500 MG/50ML IV EMUL
INTRAVENOUS | Status: DC | PRN
  Administered 2021-05-30: 25 ug/kg/min via INTRAVENOUS

## 2021-05-30 MED ORDER — FENTANYL CITRATE (PF) 100 MCG/2ML IJ SOLN
INTRAMUSCULAR | Status: DC | PRN
  Administered 2021-05-30 (×2): 50 ug via INTRAVENOUS

## 2021-05-30 MED ORDER — OXYCODONE HCL 5 MG/5ML PO SOLN: 5.0000 mg | Freq: Once | ORAL | Status: AC | PRN

## 2021-05-30 MED ORDER — PROMETHAZINE HCL 25 MG/ML IJ SOLN: 6.2500 mg | INTRAMUSCULAR | Status: DC | PRN

## 2021-05-30 MED ORDER — TRAMADOL HCL 50 MG PO TABS: 100.0000 mg | ORAL_TABLET | Freq: Four times a day (QID) | ORAL | 0 refills | Status: DC | PRN

## 2021-05-30 MED ORDER — CHLORHEXIDINE GLUCONATE CLOTH 2 % EX PADS: 6.0000 | MEDICATED_PAD | Freq: Once | CUTANEOUS | Status: DC

## 2021-05-30 MED ORDER — MIDAZOLAM HCL 5 MG/5ML IJ SOLN
INTRAMUSCULAR | Status: DC | PRN
  Administered 2021-05-30: 2 mg via INTRAVENOUS

## 2021-05-30 MED ORDER — CELECOXIB 200 MG PO CAPS
ORAL_CAPSULE | ORAL | Status: AC
  Filled 2021-05-30: qty 2

## 2021-05-30 SURGICAL SUPPLY — 42 items
ADH SKN CLS APL DERMABOND .7 (GAUZE/BANDAGES/DRESSINGS) ×1
APL PRP STRL LF DISP 70% ISPRP (MISCELLANEOUS) ×1
APPLIER CLIP 9.375 MED OPEN (MISCELLANEOUS)
APR CLP MED 9.3 20 MLT OPN (MISCELLANEOUS)
BINDER BREAST XLRG (GAUZE/BANDAGES/DRESSINGS) ×1 IMPLANT
BLADE SURG 15 STRL LF DISP TIS (BLADE) ×1 IMPLANT
BLADE SURG 15 STRL SS (BLADE) ×2
CHLORAPREP W/TINT 26 (MISCELLANEOUS) ×2 IMPLANT
CLIP APPLIE 9.375 MED OPEN (MISCELLANEOUS) IMPLANT
COVER BACK TABLE 60X90IN (DRAPES) ×2 IMPLANT
COVER MAYO STAND STRL (DRAPES) ×2 IMPLANT
COVER PROBE W GEL 5X96 (DRAPES) ×2 IMPLANT
DERMABOND ADVANCED (GAUZE/BANDAGES/DRESSINGS) ×1
DERMABOND ADVANCED .7 DNX12 (GAUZE/BANDAGES/DRESSINGS) ×1 IMPLANT
DRAPE LAPAROSCOPIC ABDOMINAL (DRAPES) ×2 IMPLANT
DRAPE UTILITY XL STRL (DRAPES) ×2 IMPLANT
DRSG TEGADERM 4X4.75 (GAUZE/BANDAGES/DRESSINGS) ×1 IMPLANT
ELECT COATED BLADE 2.86 ST (ELECTRODE) ×2 IMPLANT
ELECT REM PT RETURN 9FT ADLT (ELECTROSURGICAL) ×2
ELECTRODE REM PT RTRN 9FT ADLT (ELECTROSURGICAL) ×1 IMPLANT
GAUZE SPONGE 4X4 12PLY STRL LF (GAUZE/BANDAGES/DRESSINGS) ×1 IMPLANT
GLOVE SURG ENC MOIS LTX SZ7 (GLOVE) ×4 IMPLANT
GLOVE SURG UNDER POLY LF SZ7.5 (GLOVE) ×2 IMPLANT
GOWN STRL REUS W/ TWL LRG LVL3 (GOWN DISPOSABLE) ×2 IMPLANT
GOWN STRL REUS W/TWL LRG LVL3 (GOWN DISPOSABLE) ×4
KIT MARKER MARGIN INK (KITS) ×2 IMPLANT
NDL HYPO 25X1 1.5 SAFETY (NEEDLE) ×1 IMPLANT
NEEDLE HYPO 25X1 1.5 SAFETY (NEEDLE) ×2 IMPLANT
NS IRRIG 1000ML POUR BTL (IV SOLUTION) IMPLANT
PACK BASIN DAY SURGERY FS (CUSTOM PROCEDURE TRAY) ×2 IMPLANT
PENCIL SMOKE EVACUATOR (MISCELLANEOUS) ×2 IMPLANT
SLEEVE SCD COMPRESS KNEE MED (STOCKING) ×2 IMPLANT
SPONGE T-LAP 4X18 ~~LOC~~+RFID (SPONGE) ×2 IMPLANT
STRIP CLOSURE SKIN 1/2X4 (GAUZE/BANDAGES/DRESSINGS) ×2 IMPLANT
SUT MON AB 5-0 PS2 18 (SUTURE) ×1 IMPLANT
SUT VIC AB 2-0 SH 27 (SUTURE) ×2
SUT VIC AB 2-0 SH 27XBRD (SUTURE) ×1 IMPLANT
SUT VIC AB 3-0 SH 27 (SUTURE) ×2
SUT VIC AB 3-0 SH 27X BRD (SUTURE) ×1 IMPLANT
SYR CONTROL 10ML LL (SYRINGE) ×2 IMPLANT
TOWEL GREEN STERILE FF (TOWEL DISPOSABLE) ×2 IMPLANT
TRAY FAXITRON CT DISP (TRAY / TRAY PROCEDURE) ×2 IMPLANT

## 2021-05-30 NOTE — Transfer of Care (Signed)
Immediate Anesthesia Transfer of Care Note  Patient: SHACORA ZYNDA  Procedure(s) Performed: RADIOACTIVE SEED GUIDED EXCISIONAL RIGHT BREAST BIOPSY (Right: Breast)  Patient Location: PACU  Anesthesia Type:General  Level of Consciousness: awake, alert , oriented and patient cooperative  Airway & Oxygen Therapy: Patient Spontanous Breathing and Patient connected to face mask oxygen  Post-op Assessment: Report given to RN and Post -op Vital signs reviewed and stable  Post vital signs: Reviewed and stable  Last Vitals:  Vitals Value Taken Time  BP    Temp    Pulse 80 05/30/21 1204  Resp    SpO2 99 % 05/30/21 1204  Vitals shown include unvalidated device data.  Last Pain:  Vitals:   05/30/21 1100  TempSrc: Oral  PainSc: 0-No pain      Patients Stated Pain Goal: 2 (05/30/21 1100)  Complications: No notable events documented.

## 2021-05-30 NOTE — Anesthesia Postprocedure Evaluation (Signed)
Anesthesia Post Note  Patient: [name]  Procedure(s) Performed: RADIOACTIVE SEED GUIDED EXCISIONAL RIGHT BREAST BIOPSY (Right: Breast)     Patient location during evaluation: PACU Anesthesia Type: General Level of consciousness: awake and alert Pain management: pain level controlled Vital Signs Assessment: post-procedure vital signs reviewed and stable Respiratory status: spontaneous breathing, nonlabored ventilation and respiratory function stable Cardiovascular status: blood pressure returned to baseline and stable Postop Assessment: no apparent nausea or vomiting Anesthetic complications: no   No notable events documented.  Last Vitals:  Vitals:   05/30/21 1230 05/30/21 1246  BP: (!) 154/93 (!) 148/94  Pulse: 70 80  Resp: 16 16  Temp:  36.7 C  SpO2: 96% 97%    Last Pain:  Vitals:   05/30/21 1246  TempSrc: Oral  PainSc: 7                  Lowella Curb

## 2021-05-30 NOTE — Interval H&P Note (Signed)
History and Physical Interval Note:  05/30/2021 11:04 AM  [name]  has presented today for surgery, with the diagnosis of right breast calcification.  The various methods of treatment have been discussed with the patient and family. After consideration of risks, benefits and other options for treatment, the patient has consented to  Procedure(s): RADIOACTIVE SEED GUIDED EXCISIONAL RIGHT BREAST BIOPSY (Right) as a surgical intervention.  The patient's history has been reviewed, patient examined, no change in status, stable for surgery.  I have reviewed the patient's chart and labs.  Questions were answered to the patient's satisfaction.     [name]

## 2021-05-30 NOTE — Discharge Instructions (Addendum)
[facility] Office Phone Number [phone]   POST OP INSTRUCTIONS Take 400 mg of ibuprofen every 8 hours or 650 mg tylenol every 6 hours for next 72 hours then as needed. Use ice several times daily also. Always review your discharge instruction sheet given to you by the facility where your surgery was performed.  IF YOU HAVE DISABILITY OR FAMILY LEAVE FORMS, YOU MUST BRING THEM TO THE OFFICE FOR PROCESSING.  DO NOT GIVE THEM TO YOUR DOCTOR.  A prescription for pain medication may be given to you upon discharge.  Take your pain medication as prescribed, if needed.  If narcotic pain medicine is not needed, then you may take acetaminophen (Tylenol), naprosyn (Alleve) or ibuprofen (Advil) as needed. Take your usually prescribed medications unless otherwise directed If you need a refill on your pain medication, please contact your pharmacy.  They will contact our office to request authorization.  Prescriptions will not be filled after 5pm or on week-ends. You should eat very light the first 24 hours after surgery, such as soup, crackers, pudding, etc.  Resume your normal diet the day after surgery. Most patients will experience some swelling and bruising in the breast.  Ice packs and a good support bra will help.  Wear the breast binder provided or a sports bra for 72 hours day and night.  After that wear a sports bra during the day until you return to the office. Swelling and bruising can take several days to resolve.  It is common to experience some constipation if taking pain medication after surgery.  Increasing fluid intake and taking a stool softener will usually help or prevent this problem from occurring.  A mild laxative (Milk of Magnesia or Miralax) should be taken according to package directions if there are no bowel movements after 48 hours. Unless discharge instructions indicate otherwise, you may remove your bandages 48 hours after surgery and you may shower at that time.   You may have steri-strips (small skin tapes) in place directly over the incision.  These strips should be left on the skin for 7-10 days and will come off on their own.  If your surgeon used skin glue on the incision, you may shower in 24 hours.  The glue will flake off over the next 2-3 weeks.  Any sutures or staples will be removed at the office during your follow-up visit. ACTIVITIES:  You may resume regular daily activities (gradually increasing) beginning the next day.  Wearing a good support bra or sports bra minimizes pain and swelling.  You may have sexual intercourse when it is comfortable. You may drive when you no longer are taking prescription pain medication, you can comfortably wear a seatbelt, and you can safely maneuver your car and apply brakes. RETURN TO WORK:  ______________________________________________________________________________________ [name] should see your doctor in the office for a follow-up appointment approximately two weeks after your surgery.  Your doctor's nurse will typically make your follow-up appointment when she calls you with your pathology report.  Expect your pathology report 3-4 business days after your surgery.  You may call to check if you do not hear from [place] after three days. OTHER INSTRUCTIONS: _______________________________________________________________________________________________ _____________________________________________________________________________________________________________________________________ _____________________________________________________________________________________________________________________________________ _____________________________________________________________________________________________________________________________________  WHEN TO CALL DR WAKEFIELD: Fever over 101.0 Nausea and/or vomiting. Extreme swelling or bruising. Continued bleeding from incision. Increased pain, redness, or drainage from  the incision.  The clinic staff is available to answer your questions during regular business hours.  Please don't hesitate to call and ask to  speak to one of the nurses for clinical concerns.  If you have a medical emergency, go to the nearest emergency room or call 911.  A surgeon from [facility] is always on call at the hospital.  For further questions, please visit [email or web address]  May take NSAIDS (Ibuprofen, Motrin) after 5pm, if needed. May take Tylenol after 5pm, if needed.    Post Anesthesia Home Care Instructions  Activity: Get plenty of rest for the remainder of the day. A responsible individual must stay with you for 24 hours following the procedure.  For the next 24 hours, DO NOT: -Drive a car -Advertising copywriter -Drink alcoholic beverages -Take any medication unless instructed by your physician -Make any legal decisions or sign important papers.  Meals: Start with liquid foods such as gelatin or soup. Progress to regular foods as tolerated. Avoid greasy, spicy, heavy foods. If nausea and/or vomiting occur, drink only clear liquids until the nausea and/or vomiting subsides. Call your physician if vomiting continues.  Special Instructions/Symptoms: Your throat may feel dry or sore from the anesthesia or the breathing tube placed in your throat during surgery. If this causes discomfort, gargle with warm salt water. The discomfort should disappear within 24 hours.  If you had a scopolamine patch placed behind your ear for the management of post- operative nausea and/or vomiting:  1. The medication in the patch is effective for 72 hours, after which it should be removed.  Wrap patch in a tissue and discard in the trash. Wash hands thoroughly with soap and water. 2. You may remove the patch earlier than 72 hours if you experience unpleasant side effects which may include dry mouth, dizziness or visual disturbances. 3. Avoid touching the patch. Wash  your hands with soap and water after contact with the patch.

## 2021-05-30 NOTE — Anesthesia Procedure Notes (Signed)
Procedure Name: LMA Insertion Date/Time: 05/30/2021 11:32 AM Performed by: Sheryn Bison, CRNA Pre-anesthesia Checklist: Patient identified, Emergency Drugs available, Suction available and Patient being monitored Patient Re-evaluated:Patient Re-evaluated prior to induction Oxygen Delivery Method: Circle System Utilized Preoxygenation: Pre-oxygenation with 100% oxygen Induction Type: IV induction Ventilation: Mask ventilation without difficulty LMA: LMA inserted LMA Size: 4.0 Number of attempts: 1 Airway Equipment and Method: bite block Placement Confirmation: positive ETCO2 Tube secured with: Tape Dental Injury: Teeth and Oropharynx as per pre-operative assessment

## 2021-05-30 NOTE — H&P (Signed)
  This is a 55 year old female with no prior breast history who and a family history and a paternal aunt with breast cancer in her 10s who presents after undergoing a screening mammogram. She had no mass or discharge. She is noted to have a density breast. She had a grouped area of calcifications on the left and the right side. She went back for additional views and the left breast grouped calcifications are consistent with milk of calcium and are benign. There is a 9 mm area of right breast calcifications at 12:00 7 cm from the nipple. These underwent a biopsy and this shows a complex sclerosing lesion but there is a concern for a focus of some detached epithelium that was concerning for atypia and excision was recommended. She is referred to discuss this.  Review of Systems: A complete review of systems was obtained from the patient. I have reviewed this information and discussed as appropriate with the patient. See HPI as well for other ROS.  Review of Systems  All other systems reviewed and are negative.  Medical History: Past Medical History:  Diagnosis Date   Anxiety   There is no problem list on file for this patient.  Past Surgical History:  Procedure Laterality Date   ARTHROSCOPIC ROTATOR CUFF REPAIR    Allergies  Allergen Reactions   Tizanidine Hcl Other (See Comments)  Cant move   Clarithromycin Nausea  Severe stomach cramps   Current Outpatient Medications on File Prior to Visit  Medication Sig Dispense Refill   clobetasoL (TEMOVATE) 0.05 % ointment Apply topically   clonazePAM (KLONOPIN) 0.5 MG tablet Take by mouth   omeprazole (PRILOSEC) 40 MG DR capsule Take by mouth   cholecalciferol (VITAMIN D3) 1000 unit tablet Take by mouth   magnesium 30 mg tablet Take 30 mg by mouth 2 (two) times daily   venlafaxine (EFFEXOR-XR) 37.5 MG XR capsule TAKE 1 CAPSULE BY MOUTH EVERY DAY WITH FOOD   No current facility-administered medications on file prior to visit.   Family  History  Problem Relation Age of Onset   Breast cancer Mother    Social History   Tobacco Use  Smoking Status Never Smoker  Smokeless Tobacco Never Used    Social History   Socioeconomic History   Marital status: Married  Tobacco Use   Smoking status: Never Smoker   Smokeless tobacco: Never Used  Building services engineer Use: Never used  Substance and Sexual Activity   Alcohol use: Never   Drug use: Never   Objective:   Physical Exam Constitutional:  Appearance: Normal appearance.  Chest:  Breasts:  Right: No inverted nipple, mass or nipple discharge.  Left: No inverted nipple, mass or nipple discharge.  Lymphadenopathy:  Upper Body:  Right upper body: No supraclavicular or axillary adenopathy.  Left upper body: No supraclavicular or axillary adenopathy.  Neurological:  Mental Status: She is alert.    Assessment and Plan:   Breast calcification, right  Right breast radioactive seed guided excisional biopsy  We discussed options including observation but I think with a concern for atypia this area should be removed. We discussed a radioactive seed guided excisional biopsy at the surgery center with risks, recovery. Were going to proceed soon.

## 2021-05-30 NOTE — Anesthesia Preprocedure Evaluation (Signed)
Anesthesia Evaluation  Patient identified by MRN, date of birth, ID band Patient awake    Reviewed: Allergy & Precautions, H&P , NPO status , Patient's Chart, lab work & pertinent test results  Airway Mallampati: II  TM Distance: >3 FB Neck ROM: Full    Dental no notable dental hx.    Pulmonary sleep apnea ,    Pulmonary exam normal breath sounds clear to auscultation       Cardiovascular negative cardio ROS Normal cardiovascular exam Rhythm:Regular Rate:Normal     Neuro/Psych  Headaches, Anxiety negative psych ROS   GI/Hepatic negative GI ROS, Neg liver ROS,   Endo/Other  Hypothyroidism   Renal/GU negative Renal ROS  negative genitourinary   Musculoskeletal negative musculoskeletal ROS (+)   Abdominal (+) + obese,   Peds negative pediatric ROS (+)  Hematology negative hematology ROS (+)   Anesthesia Other Findings   Reproductive/Obstetrics negative OB ROS                             Anesthesia Physical Anesthesia Plan  ASA: 2  Anesthesia Plan: General   Post-op Pain Management:    Induction: Intravenous  PONV Risk Score and Plan: 3 and Ondansetron, Dexamethasone, Midazolam and Treatment may vary due to age or medical condition  Airway Management Planned: LMA  Additional Equipment:   Intra-op Plan:   Post-operative Plan: Extubation in OR  Informed Consent: I have reviewed the patients History and Physical, chart, labs and discussed the procedure including the risks, benefits and alternatives for the proposed anesthesia with the patient or authorized representative who has indicated his/her understanding and acceptance.     Dental advisory given  Plan Discussed with: CRNA  Anesthesia Plan Comments:         Anesthesia Quick Evaluation

## 2021-05-30 NOTE — Op Note (Signed)
Preoperative diagnosis: Right breast calcifications Postoperative diagnosis: Same as above Procedure: Right breast radioactive seed guided excisional biopsy Surgeon: Dr. Harden Mo Anesthesia: General Complications: None Specimens: Right breast tissue marked with paint containing seed and clip Estimated blood loss: Minimal Sponge and count was correct at completion Decision to recovery stable condition  Indications: This a 55 year old female who had a mammogram with calcifications on both sides.  The left breast was cleared.  The right breast at the 9 mm area of calcifications that underwent a biopsy that showed a complex sclerosing lesion but there was a focus concerning for some atypia and excision was recommended.  I saw her and she was agreeable to this.  She had a radioactive seed placed prior to beginning.  Procedure: After informed consent was obtained the patient was taken to the operating room.  I had her mammograms in the operating room.  She was given antibiotics.  SCDs were in place.  She was placed under general anesthesia without complication.  She was prepped and draped in the standard sterile surgical fashion.  A surgical timeout was then performed.  I identified the radioactive seed in the central portion of the breast.  I then infiltrated Marcaine.  I made a periareolar incision in order to hide the scar later.  I then dissected down deep on the chest wall.  I remove the seed and the surrounding tissue.  Mammogram confirmed removal of the seed and the clip.  Hemostasis was obtained.  I closed the breast tissue down with 2-0 Vicryl.  The skin was closed with 3-0 Vicryl and 5-0 Monocryl.  Glue and Steri-Strips were applied.  She tolerated this well was extubated and transferred to recovery stable.

## 2021-06-01 LAB — SURGICAL PATHOLOGY

## 2021-06-02 ENCOUNTER — Encounter (HOSPITAL_BASED_OUTPATIENT_CLINIC_OR_DEPARTMENT_OTHER): Payer: Self-pay | Admitting: General Surgery

## 2021-06-21 DIAGNOSIS — M791 Myalgia, unspecified site: Secondary | ICD-10-CM | POA: Insufficient documentation

## 2021-06-21 HISTORY — DX: Myalgia, unspecified site: M79.10

## 2021-06-27 ENCOUNTER — Telehealth: Payer: Self-pay

## 2021-06-27 NOTE — Telephone Encounter (Signed)
-----   Message from Sammuel Cooper, PA-C sent at 06/27/2021  1:24 PM EST ----- Regarding: SIBO test Please call pt and let her know SIBO test result finally made it to my desk and is positive - so would like to treat her with a course of Xifaxan 550 mg po TID x 14 days- then ask her to get back in touch with a progress report after she completes antibiotic - hopefully will feel much better

## 2021-06-28 ENCOUNTER — Other Ambulatory Visit: Payer: Self-pay

## 2021-06-28 MED ORDER — RIFAXIMIN 550 MG PO TABS: 550.0000 mg | ORAL_TABLET | Freq: Three times a day (TID) | ORAL | 0 refills | Status: AC

## 2021-06-28 NOTE — Telephone Encounter (Signed)
Patient advised of her results. Explained the Xifaxan treatment and the possibility that her insurance will not want to pay for it.  Confirmed the pharmacy as [facility] on [address].

## 2021-07-07 ENCOUNTER — Ambulatory Visit
Admission: RE | Admit: 2021-07-07 | Discharge: 2021-07-07 | Disposition: A | Discharge status: Home / Self Care | Payer: BC Managed Care – PPO | Source: Ambulatory Visit | Attending: Registered Nurse | Admitting: Registered Nurse

## 2021-07-07 ENCOUNTER — Other Ambulatory Visit (HOSPITAL_COMMUNITY)
Admission: RE | Admit: 2021-07-07 | Discharge: 2021-07-07 | Disposition: A | Discharge status: Home / Self Care | Payer: BC Managed Care – PPO | Source: Ambulatory Visit | Attending: Interventional Radiology | Admitting: Interventional Radiology

## 2021-07-07 ENCOUNTER — Other Ambulatory Visit: Payer: Self-pay

## 2021-07-07 DIAGNOSIS — E041 Nontoxic single thyroid nodule: Secondary | ICD-10-CM

## 2021-07-07 HISTORY — PX: BIOPSY THYROID: PRO38

## 2021-07-08 LAB — CYTOLOGY - NON PAP

## 2021-07-12 DIAGNOSIS — M545 Low back pain, unspecified: Secondary | ICD-10-CM

## 2021-07-12 HISTORY — DX: Low back pain, unspecified: M54.50

## 2021-07-18 DIAGNOSIS — M546 Pain in thoracic spine: Secondary | ICD-10-CM | POA: Insufficient documentation

## 2021-07-18 HISTORY — DX: Pain in thoracic spine: M54.6

## 2021-07-26 ENCOUNTER — Encounter (HOSPITAL_COMMUNITY): Payer: Self-pay

## 2021-08-24 ENCOUNTER — Encounter (HOSPITAL_COMMUNITY): Payer: Self-pay

## 2021-09-20 ENCOUNTER — Telehealth: Payer: Self-pay | Admitting: Obstetrics and Gynecology

## 2021-09-20 NOTE — Telephone Encounter (Signed)
Ok to remove from mammogram hold.  ? ?Right breast biopsy - benign fibrocystic change with calcifications.  ? ?

## 2021-09-21 NOTE — Telephone Encounter (Signed)
Removed from hold. ?

## 2021-09-22 NOTE — Telephone Encounter (Signed)
Encounter reviewed and closed.  

## 2021-11-02 ENCOUNTER — Encounter: Payer: Self-pay | Admitting: Obstetrics and Gynecology

## 2021-11-02 ENCOUNTER — Ambulatory Visit: Payer: BC Managed Care – PPO | Admitting: Obstetrics and Gynecology

## 2021-11-02 VITALS — BP 114/84 | HR 104

## 2021-11-02 DIAGNOSIS — N9089 Other specified noninflammatory disorders of vulva and perineum: Secondary | ICD-10-CM | POA: Diagnosis not present

## 2021-11-02 DIAGNOSIS — N393 Stress incontinence (female) (male): Secondary | ICD-10-CM | POA: Diagnosis not present

## 2021-11-02 NOTE — Progress Notes (Signed)
GYNECOLOGY  VISIT   HPI: 56 y.o.   Married  Caucasian  female   G2P2002 with No LMP recorded. Patient has had an ablation.   here for f/u from vulvar bx that was done 04/27/21. Pt reports since done she has been experiencing discomfort in that area with wiping and with intercourse. Pt states she also experiences some bleeding in the area at times. Pt also reports troubles with urinary incontinence, stress and urge.   Using vit E suppositories.  Vulvar biopsy of the left perineum on 04/27/21, showing squamous mucosa with hyperkeratosis.   Hx lichen planus of the vulva, biopsied in 2018.  Uses clobetasol one or two times per month.  Leaking urine with coughing, laughing, and sneezing.  Does not not leak with walking.  No leak for no reason.  DF - every 2 -3 hours. NF - once to twice a night.  She saw urology in the past and was told she was not emptying completely.  No medication prescribed. Patient does feel like she empties well.  No urgency.   BMs one to two per day.  No splinting.  No accidental leakage of stool. Some hemorrhoids.   No using a pad for the last month.  Having hot flashes at night in her legs.  Effexor was initially helping this.   GYNECOLOGIC HISTORY: No LMP recorded. Patient has had an ablation. Contraception: Vasectomy Menopausal hormone therapy: none Last mammogram: 03/08/21-birads 0; Diag 03/24/21-rt breast calcifications suspicious; Bx 05/30/21 - fibrocystic change with calcifications. No malignancy.  Original breast biopsy showed potential atypia.  Last pap smear: 11/28/17-WNL, HPV- neg, 11/26/14-WNL, 06/04/08- WNL        OB History     Gravida  2   Para  2   Term  2   Preterm      AB      Living  2      SAB      IAB      Ectopic      Multiple      Live Births                 Patient Active Problem List   Diagnosis Date Noted   Chronic foot pain 10/08/2019   Sleep disturbance 10/08/2019   Elevated LFTs 12/12/2018   Mixed  hyperlipidemia 12/12/2018   Fen-phen history 11/24/2018   Prediabetes 11/24/2018   OSA (obstructive sleep apnea) 11/24/2018   Morbid obesity (HCC) 11/24/2018    Past Medical History:  Diagnosis Date   Anxiety    Back pain    Constipation    Elevated hemoglobin A1c 2020   Fatty liver    Hypercholesteremia    Joint pain    Kidney problem    Kidney stones    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   Migraines    Nausea in adult    Prediabetes    S/P breast biopsy 04/06/2021   right breast biopsy   Shortness of breath    Stress    Subclinical hypothyroidism 09/2009    Past Surgical History:  Procedure Laterality Date   COLONOSCOPY     LITHOTRIPSY     NOVASURE ABLATION  12/18/2006   RADIOACTIVE SEED GUIDED EXCISIONAL BREAST BIOPSY Right 05/30/2021   Procedure: RADIOACTIVE SEED GUIDED EXCISIONAL RIGHT BREAST BIOPSY;  Surgeon: Emelia Loron, MD;  Location: [facility];  Service: General;  Laterality: Right;   ROTATOR CUFF REPAIR Right 06/20/2007    Current Outpatient Medications  Medication  Sig Dispense Refill   ASPIRIN PO Take by mouth.     cholecalciferol (VITAMIN D3) 25 MCG (1000 UT) tablet Take 1,000 Units by mouth daily.     clobetasol ointment (TEMOVATE) 0.05 % Apply 1 application topically 2 (two) times daily. Use for 2 weeks at a time for a flare.  You may use the cream twice a week at bedtime for maintenance dosing. 60 g 1   clonazePAM (KLONOPIN) 0.5 MG tablet Take 0.5 mg by mouth 2 (two) times daily as needed.     losartan-hydrochlorothiazide (HYZAAR) 50-12.5 MG tablet losartan 50 mg-hydrochlorothiazide 12.5 mg tablet  TAKE 1 TABLET BY MOUTH EVERY DAY FOR 90 DAYS     magnesium 30 MG tablet Take 30 mg by mouth 2 (two) times daily.     meloxicam (MOBIC) 15 MG tablet meloxicam 15 mg tablet  TAKE 1 TABLET BY MOUTH EVERY DAY     Omega-3 Fatty Acids (FISH OIL PO) Take by mouth daily.     omeprazole (PRILOSEC) 40 MG capsule Take 1 capsule (40 mg total) by mouth  daily with breakfast. 30 capsule 11   valACYclovir (VALTREX) 1000 MG tablet SMARTSIG:2 Tablet(s) By Mouth Every 12 Hours PRN     venlafaxine XR (EFFEXOR-XR) 75 MG 24 hr capsule Take 75 mg by mouth daily.     No current facility-administered medications for this visit.     ALLERGIES: Tizanidine hcl and Clarithromycin  Family History  Problem Relation Age of Onset   Diabetes Mother    Hyperlipidemia Mother    Thyroid disease Mother    Anxiety disorder Mother    Obesity Mother    Aortic stenosis Mother    Colon cancer Paternal Grandmother    Heart disease Paternal Grandfather    Pancreatic cancer Neg Hx    Stomach cancer Neg Hx    Liver disease Neg Hx    Esophageal cancer Neg Hx     Social History   Socioeconomic History   Marital status: Married    Spouse name: Alinda Money   Number of children: 2   Years of education: Not on file   Highest education level: Not on file  Occupational History   Occupation: Hydrologist   Occupation: Chemical engineer  Tobacco Use   Smoking status: Never   Smokeless tobacco: Never  Vaping Use   Vaping Use: Never used  Substance and Sexual Activity   Alcohol use: No    Alcohol/week: 0.0 standard drinks   Drug use: No   Sexual activity: Yes    Partners: Male    Birth control/protection: Surgical    Comment: Ablation, Vasectomy  Other Topics Concern   Not on file  Social History Narrative   Not on file   Social Determinants of Health   Financial Resource Strain: Not on file  Food Insecurity: Not on file  Transportation Needs: Not on file  Physical Activity: Not on file  Stress: Not on file  Social Connections: Not on file  Intimate Partner Violence: Not on file    Review of Systems  Genitourinary:  Positive for dyspareunia.   PHYSICAL EXAMINATION:    BP 114/84   Pulse (!) 104   SpO2 97%     General appearance: alert, cooperative and appears stated age   Pelvic: External genitalia:  1  mm opening of the skin of left perineum.  Treated with silver nitrate.               Urethra:  normal appearing urethra with no masses, tenderness or lesions              Bartholins and Skenes: normal                 Vagina: normal appearing vagina with normal color and discharge, no lesions.  Good anterior, uterine, and posterior vaginal support.               Cervix: no lesions                Bimanual Exam:  Uterus:  normal size, contour, position, consistency, mobility, non-tender              Adnexa: no mass, fullness, tenderness              Rectal exam:  Confirms.  Minor rectocele noted.               Anus:  normal sphincter tone, no lesions  Chaperone was present for exam:  Ladona Ridgelaylor, CMA  ASSESSMENT  Vulvar lesion.  Looks like a tiny bit of granulation tissue.  Treated with AgNO3 today.  Stress incontinence. History of voiding dysfunction.   PLAN  Urinalysis and reflex urinary culture.  Discussion of urinary incontinence - risk factors and treatment options.  We discussed observational management, pessary care, physical therapy, and surgical management with midurethral sling and cystoscopy. We discussed permanent mesh materials which are currently the most widely used for this procedure.  I discussed slings being 85 - 90% effective in treatment of stress incontinence.  Risks of slings including but not limited to erosions and exposure, cystotomy, urinary retention and slower voiding, increase in urgency symptoms, and urinary tract infections. Stress incontinence can recur.  Patient will need multichannel urodynamic testing.   I did also discussed weight loss and Kegel exercises.   I will refer her to Dr. Lanetta InchMichelle Schroeder for evaluation and treatment of her stress incontinence.     An After Visit Summary was printed and given to the patient.  38 min  total time was spent for this patient encounter, including preparation, face-to-face counseling with the patient,  coordination of care, and documentation of the encounter.

## 2021-11-02 NOTE — Patient Instructions (Signed)
Urinary Incontinence Urinary incontinence refers to a condition in which a person is unable to control where and when to pass urine. A person with this condition will urinate involuntarily. This means that the person urinates when he or she does not mean to. What are the causes? This condition may be caused by: Medicines. Infections. Constipation. Overactive bladder muscles. Weak bladder muscles. Weak pelvic floor muscles. These muscles provide support for the bladder, intestine, and, in women, the uterus. Enlarged prostate in men. The prostate is a gland near the bladder. When it gets too big, it can pinch the urethra. With the urethra blocked, the bladder can weaken and lose the ability to empty properly. Surgery. Emotional factors, such as anxiety, stress, or post-traumatic stress disorder (PTSD). Spinal cord injury, nerve injury, or other neurological conditions. Pelvic organ prolapse. This happens in women when organs move out of place and into the vagina. This movement can prevent the bladder and urethra from working properly. What increases the risk? The following factors may make you more likely to develop this condition: Age. The older you are, the higher the risk. Obesity. Being physically inactive. Pregnancy and childbirth. Menopause. Diseases that affect the nerves or spinal cord. Long-term, or chronic, coughing. This can increase pressure on the bladder and pelvic floor muscles. What are the signs or symptoms? Symptoms may vary depending on the type of urinary incontinence you have. They include: A sudden urge to urinate, and passing urine involuntarily before you can get to a bathroom (urge incontinence). Suddenly passing urine when doing activities that force urine to pass, such as coughing, laughing, exercising, or sneezing (stress incontinence). Needing to urinate often but urinating only a small amount, or constantly dribbling urine (overflow incontinence). Urinating  because you cannot get to the bathroom in time due to a physical disability, such as arthritis or injury, or due to a communication or thinking problem, such as Alzheimer's disease (functional incontinence). How is this diagnosed? This condition may be diagnosed based on: Your medical history. A physical exam. Tests, such as: Urine tests. X-rays of your kidney and bladder. Ultrasound. CT scan. Cystoscopy. In this procedure, a health care provider inserts a tube with a light and camera (cystoscope) through the urethra and into the bladder to check for problems. Urodynamic testing. These tests assess how well the bladder, urethra, and sphincter can store and release urine. There are different types of urodynamic tests, and they vary depending on what the test is measuring. To help diagnose your condition, your health care provider may recommend that you keep a log of when you urinate and how much you urinate. How is this treated? Treatment for this condition depends on the type of incontinence that you have and its cause. Treatment may include: Lifestyle changes, such as: Quitting smoking. Maintaining a healthy weight. Staying active. Try to get 150 minutes of moderate-intensity exercise every week. Ask your health care provider which activities are safe for you. Eating a healthy diet. Avoid high-fat foods, like fried foods. Avoid refined carbohydrates like white bread and white rice. Limit how much alcohol and caffeine you drink. Increase your fiber intake. Healthy sources of fiber include beans, whole grains, and fresh fruits and vegetables. Behavioral changes, such as: Pelvic floor muscle exercises. Bladder training, such as lengthening the amount of time between bathroom breaks, or using the bathroom at regular intervals. Using techniques to suppress bladder urges. This can include distraction techniques or controlled breathing exercises. Medicines, such as: Medicines to relax the  bladder   muscles and prevent bladder spasms. Medicines to help slow or prevent the growth of a man's prostate. Botox injections. These can help relax the bladder muscles. Treatments, such as: Using pulses of electricity to help change bladder reflexes (electrical nerve stimulation). For women, using a medical device to prevent urine leaks. This is a small, tampon-like, disposable device that is inserted into the urethra. Injecting collagen or carbon beads (bulking agents) into the urinary sphincter. These can help thicken tissue and close the bladder opening. Surgery. Follow these instructions at home: Lifestyle Limit alcohol and caffeine. These can fill your bladder quickly and irritate it. Keep yourself clean to help prevent odors and skin damage. Ask your health care provider about special skin creams and cleansers that can protect the skin from urine. Consider wearing pads or adult diapers. Make sure to change them regularly, and always change them right after experiencing incontinence. General instructions Take over-the-counter and prescription medicines only as told by your health care provider. Use the bathroom about every 3-4 hours, even if you do not feel the need to urinate. Try to empty your bladder completely every time. After urinating, wait a minute. Then try to urinate again. Make sure you are in a relaxed position while urinating. If your incontinence is caused by nerve problems, keep a log of the medicines you take and the times you go to the bathroom. Keep all follow-up visits. This is important. Where to find more information National Institute of Diabetes and Digestive and Kidney Diseases: www.niddk.nih.gov American Urology Association: www.urologyhealth.org Contact a health care provider if: You have pain that gets worse. Your incontinence gets worse. Get help right away if: You have a fever or chills. You are unable to urinate. You have redness in your groin area or  down your legs. Summary Urinary incontinence refers to a condition in which a person is unable to control where and when to pass urine. This condition may be caused by medicines, infection, weak bladder muscles, weak pelvic floor muscles, enlargement of the prostate (in men), or surgery. Factors such as older age, obesity, pregnancy and childbirth, menopause, neurological diseases, and chronic coughing may increase your risk for developing this condition. Types of urinary incontinence include urge incontinence, stress incontinence, overflow incontinence, and functional incontinence. This condition is usually treated first with lifestyle and behavioral changes, such as quitting smoking, eating a healthier diet, and doing regular pelvic floor exercises. Other treatment options include medicines, bulking agents, medical devices, electrical nerve stimulation, or surgery. This information is not intended to replace advice given to you by your health care provider. Make sure you discuss any questions you have with your health care provider. Document Revised: 01/09/2020 Document Reviewed: 01/09/2020 Elsevier Patient Education  2023 Elsevier Inc.  

## 2021-11-03 ENCOUNTER — Telehealth: Payer: Self-pay | Admitting: Obstetrics and Gynecology

## 2021-11-03 DIAGNOSIS — N393 Stress incontinence (female) (male): Secondary | ICD-10-CM

## 2021-11-03 LAB — URINALYSIS, COMPLETE W/RFL CULTURE
Bacteria, UA: NONE SEEN /HPF
Bilirubin Urine: NEGATIVE
Casts: NONE SEEN /LPF
Crystals: NONE SEEN /HPF
Glucose, UA: NEGATIVE
Hgb urine dipstick: NEGATIVE
Hyaline Cast: NONE SEEN /LPF
Ketones, ur: NEGATIVE
Leukocyte Esterase: NEGATIVE
Nitrites, Initial: NEGATIVE
Protein, ur: NEGATIVE
RBC / HPF: NONE SEEN /HPF (ref 0–2)
Specific Gravity, Urine: 1.01 (ref 1.001–1.035)
WBC, UA: NONE SEEN /HPF (ref 0–5)
Yeast: NONE SEEN /HPF
pH: 7 (ref 5.0–8.0)

## 2021-11-03 LAB — NO CULTURE INDICATED

## 2021-11-03 NOTE — Telephone Encounter (Signed)
Please make a referral for my patient to see Dr. Lanetta Inch for evaluation and treatment of urinary stress incontinence and possible voiding dysfunction.

## 2021-11-07 NOTE — Telephone Encounter (Signed)
Referral placed at [facility] they will call to schedule. 

## 2021-11-16 ENCOUNTER — Encounter: Payer: Self-pay | Admitting: Obstetrics and Gynecology

## 2021-12-01 ENCOUNTER — Encounter: Payer: Self-pay | Admitting: *Deleted

## 2021-12-04 NOTE — Telephone Encounter (Signed)
Appointment scheduled for 04/19/22. Encounter closed.

## 2021-12-08 ENCOUNTER — Ambulatory Visit (INDEPENDENT_AMBULATORY_CARE_PROVIDER_SITE_OTHER): Payer: BC Managed Care – PPO | Admitting: Nurse Practitioner

## 2021-12-08 ENCOUNTER — Encounter: Payer: Self-pay | Admitting: Nurse Practitioner

## 2021-12-08 VITALS — BP 120/72 | HR 90 | Ht 62.0 in | Wt 209.0 lb

## 2021-12-08 DIAGNOSIS — R6881 Early satiety: Secondary | ICD-10-CM

## 2021-12-08 DIAGNOSIS — R143 Flatulence: Secondary | ICD-10-CM | POA: Diagnosis not present

## 2021-12-08 DIAGNOSIS — R110 Nausea: Secondary | ICD-10-CM

## 2021-12-08 MED ORDER — FAMOTIDINE 20 MG PO TABS: 20.0000 mg | ORAL_TABLET | Freq: Two times a day (BID) | ORAL | 1 refills | Status: DC

## 2021-12-08 NOTE — Progress Notes (Signed)
12/08/2021 [name] [id number] 17-Sep-1965   Chief Complaint: Burping, nausea and flatulence   History of Present Illness: [name] is a 56 year old female with a past medical history of obesity, hypertension, hyperlipidemia, sleep apnea, prediabetes, multinodular goiter, fatty liver and GERD.  She was seen in office by Taylor Gip PA-C on 03/01/2021 for further evaluation regarding belching, dysphagia with liquids, abdominal pain and bloat. At that time, Omeprazole was increased from 20 to 40 mg QD. An abdominal sonogram was done 03/09/2021 which showed evidence of a fatty liver, gallbladder was normal. EGD 04/13/2021 showed mild gastritis without evidence of H. Pylori. SIBO breath test was positive and she was treated with Xifaxan tid x 14 days 06/2021.  She presents today with complaints of increased burping, nausea without vomiting, early satiety and increased flatulence.  Her abdominal bloat improved following the course of Xifaxan 06/2021 then eventually completely abated a few months ago.  She denies having any upper or lower abdominal pain.  She describes having nausea upon awakening in the morning which is fairly constant for the past year.  No vomiting.  She feels full more easily.  No weight loss.  She is taking omeprazole 40 mg once daily.  No further dysphagia.  No heartburn.  She is passing normal formed brown bowel movement daily.  She denies feeling constipated.  She has occasional hemorrhoidal swelling/discomfort without bleeding.  No black stools.  Her most recent colonoscopy was 11/2016 by Dr. Loreta [name] which was normal.  Maternal grandmother with history of colon cancer.  She eats a fairly healthy diet.  She does not chew gum or drink carbonated drinks.  She uses Stevia sweetener once monthly.  She drinks 1 cup of coffee daily.  No alcohol use.      Latest Ref Rng & Units 12/15/2019    4:05 PM 12/06/2018    9:11 AM 12/06/2018   12:00 AM  CBC  WBC 3.4 - 10.8 x10E3/uL 9.4   8.9    Hemoglobin 11.1 - 15.9 g/dL 08.6  57.8  46.9      Hematocrit 34.0 - 46.6 % 41.8  41.4  41      Platelets 150 - 450 x10E3/uL 319  286       This result is from an external source.       Latest Ref Rng & Units 03/23/2021    2:47 PM 05/18/2020    3:39 PM 12/15/2019    4:05 PM  CMP  Glucose 65 - 99 mg/dL  84  629   BUN 6 - 24 mg/dL  11  12   Creatinine 5.28 - 1.00 mg/dL  4.13  2.44   Sodium 010 - 144 mmol/L  141  140   Potassium 3.5 - 5.2 mmol/L  4.5  4.3   Chloride 96 - 106 mmol/L  101  101   CO2 20 - 29 mmol/L  26  25   Calcium 8.7 - 10.2 mg/dL  9.2  9.3   Total Protein 6.0 - 8.3 g/dL 7.1   7.1   Total Bilirubin 0.2 - 1.2 mg/dL 0.3   0.4   Alkaline Phos 39 - 117 U/L 94   108   AST 0 - 37 U/L 25   25   ALT 0 - 35 U/L 30   25     RUQ sonogram 03/09/2021: Gallbladder: No gallstones or wall thickening visualized. No sonographic Murphy sign noted by sonographer.   Common bile duct:  Diameter: 4.2 mm.   Liver: No focal lesion identified. The liver is diffusely echogenic. Portal vein is patent on color Doppler imaging with normal direction of blood flow towards the liver.   IVC: No abnormality visualized.   Pancreas: Visualized portion unremarkable.   Spleen: Size and appearance within normal limits.   Right Kidney: Length: 10.6 cm. Echogenicity within normal limits. No mass or hydronephrosis visualized.   Left Kidney: Length: 11.2 cm. Echogenicity within normal limits. No mass or hydronephrosis visualized.   Abdominal aorta: No aneurysm visualized.   Other findings: None.   IMPRESSION: 1. Echogenic liver likely related to diffuse hepatocellular disease.   PAST GI PROCEDURES:  EGD 04/13/2021 by Dr. Christella [name]: - Mild, non-specific distal gastritis. Biopsied to check for H. pylori. - The examination was otherwise normal. Surgical [P], gastric antrum and gastric body - MILD CHRONIC GASTRITIS WITHOUT ACTIVITY - NO H. PYLORI OR INTESTINAL METAPLASIA  IDENTIFIED  Colonoscopy 11/29/2016 by Dr. Charna [name]:  Normal colonoscopy  Good bowel prep Recall colonoscopy 10 years   Colonoscopy 01/20/2004 by Dr. Russella [name]: Normal colonoscopy   Current Outpatient Medications on File Prior to Visit  Medication Sig Dispense Refill   ASPIRIN PO Take by mouth.     cholecalciferol (VITAMIN D3) 25 MCG (1000 UT) tablet Take 1,000 Units by mouth daily.     clobetasol ointment (TEMOVATE) 0.05 % Apply 1 application topically 2 (two) times daily. Use for 2 weeks at a time for a flare.  You may use the cream twice a week at bedtime for maintenance dosing. 60 g 1   clonazePAM (KLONOPIN) 0.5 MG tablet Take 0.5 mg by mouth 2 (two) times daily as needed.     losartan-hydrochlorothiazide (HYZAAR) 50-12.5 MG tablet losartan 50 mg-hydrochlorothiazide 12.5 mg tablet  TAKE 1 TABLET BY MOUTH EVERY DAY FOR 90 DAYS     magnesium 30 MG tablet Take 30 mg by mouth 2 (two) times daily.     meloxicam (MOBIC) 15 MG tablet meloxicam 15 mg tablet  TAKE 1 TABLET BY MOUTH EVERY DAY     Omega-3 Fatty Acids (FISH OIL PO) Take by mouth daily.     valACYclovir (VALTREX) 1000 MG tablet SMARTSIG:2 Tablet(s) By Mouth Every 12 Hours PRN     venlafaxine XR (EFFEXOR-XR) 75 MG 24 hr capsule Take 75 mg by mouth daily.     No current facility-administered medications on file prior to visit.   Allergies  Allergen Reactions   Tizanidine Hcl Other (See Comments)    Cant move Cant move   Clarithromycin Nausea Only    Severe stomach cramps Severe stomach cramps    Current Medications, Allergies, Past Medical History, Past Surgical History, Family History and Social History were reviewed in [employer] record.  Review of Systems:   Constitutional: Negative for fever, sweats, chills or weight loss.  Respiratory: Negative for shortness of breath.   Cardiovascular: Negative for chest pain, palpitations and leg swelling.  Gastrointestinal: See HPI.  Musculoskeletal:  Negative for back pain or muscle aches.  Neurological: Negative for dizziness, headaches or paresthesias.   Physical Exam: BP 120/72   Pulse 90   Ht 5\' 2"  (1.575 m)   Wt 209 lb (94.8 kg)   SpO2 95%   BMI 38.23 kg/m   General: 56 year old female in no acute distress. Head: Normocephalic and atraumatic. Eyes: No scleral icterus. Conjunctiva pink . Ears: Normal auditory acuity. Mouth: Dentition intact. No ulcers or lesions.  Lungs: Clear throughout to auscultation. Heart: Regular  rate and rhythm, no murmur. Abdomen: Soft, nondistended.  Mild RLQ tenderness without rebound or guarding no masses or hepatomegaly. Normal bowel sounds x 4 quadrants.  Rectal: Deferred Musculoskeletal: Symmetrical with no gross deformities. Extremities: No edema. Neurological: Alert oriented x 4. No focal deficits.  Psychological: Alert and cooperative. Normal mood and affect  Assessment and Recommendations:  69) 56 year old female with nausea without vomiting x 1 year, increased belching and early satiety. EGD 03/2021 showed mild gastritis.  RUQ sonogram 02/2021 showed a normal gallbladder. -Gastric emptying study -FODMAP handout -Eat 3-4 small snacks sized meals daily -Stop Omeprazole -Famotidine 20 mg p.o. twice daily -Further recommendations to be determined after gastric emptying study completed  2) Abdominal bloat with positive SIBO test treated with Xifaxan 3 times daily x14 days 06/2021, resolved -I discussed scheduling an abdominal/pelvic CT if her abdominal bloat symptoms recur  3) Colon cancer screening. Grandmother with history of colon cancer. Normal colonoscopy 11/2016.  -Next colonoscopy due June/2028, earlier if symptoms warrant  4) Increased flatulence  -See plan in #1 regarding dietary recommendations -MiraLAX nightly as needed if stool output decreases  5) Hepatic steatosis. Normal LFTs 03/2021.  -Avoid weight gain

## 2021-12-08 NOTE — Progress Notes (Signed)
I agree with the above note, plan 

## 2021-12-08 NOTE — Patient Instructions (Signed)
You have been scheduled for a gastric emptying scan at [facility] on 01/03/22 at 7:30 am. Please arrive at least 15 minutes prior to your appointment for registration. Please make certain not to have anything to eat or drink after midnight the night before your test. Hold all stomach medications (ex: Zofran, phenergan, Reglan. Pepcid) 8 hours prior to your test. If you need to reschedule your appointment, please contact radiology scheduling at [phone]. ________________________________________________________ A gastric-emptying study measures how long it takes for food to move through your stomach. There are several ways to measure stomach emptying. In the most common test, you eat food that contains a small amount of radioactive material. A scanner that detects the movement of the radioactive material is placed over your abdomen to monitor the rate at which food leaves your stomach. This test normally takes about 4 hours to complete. ________________________________________________________  DISCONTINUE omeprazole. _____________________________________________________  We have sent the following medications to your pharmacy for you to pick up at your convenience: Pepcid 20 mg twice daily ______________________________________________________  Please purchase the following medications over the counter and take as directed: IBGard 1 capsule twice daily  ________________________________________________________ If you are age 56 or older, your body mass index should be between 23-30. Your Body mass index is 38.23 kg/m. If this is out of the aforementioned range listed, please consider follow up with your Primary Care Provider.  If you are age 56 or younger, your body mass index should be between 19-25. Your Body mass index is 38.23 kg/m. If this is out of the aformentioned range listed, please consider follow up with your Primary Care Provider.    ________________________________________________________  The [facility] providers would like to encourage you to use [facility] to communicate with providers for non-urgent requests or questions.  Due to long hold times on the telephone, sending your provider a message by [facility] may be a faster and more efficient way to get a response.  Please allow 48 business hours for a response.  Please remember that this is for non-urgent requests.  _______________________________________________________ Due to recent changes in healthcare laws, you may see the results of your imaging and laboratory studies on MyChart before your provider has had a chance to review them.  We understand that in some cases there may be results that are confusing or concerning to you. Not all laboratory results come back in the same time frame and the provider may be waiting for multiple results in order to interpret others.  Please give [place] 48 hours in order for your provider to thoroughly review all the results before contacting the office for clarification of your results. ________________________________________________________

## 2021-12-30 ENCOUNTER — Other Ambulatory Visit: Payer: Self-pay

## 2021-12-30 MED ORDER — FAMOTIDINE 20 MG PO TABS: 40.0000 mg | ORAL_TABLET | Freq: Two times a day (BID) | ORAL | 1 refills | Status: DC

## 2022-01-03 ENCOUNTER — Ambulatory Visit (HOSPITAL_COMMUNITY)
Admission: RE | Admit: 2022-01-03 | Discharge: 2022-01-03 | Disposition: A | Discharge status: Home / Self Care | Payer: BC Managed Care – PPO | Source: Ambulatory Visit | Attending: Nurse Practitioner | Admitting: Nurse Practitioner

## 2022-01-03 DIAGNOSIS — R143 Flatulence: Secondary | ICD-10-CM | POA: Insufficient documentation

## 2022-01-03 DIAGNOSIS — R110 Nausea: Secondary | ICD-10-CM | POA: Diagnosis present

## 2022-01-03 DIAGNOSIS — R6881 Early satiety: Secondary | ICD-10-CM | POA: Diagnosis present

## 2022-01-03 MED ORDER — TECHNETIUM TC 99M SULFUR COLLOID
2.0000 | Freq: Once | INTRAVENOUS | Status: AC | PRN
  Administered 2022-01-03: 2 via INTRAVENOUS

## 2022-01-09 ENCOUNTER — Other Ambulatory Visit: Payer: Self-pay | Admitting: Nurse Practitioner

## 2022-01-10 ENCOUNTER — Other Ambulatory Visit: Payer: Self-pay

## 2022-01-10 DIAGNOSIS — R1084 Generalized abdominal pain: Secondary | ICD-10-CM

## 2022-01-10 DIAGNOSIS — R110 Nausea: Secondary | ICD-10-CM

## 2022-01-10 DIAGNOSIS — R140 Abdominal distension (gaseous): Secondary | ICD-10-CM

## 2022-01-10 NOTE — Telephone Encounter (Signed)
[name], pls contact patient and send her to our lab to have a CBC, CMP and lipase level done. Pls schedule her for an abd/pelvic CT scan with contrast. Labs need to be done a few days prior to her CT date.  DX: Generalized abd pain, bloat, nausea. THX.

## 2022-01-16 ENCOUNTER — Other Ambulatory Visit (INDEPENDENT_AMBULATORY_CARE_PROVIDER_SITE_OTHER): Payer: BC Managed Care – PPO

## 2022-01-16 ENCOUNTER — Other Ambulatory Visit (HOSPITAL_COMMUNITY): Payer: BC Managed Care – PPO

## 2022-01-16 DIAGNOSIS — R140 Abdominal distension (gaseous): Secondary | ICD-10-CM

## 2022-01-16 DIAGNOSIS — R110 Nausea: Secondary | ICD-10-CM

## 2022-01-16 DIAGNOSIS — R1084 Generalized abdominal pain: Secondary | ICD-10-CM

## 2022-01-16 LAB — CBC WITH DIFFERENTIAL/PLATELET
Basophils Absolute: 0.1 10*3/uL (ref 0.0–0.1)
Basophils Relative: 0.8 % (ref 0.0–3.0)
Eosinophils Absolute: 0.4 10*3/uL (ref 0.0–0.7)
Eosinophils Relative: 3.9 % (ref 0.0–5.0)
HCT: 38.9 % (ref 36.0–46.0)
Hemoglobin: 13.3 g/dL (ref 12.0–15.0)
Lymphocytes Relative: 16 % (ref 12.0–46.0)
Lymphs Abs: 1.6 10*3/uL (ref 0.7–4.0)
MCHC: 34.1 g/dL (ref 30.0–36.0)
MCV: 90 fl (ref 78.0–100.0)
Monocytes Absolute: 1 10*3/uL (ref 0.1–1.0)
Monocytes Relative: 10.3 % (ref 3.0–12.0)
Neutro Abs: 6.8 10*3/uL (ref 1.4–7.7)
Neutrophils Relative %: 69 % (ref 43.0–77.0)
Platelets: 303 10*3/uL (ref 150.0–400.0)
RBC: 4.32 Mil/uL (ref 3.87–5.11)
RDW: 13.6 % (ref 11.5–15.5)
WBC: 9.9 10*3/uL (ref 4.0–10.5)

## 2022-01-16 LAB — COMPREHENSIVE METABOLIC PANEL
ALT: 45 U/L — ABNORMAL HIGH (ref 0–35)
AST: 38 U/L — ABNORMAL HIGH (ref 0–37)
Albumin: 4.1 g/dL (ref 3.5–5.2)
Alkaline Phosphatase: 88 U/L (ref 39–117)
BUN: 14 mg/dL (ref 6–23)
CO2: 29 mEq/L (ref 19–32)
Calcium: 9.3 mg/dL (ref 8.4–10.5)
Chloride: 96 mEq/L (ref 96–112)
Creatinine, Ser: 0.84 mg/dL (ref 0.40–1.20)
GFR: 77.92 mL/min (ref >60.00)
Glucose, Bld: 175 mg/dL — ABNORMAL HIGH (ref 70–99)
Potassium: 4.1 mEq/L (ref 3.5–5.1)
Sodium: 135 mEq/L (ref 135–145)
Total Bilirubin: 0.5 mg/dL (ref 0.2–1.2)
Total Protein: 7.2 g/dL (ref 6.0–8.3)

## 2022-01-16 LAB — LIPASE: Lipase: 14 U/L (ref 11.0–59.0)

## 2022-01-17 ENCOUNTER — Other Ambulatory Visit: Payer: Self-pay | Admitting: Home Modifications

## 2022-01-17 DIAGNOSIS — E042 Nontoxic multinodular goiter: Secondary | ICD-10-CM

## 2022-01-18 ENCOUNTER — Ambulatory Visit (HOSPITAL_COMMUNITY)
Admission: RE | Admit: 2022-01-18 | Discharge: 2022-01-18 | Disposition: A | Discharge status: Home / Self Care | Payer: BC Managed Care – PPO | Source: Ambulatory Visit | Attending: Nurse Practitioner | Admitting: Nurse Practitioner

## 2022-01-18 ENCOUNTER — Encounter (HOSPITAL_COMMUNITY): Payer: Self-pay

## 2022-01-18 DIAGNOSIS — R140 Abdominal distension (gaseous): Secondary | ICD-10-CM | POA: Diagnosis present

## 2022-01-18 DIAGNOSIS — R1084 Generalized abdominal pain: Secondary | ICD-10-CM | POA: Insufficient documentation

## 2022-01-18 DIAGNOSIS — R110 Nausea: Secondary | ICD-10-CM | POA: Diagnosis not present

## 2022-01-18 MED ORDER — IOHEXOL 300 MG/ML  SOLN
100.0000 mL | Freq: Once | INTRAMUSCULAR | Status: AC | PRN
  Administered 2022-01-18: 100 mL via INTRAVENOUS

## 2022-01-18 MED ORDER — SODIUM CHLORIDE (PF) 0.9 % IJ SOLN
INTRAMUSCULAR | Status: AC
  Filled 2022-01-18: qty 50

## 2022-01-20 ENCOUNTER — Ambulatory Visit
Admission: RE | Admit: 2022-01-20 | Discharge: 2022-01-20 | Disposition: A | Discharge status: Home / Self Care | Payer: BC Managed Care – PPO | Source: Ambulatory Visit | Attending: Home Modifications | Admitting: Home Modifications

## 2022-01-20 DIAGNOSIS — E042 Nontoxic multinodular goiter: Secondary | ICD-10-CM

## 2022-01-25 ENCOUNTER — Encounter (INDEPENDENT_AMBULATORY_CARE_PROVIDER_SITE_OTHER): Payer: Self-pay

## 2022-01-25 ENCOUNTER — Other Ambulatory Visit: Payer: Self-pay

## 2022-01-25 DIAGNOSIS — K760 Fatty (change of) liver, not elsewhere classified: Secondary | ICD-10-CM

## 2022-01-25 DIAGNOSIS — R7989 Other specified abnormal findings of blood chemistry: Secondary | ICD-10-CM

## 2022-01-30 ENCOUNTER — Other Ambulatory Visit: Payer: BC Managed Care – PPO

## 2022-02-14 ENCOUNTER — Ambulatory Visit: Payer: BC Managed Care – PPO | Admitting: Podiatry

## 2022-02-14 ENCOUNTER — Ambulatory Visit (INDEPENDENT_AMBULATORY_CARE_PROVIDER_SITE_OTHER): Payer: BC Managed Care – PPO

## 2022-02-14 DIAGNOSIS — M722 Plantar fascial fibromatosis: Secondary | ICD-10-CM | POA: Diagnosis not present

## 2022-02-14 DIAGNOSIS — R52 Pain, unspecified: Secondary | ICD-10-CM

## 2022-02-14 MED ORDER — BETAMETHASONE SOD PHOS & ACET 6 (3-3) MG/ML IJ SUSP
3.0000 mg | Freq: Once | INTRAMUSCULAR | Status: AC
  Administered 2022-02-14: 3 mg via INTRA_ARTICULAR

## 2022-02-14 MED ORDER — METHYLPREDNISOLONE 4 MG PO TBPK: ORAL_TABLET | ORAL | 0 refills | Status: DC

## 2022-02-14 MED ORDER — MELOXICAM 15 MG PO TABS: 15.0000 mg | ORAL_TABLET | Freq: Every day | ORAL | 1 refills | Status: DC

## 2022-02-14 NOTE — Progress Notes (Signed)
   Chief Complaint  Patient presents with   Foot Pain    Right foot heel pain.    Subjective: 56 y.o. female presenting today for new complaint of pain and tenderness to the right heel that has been going on since about March/April 2023.  Patient states that she developed right heel pain that has not gone away.  She wears Vionic's shoes.  She denies a history of injury.  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  Dermatology: Skin is warm, dry and supple bilateral lower extremities. Negative for open lesions or macerations bilateral.   Vascular: Dorsalis Pedis and Posterior Tibial pulses palpable bilateral.  Capillary fill time is immediate to all digits.  Neurological: Epicritic and protective threshold intact bilateral.   Musculoskeletal: Tenderness to palpation to the plantar aspect of the right heel along the plantar fascia. All other joints range of motion within normal limits bilateral. Strength 5/5 in all groups bilateral.   Radiographic exam: Normal osseous mineralization. Joint spaces preserved. No fracture/dislocation/boney destruction. No other soft tissue abnormalities or radiopaque foreign bodies.   Assessment: 1. Plantar fasciitis right  Plan of Care:  1. Patient evaluated. Xrays reviewed.   2. Injection of 0.5cc Celestone soluspan injected into the right plantar fascia  3. Rx for Medrol Dose Pack placed 4. Rx for  Meloxicam ordered for patient. 5. Plantar fascial band(s) dispensed 6. Instructed patient regarding therapies and modalities at home to alleviate symptoms.  7. Return to clinic in 4 weeks.     Felecia Shelling, DPM [facility]  Dr. Felecia Shelling, DPM    [address] [place], [place] [address]                Office [phone]  Fax [phone]

## 2022-02-24 ENCOUNTER — Other Ambulatory Visit: Payer: Self-pay | Admitting: Physician Assistant

## 2022-02-27 ENCOUNTER — Ambulatory Visit: Payer: BC Managed Care – PPO | Admitting: Podiatry

## 2022-03-05 ENCOUNTER — Other Ambulatory Visit: Payer: Self-pay | Admitting: Nurse Practitioner

## 2022-03-14 ENCOUNTER — Ambulatory Visit: Payer: BC Managed Care – PPO | Admitting: Podiatry

## 2022-03-14 DIAGNOSIS — M722 Plantar fascial fibromatosis: Secondary | ICD-10-CM | POA: Diagnosis not present

## 2022-03-14 NOTE — Progress Notes (Signed)
   Chief Complaint  Patient presents with   Plantar Fasciitis    Patient is here for follow-up for right foot plantar fasciitis she states that things are so much better.    Subjective: 56 y.o. female presenting today for follow-up evaluation of plantar fasciitis to the right heel that has been ongoing since about March/April 2023.  Patient states that she is significantly better.  She no longer has the severe pain and tenderness to the right plantar heel.  Overall improvement with the injection, prednisone pack, and meloxicam.  She also wears the plantar fascial brace daily and says it helps   Past Medical History:  Diagnosis Date   Anxiety    Back pain    Constipation    Elevated hemoglobin A1c 2020   Fatty liver    Hypercholesteremia    Intestinal bacterial overgrowth    Joint pain    Kidney problem    Kidney stones    Lichen planus 5102   vulva   Migraines    Nausea in adult    Prediabetes    S/P breast biopsy 04/06/2021   right breast biopsy   Shortness of breath    Stress    Subclinical hypothyroidism 09/2009     Objective: Physical Exam General: The patient is alert and oriented x3 in no acute distress.  Dermatology: Skin is warm, dry and supple bilateral lower extremities. Negative for open lesions or macerations bilateral.   Vascular: Dorsalis Pedis and Posterior Tibial pulses palpable bilateral.  Capillary fill time is immediate to all digits.  Neurological: Epicritic and protective threshold intact bilateral.   Musculoskeletal: Improved tenderness to palpation to the plantar aspect of the right heel along the plantar fascia. All other joints range of motion within normal limits bilateral. Strength 5/5 in all groups bilateral.   Radiographic exam RT foot 02/14/2022: Normal osseous mineralization. Joint spaces preserved. No fracture/dislocation/boney destruction. No other soft tissue abnormalities or radiopaque foreign bodies.   Assessment: 1. Plantar  fasciitis right  Plan of Care:  1. Patient evaluated.  2.  Patient declined injection today 3.  Continue meloxicam 15 mg daily 4.  Continue wearing Vionic sandals and wearing the plantar fascial brace 5.  Daily stretching exercises  6.  Return to clinic as needed  Edrick Kins, DPM [facility]  Dr. Edrick Kins, DPM    [address], [place] [address]                Office [phone]  Fax [phone]

## 2022-04-04 DIAGNOSIS — J3089 Other allergic rhinitis: Secondary | ICD-10-CM

## 2022-04-04 HISTORY — DX: Other allergic rhinitis: J30.89

## 2022-04-12 ENCOUNTER — Ambulatory Visit: Payer: BC Managed Care – PPO | Admitting: Obstetrics and Gynecology

## 2022-04-17 ENCOUNTER — Other Ambulatory Visit: Payer: Self-pay | Admitting: Registered Nurse

## 2022-04-17 DIAGNOSIS — D7389 Other diseases of spleen: Secondary | ICD-10-CM

## 2022-04-17 DIAGNOSIS — I10 Essential (primary) hypertension: Secondary | ICD-10-CM

## 2022-04-19 ENCOUNTER — Encounter: Payer: Self-pay | Admitting: Obstetrics and Gynecology

## 2022-04-19 ENCOUNTER — Ambulatory Visit (INDEPENDENT_AMBULATORY_CARE_PROVIDER_SITE_OTHER): Payer: BC Managed Care – PPO | Admitting: Obstetrics and Gynecology

## 2022-04-19 VITALS — BP 121/83 | HR 80 | Ht 61.75 in | Wt 195.0 lb

## 2022-04-19 DIAGNOSIS — N393 Stress incontinence (female) (male): Secondary | ICD-10-CM

## 2022-04-19 DIAGNOSIS — R350 Frequency of micturition: Secondary | ICD-10-CM | POA: Diagnosis not present

## 2022-04-19 DIAGNOSIS — N3281 Overactive bladder: Secondary | ICD-10-CM

## 2022-04-19 LAB — POCT URINALYSIS DIPSTICK
Bilirubin, UA: NEGATIVE
Blood, UA: NEGATIVE
Glucose, UA: POSITIVE — AB
Ketones, UA: NEGATIVE
Leukocytes, UA: NEGATIVE
Nitrite, UA: NEGATIVE
Protein, UA: NEGATIVE
Spec Grav, UA: 1.01 (ref 1.010–1.025)
Urobilinogen, UA: 0.2 E.U./dL
pH, UA: 6.5 (ref 5.0–8.0)

## 2022-04-19 MED ORDER — TROSPIUM CHLORIDE ER 60 MG PO CP24: 1.0000 | ORAL_CAPSULE | Freq: Every day | ORAL | 1 refills | Status: DC

## 2022-04-19 NOTE — Progress Notes (Signed)
[facility] New Patient Evaluation and Consultation  Referring Provider: Aundria Rud* PCP: [facility], [place] Date of Service: 04/19/2022  SUBJECTIVE Chief Complaint: New Patient (Initial Visit) [name] is a 56 y.o. female here for a consult on urinary incontinence./)  History of Present Illness: [name] is a 56 y.o. White or Caucasian female seen in consultation at the request of Dr. Yisroel Ramming for evaluation of urine leakage.    Review of records from Dr [name] significant for: Has bot stress and urge incontinence symptoms. Interested in sling.   Urinary Symptoms: Leaks urine with cough/ sneeze, laughing, exercise, lifting, with a full bladder, with movement to the bathroom, and with urgency. Reports she wets her clothing and has to get up at night.  Sometimes will just pour out. UUI = SUI Leaks 5-6 time(s) per days.  Pad use: 1-2 pads per day.   She is bothered by her UI symptoms. Has not tried any treatment previously leakage.   Day time voids 8-10.  Nocturia: 2 times per night to void. Voiding dysfunction: she does not empty her bladder well.  does not use a catheter to empty bladder.  When urinating, she feels the need to urinate multiple times in a row and to push on her belly or vagina to empty bladder Drinks: 1-2 cups coffee in AM, 5-6 water bottles per day  UTIs: 1 UTI's in the last year.   Denies history of blood in urine, kidney or bladder stones, pyelonephritis, bladder cancer, and kidney cancer  Pelvic Organ Prolapse Symptoms:                  She Denies a feeling of a bulge the vaginal area.    Bowel Symptom: Bowel movements: 1-2 time(s) per day Stool consistency: soft  Straining: no.  Splinting: no.  Incomplete evacuation: yes.  She Denies accidental bowel leakage / fecal incontinence Bowel regimen: none Last colonoscopy: Date 2005, Results Normal  Sexual Function Sexually active: yes.  Sexual  orientation: Straight Pain with sex: Yes at the vaginal opening- has a sensitive area at the opening that was previously biopsied and bleeds occasionally.   Pelvic Pain Denies pelvic pain   Past Medical History:  Past Medical History:  Diagnosis Date   Anxiety    Back pain    Constipation    Diabetes mellitus without complication (HCC)    Fatty liver    Hypercholesteremia    Intestinal bacterial overgrowth    Joint pain    Kidney problem    Kidney stones    Lichen planus [id number]   vulva   Migraines    Nausea in adult    S/P breast biopsy 04/06/2021   right breast biopsy   Shortness of breath    Stress    Subclinical hypothyroidism 09/2009   Reports that last Hgb A1c was 7.3% in Aug 2023.   Past Surgical History:   Past Surgical History:  Procedure Laterality Date   COLONOSCOPY     LITHOTRIPSY     NOVASURE ABLATION  12/18/2006   RADIOACTIVE SEED GUIDED EXCISIONAL BREAST BIOPSY Right 05/30/2021   Procedure: RADIOACTIVE SEED GUIDED EXCISIONAL RIGHT BREAST BIOPSY;  Surgeon: Rolm Bookbinder, MD;  Location: [facility];  Service: General;  Laterality: Right;   ROTATOR CUFF REPAIR Right 06/20/2007     Past OB/GYN History: OB History  Gravida Para Term Preterm AB Living  2 2 2      2  SAB IAB Ectopic Multiple Live Births          2    # Outcome Date GA Lbr Len/2nd Weight Sex Delivery Anes PTL Lv  2 Term      Vag-Spont     1 Term      Vag-Vacuum      Menopausal: Yes, at age 23 Last pap smear was 11/2017- negative  Any history of abnormal pap smears: no.   Medications: She has a current medication list which includes the following prescription(s): cholecalciferol, clobetasol ointment, clonazepam, famotidine, losartan-hydrochlorothiazide, magnesium, omega-3 fatty acids, trospium chloride, valacyclovir, and venlafaxine.   Allergies: Patient is allergic to tizanidine hcl and clarithromycin.   Social History:  Social History   Tobacco Use   Smoking  status: Never   Smokeless tobacco: Never  Vaping Use   Vaping Use: Never used  Substance Use Topics   Alcohol use: No    Alcohol/week: 0.0 standard drinks of alcohol   Drug use: No    Relationship status: married She lives with husband.   She is employed as a Engineer, maintenance (IT). Regular exercise: Yes:   History of abuse: No  Family History:   Family History  Problem Relation Age of Onset   Diabetes Mother    Hyperlipidemia Mother    Thyroid disease Mother    Anxiety disorder Mother    Obesity Mother    Aortic stenosis Mother    Colon cancer Paternal Grandmother    Heart disease Paternal Grandfather    Pancreatic cancer Neg Hx    Stomach cancer Neg Hx    Liver disease Neg Hx    Esophageal cancer Neg Hx      Review of Systems: Review of Systems  Constitutional:  Positive for malaise/fatigue. Negative for fever and weight loss.       Weight Gain  Respiratory:  Negative for cough, shortness of breath and wheezing.   Cardiovascular:  Negative for chest pain, palpitations and leg swelling.  Gastrointestinal:  Negative for abdominal pain and blood in stool.  Genitourinary:  Negative for dysuria.  Musculoskeletal:  Negative for myalgias.  Skin:  Negative for rash.  Neurological:  Negative for dizziness and headaches.  Endo/Heme/Allergies:  Does not bruise/bleed easily.  Psychiatric/Behavioral:  Negative for depression. The patient is not nervous/anxious.      OBJECTIVE Physical Exam: Vitals:   04/19/22 0856  BP: 121/83  Pulse: 80  Weight: 195 lb (88.5 kg)  Height: 5' 1.75" (1.568 m)    Physical Exam Constitutional:      General: She is not in acute distress. Pulmonary:     Effort: Pulmonary effort is normal.  Abdominal:     General: There is no distension.     Palpations: Abdomen is soft.     Tenderness: There is no abdominal tenderness. There is no rebound.  Musculoskeletal:        General: No swelling. Normal range of motion.  Skin:    General: Skin is warm and dry.      Findings: No rash.  Neurological:     Mental Status: She is alert and oriented to person, place, and time.  Psychiatric:        Mood and Affect: Mood normal.        Behavior: Behavior normal.      GU / Detailed Urogynecologic Evaluation:  Pelvic Exam: Normal external female genitalia; Bartholin's and Skene's glands normal in appearance; urethral meatus normal in appearance, no urethral masses or discharge.   CST: positive-  large volume  Speculum exam reveals normal vaginal mucosa without atrophy. Cervix normal appearance. Uterus normal single, nontender. Adnexa no mass, fullness, tenderness.     Pelvic floor strength III/V  Pelvic floor musculature: Right levator non-tender, Right obturator non-tender, Left levator non-tender, Left obturator non-tender  POP-Q:   POP-Q  -3                                            Aa   -3                                           Ba  -5.5                                              C   2.5                                            Gh  3.5                                            Pb  10                                            tvl   -3                                            Ap  -3                                            Bp  -9.5                                              D      Rectal Exam:  Normal external rectum  Post-Void Residual (PVR) by Bladder Scan: In order to evaluate bladder emptying, we discussed obtaining a postvoid residual and she agreed to this procedure.  Procedure: The ultrasound unit was placed on the patient's abdomen in the suprapubic region after the patient had voided. A PVR of 20 ml was obtained by bladder scan.  Laboratory Results: POC urine: negative   ASSESSMENT AND PLAN Ms. Polter is a 56 y.o. with:  1. SUI (stress urinary incontinence, female)   2. Urinary frequency   3. Overactive bladder    SUI - For treatment of stress urinary incontinence,  non-surgical options  include expectant management, weight loss, physical therapy, as well as a pessary.  Surgical options include a midurethral sling, Burch urethropexy, and transurethral injection of a bulking agent. - She is interested in a sling, handout provided. Demonstrated stress leakage today on exam. Will request surgery scheduling and have her return for pre op visit.   2. OAB - We discussed the symptoms of overactive bladder (OAB), which include urinary urgency, urinary frequency, nocturia, with or without urge incontinence.  While we do not know the exact etiology of OAB, several treatment options exist. We discussed management including behavioral therapy (decreasing bladder irritants, urge suppression strategies, timed voids, bladder retraining), physical therapy, medication; for refractory cases posterior tibial nerve stimulation, sacral neuromodulation, and intravesical botulinum toxin injection.  - Prescribed trospium 60mg  ER daily. For anticholinergic medications, we discussed the potential side effects of anticholinergics including dry eyes, dry mouth, constipation, cognitive impairment and urinary retention.  Return for pre op  Jaquita Folds, MD   Medical Decision Making:  - Reviewed/ ordered a clinical laboratory test  - Review and summation of prior records

## 2022-04-19 NOTE — Patient Instructions (Signed)
For treatment of stress urinary incontinence, which is leakage with physical activity/movement/strainging/coughing, we discussed expectant management versus nonsurgical options versus surgery. Nonsurgical options include weight loss, physical therapy, as well as a pessary.  Surgical options include a midurethral sling, which is a synthetic mesh sling that acts like a hammock under the urethra to prevent leakage of urine, a Burch urethropexy, and transurethral injection of a bulking agent.  We discussed the symptoms of overactive bladder (OAB), which include urinary urgency, urinary frequency, night-time urination, with or without urge incontinence.  We discussed management including behavioral therapy (decreasing bladder irritants by following a bladder diet, urge suppression strategies, timed voids, bladder retraining), physical therapy, medication; and for refractory cases posterior tibial nerve stimulation, sacral neuromodulation, and intravesical botulinum toxin injection.

## 2022-04-27 ENCOUNTER — Ambulatory Visit (INDEPENDENT_AMBULATORY_CARE_PROVIDER_SITE_OTHER): Payer: BC Managed Care – PPO | Admitting: Obstetrics and Gynecology

## 2022-04-27 ENCOUNTER — Encounter: Payer: Self-pay | Admitting: Obstetrics and Gynecology

## 2022-04-27 VITALS — BP 130/80 | HR 82 | Ht 61.0 in | Wt 195.0 lb

## 2022-04-27 DIAGNOSIS — N763 Subacute and chronic vulvitis: Secondary | ICD-10-CM | POA: Diagnosis not present

## 2022-04-27 DIAGNOSIS — Z01419 Encounter for gynecological examination (general) (routine) without abnormal findings: Secondary | ICD-10-CM | POA: Diagnosis not present

## 2022-04-27 DIAGNOSIS — N760 Acute vaginitis: Secondary | ICD-10-CM

## 2022-04-27 DIAGNOSIS — B9689 Other specified bacterial agents as the cause of diseases classified elsewhere: Secondary | ICD-10-CM

## 2022-04-27 LAB — WET PREP FOR TRICH, YEAST, CLUE

## 2022-04-27 MED ORDER — CLOBETASOL PROPIONATE 0.05 % EX OINT: 1.0000 | TOPICAL_OINTMENT | Freq: Two times a day (BID) | CUTANEOUS | 1 refills | Status: DC

## 2022-04-27 MED ORDER — NYSTATIN 100000 UNIT/GM EX OINT: 1.0000 | TOPICAL_OINTMENT | Freq: Two times a day (BID) | CUTANEOUS | 0 refills | Status: DC

## 2022-04-27 MED ORDER — VALACYCLOVIR HCL 1 G PO TABS: ORAL_TABLET | ORAL | 1 refills | Status: DC

## 2022-04-27 MED ORDER — METRONIDAZOLE 500 MG PO TABS: 500.0000 mg | ORAL_TABLET | Freq: Two times a day (BID) | ORAL | 0 refills | Status: DC

## 2022-04-27 NOTE — Patient Instructions (Signed)

## 2022-04-27 NOTE — Progress Notes (Signed)
56 y.o. G23P2002 Married Caucasian female here for annual exam, vaginal bleeding/pain during intercourse. Used Clobetasol, but stopped this.   States she self treated with 4 Diflucan while at the beach due to itching.  Using vit E suppositories.   Taking Jardiance and Metformin for new dx diabetes.   She saw Dr. Florian Buff and is planning for future surgery for urinary stress incontinence.  PCP:   Lauretta Chester, FNP She will see endocrinology next week.   No LMP recorded. Patient has had an ablation.           Sexually active: Yes.    The current method of family planning is ablation.    Exercising: Yes.    Gym/ health club routine includes light weights. Smoker:  no  Health Maintenance: Pap:   11/28/17-WNL, HPV- neg, 11/26/14-WNL, 06/04/08- WNL        History of abnormal Pap:  no MMG:  03/08/21-birads 0; Diag 03/24/21-rt breast calcifications suspicious; Bx 05/30/21 - fibrocystic change with calcifications. No malignancy.  Original breast biopsy showed potential atypia.  She has a mammogram scheduled for  05/09/22. Colonoscopy:  2018.  Next in 10 years.  BMD:  2 years ago - normal. TDaP:  2019 Gardasil:   no HIV:  neg in preg Hep C:  03/23/21 - neg Screening Labs:  PCP. Flu vaccine and Covid discussed.   reports that she has never smoked. She has never used smokeless tobacco. She reports that she does not drink alcohol and does not use drugs.  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   Migraines    Nausea in adult    S/P breast biopsy 04/06/2021   right breast biopsy   Shortness of breath    Stress    Subclinical hypothyroidism 09/2009    Past Surgical History:  Procedure Laterality Date   COLONOSCOPY     LITHOTRIPSY     NOVASURE  ABLATION  12/18/2006   RADIOACTIVE SEED GUIDED EXCISIONAL BREAST BIOPSY Right 05/30/2021   Procedure: RADIOACTIVE SEED GUIDED EXCISIONAL RIGHT BREAST BIOPSY;  Surgeon: Emelia Loron, MD;  Location: [facility];  Service: General;  Laterality: Right;   ROTATOR CUFF REPAIR Right 06/20/2007    Current Outpatient Medications  Medication Sig Dispense Refill   cholecalciferol (VITAMIN D3) 25 MCG (1000 UT) tablet Take 1,000 Units by mouth daily.     clobetasol ointment (TEMOVATE) 0.05 % Apply 1 application topically 2 (two) times daily. Use for 2 weeks at a time for a flare.  You may use the cream twice a week at bedtime for maintenance dosing. 60 g 1   losartan-hydrochlorothiazide (HYZAAR) 50-12.5 MG tablet losartan 50 mg-hydrochlorothiazide 12.5 mg tablet  TAKE 1 TABLET BY MOUTH EVERY DAY FOR 90 DAYS     Omega-3 Fatty Acids (FISH OIL PO) Take by mouth daily.     Trospium Chloride 60 MG CP24 Take 1 capsule (60 mg total) by mouth daily. 90 capsule 1   valACYclovir (VALTREX) 1000 MG tablet SMARTSIG:2 Tablet(s) By Mouth Every 12 Hours PRN     venlafaxine (EFFEXOR) 37.5 MG tablet Take 37.5 mg by mouth 2 (two) times daily.  No current facility-administered medications for this visit.    Family History  Problem Relation Age of Onset   Diabetes Mother    Hyperlipidemia Mother    Thyroid disease Mother    Anxiety disorder Mother    Obesity Mother    Aortic stenosis Mother    Colon cancer Paternal Grandmother    Heart disease Paternal Grandfather    Pancreatic cancer Neg Hx    Stomach cancer Neg Hx    Liver disease Neg Hx    Esophageal cancer Neg Hx     Review of Systems  See HPI.  Exam:   BP 130/80 (BP Location: Right Arm, Patient Position: Sitting, Cuff Size: Normal)   Pulse 82   Ht 5\' 1"  (1.549 m)   Wt 195 lb (88.5 kg)   BMI 36.84 kg/m     General appearance: alert, cooperative and appears stated age Head: normocephalic, without obvious abnormality,  atraumatic Neck: no adenopathy, supple, symmetrical, trachea midline and thyroid normal to inspection and palpation Lungs: clear to auscultation bilaterally Breasts: normal appearance, no masses or tenderness, No nipple retraction or dimpling, No nipple discharge or bleeding, No axillary adenopathy Heart: regular rate and rhythm Abdomen: soft, non-tender; no masses, no organomegaly Extremities: extremities normal, atraumatic, no cyanosis or edema Skin: skin color, texture, turgor normal. No rashes or lesions Lymph nodes: cervical, supraclavicular, and axillary nodes normal. Neurologic: grossly normal  Pelvic: External genitalia: erythema and fissures of the vulva.              No abnormal inguinal nodes palpated.              Urethra:  normal appearing urethra with no masses, tenderness or lesions              Bartholins and Skenes: normal                 Vagina: normal appearing vagina with normal color and discharge, no lesions              Cervix: no lesions              Pap taken: no Bimanual Exam:  Uterus:  normal size, contour, position, consistency, mobility, non-tender              Adnexa: no mass, fullness, tenderness              Rectal exam: yes.  Confirms.              Anus:  normal sphincter tone, no lesions  Chaperone was present for exam:  Kimalexis  Assessment:   Well woman visit with gynecologic exam. Status post endometrial ablation.  Postmenopausal female. Mixed incontinence.  Hx lichen planus of the vulva.  Biopsy in 2018.  Breast atypia. HSV I.   BV.   Plan: Mammogram screening discussed. Self breast awareness reviewed. Pap and HR HPV 2024.  Guidelines for Calcium, Vitamin D, regular exercise program including cardiovascular and weight bearing exercise. Refill of Clobetasol ointment.  Wet prep: clue cells, neg yeast, neg trichomonas.  Flagyl 500 mg po bid x 1 week. Valtrex Rx prn oral HSV.  Clobetasol Rx.  Nystatin ointment to vulva.  We discussed  Jardiance as a risk factor for vulvovaginal yeast infection.  Follow up annually and prn.   After visit summary provided.   Evaluation and treatment for chronic vulvitis in addition to annual exam today.

## 2022-04-28 ENCOUNTER — Ambulatory Visit
Admission: RE | Admit: 2022-04-28 | Discharge: 2022-04-28 | Disposition: A | Discharge status: Home / Self Care | Payer: No Typology Code available for payment source | Source: Ambulatory Visit | Attending: Registered Nurse | Admitting: Registered Nurse

## 2022-04-28 DIAGNOSIS — I10 Essential (primary) hypertension: Secondary | ICD-10-CM

## 2022-05-02 ENCOUNTER — Ambulatory Visit: Payer: BC Managed Care – PPO | Admitting: Podiatry

## 2022-05-05 ENCOUNTER — Ambulatory Visit (INDEPENDENT_AMBULATORY_CARE_PROVIDER_SITE_OTHER): Payer: BC Managed Care – PPO | Admitting: Internal Medicine

## 2022-05-05 ENCOUNTER — Encounter: Payer: Self-pay | Admitting: Internal Medicine

## 2022-05-05 VITALS — BP 124/82 | HR 88 | Ht 61.0 in | Wt 195.8 lb

## 2022-05-05 DIAGNOSIS — E042 Nontoxic multinodular goiter: Secondary | ICD-10-CM | POA: Diagnosis not present

## 2022-05-05 DIAGNOSIS — R1310 Dysphagia, unspecified: Secondary | ICD-10-CM

## 2022-05-05 DIAGNOSIS — E119 Type 2 diabetes mellitus without complications: Secondary | ICD-10-CM | POA: Diagnosis not present

## 2022-05-05 HISTORY — DX: Type 2 diabetes mellitus without complications: E11.9

## 2022-05-05 LAB — POCT GLYCOSYLATED HEMOGLOBIN (HGB A1C): Hemoglobin A1C: 6 % — AB (ref 4.0–5.6)

## 2022-05-05 MED ORDER — METFORMIN HCL ER 500 MG PO TB24: 1000.0000 mg | ORAL_TABLET | Freq: Every day | ORAL | 3 refills | Status: DC

## 2022-05-05 NOTE — Progress Notes (Signed)
Name: [name]  MRN/ DOB: [id number], Nov 15, 1965   Age/ Sex: 56 y.o., female    PCP: Fatima Sanger, FNP   Reason for Endocrinology Evaluation: Type 2 Diabetes Mellitus     Date of Initial Endocrinology Visit: 05/05/2022     PATIENT IDENTIFIER: [name] is a 56 y.o. female with a past medical history of OSA, DM and HTN. The patient presented for initial endocrinology clinic visit on 05/05/2022 for consultative assistance with her diabetes management.    HPI: [name] was    Diagnosed with DM 03/2021 Prior Medications tried/Intolerance: Metformin started 01/2022 and Jardiance 03/2022 Currently checking blood sugars 1-3 x / day Hypoglycemia episodes : no                Hemoglobin A1c has ranged from 5.7% in 2018, peaking at 6.5% in 03/2022. Patient required assistance for hypoglycemia:  Patient has required hospitalization within the last 1 year from hyper or hypoglycemia:   In terms of diet, the patient eats 2-3 meals a day , does not snack   She is having recurrent yeast infection with Jardiance   Walking limited due to plantar fasciitis  Has tingling of the hands      THYROID HISTORY: Patient was noted with multinodular goiter on thyroid ultrasound 04/2021.  She is S/P FNA of the right mid 2.8 cm nodule with atypia of undetermined significance (Bethesda category III). Per pt Afirma was benign   Denies local neck swelling but has occasional dysphagia  Denies heartburn , has chronic nausea that started before Metformin use.  Had extensive GI work-up   HOME DIABETES REGIMEN: Metformin 500 mg 2 tabs daily at night  Jardiance 10 mg daily     Statin: no ACE-I/ARB: yes Prior Diabetic Education: no  METER DOWNLOAD SUMMARY:  Fingerstick Blood Glucose Tests = 239 Average Number Tests/Day = 2.3 Overall Mean FS Glucose = 145 Standard Deviation = 28  BG Ranges: Low = 99 High = 200  BG Target % Results: % In target = 68 % Over target = 32 %  Under target = 0  Hypoglycemic Events/30 Days: BG < 50 = 0 Episodes of symptomatic severe hypoglycemia = 0    DIABETIC COMPLICATIONS: Microvascular complications:   Denies: CKD, retinopathy, neuropathy  Last eye exam: Completed 11/2021  Macrovascular complications:   Denies: CAD, PVD, CVA   PAST HISTORY: Past Medical History:  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   Migraines    Nausea in adult    S/P breast biopsy 04/06/2021   right breast biopsy   Shortness of breath    Stress    Subclinical hypothyroidism 09/2009   Past Surgical History:  Past Surgical History:  Procedure Laterality Date   COLONOSCOPY     LITHOTRIPSY     NOVASURE ABLATION  12/18/2006   RADIOACTIVE SEED GUIDED EXCISIONAL BREAST BIOPSY Right 05/30/2021   Procedure: RADIOACTIVE SEED GUIDED EXCISIONAL RIGHT BREAST BIOPSY;  Surgeon: Emelia Loron, MD;  Location: [facility];  Service: General;  Laterality: Right;   ROTATOR CUFF REPAIR Right 06/20/2007    Social History:  reports that she has  never smoked. She has never used smokeless tobacco. She reports that she does not drink alcohol and does not use drugs. Family History:  Family History  Problem Relation Age of Onset   Diabetes Mother    Hyperlipidemia Mother    Thyroid disease Mother    Anxiety disorder Mother    Obesity Mother    Aortic stenosis Mother    Colon cancer Paternal Grandmother    Heart disease Paternal Grandfather    Pancreatic cancer Neg Hx    Stomach cancer Neg Hx    Liver disease Neg Hx    Esophageal cancer Neg Hx      HOME MEDICATIONS: Allergies as of 05/05/2022       Reactions   Tizanidine Hcl Other (See Comments)   Cant move Cant move    Clarithromycin Nausea Only   Severe stomach cramps Severe stomach cramps        Medication List        Accurate as of May 05, 2022 10:22 AM. If you have any questions, ask your nurse or doctor.          STOP taking these medications    cholecalciferol 25 MCG (1000 UNIT) tablet Commonly known as: VITAMIN D3 Stopped by: Scarlette Shorts, MD   Jardiance 25 MG Tabs tablet Generic drug: empagliflozin Stopped by: Scarlette Shorts, MD   metroNIDAZOLE 500 MG tablet Commonly known as: FLAGYL Stopped by: Scarlette Shorts, MD       TAKE these medications    clobetasol ointment 0.05 % Commonly known as: TEMOVATE Apply 1 Application topically 2 (two) times daily. Use for 2 weeks at a time for a flare.  You may use the cream twice a week at bedtime for maintenance dosing.   FISH OIL PO Take by mouth daily.   losartan-hydrochlorothiazide 50-12.5 MG tablet Commonly known as: HYZAAR losartan 50 mg-hydrochlorothiazide 12.5 mg tablet  TAKE 1 TABLET BY MOUTH EVERY DAY FOR 90 DAYS   metFORMIN 500 MG 24 hr tablet Commonly known as: GLUCOPHAGE-XR Take 100 mg by mouth at bedtime.   nystatin ointment Commonly known as: MYCOSTATIN Apply 1 Application topically 2 (two) times daily. Apply to affected area for up to 7 days.   Trospium Chloride 60 MG Cp24 Take 1 capsule (60 mg total) by mouth daily.   valACYclovir 1000 MG tablet Commonly known as: VALTREX Take 2 Tablets By Mouth Every 12 Hours for 24 hours as needed for an outbreak.   venlafaxine 37.5 MG tablet Commonly known as: EFFEXOR Take 37.5 mg by mouth daily at 6 (six) AM.         ALLERGIES: Allergies  Allergen Reactions   Tizanidine Hcl Other (See Comments)    Cant move Cant move   Clarithromycin Nausea Only    Severe stomach cramps Severe stomach cramps     REVIEW OF SYSTEMS: A comprehensive ROS was conducted with the patient and is negative except as per HPI and below:  Review of  Systems  Gastrointestinal:  Positive for nausea. Negative for abdominal pain and vomiting.      OBJECTIVE:   VITAL SIGNS: BP 124/82 (BP Location: Left Arm, Patient Position: Sitting, Cuff Size: Large)   Pulse 88   Ht 5\' 1"  (1.549 m)   Wt 195 lb 12.8 oz (88.8 kg)   SpO2 97%   BMI 37.00 kg/m    PHYSICAL EXAM:  General: Pt appears well and is in NAD  Neck: General: Supple without adenopathy or carotid bruits.  Thyroid: Thyroid size normal.  No goiter or nodules appreciated.   Lungs: Clear with good BS bilat with no rales, rhonchi, or wheezes  Heart: RRR   Abdomen:  soft, nontender  Extremities:  Lower extremities - No pretibial edema.   Neuro: MS is good with appropriate affect, pt is alert and Ox3    DATA REVIEWED:  Lab Results  Component Value Date   HGBA1C 6.3 (H) 12/15/2019   HGBA1C 6.2 (H) 12/06/2018   HGBA1C 6.0 07/30/2018    Latest Reference Range & Units 01/16/22 07:30  Sodium 135 - 145 mEq/L 135  Potassium 3.5 - 5.1 mEq/L 4.1  Chloride 96 - 112 mEq/L 96  CO2 19 - 32 mEq/L 29  Glucose 70 - 99 mg/dL 161175 (H)  BUN 6 - 23 mg/dL 14  Creatinine 0.960.40 - 0.451.20 mg/dL 4.090.84  Calcium 8.4 - 81.110.5 mg/dL 9.3  Alkaline Phosphatase 39 - 117 U/L 88  Albumin 3.5 - 5.2 g/dL 4.1  Lipase 91.411.0 - 78.259.0 U/L 14.0  AST 0 - 37 U/L 38 (H)  ALT 0 - 35 U/L 45 (H)  Total Protein 6.0 - 8.3 g/dL 7.2  Total Bilirubin 0.2 - 1.2 mg/dL 0.5  GFR >95.62>60.00 mL/min 77.92  (H): Data is abnormally high   Thyroid Ultrasound 01/20/2022  Estimated total number of nodules >/= 1 cm: 2   Number of spongiform nodules >/=  2 cm not described below (TR1): 0   Number of mixed cystic and solid nodules >/= 1.5 cm not described below (TR2): 0   _________________________________________________________   Nodule labeled 1, inferior right thyroid, 2.8 cm. Nodule is unchanged and has undergone prior biopsy. Assuming benign result, no further specific follow-up would be indicated.   Nodule labeled 2 inferior left  thyroid, 1.9 cm. This has TR 3 characteristics and meets criteria for surveillance.   No adenopathy   IMPRESSION: Left inferior thyroid nodule (labeled 2, 1.9 cm, TR 3) meets criteria for surveillance, as designated by the newly established ACR TI-RADS criteria. Surveillance ultrasound study recommended to be performed annually up to 5 years.     FNA right mid 07/07/2021 Clinical History: Rt. Mid thyroid nodule 2.5 x 2.8 x 2.1 cm  Specimen Submitted:  A. THYROID, RIGHT LOBE, FINE NEEDLE ASPIRATION:    FINAL MICROSCOPIC DIAGNOSIS:  - Atypia of undetermined significance (Bethesda category III)    ASSESSMENT / PLAN / RECOMMENDATIONS:   1) Type 2 Diabetes Mellitus, Optimally controlled, Without complications - Most recent A1c of 6.0 %. Goal A1c < 7.0 %.    -A1c at goal, I have encouraged the patient to continue with lifestyle changes and limiting CHO intake -She has been limited in exercise due to plantar fasciitis -Patient with recurrent vaginitis, we will stop Jardiance -I have encouraged the patient to take her metformin before supper rather than bedtime  MEDICATIONS: Stop Jardiance Take metformin 500 mg XR 2 tabs before supper  EDUCATION / INSTRUCTIONS: BG monitoring instructions: Patient is instructed to check her blood sugars 1 times a day, fasting. Call [place] if: BG persistently < 70  I reviewed the Rule of 15 for the treatment of hypoglycemia in detail with the patient. Literature supplied.   2) Diabetic complications:  Eye: Does not have known diabetic retinopathy.  Neuro/ Feet: Does not have known diabetic peripheral neuropathy. Renal: Patient does not have known baseline CKD.    3) MNG   -She endorses occasional dysphagia.  I am not sure if this has anything to  do with her multinodular goiter, we opted to proceed with barium swallow - S/P FNA of the 2.8 cm right nodule with Atypia . Afirma benign per pt ( record not available )  -We  discussed repeating thyroid ultrasound by August 2024  3. Dysphagia :  -We will proceed with barium swallow -We discussed that if her dysphagia is related to multinodular goiter, and total thyroidectomy would be recommended, I also explained that she will require lifelong LT-for replacement following total thyroidectomy   Follow-up in 4 months   Signed electronically by: Lyndle Herrlich, MD  [facility]  [facility] [address] [place], [place] [address] Phone: [phone] FAX: [phone]   CC: Fatima Sanger, FNP [address] [place] [address] Phone: [phone]  Fax: [phone]    Return to Endocrinology clinic as below: Future Appointments  Date Time Provider Department Center  05/08/2022  1:20 PM [facility] MR 3 [id number] [address] [facility]

## 2022-05-05 NOTE — Patient Instructions (Signed)
Stop Jardiance  Take Metformin 500 mg , 2 tablets before Supper      Exercise ( brisk walking ) 150 minutes a week

## 2022-05-08 ENCOUNTER — Ambulatory Visit
Admission: RE | Admit: 2022-05-08 | Discharge: 2022-05-08 | Disposition: A | Discharge status: Home / Self Care | Payer: BC Managed Care – PPO | Source: Ambulatory Visit | Attending: Registered Nurse | Admitting: Registered Nurse

## 2022-05-08 DIAGNOSIS — D7389 Other diseases of spleen: Secondary | ICD-10-CM

## 2022-05-08 MED ORDER — GADOPICLENOL 0.5 MMOL/ML IV SOLN
9.0000 mL | Freq: Once | INTRAVENOUS | Status: AC | PRN
  Administered 2022-05-08: 9 mL via INTRAVENOUS

## 2022-05-15 ENCOUNTER — Other Ambulatory Visit: Payer: BC Managed Care – PPO

## 2022-05-15 ENCOUNTER — Encounter: Payer: Self-pay | Admitting: Obstetrics and Gynecology

## 2022-05-15 ENCOUNTER — Telehealth: Payer: Self-pay | Admitting: Physician Assistant

## 2022-05-15 NOTE — Telephone Encounter (Signed)
Scheduled appointment per referral. Patient is aware of appointment date and time. Patient is aware to arrive 15 mins prior to appointment time and to bring updated insurance cards. Patient is aware of location.   

## 2022-05-19 ENCOUNTER — Inpatient Hospital Stay: Payer: BC Managed Care – PPO

## 2022-05-19 ENCOUNTER — Inpatient Hospital Stay: Payer: BC Managed Care – PPO | Attending: Physician Assistant | Admitting: Physician Assistant

## 2022-05-19 ENCOUNTER — Encounter: Payer: Self-pay | Admitting: Physician Assistant

## 2022-05-19 VITALS — BP 125/83 | HR 87 | Temp 97.9°F | Resp 20 | Wt 195.4 lb

## 2022-05-19 DIAGNOSIS — D7389 Other diseases of spleen: Secondary | ICD-10-CM

## 2022-05-19 DIAGNOSIS — Z800 Family history of malignant neoplasm of digestive organs: Secondary | ICD-10-CM | POA: Diagnosis not present

## 2022-05-19 DIAGNOSIS — E119 Type 2 diabetes mellitus without complications: Secondary | ICD-10-CM

## 2022-05-19 DIAGNOSIS — R110 Nausea: Secondary | ICD-10-CM

## 2022-05-19 LAB — CBC WITH DIFFERENTIAL (CANCER CENTER ONLY)
Abs Immature Granulocytes: 0.03 10*3/uL (ref 0.00–0.07)
Basophils Absolute: 0.1 10*3/uL (ref 0.0–0.1)
Basophils Relative: 1 %
Eosinophils Absolute: 0.3 10*3/uL (ref 0.0–0.5)
Eosinophils Relative: 4 %
HCT: 42.3 % (ref 36.0–46.0)
Hemoglobin: 14.5 g/dL (ref 12.0–15.0)
Immature Granulocytes: 0 %
Lymphocytes Relative: 18 %
Lymphs Abs: 1.7 10*3/uL (ref 0.7–4.0)
MCH: 30.3 pg (ref 26.0–34.0)
MCHC: 34.3 g/dL (ref 30.0–36.0)
MCV: 88.3 fL (ref 80.0–100.0)
Monocytes Absolute: 0.9 10*3/uL (ref 0.1–1.0)
Monocytes Relative: 10 %
Neutro Abs: 6.3 10*3/uL (ref 1.7–7.7)
Neutrophils Relative %: 67 %
Platelet Count: 357 10*3/uL (ref 150–400)
RBC: 4.79 MIL/uL (ref 3.87–5.11)
RDW: 13.2 % (ref 11.5–15.5)
WBC Count: 9.5 10*3/uL (ref 4.0–10.5)
nRBC: 0 % (ref 0.0–0.2)

## 2022-05-19 LAB — CMP (CANCER CENTER ONLY)
ALT: 34 U/L (ref 0–44)
AST: 31 U/L (ref 15–41)
Albumin: 4.6 g/dL (ref 3.5–5.0)
Alkaline Phosphatase: 79 U/L (ref 38–126)
Anion gap: 8 (ref 5–15)
BUN: 15 mg/dL (ref 6–20)
CO2: 29 mmol/L (ref 22–32)
Calcium: 10.5 mg/dL — ABNORMAL HIGH (ref 8.9–10.3)
Chloride: 101 mmol/L (ref 98–111)
Creatinine: 0.75 mg/dL (ref 0.44–1.00)
GFR, Estimated: >60 mL/min (ref >60)
Glucose, Bld: 94 mg/dL (ref 70–99)
Potassium: 3.7 mmol/L (ref 3.5–5.1)
Sodium: 138 mmol/L (ref 135–145)
Total Bilirubin: 0.4 mg/dL (ref 0.3–1.2)
Total Protein: 7.8 g/dL (ref 6.5–8.1)

## 2022-05-19 LAB — C-REACTIVE PROTEIN: CRP: 1.4 mg/dL — ABNORMAL HIGH (ref <1.0)

## 2022-05-19 LAB — LACTATE DEHYDROGENASE: LDH: 205 U/L — ABNORMAL HIGH (ref 98–192)

## 2022-05-19 LAB — SEDIMENTATION RATE: Sed Rate: 11 mm/hr (ref 0–22)

## 2022-05-20 DIAGNOSIS — D7389 Other diseases of spleen: Secondary | ICD-10-CM

## 2022-05-20 HISTORY — DX: Other diseases of spleen: D73.89

## 2022-05-20 MED ORDER — ONDANSETRON HCL 8 MG PO TABS: 8.0000 mg | ORAL_TABLET | Freq: Three times a day (TID) | ORAL | 0 refills | Status: DC | PRN

## 2022-05-20 NOTE — Progress Notes (Signed)
[place] Telephone:([phone]   Fax:([phone]) [phone]  INITIAL CONSULTATION:  Patient Care Team: Holland Commons, FNP as PCP - General (Internal Medicine)  CHIEF COMPLAINTS/PURPOSE OF CONSULTATION:  Splenic lesions  HISTORY OF PRESENTING ILLNESS:  [name] 56 y.o. female with medical history significant for diabetes, fatty liver, hyperlipidemia, and subclinical hypothyroidism presents to the diagnostic clinic for evaluation of splenic lesions. She is unaccompanied for this visit.   On review of the previous records, [name] underwent CT imaging of the abdomen/pelvis on 01/18/2022 due to nausea, abdominal pain and bloating. Findings revealed numerous small rounded low attenuation lesions in the spleen. Subsequent imaging with MRI abdomen was obtained on 05/08/2022. Findings revealed innumerable scatter small splenic lesions.   On exam today, [name] reports persistent nausea that has not improved over the last several months. She is not taking any antiemetics at this time. Her energy levels are stable and she is able to complete all her ADLs on her own. She has a good appetite and denies any noticeable weight loss. She does report proximal dysphagia and is scheduled for a barium swallow on 05/22/2022. She continues to have a dull pain involving the left mid back. She does not require any pain medication at this time. She denies fevers, chills, sweats, shortness of breath, chest pain or cough. She has no other complaints. Rest of the 10 point ROS is below.   MEDICAL HISTORY:  Past Medical History:  Diagnosis Date   Anxiety    Back pain    Constipation    Diabetes mellitus without complication (HCC)    Fatty liver    Hypercholesteremia    Intestinal bacterial overgrowth    Joint pain    Kidney problem    Kidney stones    Lichen planus 2595   vulva   Migraines    Nausea in adult    S/P breast biopsy 04/06/2021   right breast  biopsy   Shortness of breath    Stress    Subclinical hypothyroidism 09/2009    SURGICAL HISTORY: Past Surgical History:  Procedure Laterality Date   COLONOSCOPY     LITHOTRIPSY     NOVASURE ABLATION  12/18/2006   RADIOACTIVE SEED GUIDED EXCISIONAL BREAST BIOPSY Right 05/30/2021   Procedure: RADIOACTIVE SEED GUIDED EXCISIONAL RIGHT BREAST BIOPSY;  Surgeon: Rolm Bookbinder, MD;  Location: [facility];  Service: General;  Laterality: Right;   ROTATOR CUFF REPAIR Right 06/20/2007    SOCIAL HISTORY: Social History   Socioeconomic History   Marital status: Married    Spouse name: Nicole Kindred   Number of children: 2   Years of education: Not on file   Highest education level: Not on file  Occupational History   Occupation: Nature conservation officer   Occupation: Investment banker, operational  Tobacco Use   Smoking status: Never   Smokeless tobacco: Never  Vaping Use   Vaping Use: Never used  Substance and Sexual Activity   Alcohol use: No    Alcohol/week: 0.0 standard drinks of alcohol   Drug use: No   Sexual activity: Yes    Partners: Male    Birth control/protection: Surgical    Comment: Ablation, Vasectomy  Other Topics Concern   Not on file  Social History Narrative   Not on file   Social Determinants of Health   Financial Resource Strain: Not on file  Food Insecurity: Not on file  Transportation Needs: Not on file  Physical  Activity: Not on file  Stress: Not on file  Social Connections: Not on file  Intimate Partner Violence: Not on file    FAMILY HISTORY: Family History  Problem Relation Age of Onset   Diabetes Mother    Hyperlipidemia Mother    Thyroid disease Mother    Anxiety disorder Mother    Obesity Mother    Aortic stenosis Mother    Colon cancer Paternal Grandmother    Heart disease Paternal Grandfather    Pancreatic cancer Neg Hx    Stomach cancer Neg Hx    Liver disease Neg Hx    Esophageal cancer Neg Hx      ALLERGIES:  is allergic to tizanidine hcl and clarithromycin.  MEDICATIONS:  Current Outpatient Medications  Medication Sig Dispense Refill   cetirizine (ZYRTEC) 10 MG tablet Take 10 mg by mouth at bedtime as needed.     clobetasol ointment (TEMOVATE) 5.36 % Apply 1 Application topically 2 (two) times daily. Use for 2 weeks at a time for a flare.  You may use the cream twice a week at bedtime for maintenance dosing. 60 g 1   fluticasone (FLONASE) 50 MCG/ACT nasal spray Place 1 spray into both nostrils daily.     losartan-hydrochlorothiazide (HYZAAR) 50-12.5 MG tablet losartan 50 mg-hydrochlorothiazide 12.5 mg tablet  TAKE 1 TABLET BY MOUTH EVERY DAY FOR 90 DAYS     metFORMIN (GLUCOPHAGE-XR) 500 MG 24 hr tablet Take 2 tablets (1,000 mg total) by mouth daily before supper. 180 tablet 3   nystatin ointment (MYCOSTATIN) Apply 1 Application topically 2 (two) times daily. Apply to affected area for up to 7 days. 30 g 0   Omega-3 Fatty Acids (FISH OIL PO) Take by mouth daily.     Trospium Chloride 60 MG CP24 Take 1 capsule (60 mg total) by mouth daily. 90 capsule 1   valACYclovir (VALTREX) 1000 MG tablet Take 2 Tablets By Mouth Every 12 Hours for 24 hours as needed for an outbreak. 30 tablet 1   venlafaxine (EFFEXOR) 37.5 MG tablet Take 37.5 mg by mouth daily at 6 (six) AM.     No current facility-administered medications for this visit.    REVIEW OF SYSTEMS:   Constitutional: ( - ) fevers, ( - )  chills , ( - ) night sweats Eyes: ( - ) blurriness of vision, ( - ) double vision, ( - ) watery eyes Ears, nose, mouth, throat, and face: ( - ) mucositis, ( - ) sore throat Respiratory: ( - ) cough, ( - ) dyspnea, ( - ) wheezes Cardiovascular: ( - ) palpitation, ( - ) chest discomfort, ( - ) lower extremity swelling Gastrointestinal:  ( + ) nausea, ( - ) heartburn, ( - ) change in bowel habits Skin: ( - ) abnormal skin rashes Lymphatics: ( - ) new lymphadenopathy, ( - ) easy  bruising Neurological: ( - ) numbness, ( - ) tingling, ( - ) new weaknesses Behavioral/Psych: ( - ) mood change, ( - ) new changes  All other systems were reviewed with the patient and are negative.  PHYSICAL EXAMINATION: ECOG PERFORMANCE STATUS: 1 - Symptomatic but completely ambulatory  Vitals:   05/19/22 1419  BP: 125/83  Pulse: 87  Resp: 20  Temp: 97.9 F (36.6 C)  SpO2: 96%   Filed Weights   05/19/22 1419  Weight: 195 lb 6.4 oz (88.6 kg)    GENERAL: well appearing female in NAD  SKIN: skin color, texture, turgor are normal, no  rashes or significant lesions EYES: conjunctiva are pink and non-injected, sclera clear OROPHARYNX: no exudate, no erythema; lips, buccal mucosa, and tongue normal  NECK: supple, non-tender LYMPH:  no palpable lymphadenopathy in the cervical, axillary or supraclavicular lymph nodes.  LUNGS: clear to auscultation and percussion with normal breathing effort HEART: regular rate & rhythm and no murmurs and no lower extremity edema ABDOMEN: soft, non-tender, non-distended, normal bowel sounds Musculoskeletal: no cyanosis of digits and no clubbing  PSYCH: alert & oriented x 3, fluent speech NEURO: no focal motor/sensory deficits  LABORATORY DATA:  I have reviewed the data as listed    Latest Ref Rng & Units 05/19/2022    3:27 PM 01/16/2022    7:30 AM 12/15/2019    4:05 PM  CBC  WBC 4.0 - 10.5 K/uL 9.5  9.9  9.4   Hemoglobin 12.0 - 15.0 g/dL 14.5  13.3  14.5   Hematocrit 36.0 - 46.0 % 42.3  38.9  41.8   Platelets 150 - 400 K/uL 357  303.0  319        Latest Ref Rng & Units 05/19/2022    3:27 PM 01/16/2022    7:30 AM 03/23/2021    2:47 PM  CMP  Glucose 70 - 99 mg/dL 94  175    BUN 6 - 20 mg/dL 15  14    Creatinine 0.44 - 1.00 mg/dL 0.75  0.84    Sodium 135 - 145 mmol/L 138  135    Potassium 3.5 - 5.1 mmol/L 3.7  4.1    Chloride 98 - 111 mmol/L 101  96    CO2 22 - 32 mmol/L 29  29    Calcium 8.9 - 10.3 mg/dL 10.5  9.3    Total Protein 6.5 -  8.1 g/dL 7.8  7.2  7.1   Total Bilirubin 0.3 - 1.2 mg/dL 0.4  0.5  0.3   Alkaline Phos 38 - 126 U/L 79  88  94   AST 15 - 41 U/L 31  38  25   ALT 0 - 44 U/L 34  45  30      RADIOGRAPHIC STUDIES: I have personally reviewed the radiological images as listed and agreed with the findings in the report. MR ABDOMEN WWO CONTRAST  Result Date: 05/09/2022 CLINICAL DATA:  Indeterminate splenic lesions on CT scan 01/18/2022 EXAM: MRI ABDOMEN WITHOUT AND WITH CONTRAST TECHNIQUE: Multiplanar multisequence MR imaging of the abdomen was performed both before and after the administration of intravenous contrast. CONTRAST:  9 cc Vueway COMPARISON:  Multiple exams, including 01/18/2022 FINDINGS: Lower chest: Unremarkable Hepatobiliary: Hepatic steatosis with dropout in hepatic parenchymal signal on out-of-phase images. No significant focal liver lesion. Pancreas:  Unremarkable Spleen: Innumerable scattered small splenic lesions generally under 1 cm in diameter, many of which are targetoid on T2 weighted images with peripheral low T2 signal and central T2 signal similar to that of the rest of the spleen. These lesions in general are higher than background splenic activity on the T1 weighted images and demonstrate progressive enhancement and delayed enhancement. On the 3 minute images, many of the lesions are isointense to the rest of the spleen. Differential diagnostic considerations include hemangiomatosis, lymphangiomatosis, hemangioendotheliomas, angiosarcoma, metastatic disease, lymphoma, sarcoidosis, or littoral cell angioma. Adrenals/Urinary Tract:  Unremarkable Stomach/Bowel: Unremarkable Vascular/Lymphatic:  No overtly pathologic adenopathy. Other:  No supplemental non-categorized findings. Musculoskeletal: Unremarkable IMPRESSION: 1. Innumerable scattered small splenic lesions, targetoid appearance on T2 weighted images and progressive enhancement on delayed images. Differential diagnostic  considerations include  hemangiomatosis, lymphangiomatosis, hemangioendotheliomas, angiosarcoma, metastatic disease, lymphoma, sarcoidosis, or littoral cell angioma. Frequently tissue diagnosis is necessary for differentiation. 2. Hepatic steatosis. Electronically Signed   By: Van Clines M.D.   On: 05/09/2022 11:25   CT CARDIAC SCORING ([facility] LOCATIONS ONLY)  Result Date: 04/29/2022 CLINICAL DATA:  Screening.  Elevated cholesterol. * Tracking Code: [place] * EXAM: CT CARDIAC CORONARY ARTERY CALCIUM SCORE TECHNIQUE: Non-contrast imaging through the heart was performed using prospective ECG gating. Image post processing was performed on an independent workstation, allowing for quantitative analysis of the heart and coronary arteries. Note that this exam targets the heart and the chest was not imaged in its entirety. COMPARISON:  04/26/2021 diagnostic chest CT. FINDINGS: CORONARY CALCIUM SCORES: Total Agatston Score: 0- No coronary calcium identified. AORTA MEASUREMENTS: Ascending Aorta: 3.0 cm Descending Aorta: 2.1 cm OTHER FINDINGS: Cardiovascular: Normal aortic caliber. Normal heart size, without pericardial effusion. Mediastinum/Nodes: No imaged thoracic adenopathy. Prominent infrahilar nodal tissue again identified as on 06/08/2020 cardiac CTA. Lungs/Pleura: No pleural fluid.  Left major fissure thickening. Left upper lobe 5 mm pulmonary nodule on 18/6 is similar to on the prior and considered benign. There is also a subpleural right middle lobe 4 mm pulmonary nodule which is similar to on the prior and considered benign. Upper Abdomen: Mild hepatic steatosis. Normal imaged portions of the spleen, stomach. Musculoskeletal: No acute osseous abnormality. Mild convex right thoracic spine curvature. IMPRESSION: 1. No coronary calcium identified. 2. Hepatic steatosis. 3. Stable bilateral pulmonary nodules since 2022, considered benign . In the absence of clinically indicated signs/symptoms require(s) no independent follow-up.  Electronically Signed   By: Abigail Miyamoto M.D.   On: 04/29/2022 14:44    ASSESSMENT & PLAN IEISHA GAO is a 56 y.o. female who presents to the diagnostic clinic for evaluation for splenic lesions. We reviewed possible etiologies and our recommended workup. Patient will proceed with laboratory evaluation followed by PET/CT imaging to further evaluate the splenic lesions and to identify other targetable lesions for biopsy.  #Splenic lesions: --Labs today to check CBC, CMP, ESR, CRP, LDH, flow cytometry.  --Ordered PET scan today.  -- If there is evidence of FDG avid splenic lesions, we will request referral to general surgery for splenectomy. If there are other targetable lesions, we will consider biopsy. --RTC once workup is complete  #Nausea: --Etiology unknown, under the care of gastroenterology --Sent prescription for zofran.   Orders Placed This Encounter  Procedures   NM PET Image Initial (PI) Skull Base To Thigh    Standing Status:   Future    Standing Expiration Date:   05/19/2023    Order Specific Question:   If indicated for the ordered procedure, I authorize the administration of a radiopharmaceutical per Radiology protocol    Answer:   Yes    Order Specific Question:   Is the patient pregnant?    Answer:   No    Order Specific Question:   Preferred imaging location?    Answer:   [facility]   CBC with Differential (Cancer Center Only)    Standing Status:   Future    Number of Occurrences:   1    Standing Expiration Date:   05/20/2023   CMP ([facility] only)    Standing Status:   Future    Number of Occurrences:   1    Standing Expiration Date:   05/20/2023   Lactate dehydrogenase (LDH)    Standing Status:   Future  Number of Occurrences:   1    Standing Expiration Date:   05/19/2023   Flow Cytometry, Peripheral Blood (Oncology)    Standing Status:   Future    Number of Occurrences:   1    Standing Expiration Date:   05/20/2023   Sedimentation rate    Standing  Status:   Future    Number of Occurrences:   1    Standing Expiration Date:   05/19/2023   C-reactive protein    Standing Status:   Future    Number of Occurrences:   1    Standing Expiration Date:   05/19/2023    All questions were answered. The patient knows to call the clinic with any problems, questions or concerns.  I have spent a total of 60 minutes minutes of face-to-face and non-face-to-face time, preparing to see the patient, obtaining and/or reviewing separately obtained history, performing a medically appropriate examination, counseling and educating the patient, ordering medications/tests/procedures, documenting clinical information in the electronic health record,  and care coordination.   Dede Query, PA-C Department of Hematology/Oncology [place] at [facility] Phone: [phone]  Patient was seen with Dr. Lorenso Courier  I have read the above note and personally examined the patient. I agree with the assessment and plan as noted above.  Briefly Mrs. Adellyn Capek is a 56 year old female with medical history significant for splenic lesions who presents for evaluation.  The patient underwent a CT scan of the abdomen on 01/18/2022 due to nausea and bloating and was found to have numerous splenic lesions.  Subsequent MRI of the abdomen on 05/08/2022 showed innumerable lesions within the spleen but no other clear abnormalities.  Due to concern for these findings she was referred to our service for further evaluation and management.  At this time I recommend performing a PET CT scan.  PET avid lesions within the spleen have a high probability of representing malignancy.  Additionally we will order LDH, CBC, CMP, and flow cytometry in order to rule out a lymphomatous process.  The patient voiced understanding of our workup moving forward.   Ledell Peoples, MD Department of Hematology/Oncology [place] at [facility] Phone:  [phone] Pager: [phone] Email: [email or web address]

## 2022-05-22 ENCOUNTER — Ambulatory Visit
Admission: RE | Admit: 2022-05-22 | Discharge: 2022-05-22 | Disposition: A | Discharge status: Home / Self Care | Payer: BC Managed Care – PPO | Source: Ambulatory Visit | Attending: Internal Medicine | Admitting: Internal Medicine

## 2022-05-22 ENCOUNTER — Other Ambulatory Visit: Payer: Self-pay

## 2022-05-22 ENCOUNTER — Other Ambulatory Visit: Payer: Self-pay | Admitting: Internal Medicine

## 2022-05-22 ENCOUNTER — Inpatient Hospital Stay: Payer: BC Managed Care – PPO

## 2022-05-22 DIAGNOSIS — R1310 Dysphagia, unspecified: Secondary | ICD-10-CM

## 2022-05-22 DIAGNOSIS — D7389 Other diseases of spleen: Secondary | ICD-10-CM

## 2022-05-22 DIAGNOSIS — E042 Nontoxic multinodular goiter: Secondary | ICD-10-CM

## 2022-05-22 DIAGNOSIS — E119 Type 2 diabetes mellitus without complications: Secondary | ICD-10-CM

## 2022-05-23 LAB — FLOW CYTOMETRY

## 2022-05-23 LAB — SURGICAL PATHOLOGY

## 2022-05-24 ENCOUNTER — Encounter: Payer: BC Managed Care – PPO | Admitting: Obstetrics and Gynecology

## 2022-05-24 ENCOUNTER — Ambulatory Visit (INDEPENDENT_AMBULATORY_CARE_PROVIDER_SITE_OTHER): Payer: BC Managed Care – PPO | Admitting: Obstetrics and Gynecology

## 2022-05-24 ENCOUNTER — Other Ambulatory Visit: Payer: BC Managed Care – PPO

## 2022-05-24 ENCOUNTER — Encounter (HOSPITAL_COMMUNITY)
Admission: RE | Admit: 2022-05-24 | Discharge: 2022-05-24 | Disposition: A | Discharge status: Home / Self Care | Payer: BC Managed Care – PPO | Source: Ambulatory Visit | Attending: Physician Assistant | Admitting: Physician Assistant

## 2022-05-24 ENCOUNTER — Encounter: Payer: Self-pay | Admitting: Obstetrics and Gynecology

## 2022-05-24 VITALS — BP 137/81 | HR 92

## 2022-05-24 DIAGNOSIS — N393 Stress incontinence (female) (male): Secondary | ICD-10-CM | POA: Diagnosis not present

## 2022-05-24 DIAGNOSIS — D7389 Other diseases of spleen: Secondary | ICD-10-CM | POA: Insufficient documentation

## 2022-05-24 LAB — GLUCOSE, CAPILLARY: Glucose-Capillary: 107 mg/dL — ABNORMAL HIGH (ref 70–99)

## 2022-05-24 MED ORDER — FLUDEOXYGLUCOSE F - 18 (FDG) INJECTION
10.0000 | Freq: Once | INTRAVENOUS | Status: AC | PRN
  Administered 2022-05-24: 9.7 via INTRAVENOUS

## 2022-05-24 NOTE — Progress Notes (Unsigned)
[facility] Pre-Operative visit  Subjective Chief Complaint: [name] presents for a preoperative encounter.   History of Present Illness: [name] is a 56 y.o. female who presents for preoperative visit.  She is scheduled to undergo midurethral sling, cystoscopy on 06/05/22.  Her symptoms include stress incontinence.   She also has urgency incontinence and has started on trospium 60mg  ER daily.   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   Migraines    Nausea in adult    S/P breast biopsy 04/06/2021   right breast biopsy   Shortness of breath    Stress    Subclinical hypothyroidism 09/2009     Past Surgical History:  Procedure Laterality Date   COLONOSCOPY     LITHOTRIPSY     NOVASURE ABLATION  12/18/2006   RADIOACTIVE SEED GUIDED EXCISIONAL BREAST BIOPSY Right 05/30/2021   Procedure: RADIOACTIVE SEED GUIDED EXCISIONAL RIGHT BREAST BIOPSY;  Surgeon: 14/05/2021, MD;  [facility]  [facility];  Service: General;  Laterality: Right;   ROTATOR CUFF REPAIR Right 06/20/2007    is allergic to tizanidine hcl and clarithromycin.   Family History  Problem Relation Age of Onset   Diabetes Mother    Hyperlipidemia Mother    Thyroid disease Mother    Anxiety disorder Mother    Obesity Mother    Aortic stenosis Mother    Colon cancer Paternal Grandmother    Heart disease Paternal Grandfather    Pancreatic cancer Neg Hx    Stomach cancer Neg Hx    Liver disease Neg Hx    Esophageal cancer Neg Hx     Social History   Tobacco Use   Smoking status: Never   Smokeless tobacco: Never  Vaping Use   Vaping Use: Never used  Substance Use Topics   Alcohol use: No    Alcohol/week: 0.0 standard  drinks of alcohol   Drug use: No     Review of Systems was negative for a full 10 system review except as noted in the History of Present Illness.   Current Outpatient Medications:    cetirizine (ZYRTEC) 10 MG tablet, Take 10 mg by mouth at bedtime as needed., Disp: , Rfl:    clobetasol ointment (TEMOVATE) 0.05 %, Apply 1 Application topically 2 (two) times daily. Use for 2 weeks at a time for a flare.  You may use the cream twice a week at bedtime for maintenance dosing., Disp: 60 g, Rfl: 1   fluticasone (FLONASE) 50 MCG/ACT nasal spray, Place 1 spray into both nostrils daily., Disp: , Rfl:    losartan-hydrochlorothiazide (HYZAAR) 50-12.5 MG tablet, losartan 50 mg-hydrochlorothiazide 12.5 mg tablet  TAKE 1 TABLET BY MOUTH EVERY DAY FOR 90 DAYS, Disp: , Rfl:    metFORMIN (GLUCOPHAGE-XR) 500 MG 24 hr tablet, Take 2 tablets (1,000 mg total) by mouth daily before supper., Disp: 180 tablet, Rfl: 3   nystatin ointment (MYCOSTATIN), Apply 1 Application topically 2 (two) times daily. Apply to affected area for up to 7 days., Disp: 30 g, Rfl: 0   Omega-3 Fatty Acids (FISH OIL PO), Take by mouth daily., Disp: , Rfl:  ondansetron (ZOFRAN) 8 MG tablet, Take 1 tablet (8 mg total) by mouth every 8 (eight) hours as needed for nausea or vomiting., Disp: 60 tablet, Rfl: 0   Trospium Chloride 60 MG CP24, Take 1 capsule (60 mg total) by mouth daily., Disp: 90 capsule, Rfl: 1   valACYclovir (VALTREX) 1000 MG tablet, Take 2 Tablets By Mouth Every 12 Hours for 24 hours as needed for an outbreak., Disp: 30 tablet, Rfl: 1   venlafaxine (EFFEXOR) 37.5 MG tablet, Take 37.5 mg by mouth daily at 6 (six) AM., Disp: , Rfl:    Objective Vitals:   05/24/22 1356  BP: 137/81  Pulse: 92    Gen: NAD CV: S1 S2 RRR Lungs: Clear to auscultation bilaterally Abd: soft, nontender   Previous Pelvic Exam showed: CST: positive- large volume   Speculum exam reveals normal vaginal mucosa without atrophy. Cervix normal  appearance. Uterus normal single, nontender. Adnexa no mass, fullness, tenderness.     POP-Q   -3                                            Aa   -3                                           Ba   -5.5                                              C    2.5                                            Gh   3.5                                            Pb   10                                            tvl    -3                                            Ap   -3                                            Bp   -9.5                                              D  Assessment/ Plan  Assessment: The patient is a 56 y.o. year old scheduled to undergo midurethral sling, cystoscopy. Verbal consent was obtained for these procedures.  Plan: General Surgical Consent: The patient has previously been counseled on alternative treatments, and the decision by the patient and provider was to proceed with the procedure listed above.  For all procedures, there are risks of bleeding, infection, damage to surrounding organs including but not limited to bowel, bladder, blood vessels, ureters and nerves, and need for further surgery if an injury were to occur. These risks are all low with minimally invasive surgery.   There are risks of numbness and weakness at any body site or buttock/rectal pain.  It is possible that baseline pain can be worsened by surgery, either with or without mesh. If surgery is vaginal, there is also a low risk of possible conversion to laparoscopy or open abdominal incision where indicated. Very rare risks include blood transfusion, blood clot, heart attack, pneumonia, or death.   There is also a risk of short-term postoperative urinary retention with need to use a catheter. About half of patients need to go home from surgery with a catheter, which is then later removed in the office. The risk of long-term need for a catheter is very low. There is also a risk of  worsening of overactive bladder.   Sling: The effectiveness of a midurethral vaginal mesh sling is approximately 85%, and thus, there will be times when you may leak urine after surgery, especially if your bladder is full or if you have a strong cough. There is a balance between making the sling tight enough to treat your leakage but not too tight so that you have long-term difficulty emptying your bladder. A mesh sling will not directly treat overactive bladder/urge incontinence and may worsen it.  There is an FDA safety notification on vaginal mesh procedures for prolapse but NOT mesh slings. We have extensive experience and training with mesh placement and we have close postoperative follow up to identify any potential complications from mesh. It is important to realize that this mesh is a permanent implant that cannot be easily removed. There are rare risks of mesh exposure (2-4%), pain with intercourse (0-7%), and infection (<1%). The risk of mesh exposure if more likely in a woman with risks for poor healing (prior radiation, poorly controlled diabetes, or immunocompromised). The risk of new or worsened chronic pain after mesh implant is more common in women with baseline chronic pain and/or poorly controlled anxiety or depression. Approximately 2-4% of patients will experience longer-term post-operative voiding dysfunction that may require surgical revision of the sling. We also reviewed that postoperatively, her stream may not be as strong as before surgery.    We discussed consent for blood products. Risks for blood transfusion include allergic reactions, other reactions that can affect different body organs and managed accordingly, transmission of infectious diseases such as HIV or Hepatitis. However, the blood is screened. Patient consents for blood products.  Pre-operative instructions:  She was instructed to not take Aspirin/NSAIDs x 7days prior to surgery.  Antibiotic prophylaxis was ordered as  indicated.  Catheter use: Patient will go home with foley if needed after post-operative voiding trial.  Post-operative instructions:  She was provided with specific post-operative instructions, including precautions and signs/symptoms for which we would recommend contacting us, in addition to daytime and after-hours contact phone numbers. This was provided on a handout.   Post-operative medications: Prescriptions for motrin, tylenol, miralax, and oxycodone were sent to  her pharmacy. Discussed using ibuprofen and tylenol on a schedule to limit use of narcotics.   Laboratory testing:  No labs needed  Preoperative clearance:  She does not require surgical clearance.    Post-operative follow-up:  A post-operative appointment will be made for 6 weeks from the date of surgery. If she needs a post-operative nurse visit for a voiding trial, that will be set up after she leaves the hospital.    Patient will call the clinic or use MyChart should anything change or any new issues arise.   Marguerita BeardsMichelle N Henryk Ursin, MD  Time spent: I spent 20 minutes dedicated to the care of this patient on the date of this encounter to include pre-visit review of records, face-to-face time with the patient discussing surgery and post visit documentation and ordering medication/ testing.

## 2022-05-25 ENCOUNTER — Encounter: Payer: Self-pay | Admitting: Obstetrics and Gynecology

## 2022-05-25 MED ORDER — OXYCODONE HCL 5 MG PO TABS: 5.0000 mg | ORAL_TABLET | ORAL | 0 refills | Status: DC | PRN

## 2022-05-25 MED ORDER — ACETAMINOPHEN 500 MG PO TABS: 500.0000 mg | ORAL_TABLET | Freq: Four times a day (QID) | ORAL | 0 refills | Status: DC | PRN

## 2022-05-25 MED ORDER — POLYETHYLENE GLYCOL 3350 17 GM/SCOOP PO POWD: 17.0000 g | Freq: Every day | ORAL | 0 refills | Status: DC

## 2022-05-25 MED ORDER — IBUPROFEN 600 MG PO TABS: 600.0000 mg | ORAL_TABLET | Freq: Four times a day (QID) | ORAL | 0 refills | Status: DC | PRN

## 2022-05-25 NOTE — H&P (Signed)
[facility] Pre-Operative H&P  Subjective Chief Complaint: DOUA LEAR presents for a preoperative encounter.   History of Present Illness: TIMEA KOZISEK is a 56 y.o. female who presents for preoperative visit.  She is scheduled to undergo midurethral sling, cystoscopy on 06/05/22.  Her symptoms include stress incontinence.   She also has urgency incontinence and has started on trospium 60mg  ER daily.   Past Medical History:  Diagnosis Date   Anxiety    Back pain    Constipation    Diabetes mellitus without complication (HCC)    Fatty liver    Hypercholesteremia    Intestinal bacterial overgrowth    Joint pain    Kidney problem    Kidney stones    Lichen planus [id number]   vulva   Migraines    Nausea in adult    S/P breast biopsy 04/06/2021   right breast biopsy   Shortness of breath    Stress    Subclinical hypothyroidism 09/2009     Past Surgical History:  Procedure Laterality Date   COLONOSCOPY     LITHOTRIPSY     NOVASURE ABLATION  12/18/2006   RADIOACTIVE SEED GUIDED EXCISIONAL BREAST BIOPSY Right 05/30/2021   Procedure: RADIOACTIVE SEED GUIDED EXCISIONAL RIGHT BREAST BIOPSY;  Surgeon: Rolm Bookbinder, MD;  Location: [facility];  Service: General;  Laterality: Right;   ROTATOR CUFF REPAIR Right 06/20/2007    is allergic to tizanidine hcl and clarithromycin.   Family History  Problem Relation Age of Onset   Diabetes Mother    Hyperlipidemia Mother    Thyroid disease Mother    Anxiety disorder Mother    Obesity Mother    Aortic stenosis Mother    Colon cancer Paternal Grandmother    Heart disease Paternal Grandfather    Pancreatic cancer Neg Hx    Stomach cancer Neg Hx    Liver disease Neg Hx    Esophageal cancer Neg Hx     Social History   Tobacco Use   Smoking status: Never   Smokeless tobacco: Never  Vaping Use   Vaping Use: Never used  Substance Use Topics   Alcohol use: No    Alcohol/week: 0.0 standard  drinks of alcohol   Drug use: No     Review of Systems was negative for a full 10 system review except as noted in the History of Present Illness.  No current facility-administered medications for this encounter.  Current Outpatient Medications:    acetaminophen (TYLENOL) 500 MG tablet, Take 1 tablet (500 mg total) by mouth every 6 (six) hours as needed (pain)., Disp: 30 tablet, Rfl: 0   cetirizine (ZYRTEC) 10 MG tablet, Take 10 mg by mouth at bedtime as needed., Disp: , Rfl:    clobetasol ointment (TEMOVATE) [id number] %, Apply 1 Application topically 2 (two) times daily. Use for 2 weeks at a time for a flare.  You may use the cream twice a week at bedtime for maintenance dosing., Disp: 60 g, Rfl: 1   fluticasone (FLONASE) 50 MCG/ACT nasal spray, Place 1 spray into both nostrils daily., Disp: , Rfl:    ibuprofen (ADVIL) 600 MG tablet, Take 1 tablet (600 mg total) by mouth every 6 (six) hours as needed., Disp: 30 tablet, Rfl: 0   losartan-hydrochlorothiazide (HYZAAR) 50-12.5 MG tablet, losartan 50 mg-hydrochlorothiazide 12.5 mg tablet  TAKE 1 TABLET BY MOUTH EVERY DAY FOR 90 DAYS, Disp: , Rfl:    metFORMIN (GLUCOPHAGE-XR) 500 MG 24 hr tablet, Take  2 tablets (1,000 mg total) by mouth daily before supper., Disp: 180 tablet, Rfl: 3   nystatin ointment (MYCOSTATIN), Apply 1 Application topically 2 (two) times daily. Apply to affected area for up to 7 days., Disp: 30 g, Rfl: 0   Omega-3 Fatty Acids (FISH OIL PO), Take by mouth daily., Disp: , Rfl:    ondansetron (ZOFRAN) 8 MG tablet, Take 1 tablet (8 mg total) by mouth every 8 (eight) hours as needed for nausea or vomiting., Disp: 60 tablet, Rfl: 0   oxyCODONE (OXY IR/ROXICODONE) 5 MG immediate release tablet, Take 1 tablet (5 mg total) by mouth every 4 (four) hours as needed for severe pain., Disp: 5 tablet, Rfl: 0   polyethylene glycol powder (GLYCOLAX/MIRALAX) 17 GM/SCOOP powder, Take 17 g by mouth daily. Drink 17g (1 scoop) dissolved in water per day.,  Disp: 255 g, Rfl: 0   Trospium Chloride 60 MG CP24, Take 1 capsule (60 mg total) by mouth daily., Disp: 90 capsule, Rfl: 1   valACYclovir (VALTREX) 1000 MG tablet, Take 2 Tablets By Mouth Every 12 Hours for 24 hours as needed for an outbreak., Disp: 30 tablet, Rfl: 1   venlafaxine (EFFEXOR) 37.5 MG tablet, Take 37.5 mg by mouth daily at 6 (six) AM., Disp: , Rfl:    Objective There were no vitals filed for this visit.   Gen: NAD CV: S1 S2 RRR Lungs: Clear to auscultation bilaterally Abd: soft, nontender   Previous Pelvic Exam showed: CST: positive- large volume   Speculum exam reveals normal vaginal mucosa without atrophy. Cervix normal appearance. Uterus normal single, nontender. Adnexa no mass, fullness, tenderness.     POP-Q   -3                                            Aa   -3                                           Ba   -5.5                                              C    2.5                                            Gh   3.5                                            Pb   10                                            tvl    -3  Ap   -3                                            Bp   -9.5                                              D           Assessment/ Plan  The patient is a 56 y.o. year old scheduled to undergo midurethral sling, cystoscopy.   Jaquita Folds, MD

## 2022-05-26 ENCOUNTER — Telehealth: Payer: Self-pay | Admitting: Physician Assistant

## 2022-05-26 ENCOUNTER — Telehealth: Payer: Self-pay

## 2022-05-26 DIAGNOSIS — D7389 Other diseases of spleen: Secondary | ICD-10-CM

## 2022-05-26 DIAGNOSIS — R591 Generalized enlarged lymph nodes: Secondary | ICD-10-CM

## 2022-05-26 NOTE — Telephone Encounter (Signed)
Please send STAT referral to [facility] [place] [facility] for excisional biopsy IT please send my notes and PET scan results   STAT referral and records faxed and confirmation received.

## 2022-05-26 NOTE — Telephone Encounter (Signed)
I called Ms. [name] to review the PET/CT scan 05/24/2022. There is evidence of diffuse hypermetabolic lymphadenopathy along with hypermetabolic hepatic and splenic lesions. Differentials include inflammatory process such as sarcoidosis versus  lymphoproliferative process. Recommend excisional biopsy to further evaluate. We will make a referral to general surgery for excisional biopsy of right inguinal lymph node. Patient expressed understanding of the plan provided.

## 2022-06-01 ENCOUNTER — Encounter (HOSPITAL_BASED_OUTPATIENT_CLINIC_OR_DEPARTMENT_OTHER): Payer: Self-pay | Admitting: Obstetrics and Gynecology

## 2022-06-01 NOTE — Progress Notes (Addendum)
Spoke w/ via phone for pre-op interview--- Fletcher Anon needs dos---- ISTAT, EKG, CBG            Lab results------ COVID test -----patient states asymptomatic no test needed Arrive at -------1300 NPO after MN NO Solid Food.  Clear liquids from MN until---1200 Med rec completed Medications to take morning of surgery -----Effexor Diabetic medication -----NONE AM of surgery Patient instructed no nail polish to be worn day of surgery Patient instructed to bring photo id and insurance card day of surgery Patient aware to have Driver (ride ) / caregiver  Son Sameera Betton  for 24 hours after surgery  Patient Special Instructions ----- Pre-Op special Istructions ----- Patient verbalized understanding of instructions that were given at this phone interview. Patient denies shortness of breath, chest pain, fever, cough at this phone interview.

## 2022-06-01 NOTE — Telephone Encounter (Signed)
call from [facility]/Dr Oneida suggested we refer her to IR to see if they can see her sooner. They do not have any openings   Pt advised. She said she would go any where and would prefer to have this removed than just a BX. Even [facility] if possible.   okay, why don't we request a referral to [facility] team,General surgery IT Specifically request Dr. Andee Lineman   STAT referral faxed and confirmation received.

## 2022-06-02 NOTE — Progress Notes (Signed)
Patient notified to come in at 1100 on 06/05/2022.

## 2022-06-05 ENCOUNTER — Other Ambulatory Visit: Payer: Self-pay

## 2022-06-05 ENCOUNTER — Ambulatory Visit (HOSPITAL_BASED_OUTPATIENT_CLINIC_OR_DEPARTMENT_OTHER)
Admission: RE | Admit: 2022-06-05 | Discharge: 2022-06-05 | Disposition: A | Discharge status: Home / Self Care | Payer: BC Managed Care – PPO | Attending: Obstetrics and Gynecology | Admitting: Obstetrics and Gynecology

## 2022-06-05 ENCOUNTER — Ambulatory Visit (HOSPITAL_BASED_OUTPATIENT_CLINIC_OR_DEPARTMENT_OTHER): Payer: BC Managed Care – PPO | Admitting: Certified Registered Nurse Anesthetist

## 2022-06-05 ENCOUNTER — Encounter (HOSPITAL_BASED_OUTPATIENT_CLINIC_OR_DEPARTMENT_OTHER): Payer: Self-pay | Admitting: Obstetrics and Gynecology

## 2022-06-05 ENCOUNTER — Telehealth: Payer: Self-pay | Admitting: Obstetrics and Gynecology

## 2022-06-05 ENCOUNTER — Encounter (HOSPITAL_BASED_OUTPATIENT_CLINIC_OR_DEPARTMENT_OTHER)
Admission: RE | Disposition: A | Discharge status: Home / Self Care | Payer: Self-pay | Source: Home / Self Care | Attending: Obstetrics and Gynecology

## 2022-06-05 DIAGNOSIS — E119 Type 2 diabetes mellitus without complications: Secondary | ICD-10-CM | POA: Diagnosis not present

## 2022-06-05 DIAGNOSIS — G4730 Sleep apnea, unspecified: Secondary | ICD-10-CM | POA: Insufficient documentation

## 2022-06-05 DIAGNOSIS — N393 Stress incontinence (female) (male): Secondary | ICD-10-CM

## 2022-06-05 DIAGNOSIS — Z9889 Other specified postprocedural states: Secondary | ICD-10-CM

## 2022-06-05 DIAGNOSIS — I10 Essential (primary) hypertension: Secondary | ICD-10-CM | POA: Insufficient documentation

## 2022-06-05 HISTORY — DX: Essential (primary) hypertension: I10

## 2022-06-05 HISTORY — PX: CYSTOSCOPY: SHX5120

## 2022-06-05 HISTORY — PX: BLADDER SUSPENSION: SHX72

## 2022-06-05 LAB — POCT I-STAT, CHEM 8
BUN: 9 mg/dL (ref 6–20)
Calcium, Ion: 1.18 mmol/L (ref 1.15–1.40)
Chloride: 100 mmol/L (ref 98–111)
Creatinine, Ser: 0.6 mg/dL (ref 0.44–1.00)
Glucose, Bld: 106 mg/dL — ABNORMAL HIGH (ref 70–99)
HCT: 42 % (ref 36.0–46.0)
Hemoglobin: 14.3 g/dL (ref 12.0–15.0)
Potassium: 3.9 mmol/L (ref 3.5–5.1)
Sodium: 140 mmol/L (ref 135–145)
TCO2: 28 mmol/L (ref 22–32)

## 2022-06-05 LAB — GLUCOSE, CAPILLARY: Glucose-Capillary: 118 mg/dL — ABNORMAL HIGH (ref 70–99)

## 2022-06-05 SURGERY — URETHROPEXY, USING TRANSVAGINAL TAPE
Anesthesia: General | Site: Vagina

## 2022-06-05 MED ORDER — SODIUM CHLORIDE 0.9 % IR SOLN
Status: DC | PRN
  Administered 2022-06-05: 1000 mL via INTRAVESICAL

## 2022-06-05 MED ORDER — ACETAMINOPHEN 500 MG PO TABS
ORAL_TABLET | ORAL | Status: AC
  Filled 2022-06-05: qty 2

## 2022-06-05 MED ORDER — MIDAZOLAM HCL 2 MG/2ML IJ SOLN
INTRAMUSCULAR | Status: AC
  Filled 2022-06-05: qty 2

## 2022-06-05 MED ORDER — PROPOFOL 10 MG/ML IV BOLUS
INTRAVENOUS | Status: AC
  Filled 2022-06-05: qty 20

## 2022-06-05 MED ORDER — LIDOCAINE-EPINEPHRINE 1 %-1:100000 IJ SOLN
INTRAMUSCULAR | Status: DC | PRN
  Administered 2022-06-05: 10 mL

## 2022-06-05 MED ORDER — ACETAMINOPHEN 500 MG PO TABS
1000.0000 mg | ORAL_TABLET | ORAL | Status: AC
  Administered 2022-06-05: 1000 mg via ORAL

## 2022-06-05 MED ORDER — PROPOFOL 10 MG/ML IV BOLUS
INTRAVENOUS | Status: DC | PRN
  Administered 2022-06-05: 160 mg via INTRAVENOUS
  Administered 2022-06-05: 20 mg via INTRAVENOUS

## 2022-06-05 MED ORDER — PHENAZOPYRIDINE HCL 100 MG PO TABS
ORAL_TABLET | ORAL | Status: AC
  Filled 2022-06-05: qty 2

## 2022-06-05 MED ORDER — POVIDONE-IODINE 10 % EX SWAB: 2.0000 | Freq: Once | CUTANEOUS | Status: DC

## 2022-06-05 MED ORDER — DEXAMETHASONE SODIUM PHOSPHATE 10 MG/ML IJ SOLN
INTRAMUSCULAR | Status: DC | PRN
  Administered 2022-06-05: 10 mg via INTRAVENOUS

## 2022-06-05 MED ORDER — LACTATED RINGERS IV SOLN: INTRAVENOUS | Status: DC

## 2022-06-05 MED ORDER — ONDANSETRON HCL 4 MG/2ML IJ SOLN
INTRAMUSCULAR | Status: DC | PRN
  Administered 2022-06-05: 4 mg via INTRAVENOUS

## 2022-06-05 MED ORDER — HYDROMORPHONE HCL 1 MG/ML IJ SOLN: 0.2500 mg | INTRAMUSCULAR | Status: DC | PRN

## 2022-06-05 MED ORDER — MIDAZOLAM HCL 2 MG/2ML IJ SOLN
INTRAMUSCULAR | Status: DC | PRN
  Administered 2022-06-05: 2 mg via INTRAVENOUS

## 2022-06-05 MED ORDER — FENTANYL CITRATE (PF) 250 MCG/5ML IJ SOLN
INTRAMUSCULAR | Status: DC | PRN
  Administered 2022-06-05: 50 ug via INTRAVENOUS
  Administered 2022-06-05 (×2): 25 ug via INTRAVENOUS

## 2022-06-05 MED ORDER — ARTIFICIAL TEARS OPHTHALMIC OINT
TOPICAL_OINTMENT | OPHTHALMIC | Status: AC
  Filled 2022-06-05: qty 7

## 2022-06-05 MED ORDER — LIDOCAINE 2% (20 MG/ML) 5 ML SYRINGE
INTRAMUSCULAR | Status: DC | PRN
  Administered 2022-06-05: 100 mg via INTRAVENOUS

## 2022-06-05 MED ORDER — PHENAZOPYRIDINE HCL 100 MG PO TABS
200.0000 mg | ORAL_TABLET | ORAL | Status: AC
  Administered 2022-06-05: 200 mg via ORAL

## 2022-06-05 MED ORDER — 0.9 % SODIUM CHLORIDE (POUR BTL) OPTIME
TOPICAL | Status: DC | PRN
  Administered 2022-06-05: 500 mL

## 2022-06-05 MED ORDER — CEFAZOLIN SODIUM-DEXTROSE 2-4 GM/100ML-% IV SOLN
2.0000 g | INTRAVENOUS | Status: AC
  Administered 2022-06-05: 2 g via INTRAVENOUS

## 2022-06-05 MED ORDER — CEFAZOLIN SODIUM-DEXTROSE 2-4 GM/100ML-% IV SOLN
INTRAVENOUS | Status: AC
  Filled 2022-06-05: qty 100

## 2022-06-05 MED ORDER — FENTANYL CITRATE (PF) 100 MCG/2ML IJ SOLN
INTRAMUSCULAR | Status: AC
  Filled 2022-06-05: qty 2

## 2022-06-05 SURGICAL SUPPLY — 34 items
ADH SKN CLS APL DERMABOND .7 (GAUZE/BANDAGES/DRESSINGS) ×2
AGENT HMST KT MTR STRL THRMB (HEMOSTASIS)
BLADE CLIPPER SENSICLIP SURGIC (BLADE) ×2 IMPLANT
BLADE SURG 15 STRL LF DISP TIS (BLADE) ×2 IMPLANT
BLADE SURG 15 STRL SS (BLADE) ×2
DERMABOND ADVANCED .7 DNX12 (GAUZE/BANDAGES/DRESSINGS) ×2 IMPLANT
ELECT REM PT RETURN 9FT ADLT (ELECTROSURGICAL)
ELECTRODE REM PT RTRN 9FT ADLT (ELECTROSURGICAL) IMPLANT
GAUZE 4X4 16PLY ~~LOC~~+RFID DBL (SPONGE) IMPLANT
GLOVE BIOGEL PI IND STRL 6.5 (GLOVE) ×2 IMPLANT
GLOVE ECLIPSE 6.0 STRL STRAW (GLOVE) ×2 IMPLANT
GOWN STRL REUS W/TWL LRG LVL3 (GOWN DISPOSABLE) ×2 IMPLANT
HIBICLENS CHG 4% 4OZ BTL (MISCELLANEOUS) ×2 IMPLANT
HOLDER FOLEY CATH W/STRAP (MISCELLANEOUS) ×2 IMPLANT
KIT TURNOVER CYSTO (KITS) ×2 IMPLANT
MANIFOLD NEPTUNE II (INSTRUMENTS) ×2 IMPLANT
NEEDLE HYPO 22GX1.5 SAFETY (NEEDLE) ×2 IMPLANT
NS IRRIG 1000ML POUR BTL (IV SOLUTION) ×2 IMPLANT
PACK CYSTO (CUSTOM PROCEDURE TRAY) ×2 IMPLANT
PACK VAGINAL WOMENS (CUSTOM PROCEDURE TRAY) ×2 IMPLANT
RETRACTOR LONE STAR DISPOSABLE (INSTRUMENTS) ×2 IMPLANT
RETRACTOR STAY HOOK 5MM (MISCELLANEOUS) ×2 IMPLANT
SET IRRIG Y TYPE TUR BLADDER L (SET/KITS/TRAYS/PACK) ×2 IMPLANT
SPIKE FLUID TRANSFER (MISCELLANEOUS) ×2 IMPLANT
SUCTION FRAZIER HANDLE 10FR (MISCELLANEOUS) ×2
SUCTION TUBE FRAZIER 10FR DISP (MISCELLANEOUS) ×2 IMPLANT
SURGIFLO W/THROMBIN 8M KIT (HEMOSTASIS) IMPLANT
SUT ABS MONO DBL WITH NDL 48IN (SUTURE) IMPLANT
SUT VIC AB 2-0 SH 27 (SUTURE) ×2
SUT VIC AB 2-0 SH 27XBRD (SUTURE) ×2 IMPLANT
SYR BULB EAR ULCER 3OZ GRN STR (SYRINGE) ×2 IMPLANT
SYS SLING ADV FIT BLUE TRNSVAG (Sling) IMPLANT
TOWEL OR 17X26 10 PK STRL BLUE (TOWEL DISPOSABLE) ×2 IMPLANT
TRAY FOLEY W/BAG SLVR 14FR (SET/KITS/TRAYS/PACK) ×2 IMPLANT

## 2022-06-05 NOTE — Op Note (Signed)
Operative Note  Preoperative Diagnosis: stress urinary incontinence  Postoperative Diagnosis: same  Procedures performed:  Midurethral sling (Advantage Fit), cystoscopy  Implants:  Implant Name Type Inv. Item Serial No. Manufacturer Lot No. LRB No. Used Action  SYS SLING ADV FIT BLUE TRNSVAG - [id number] Sling SYS SLING ADV FIT BLUE TRNSVAG  [employer] [id number] N/A 1 Implanted    Attending Surgeon: Lanetta Inch, MD  Anesthesia: General LMA  Findings: On cystoscopy, normal bladder and urethra without injury, lesion or foreign body. Brisk bilateral ureteral efflux noted.    Specimens: none  Estimated blood loss: 15 mL  IV fluids: 450 mL  Urine output: see flowsheet  Complications: none  Procedure in Detail:  After informed consent was obtained, the patient was taken to the operating room where she was placed under anesthesia.  She was then placed in the dorsal lithotomy position with allen stirrups, taking care to avoid any nerve injury, and prepped and draped in the usual sterile fashion.  A strap was placed across her upper abdomen on top of her gown so it was not in direct contact with her skin.   A foley catheter was placed.  A lonestar self-retraining retractor was placed with 4 stay hooks. The midurethral area was located on the anterior vaginal wall.  Two Allis clamps were placed at the level of the midurethra. 1% lidocaine with epinephrine was injected into the vaginal mucosa. A vertical incision was made between the two clamps using a 15-blade scalpel.  Using sharp dissection, Metzenbaum scissors were used to make a periurethral tunnel from the vaginal incision towards the pubic rami bilaterally for the future sling tracts. The bladder was ensured to be empty. The trocar and attached sling were introduced into the right side of the periurethral vaginal incision, just inferior to the pubic symphysis on the right side. The trocar was guided through the endopelvic fascia  and directly vertically.  While hugging the cephalad surface of the pubic bone, the trocar was guided out through the abdomen 2 fingerbreadths lateral to midline at the level of the pubic symphysis on the ipsilateral side. The trocar was placed on the left side in a similar fashion.  A 70-degree cystoscope was introduced, and 360-degree inspection revealed no trauma or trocars in the bladder, with brisk bilateral ureteral efflux.  The bladder was drained and the cystoscope was removed.  The Foley catheter was reinserted.  The sling was brought to lie beneath the mid-urethra.  A needle driver was placed behind the sling to ensure no tension.   The plastic sheath was removed from the sling and the distal ends of the sling were trimmed just below the level of the skin incisions.  Tension-free positioning of the sling was confirmed. Vaginal inspection revealed no vaginotomy or sling perforations of the mucosa.  The vaginal mucosal edges were reapproximated using 2-0 Vicryl.  The vagina was copiously irrigated.  Hemostasis was again noted. Vaginal packing not placed. The suprapubic sling incisions were closed with Dermabond. The patient tolerated the procedure well.  She was awakened from anesthesia and transferred to the recovery room in stable condition. All needle and sponge counts were correct x 2.    Marguerita Beards, MD

## 2022-06-05 NOTE — Interval H&P Note (Signed)
History and Physical Interval Note:  06/05/2022 9:04 AM  [name]  has presented today for surgery, with the diagnosis of Stress urinary incontinence.  The various methods of treatment have been discussed with the patient and family. After consideration of risks, benefits and other options for treatment, the patient has consented to  Procedure(s) with comments: TRANSVAGINAL TAPE (TVT) PROCEDURE (N/A) CYSTOSCOPY (N/A) as a surgical intervention.  The patient's history has been reviewed, patient examined, no change in status, stable for surgery.  I have reviewed the patient's chart and labs.  Questions were answered to the patient's satisfaction.     Marguerita Beards

## 2022-06-05 NOTE — Telephone Encounter (Signed)
[name] underwent midurethral sling, cystoscopy on 06/05/22.   She failed her voiding trial.  was backfilled into the bladder Voided 35ml  PVR by bladder scan was 71ml (unclear if accurate).   She was discharged with a catheter. Please call her for a routine post op check and to schedule a voiding trial for Thursday or Friday this week. Thanks!  Marguerita Beards, MD

## 2022-06-05 NOTE — Anesthesia Procedure Notes (Signed)
Procedure Name: LMA Insertion Date/Time: 06/05/2022 9:42 AM  Performed by: Dairl Ponder, CRNAPre-anesthesia Checklist: Patient identified, Emergency Drugs available, Suction available and Patient being monitored Patient Re-evaluated:Patient Re-evaluated prior to induction Oxygen Delivery Method: Circle System Utilized Preoxygenation: Pre-oxygenation with 100% oxygen Induction Type: IV induction Ventilation: Mask ventilation without difficulty LMA: LMA inserted LMA Size: 4.0 Number of attempts: 1 Airway Equipment and Method: Bite block Placement Confirmation: positive ETCO2 Tube secured with: Tape Dental Injury: Teeth and Oropharynx as per pre-operative assessment

## 2022-06-05 NOTE — Anesthesia Postprocedure Evaluation (Signed)
Anesthesia Post Note  Patient: [name]  Procedure(s) Performed: TRANSVAGINAL TAPE (TVT) PROCEDURE (Vagina ) CYSTOSCOPY (Bladder)     Patient location during evaluation: PACU Anesthesia Type: General Pain management: pain level controlled Vital Signs Assessment: post-procedure vital signs reviewed and stable Respiratory status: spontaneous breathing Cardiovascular status: stable Postop Assessment: no apparent nausea or vomiting Anesthetic complications: no   No notable events documented.  Last Vitals:  Vitals:   06/05/22 1021 06/05/22 1022  BP: (!) 118/45   Pulse:  90  Resp:    Temp:    SpO2:  96%    Last Pain:  Vitals:   06/05/22 0852  TempSrc: Oral  PainSc: 0-No pain                 Charis Juliana

## 2022-06-05 NOTE — Progress Notes (Signed)
Informed Dr. Florian Buff that patient had 200 cc NS instilled into bladder and voided 75 cc then bladder scan revealed 59cc on first scan and 66 cc on second scan.  Insertion of Foley catheter needed per Dr. Florian Buff. Patient informed and wants to try to urinate on her own once more. Patient taken to BR to attempt to void again.

## 2022-06-05 NOTE — Transfer of Care (Signed)
Immediate Anesthesia Transfer of Care Note  Patient: [name]  Procedure(s) Performed: TRANSVAGINAL TAPE (TVT) PROCEDURE (Vagina ) CYSTOSCOPY (Bladder)  Patient Location: PACU  Anesthesia Type:General  Level of Consciousness: awake, alert , and oriented  Airway & Oxygen Therapy: Patient Spontanous Breathing  Post-op Assessment: Report given to RN and Post -op Vital signs reviewed and stable  Post vital signs: Reviewed and stable  Last Vitals:  Vitals Value Taken Time  BP    Temp    Pulse 90 06/05/22 1021  Resp    SpO2 96 % 06/05/22 1021  Vitals shown include unvalidated device data.  Last Pain:  Vitals:   06/05/22 0852  TempSrc: Oral  PainSc: 0-No pain      Patients Stated Pain Goal: 5 (06/05/22 7253)  Complications: No notable events documented.

## 2022-06-05 NOTE — Anesthesia Preprocedure Evaluation (Addendum)
Anesthesia Evaluation  Patient identified by MRN, date of birth, ID band Patient awake  General Assessment Comment:History noted Dr. Chilton Si  Reviewed: Allergy & Precautions, NPO status , Patient's Chart, lab work & pertinent test results  Airway Mallampati: II       Dental   Pulmonary shortness of breath, sleep apnea    breath sounds clear to auscultation       Cardiovascular hypertension,  Rhythm:Regular Rate:Normal     Neuro/Psych  Headaches PSYCHIATRIC DISORDERS         GI/Hepatic negative GI ROS, Neg liver ROS,,,  Endo/Other  diabetesHypothyroidism    Renal/GU Renal disease     Musculoskeletal   Abdominal   Peds  Hematology   Anesthesia Other Findings   Reproductive/Obstetrics                             Anesthesia Physical Anesthesia Plan  ASA: 3  Anesthesia Plan: General   Post-op Pain Management:    Induction: Intravenous  PONV Risk Score and Plan: Treatment may vary due to age or medical condition, Ondansetron, Dexamethasone and Midazolam  Airway Management Planned: LMA  Additional Equipment:   Intra-op Plan:   Post-operative Plan: Extubation in OR  Informed Consent:      Dental advisory given  Plan Discussed with: CRNA and Anesthesiologist  Anesthesia Plan Comments:        Anesthesia Quick Evaluation

## 2022-06-05 NOTE — Discharge Instructions (Addendum)
POST OPERATIVE INSTRUCTIONS  General Instructions Recovery (not bed rest) will last approximately 6 weeks Walking is encouraged, but refrain from strenuous exercise/ housework/ heavy lifting for 2 weeks. No lifting >10lbs  Nothing in the vagina- NO intercourse, tampons or douching for 6 weeks Bathing:  Do not submerge in water (NO swimming, bath, hot tub, etc) until after your postop visit. You can shower starting the day after surgery.  No driving until you are not taking narcotic pain medicine and until your pain is well enough controlled that you can slam on the breaks or make sudden movements if needed.   Taking your medications Please take your acetaminophen and ibuprofen on a schedule for the first 48 hours. Take '600mg'$  ibuprofen, then take '500mg'$  acetaminophen 3 hours later, then continue to alternate ibuprofen and acetaminophen. That way you are taking each type of medication every 6 hours. Take the prescribed narcotic (oxycodone, tramadol, etc) as needed, with a maximum being every 4 hours.  Take a stool softener daily to keep your stools soft and preventing you from straining. If you have diarrhea, you decrease your stool softener. This is explained more below. We have prescribed you Miralax.  Reasons to Call the Nurse (see last page for phone numbers) Heavy Bleeding (changing your pad every 1-2 hours) Persistent nausea/vomiting Fever (100.4 degrees or more) Incision problems (pus or other fluid coming out, redness, warmth, increased pain)  Things to Expect After Surgery Mild to Moderate pain is normal during the first day or two after surgery. If prescribed, take Ibuprofen or Tylenol first and use the stronger medicine for "break-through" pain. You can overlap these medicines because they work differently.   Constipation   To Prevent Constipation:  Eat a well-balanced diet including protein, grains, fresh fruit and vegetables.  Drink plenty of fluids. Walk regularly.  Depending on  specific instructions from your physician: take Miralax daily and additionally you can add a stool softener (colace/ docusate) and fiber supplement. Continue as long as you're on pain medications.   To Treat Constipation:  If you do not have a bowel movement in 2 days after surgery, you can take 2 Tbs of Milk of Magnesia 1-2 times a day until you have a bowel movement. If diarrhea occurs, decrease the amount or stop the laxative. If no results with Milk of Magnesia, you can drink a bottle of magnesium citrate which you can purchase over the counter.  Fatigue:  This is a normal response to surgery and will improve with time.  Plan frequent rest periods throughout the day.  Gas Pain:  This is very common but can also be very painful! Drink warm liquids such as herbal teas, bouillon or soup. Walking will help you pass more gas.  Mylicon or Gas-X can be taken over the counter.  Leaking Urine:  Varying amounts of leakage may occur after surgery.  This should improve with time. Your bladder needs at least 3 months to recover from surgery. If you leak after surgery, be sure to mention this to your doctor at your post-op visit. If you were taking medications for overactive bladder prior to surgery, be sure to restart the medications immediately after surgery.  Incisions: If you have incisions on your abdomen, the skin glue will dissolve on its own over time. It is ok to gently rinse with soap and water over these incisions but do not scrub.  Catheter Approximately 50% of patients are unable to urinate after surgery and need to go home with a  catheter. This allows your bladder to rest so it can return to full function. If you go home with a catheter, the office will call to set up a voiding trial a few days after surgery. For most patients, by this visit, they are able to urinate on their own. Long term catheter use is rare.   Return to Work  As work demands and recovery times vary widely, it is hard to  predict when you will want to return to work. If you have a desk job with no strenuous physical activity, and if you would like to return sooner than generally recommended, discuss this with your provider or call our office.   Post op concerns  For non-emergent issues, please call the Urogynecology Nurse. Please leave a message and someone will contact you within one business day.  You can also send a message through MyChart.   AFTER HOURS (After 5:00 PM and on weekends):  For urgent matters that cannot wait until the next business day. Call our office [phone] and connect to the doctor on call.  Please reserve this for important issues.   **FOR ANY TRUE EMERGENCY ISSUES CALL 911 OR GO TO THE NEAREST EMERGENCY ROOM.** Please inform our office or the doctor on call of any emergency.     APPOINTMENTS: Call [phone]         No acetaminophen/Tylenol until after 3:15 pm today if needed.   Post Anesthesia Home Care Instructions  Activity: Get plenty of rest for the remainder of the day. A responsible individual must stay with you for 24 hours following the procedure.  For the next 24 hours, DO NOT: -Drive a car -Advertising copywriter -Drink alcoholic beverages -Take any medication unless instructed by your physician -Make any legal decisions or sign important papers.  Meals: Start with liquid foods such as gelatin or soup. Progress to regular foods as tolerated. Avoid greasy, spicy, heavy foods. If nausea and/or vomiting occur, drink only clear liquids until the nausea and/or vomiting subsides. Call your physician if vomiting continues.  Special Instructions/Symptoms: Your throat may feel dry or sore from the anesthesia or the breathing tube placed in your throat during surgery. If this causes discomfort, gargle with warm salt water. The discomfort should disappear within 24 hours.

## 2022-06-06 ENCOUNTER — Encounter (HOSPITAL_BASED_OUTPATIENT_CLINIC_OR_DEPARTMENT_OTHER): Payer: Self-pay | Admitting: Obstetrics and Gynecology

## 2022-06-06 NOTE — Telephone Encounter (Signed)
Called and spoke to patient to follow up on surgery: She reports she is doing well. States her pain in controlled. Denies bleeding and has had a BM. She will be seen on Thursday 12/21 for a voiding trial.

## 2022-06-07 ENCOUNTER — Encounter: Payer: Self-pay | Admitting: Physician Assistant

## 2022-06-08 ENCOUNTER — Ambulatory Visit (INDEPENDENT_AMBULATORY_CARE_PROVIDER_SITE_OTHER): Payer: BC Managed Care – PPO

## 2022-06-08 DIAGNOSIS — Z09 Encounter for follow-up examination after completed treatment for conditions other than malignant neoplasm: Secondary | ICD-10-CM

## 2022-06-08 NOTE — Patient Instructions (Signed)
Please keep all upcoming post op appointments

## 2022-06-08 NOTE — Progress Notes (Signed)
Urogyn Nurse Voiding Trial Note  [name] underwent TRANSVAGINAL TAPE (TVT) PROCEDURE and Cystoscopy on 06/05/22.  She presents today for a voiding trial .   Patient was identified with 2 indicators. of NS was instilled into the bladder via the catheter. The catheter was removed and patient was instructed to void into the urinary hat. She voided . The post void residual measured by bladder scan was 89ml. She passed the voiding trial and a catheter was not replaced.   The patient received aftercare instructions and will follow up as scheduled.

## 2022-06-13 DIAGNOSIS — R591 Generalized enlarged lymph nodes: Secondary | ICD-10-CM

## 2022-06-13 HISTORY — DX: Generalized enlarged lymph nodes: R59.1

## 2022-07-06 ENCOUNTER — Telehealth: Payer: Self-pay | Admitting: Physician Assistant

## 2022-07-06 DIAGNOSIS — D7389 Other diseases of spleen: Secondary | ICD-10-CM

## 2022-07-06 DIAGNOSIS — R591 Generalized enlarged lymph nodes: Secondary | ICD-10-CM

## 2022-07-06 NOTE — Telephone Encounter (Signed)
I called Ms. [name] to review the results from the excisional biopsy of the right groin lymph node performed by Dr. Karleen Hampshire on 06/30/2022 at [facility]. Findings were negative for malignancy and showed non-necrotizing granulomatous lymphadenitis.   Discussed referral to rheumatology to evaluate for possible inflammatory process. We do not recommend repeat biopsy at this time unless rheumatology workup is negative and lymphadenopathy worsens.  Patient expressed understanding of the plan provided.

## 2022-07-13 ENCOUNTER — Encounter: Payer: Self-pay | Admitting: Physician Assistant

## 2022-07-18 ENCOUNTER — Ambulatory Visit (INDEPENDENT_AMBULATORY_CARE_PROVIDER_SITE_OTHER): Payer: BC Managed Care – PPO | Admitting: Obstetrics and Gynecology

## 2022-07-18 ENCOUNTER — Encounter: Payer: Self-pay | Admitting: Obstetrics and Gynecology

## 2022-07-18 VITALS — BP 122/82 | HR 74

## 2022-07-18 DIAGNOSIS — N3281 Overactive bladder: Secondary | ICD-10-CM

## 2022-07-18 DIAGNOSIS — Z9889 Other specified postprocedural states: Secondary | ICD-10-CM

## 2022-07-18 DIAGNOSIS — N393 Stress incontinence (female) (male): Secondary | ICD-10-CM

## 2022-07-18 MED ORDER — ESTRADIOL 0.1 MG/GM VA CREA: TOPICAL_CREAM | VAGINAL | 11 refills | Status: DC

## 2022-07-18 NOTE — Progress Notes (Signed)
[facility]  Date of Visit: 07/18/2022  History of Present Illness: [name] is a 57 y.o. female scheduled today for a post-operative visit.   Surgery: s/p midurethral sling, cystoscopy on 06/05/22  She did not pass her postoperative void trial. Returned to office on 06/08/22 and passed repeat voiding trial at that time.  Postoperative course has been uncomplicated.   Today she reports she does not have any leakage. She is also on trospium 60mg  and that has been working well for her. She would like to continue with the medication.   UTI in the last 6 weeks? No  Pain? No  She has returned to her normal activity (except for postop restrictions) Vaginal bulge? No  Stress incontinence: No  Urgency/frequency: No  Urge incontinence: No  Voiding dysfunction: No  Bowel issues: No    Medications: She has a current medication list which includes the following prescription(s): clobetasol ointment, [START ON 07/20/2022] estradiol, losartan-hydrochlorothiazide, metformin, nystatin ointment, omega-3 fatty acids, ondansetron, trospium chloride, valacyclovir, and venlafaxine.   Allergies: Patient is allergic to tizanidine hcl and clarithromycin.   Physical Exam: BP 122/82   Pulse 74    Suprapubic Incisions: healing well.  Pelvic Examination: Vagina: pinpoint area in incision with spot of blood present, otherwise well healed. On palpation, feels as if one fiber of the mesh is palpable in the pinpoint area.   POP-Q: deferred  ---------------------------------------------------------  Assessment and Plan:  1. Post-operative state   2. SUI (stress urinary incontinence, female)   3. Overactive bladder     - Small pinpoint non healing area at incision with fiber of mesh. Prescribed estrace cream 0.5g in suburethral area nightly and will reevaluate.  - Continue pelvic rest but can do normal activity or exercise.  - Continue trospium 60mg  ER daily  Return 3 weeks  Jaquita Folds, MD

## 2022-08-02 ENCOUNTER — Ambulatory Visit: Payer: BC Managed Care – PPO | Admitting: Internal Medicine

## 2022-08-07 ENCOUNTER — Encounter: Payer: Self-pay | Admitting: *Deleted

## 2022-08-08 ENCOUNTER — Ambulatory Visit (INDEPENDENT_AMBULATORY_CARE_PROVIDER_SITE_OTHER): Payer: BC Managed Care – PPO | Admitting: Obstetrics and Gynecology

## 2022-08-08 ENCOUNTER — Other Ambulatory Visit (HOSPITAL_COMMUNITY)
Admission: RE | Admit: 2022-08-08 | Discharge: 2022-08-08 | Disposition: A | Discharge status: Home / Self Care | Payer: BC Managed Care – PPO | Source: Ambulatory Visit | Attending: Obstetrics and Gynecology | Admitting: Obstetrics and Gynecology

## 2022-08-08 ENCOUNTER — Encounter: Payer: Self-pay | Admitting: Obstetrics and Gynecology

## 2022-08-08 VITALS — BP 126/88 | HR 102

## 2022-08-08 DIAGNOSIS — B379 Candidiasis, unspecified: Secondary | ICD-10-CM | POA: Insufficient documentation

## 2022-08-08 DIAGNOSIS — N3281 Overactive bladder: Secondary | ICD-10-CM

## 2022-08-08 MED ORDER — TROSPIUM CHLORIDE ER 60 MG PO CP24: 1.0000 | ORAL_CAPSULE | Freq: Every day | ORAL | 3 refills | Status: DC

## 2022-08-08 NOTE — Progress Notes (Signed)
[facility]  Date of Visit: 08/08/2022  History of Present Illness: Ms. Molter is a 57 y.o. female scheduled today for a post-operative visit.   Surgery: s/p midurethral sling, cystoscopy on 06/05/22   At last visit, small fiber of mesh was noted in suburethral area. She was prescribed vaginal estrogen and has been using it. She has not had any leakage with cough/ sneeze. She had one episode of leakage when she could not get to the bathroom since last visit. She has been taking the tropsium and overall it has improved her symptoms.   Has noticed some vaginal itching. Has used two doses of diflucan.   Medications: She has a current medication list which includes the following prescription(s): clobetasol ointment, estradiol, losartan-hydrochlorothiazide, metformin, nystatin ointment, omega-3 fatty acids, ondansetron, valacyclovir, and trospium chloride.   Allergies: Patient is allergic to tizanidine hcl and clarithromycin.   Physical Exam: BP 126/88   Pulse (!) 102    Suprapubic Incisions: healing well.  Pelvic Examination: Vagina: normal mucosa. In suburethral space, no evidence of visible or palpable mesh. Aptima swab obtained  POP-Q: deferred  ---------------------------------------------------------  Assessment and Plan:  1. Overactive bladder   2. Yeast infection     - Continue with estrace cream twice weekly - Can resume intercourse or other activity if desired,  - Continue trospium 47m ER daily, refill provided - Aptima swab obtained due to itching and discharge  Follow up 1 year or sooner if needed  MJaquita Folds MD

## 2022-08-09 LAB — CERVICOVAGINAL ANCILLARY ONLY
Candida Glabrata: NEGATIVE
Candida Vaginitis: NEGATIVE
Comment: NEGATIVE
Comment: NEGATIVE

## 2022-08-11 LAB — HM HEPATITIS C SCREENING LAB: HM Hepatitis Screen: NEGATIVE

## 2022-08-15 ENCOUNTER — Other Ambulatory Visit: Payer: Self-pay | Admitting: Rheumatology

## 2022-08-15 DIAGNOSIS — D8689 Sarcoidosis of other sites: Secondary | ICD-10-CM

## 2022-08-28 ENCOUNTER — Ambulatory Visit
Admission: RE | Admit: 2022-08-28 | Discharge: 2022-08-28 | Disposition: A | Discharge status: Home / Self Care | Payer: BC Managed Care – PPO | Source: Ambulatory Visit | Attending: Rheumatology | Admitting: Rheumatology

## 2022-08-28 DIAGNOSIS — D8689 Sarcoidosis of other sites: Secondary | ICD-10-CM

## 2022-09-01 ENCOUNTER — Encounter: Payer: Self-pay | Admitting: Rheumatology

## 2022-09-07 ENCOUNTER — Other Ambulatory Visit: Payer: Self-pay | Admitting: Rheumatology

## 2022-09-07 DIAGNOSIS — D8689 Sarcoidosis of other sites: Secondary | ICD-10-CM

## 2022-09-13 ENCOUNTER — Ambulatory Visit
Admission: RE | Admit: 2022-09-13 | Discharge: 2022-09-13 | Disposition: A | Discharge status: Home / Self Care | Payer: BC Managed Care – PPO | Source: Ambulatory Visit | Attending: Rheumatology | Admitting: Rheumatology

## 2022-09-13 DIAGNOSIS — D8689 Sarcoidosis of other sites: Secondary | ICD-10-CM

## 2022-09-18 ENCOUNTER — Encounter: Payer: Self-pay | Admitting: Physician Assistant

## 2022-09-18 IMAGING — CT CT HEART MORP W/ CTA COR W/ SCORE W/ CA W/CM &/OR W/O CM
3 of 7 series · 8 of 20 positions shown, 9 images · IV contrast (APPLIED)
Comparison: 12/17/2018
COMPARISON: 12/17/2018

Addendum:
EXAM:
OVER-READ INTERPRETATION  CT CHEST

The following report is an over-read performed by radiologist Dr.
over-read does not include interpretation of cardiac or coronary
anatomy or pathology. The coronary CTA interpretation by the
cardiologist is attached.
CLINICAL DATA: Chest pain
Cardiac/Coronary CTA
TECHNIQUE: The patient was scanned on a Phillips Force scanner. A 120 kV
retrospective scan was triggered in the descending thoracic aorta at
111 HU's. Axial non-contrast 3 mm slices were carried out through
the heart. The data set was analyzed on a dedicated work station and
scored using the Agatson method. Gantry rotation speed was 250 msecs
and collimation was .6 mm. No beta blockade and 0.8 mg of sl NTG was
given. The 3D data set was reconstructed in 5% intervals of the
0-95% of the R-R cycle. Diastolic phases were analyzed on a
dedicated work station using MPR, MIP and VRT modes. The patient
received 80 cc of contrast.

[Series 6: best diast 71 % · axial · 0.35mm/px · z∈[+27,+63]mm · 2 of 271 slices shown]
[im 91/271  vessel]
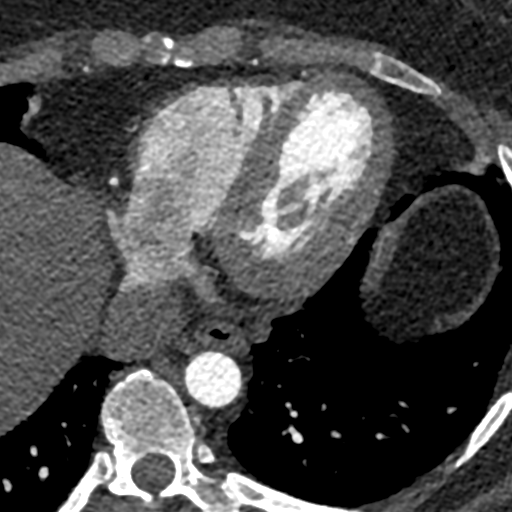
[im 181/271  vessel]
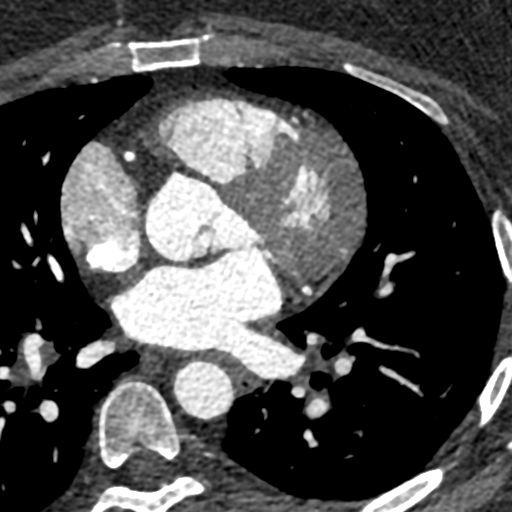

[Series 9: best syst 40 % · axial · 0.35mm/px · z∈[+18,+72]mm · 3 of 271 slices shown, 4 images]
[im 68/271  vessel]
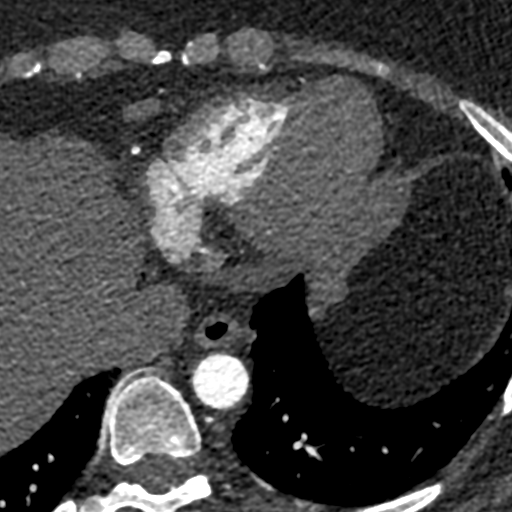
[im 68/271  lung]
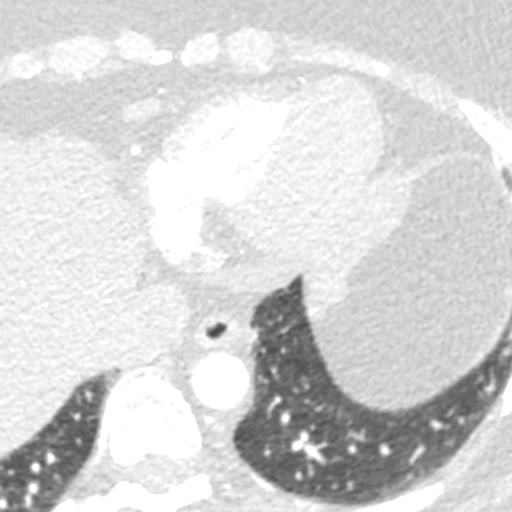
[im 136/271  vessel]
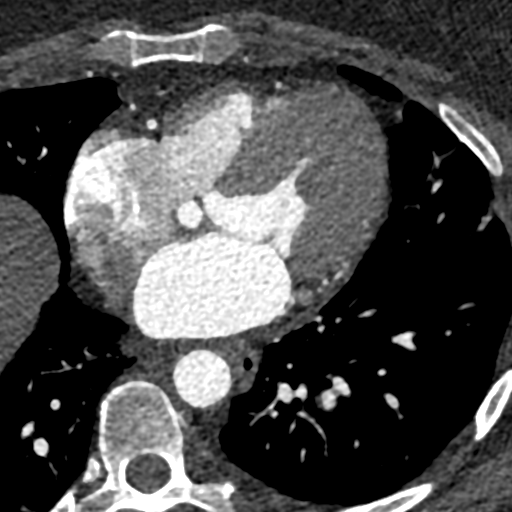
[im 203/271  vessel]
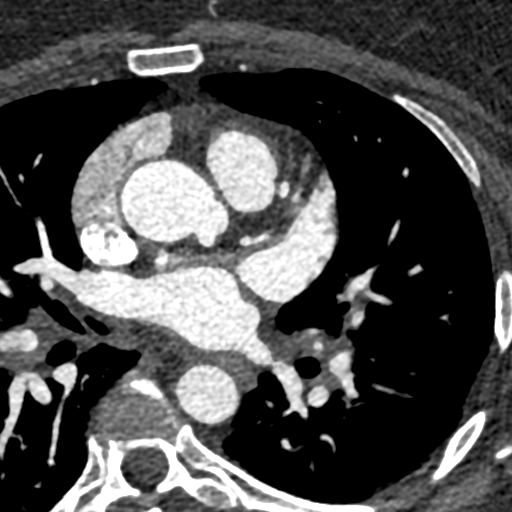

[Series 10: ts diast sharp 71 % · axial · 0.35mm/px · z∈[+18,+72]mm · 3 of 271 slices shown]
[im 68/271  lung]
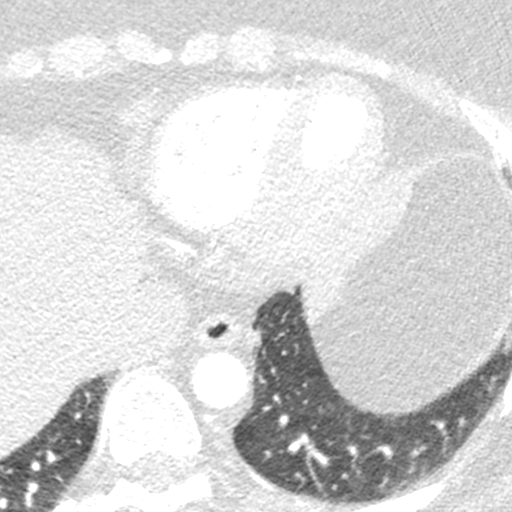
[im 136/271  lung]
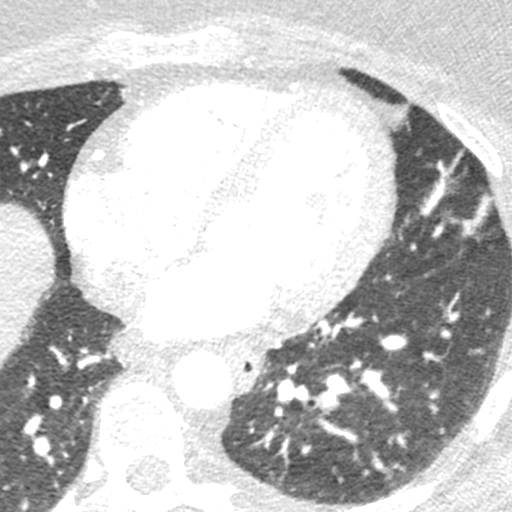
[im 203/271  lung]
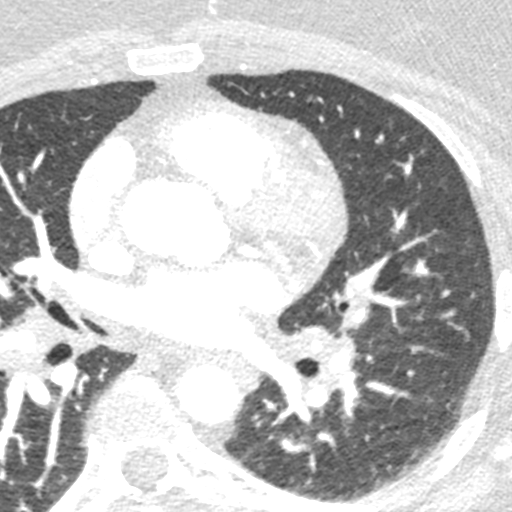

[8 of 20 positions shown; findings below may reference images not displayed]

FINDINGS: Vascular: Normal aortic caliber. No central pulmonary embolism, on
this non-dedicated study.

Mediastinum/Nodes: No mediastinal adenopathy. Prominent but not
pathologically enlarged nodal tissue within both hila and infrahilar
regions. Not well evaluated on the prior noncontrast imaging.
Favored to be reactive.

Lungs/Pleura: No pleural fluid. 4 mm lingular nodule on 14/14,
present on 12/17/2018 and considered benign.

Pleural-based left lower lobe 5 mm nodule on 29/14 is also similar
on 12/17/2018 and considered benign

More cephalad left lower lobe pleural-based nodule or area of
pleural thickening measures 7 mm on 25/14, also similar on the
prior.

Upper Abdomen: Mild hepatic steatosis. Normal imaged portions of the
spleen, stomach.

Musculoskeletal: No acute osseous abnormality.
IMPRESSION: 1. No acute findings in the imaged extracardiac chest.
2. Hepatic steatosis.
FINDINGS: Image quality: excellent.

Noise artifact is: Limited.

Coronary Arteries:  Normal coronary origin.  Right dominance.

Left main: The left main is a large caliber vessel with a normal
take off from the left coronary cusp that bifurcates to form a left
anterior descending artery and a left circumflex artery. There is no
plaque or stenosis.

Left anterior descending artery: The LAD is patent without evidence
of plaque or stenosis. There is a mid LAD myocardial bridge (normal
variant). The LAD gives off 2 patent diagonal branches.

Left circumflex artery: The LCX is non-dominant and patent with no
evidence of plaque or stenosis. The LCX gives off 2 patent obtuse
marginal branches.

Right coronary artery: The RCA is dominant with normal take off from
the right coronary cusp. There is no evidence of plaque or stenosis.
The RCA terminates as a PDA and right posterolateral branch without
evidence of plaque or stenosis.

Right Atrium: Right atrial size is within normal limits.

Right Ventricle: The right ventricular cavity is within normal
limits.

Left Atrium: Left atrial size is normal in size with no left atrial
appendage filling defect.

Left Ventricle: The ventricular cavity size is within normal limits.
There are no stigmata of prior infarction. There is no abnormal
filling defect.

Pulmonary arteries: Normal in size without proximal filling defect.

Pulmonary veins: Normal pulmonary venous drainage.

Pericardium: Normal thickness with no significant effusion or
calcium present.

Cardiac valves: The aortic valve is trileaflet without significant
calcification. The mitral valve is normal structure without
significant calcification.

Aorta: Normal caliber with no significant disease.

Extra-cardiac findings: See attached radiology report for
non-cardiac structures.
IMPRESSION: 1. Coronary calcium score of 0.

2. Normal coronary origin with right dominance.

3. Normal coronary arteries.

4. Mid LAD myocardial bridge (normal variant).

RECOMMENDATIONS:
1. No evidence of CAD (0%). Consider non-atherosclerotic causes of
chest pain.

*** End of Addendum ***
EXAM:
OVER-READ INTERPRETATION  CT CHEST

The following report is an over-read performed by radiologist Dr.
over-read does not include interpretation of cardiac or coronary
anatomy or pathology. The coronary CTA interpretation by the
cardiologist is attached.
FINDINGS: Vascular: Normal aortic caliber. No central pulmonary embolism, on
this non-dedicated study.

Mediastinum/Nodes: No mediastinal adenopathy. Prominent but not
pathologically enlarged nodal tissue within both hila and infrahilar
regions. Not well evaluated on the prior noncontrast imaging.
Favored to be reactive.

Lungs/Pleura: No pleural fluid. 4 mm lingular nodule on 14/14,
present on 12/17/2018 and considered benign.

Pleural-based left lower lobe 5 mm nodule on 29/14 is also similar
on 12/17/2018 and considered benign

More cephalad left lower lobe pleural-based nodule or area of
pleural thickening measures 7 mm on 25/14, also similar on the
prior.

Upper Abdomen: Mild hepatic steatosis. Normal imaged portions of the
spleen, stomach.

Musculoskeletal: No acute osseous abnormality.
IMPRESSION: 1. No acute findings in the imaged extracardiac chest.
2. Hepatic steatosis.

## 2022-09-19 ENCOUNTER — Ambulatory Visit: Payer: BC Managed Care – PPO | Admitting: Internal Medicine

## 2022-09-19 ENCOUNTER — Encounter: Payer: Self-pay | Admitting: Internal Medicine

## 2022-09-19 VITALS — BP 120/68 | HR 81 | Ht 62.0 in | Wt 198.0 lb

## 2022-09-19 DIAGNOSIS — E119 Type 2 diabetes mellitus without complications: Secondary | ICD-10-CM

## 2022-09-19 DIAGNOSIS — E042 Nontoxic multinodular goiter: Secondary | ICD-10-CM | POA: Diagnosis not present

## 2022-09-19 LAB — POCT GLYCOSYLATED HEMOGLOBIN (HGB A1C): Hemoglobin A1C: 6.6 % — AB (ref 4.0–5.6)

## 2022-09-19 MED ORDER — TIRZEPATIDE 2.5 MG/0.5ML ~~LOC~~ SOAJ: 2.5000 mg | SUBCUTANEOUS | 6 refills | Status: DC

## 2022-09-19 NOTE — Patient Instructions (Signed)
Start Mounjaro 2.5 mg weekly  Continue Metformin 500 mg , 2 tablets before Supper    HOW TO TREAT LOW BLOOD SUGARS (Blood sugar LESS THAN 70 MG/DL) Please follow the RULE OF 15 for the treatment of hypoglycemia treatment (when your (blood sugars are less than 70 mg/dL)   STEP 1: Take 15 grams of carbohydrates when your blood sugar is low, which includes:  3-4 GLUCOSE TABS  OR 3-4 OZ OF JUICE OR REGULAR SODA OR ONE TUBE OF GLUCOSE GEL    STEP 2: RECHECK blood sugar in 15 MINUTES STEP 3: If your blood sugar is still low at the 15 minute recheck --> then, go back to STEP 1 and treat AGAIN with another 15 grams of carbohydrates.

## 2022-09-19 NOTE — Progress Notes (Signed)
Name: [name]  MRN/ DOB: [id number], Mar 17, 1966   Age/ Sex: 57 y.o., female    PCP: [facility], [place]   Reason for Endocrinology Evaluation: Type 2 Diabetes Mellitus     Date of Initial Endocrinology Visit: 05/05/2022    PATIENT IDENTIFIER: Ms. [name] is a 57 y.o. female with a past medical history of OSA, DM and HTN, Sarcoidosis (Dx 2024). The patient presented for initial endocrinology clinic visit on 05/05/2022 for consultative assistance with her diabetes management.    HPI: [name] was    Diagnosed with DM 03/2021 Prior Medications tried/Intolerance: Metformin started 01/2022 and Jardiance 03/2022 but this was discontinued due to recurrent genital infections 04/2022. Ozempic -nausea  Hemoglobin A1c has ranged from 5.7% in 2018, peaking at 6.5% in 03/2022.  On her initial visit to our clinic she had an A1c 6.0%, she was on jardiance and Metformin, we stopped Jardiance due to recurrent genital infections   THYROID HISTORY: Patient was noted with multinodular goiter on thyroid ultrasound 04/2021.  She is S/P FNA of the right mid 2.8 cm nodule with atypia of undetermined significance (Bethesda category III). Per pt Afirma was benign    Due to symptoms of dysphagia, barium swallow was performed which was normal 05/2022    SUBJECTIVE:   During the last visit (05/05/2022): A1c 6.0%      Today (09/19/22): [name] is here for a follow up on diabetes management and MNG .  She checks her blood sugars 1 times daily. The patient has not had hypoglycemic episodes since the last clinic visit.   She continues to follow up with urogynecology , S/P cystoscopy  She was recently diagnosed with sarcoidosis , she had been on Prednisone for the past month, she is on a taper and started MTX  She has been tired  Has chronic nausea and takes Zofran No changes in bowel movement  Continues with  tingling of the hands , mainly at  night  Denies local neck swelling   No heartburn     HOME DIABETES REGIMEN: Metformin 500 mg 2 tabs daily with supper       Statin: no ACE-I/ARB: yes Prior Diabetic Education: no  METER DOWNLOAD SUMMARY: 11/18-09/19/2022 Fingerstick Blood Glucose Tests = 130 Average Number Tests/Day = 0.9 Overall Mean FS Glucose = 140 Standard Deviation = 18.7  BG Ranges: Low = 116 High = 150  BG Target % Results: % In target = 35 % Over target = 65 % Under target = 0  Hypoglycemic Events/30 Days: BG < 50 = 0 Episodes of symptomatic severe hypoglycemia = 0    DIABETIC COMPLICATIONS: Microvascular complications:   Denies: CKD, retinopathy, neuropathy  Last eye exam: Completed 11/2021  Macrovascular complications:   Denies: CAD, PVD, CVA   PAST HISTORY: Past Medical History:  Past Medical History:  Diagnosis Date   Anxiety    Back pain    Constipation    Diabetes mellitus without complication    Fatty liver    Hypercholesteremia    Hypertension    Intestinal bacterial overgrowth    Joint pain    Kidney problem    Kidney stones    Lichen planus [id number]   vulva   Migraines    Nausea in adult    S/P breast biopsy 04/06/2021   right breast biopsy   Shortness of breath    Stress    Subclinical hypothyroidism 09/2009   Past Surgical History:  Past Surgical History:  Procedure Laterality Date   BLADDER SUSPENSION N/A 06/05/2022   Procedure: TRANSVAGINAL TAPE (TVT) PROCEDURE;  Surgeon: Jaquita Folds, MD;  Location: [facility];  Service: Gynecology;  Laterality: N/A;  total time requested is 1 hour   COLONOSCOPY     CYSTOSCOPY N/A 06/05/2022   Procedure: CYSTOSCOPY;  Surgeon: Jaquita Folds, MD;  Location: [facility];  Service: Gynecology;  Laterality: N/A;   LITHOTRIPSY     NOVASURE ABLATION  12/18/2006   RADIOACTIVE SEED GUIDED EXCISIONAL BREAST BIOPSY Right 05/30/2021   Procedure: RADIOACTIVE SEED GUIDED EXCISIONAL RIGHT BREAST BIOPSY;  Surgeon:  Rolm Bookbinder, MD;  Location: [facility];  Service: General;  Laterality: Right;   ROTATOR CUFF REPAIR Right 06/20/2007    Social History:  reports that she has never smoked. She has never used smokeless tobacco. She reports that she does not drink alcohol and does not use drugs. Family History:  Family History  Problem Relation Age of Onset   Diabetes Mother    Hyperlipidemia Mother    Thyroid disease Mother    Anxiety disorder Mother    Obesity Mother    Aortic stenosis Mother    Colon cancer Paternal Grandmother    Heart disease Paternal Grandfather    Pancreatic cancer Neg Hx    Stomach cancer Neg Hx    Liver disease Neg Hx    Esophageal cancer Neg Hx      HOME MEDICATIONS: Allergies as of 09/19/2022       Reactions   Tizanidine Hcl Other (See Comments)   Cant move Cant move   Clarithromycin Nausea Only   Severe stomach cramps Severe stomach cramps        Medication List        Accurate as of September 19, 2022  8:47 AM. If you have any questions, ask your nurse or doctor.          STOP taking these medications    FISH OIL PO Stopped by: Dorita Sciara, MD   losartan-hydrochlorothiazide 50-12.5 MG tablet Commonly known as: HYZAAR Stopped by: Dorita Sciara, MD       TAKE these medications    clobetasol ointment 0.05 % Commonly known as: TEMOVATE Apply 1 Application topically 2 (two) times daily. Use for 2 weeks at a time for a flare.  You may use the cream twice a week at bedtime for maintenance dosing.   estradiol 0.1 MG/GM vaginal cream Commonly known as: ESTRACE Place 0.5g nightly for two weeks then twice a week after   metFORMIN 500 MG 24 hr tablet Commonly known as: GLUCOPHAGE-XR Take 2 tablets (1,000 mg total) by mouth daily before supper.   nystatin ointment Commonly known as: MYCOSTATIN Apply 1 Application topically 2 (two) times daily. Apply to affected area for up to 7 days.   ondansetron 8 MG  tablet Commonly known as: ZOFRAN Take 1 tablet (8 mg total) by mouth every 8 (eight) hours as needed for nausea or vomiting.   Trospium Chloride 60 MG Cp24 Take 1 capsule (60 mg total) by mouth daily.   valACYclovir 1000 MG tablet Commonly known as: VALTREX Take 2 Tablets By Mouth Every 12 Hours for 24 hours as needed for an outbreak.         ALLERGIES: Allergies  Allergen Reactions   Tizanidine Hcl Other (See Comments)    Cant move Cant move   Clarithromycin Nausea Only    Severe stomach cramps Severe stomach cramps  REVIEW OF SYSTEMS: A comprehensive ROS was conducted with the patient and is negative except as per HPI    OBJECTIVE:   VITAL SIGNS: BP 120/68   Pulse 81   Ht 5\' 2"  (1.575 m)   Wt 198 lb (89.8 kg)   SpO2 98%   BMI 36.21 kg/m    PHYSICAL EXAM:  General: Pt appears well and is in NAD  Neck: General: Supple without adenopathy or carotid bruits. Thyroid: Thyroid size normal.  Right  nodule appreciated.   Lungs: Clear with good BS bilat with no rales, rhonchi, or wheezes  Heart: RRR   Abdomen:  soft, nontender  Extremities:  Lower extremities - No pretibial edema.   Neuro: MS is good with appropriate affect, pt is alert and Ox3    DATA REVIEWED:  Lab Results  Component Value Date   HGBA1C 6.0 (A) 05/05/2022   HGBA1C 6.3 (H) 12/15/2019   HGBA1C 6.2 (H) 12/06/2018    Latest Reference Range & Units 06/05/22 09:12  Sodium 135 - 145 mmol/L 140  Potassium 3.5 - 5.1 mmol/L 3.9  Chloride 98 - 111 mmol/L 100  Glucose 70 - 99 mg/dL 106 (H)  BUN 6 - 20 mg/dL 9  Creatinine 0.44 - 1.00 mg/dL 0.60  Calcium Ionized 1.15 - 1.40 mmol/L 1.18   01/17/2022 TSH 3.310 uIU/mL   Thyroid Ultrasound 01/20/2022 Estimated total number of nodules >/= 1 cm: 2   Number of spongiform nodules >/=  2 cm not described below (TR1): 0   Number of mixed cystic and solid nodules >/= 1.5 cm not described below ([place]): 0    _________________________________________________________   Nodule labeled 1, inferior right thyroid, 2.8 cm. Nodule is unchanged and has undergone prior biopsy. Assuming benign result, no further specific follow-up would be indicated.   Nodule labeled 2 inferior left thyroid, 1.9 cm. This has TR 3 characteristics and meets criteria for surveillance.   No adenopathy   IMPRESSION: Left inferior thyroid nodule (labeled 2, 1.9 cm, TR 3) meets criteria for surveillance, as designated by the newly established ACR TI-RADS criteria. Surveillance ultrasound study recommended to be performed annually up to 5 years.   FNA right mid 07/07/2021 Clinical History: Rt. Mid thyroid nodule 2.5 x 2.8 x 2.1 cm  Specimen Submitted:  A. THYROID, RIGHT LOBE, FINE NEEDLE ASPIRATION:    FINAL MICROSCOPIC DIAGNOSIS:  - Atypia of undetermined significance (Bethesda category III)    ASSESSMENT / PLAN / RECOMMENDATIONS:   1) Type 2 Diabetes Mellitus, Optimally controlled, Without complications - Most recent A1c of 6.6 %. Goal A1c < 7.0 %.    -A1c remains at goal but it has trended up with discontinuation of Jardiance as well as being on prednisone -She has also been noted with weight gain, we discussed add-on therapy to help offset weight gain and manage diabetes while she is on glucocorticoid therapy for the next 3 months -She has nausea with Ozempic -She developed recurrent genital infections with Jardiance -We discussed Mounjaro, cautioned against GI side effects  MEDICATIONS: Take metformin 500 mg XR 2 tabs before supper Start Mounjaro 2.5 mg weekly   EDUCATION / INSTRUCTIONS: BG monitoring instructions: Patient is instructed to check her blood sugars 1 times a day, fasting. Call [place] [facility] if: BG persistently < 70  I reviewed the Rule of 15 for the treatment of hypoglycemia in detail with the patient. Literature supplied.   2) Diabetic complications:  Eye: Does not have  known diabetic retinopathy.  Neuro/ Feet:  Does not have known diabetic peripheral neuropathy. Renal: Patient does not have known baseline CKD.    3) MNG   - S/P FNA of the 2.8 cm right nodule with Atypia . Afirma benign per pt ( record not available )  -Repeating thyroid ultrasound showed stability 01/2022 -TFTs have been normal   4) tingling of the hands:  -Suspect carpal tunnel syndrome -Patient may use wrist braces at night for the next month  Follow-up in 6 months   Signed electronically by: Mack Guise, MD  [facility] End[facility]  [address] [place], [place] [address] Phone: [phone] FAX: [phone]   CC: [facility], [place] [address] Phone: [phone]  Fax: [phone]    Return to Endocrinology clinic as below: Future Appointments  Date Time Provider [place]  09/19/2022  8:50 AM Christyana Corwin, Melanie Crazier, MD [facility] None  09/25/2022  9:20 AM Collier Salina, MD [facility] None  10/02/2022  7:45 AM [facility] NURSE [facility] None  10/02/2022  8:00 AM Rondel Jumbo, PA-C [facility] None  10/02/2022  9:30 AM Garner Nash, DO [facility] None  05/03/2023  9:30 AM Nunzio Cobbs, MD [facility] None  08/14/2023  9:20 AM Jaquita Folds, MD [facility]

## 2022-09-24 NOTE — Progress Notes (Signed)
Office Visit Note  Patient: [name]             Date of Birth: 04/29/1966           MRN: [id number]             PCP: Fatima Sanger, FNP Referring: Briant Cedar, PA-C Visit Date: 09/25/2022   Subjective:  New Patient (Initial Visit) (Patient states she did see another rheumatologist while waiting to be seen at our office.)   History of Present Illness: [name] is a 57 y.o. female here for sarcoidosis. 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  Some of the GI issues have been ongoing for up to 2 years although worsened more recently.  Previous history of mild gastritis on EGD from 2022 and with multiple tiny splenic lesions on CT imaging.  Subsequently had pet imaging that led to biopsy of hypermetabolic nodule.  Also in the past year is having worsening trouble with cognitive issues short-term memory and attention problems. Saw Dr. Deanne Coffer for this condition after initial referral for biopsy findings.  Started on oral prednisone treatment course at 20 mg daily tapering this down was also just prescribed oral methotrexate for steroid sparing DMARD. ith addition of steroid the abdominal symptoms improved though she still has some persistent left-sided back pain and soreness.   She experiences some shortness of breath with exertion but no other focal respiratory symptoms such as chest pain or cough.  Had hilar adenopathy on imaging no history of severe chronic respiratory infections.  She is scheduled to see pulmonology clinic coming up shortly for evaluation. Also had referral for MRI of the brain that was not concerning for neurosarcoidosis.  Was also started on ADHD medication for the cognitive difficulties but just started taking this so not sure how much it is helping or not yet. No new skin rashes or  nodules.   Activities of Daily Living:  Patient reports morning stiffness for 20-30 minutes.   Patient Reports nocturnal pain.  Difficulty dressing/grooming: Denies Difficulty climbing stairs: Reports Difficulty getting out of chair: Denies Difficulty using hands for taps, buttons, cutlery, and/or writing: Reports  Review of Systems  Constitutional:  Positive for fatigue.  HENT:  Positive for mouth dryness. Negative for mouth sores.   Eyes:  Positive for dryness.  Respiratory:  Positive for shortness of breath.   Cardiovascular:  Negative for chest pain and palpitations.  Gastrointestinal:  Negative for blood in stool, constipation and diarrhea.  Endocrine: Positive for increased urination.  Genitourinary:  Negative for involuntary urination.  Musculoskeletal:  Positive for myalgias, morning stiffness, muscle tenderness and myalgias. Negative for joint pain, gait problem, joint pain, joint swelling and muscle weakness.  Skin:  Negative for color change, rash, hair loss and sensitivity to sunlight.  Allergic/Immunologic: Negative for susceptible to infections.  Neurological:  Positive for headaches. Negative for dizziness.  Hematological:  Negative for swollen glands.  Psychiatric/Behavioral:  Positive for sleep disturbance. Negative for depressed mood. The patient is not nervous/anxious.     PMFS History:  Patient Active Problem List   Diagnosis Date Noted   Anxiety disorder, unspecified 09/25/2022   Hypertension 09/25/2022   Depression 09/25/2022   Type 2 diabetes mellitus (HCC) 09/25/2022   Sarcoidosis 09/25/2022   High risk medication use 09/25/2022   Lymphadenopathy 06/13/2022   Splenic lesion 05/20/2022   Type 2 diabetes  mellitus without complication, without long-term current use of insulin (HCC) 05/05/2022   Other allergic rhinitis 04/04/2022   Pain in thoracic spine 07/18/2021   Lumbar pain 07/12/2021   Myalgia, unspecified site 06/21/2021   Contact dermatitis  02/11/2021   Chronic foot pain 10/08/2019   Sleep disturbance 10/08/2019   Elevated LFTs 12/12/2018   Mixed hyperlipidemia 12/12/2018   Fen-phen history 11/24/2018   Morbid obesity (HCC) 11/24/2018    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   Migraines    Nausea in adult    S/P breast biopsy 04/06/2021   right breast biopsy   Sarcoidosis    Shortness of breath    Stress    Subclinical hypothyroidism 09/2009    Family History  Problem Relation Age of Onset   Diabetes Mother    Hyperlipidemia Mother    Thyroid disease Mother    Anxiety disorder Mother    Obesity Mother    Aortic stenosis Mother    Arthritis-Osteo Sister    Colon cancer Paternal Grandmother    Heart disease Paternal Grandfather    Pancreatic cancer Neg Hx    Stomach cancer Neg Hx    Liver disease Neg Hx    Esophageal cancer Neg Hx    Past Surgical History:  Procedure Laterality Date   BLADDER SUSPENSION N/A 06/05/2022   Procedure: TRANSVAGINAL TAPE (TVT) PROCEDURE;  Surgeon: Marguerita Beards, MD;  Location: [facility];  Service: Gynecology;  Laterality: N/A;  total time requested is 1 hour   COLONOSCOPY     CYSTOSCOPY N/A 06/05/2022   Procedure: CYSTOSCOPY;  Surgeon: Marguerita Beards, MD;  Location: [facility];  Service: Gynecology;  Laterality: N/A;   LITHOTRIPSY     LYMPH NODE DISSECTION     NOVASURE ABLATION  12/18/2006   RADIOACTIVE SEED GUIDED EXCISIONAL BREAST BIOPSY Right 05/30/2021   Procedure: RADIOACTIVE SEED GUIDED EXCISIONAL RIGHT BREAST BIOPSY;  Surgeon: Emelia Loron, MD;  Location: [facility];  Service: General;  Laterality: Right;   ROTATOR CUFF REPAIR Right 06/20/2007    Social History   Social History Narrative   Right handed   Drinks coffee   Lives with husband   Two story home   unemployed   Immunization History  Administered Date(s) Administered   PFIZER(Purple Top)SARS-COV-2 Vaccination 11/05/2019   Tdap 06/04/2007, 03/08/2018     Objective: Vital Signs: BP (!) 152/94 (BP Location: Left Arm, Patient Position: Sitting, Cuff Size: Normal)   Pulse 82   Resp 16   Ht 5' 1.25" (1.556 m)   Wt 196 lb (88.9 kg)   BMI 36.73 kg/m    Physical Exam HENT:     Mouth/Throat:     Mouth: Mucous membranes are moist.     Pharynx: Oropharynx is clear.  Eyes:     Conjunctiva/sclera: Conjunctivae normal.  Cardiovascular:     Rate and Rhythm: Normal rate and regular rhythm.  Pulmonary:     Effort: Pulmonary effort is normal.     Breath sounds: Normal breath sounds.  Musculoskeletal:     Right lower leg: No edema.  Left lower leg: No edema.  Lymphadenopathy:     Cervical: No cervical adenopathy.  Skin:    General: Skin is warm and dry.     Findings: No rash.  Neurological:     Mental Status: She is alert.  Psychiatric:        Mood and Affect: Mood normal.      Musculoskeletal Exam:  Neck full ROM no tenderness Shoulders full ROM no tenderness or swelling Elbows full ROM no tenderness or swelling Wrists full ROM no tenderness or swelling Fingers full ROM no tenderness or swelling Knees full ROM no tenderness or swelling Ankles full ROM no tenderness or swelling MTPs full ROM no tenderness or swelling   Investigation: No additional findings.  Imaging: MR BRAIN W WO CONTRAST  Result Date: 10/23/2022 CLINICAL DATA:  Provided history: Sarcoidosis. Memory difficulties. Memory loss. Additional history provided by the scanning technologist: Confusion/brain fog, nausea, vision difficulty. EXAM: MRI HEAD WITHOUT AND WITH CONTRAST TECHNIQUE: Multiplanar, multiecho pulse sequences of the brain and surrounding structures were obtained without  and with intravenous contrast. CONTRAST:  9 mL Vueway intravenous contrast. COMPARISON:  Brain MRI 08/28/2022. FINDINGS: Brain: No age advanced or lobar predominant parenchymal atrophy. Similar to the prior brain MRI of 08/28/2022, there are several small foci of T2 FLAIR hyperintense signal abnormality scattered within the cerebral white matter. Findings are nonspecific, ut likely reflect mild chronic small vessel ischemic changes given the patient's history of diabetes mellitus, hypertension and hypercholesterolemia. There is no acute infarct. No evidence of an intracranial mass. No chronic intracranial blood products. No extra-axial fluid collection. No midline shift. No pathologic intracranial enhancement identified. Vascular: Maintained flow voids within the proximal large arterial vessels. Skull and upper cervical spine: No focal suspicious marrow lesion. Sinuses/Orbits: No mass or acute finding within the imaged orbits. No significant inflammatory paranasal sinus disease. Asymmetrically small right maxillary sinus. IMPRESSION: 1.  No evidence of an acute intracranial abnormality. 2. No evidence of intracranial sarcoidosis involvement. 3.  No age advanced or lobar predominant parenchymal atrophy. 4. Mild multifocal T2 FLAIR hyperintense signal abnormality within the cerebral white matter, similar to the prior brain MRI of 08/28/2022. Findings are nonspecific, but likely reflect chronic small vessel ischemic changes given the patient's history of diabetes mellitus, hypertension and hypercholesterolemia. Electronically Signed   By: Jackey Loge D.O.   On: 10/23/2022 14:43    Recent Labs: Lab Results  Component Value Date   WBC 9.5 05/19/2022   HGB 14.3 06/05/2022   PLT 357 05/19/2022   NA 140 06/05/2022   K 3.9 06/05/2022   CL 100 06/05/2022   CO2 29 05/19/2022   GLUCOSE 106 (H) 06/05/2022   BUN 9 06/05/2022   CREATININE 0.60 06/05/2022   BILITOT 0.4 05/19/2022   ALKPHOS 79 05/19/2022   AST 31  05/19/2022   ALT 34 05/19/2022   PROT 7.8 05/19/2022   ALBUMIN 4.6 05/19/2022   CALCIUM 10.5 (H) 05/19/2022   GFRAA 112 05/18/2020    Speciality Comments: No specialty comments available.  Procedures:  No procedures performed Allergies: Tizanidine hcl and Clarithromycin   Assessment / Plan:     Visit Diagnoses: Sarcoidosis  Overall I agree with current assessment and initial treatment plan with methotrexate and glucocorticoids.  Too early in starting the treatment for any additional modification at this time.  Agree with continuing the methotrexate rapid titration up to 20 mg weekly with gradual prednisone taper.  She needs to get evaluated by pulmonology for evidence of any  functional impact of mediastinal involvement.  Depending on initial workup will probably need echocardiogram and possibly updated EKG. no clear dermatitis or synovitis  High risk medication use  Reviewed risks of long-term use of methotrexate and prednisone including infections, cytopenias, hepatotoxicity, weight gain, and hyperglycemia.  Will plan to follow-up in about 3 weeks would be appropriate timeframe for retesting labs on the new start methotrexate with titration.  Orders: No orders of the defined types were placed in this encounter.  No orders of the defined types were placed in this encounter.  At least 45 minutes were spent today on this encounter due to detailed review of previously obtained laboratory test, pathology report, and imaging findings then counseling patient on these and the diagnosis and initial treatment plan.  Follow-Up Instructions: Return in about 3 weeks (around 10/16/2022) for New pt sarcoidosis MTX/GC f/u 3wks.   Fuller Plan, MD  Note - This record has been created using [employer].  Chart creation errors have been sought, but may not always  have been located. Such creation errors do not reflect on  the standard of medical care.

## 2022-09-25 ENCOUNTER — Encounter: Payer: Self-pay | Admitting: Internal Medicine

## 2022-09-25 ENCOUNTER — Ambulatory Visit: Payer: BC Managed Care – PPO | Attending: Internal Medicine | Admitting: Internal Medicine

## 2022-09-25 VITALS — BP 152/94 | HR 82 | Resp 16 | Ht 61.25 in | Wt 196.0 lb

## 2022-09-25 DIAGNOSIS — R591 Generalized enlarged lymph nodes: Secondary | ICD-10-CM | POA: Diagnosis not present

## 2022-09-25 DIAGNOSIS — F411 Generalized anxiety disorder: Secondary | ICD-10-CM

## 2022-09-25 DIAGNOSIS — D869 Sarcoidosis, unspecified: Secondary | ICD-10-CM | POA: Insufficient documentation

## 2022-09-25 DIAGNOSIS — F32A Depression, unspecified: Secondary | ICD-10-CM | POA: Insufficient documentation

## 2022-09-25 DIAGNOSIS — Z79899 Other long term (current) drug therapy: Secondary | ICD-10-CM

## 2022-09-25 DIAGNOSIS — E119 Type 2 diabetes mellitus without complications: Secondary | ICD-10-CM | POA: Insufficient documentation

## 2022-09-25 DIAGNOSIS — I10 Essential (primary) hypertension: Secondary | ICD-10-CM | POA: Insufficient documentation

## 2022-09-25 DIAGNOSIS — F419 Anxiety disorder, unspecified: Secondary | ICD-10-CM | POA: Insufficient documentation

## 2022-09-25 DIAGNOSIS — D7389 Other diseases of spleen: Secondary | ICD-10-CM | POA: Diagnosis not present

## 2022-09-25 HISTORY — DX: Generalized anxiety disorder: F41.1

## 2022-09-28 ENCOUNTER — Telehealth: Payer: Self-pay

## 2022-09-28 NOTE — Telephone Encounter (Signed)
Patient contacted the office and states there is no after visit summary or notes for her visit on 09/25/2022. Please advise.

## 2022-10-02 ENCOUNTER — Encounter: Payer: Self-pay | Admitting: Pulmonary Disease

## 2022-10-02 ENCOUNTER — Ambulatory Visit (INDEPENDENT_AMBULATORY_CARE_PROVIDER_SITE_OTHER): Payer: BC Managed Care – PPO | Admitting: Physician Assistant

## 2022-10-02 ENCOUNTER — Ambulatory Visit: Payer: BC Managed Care – PPO

## 2022-10-02 ENCOUNTER — Other Ambulatory Visit: Payer: Self-pay | Admitting: Obstetrics and Gynecology

## 2022-10-02 ENCOUNTER — Ambulatory Visit (INDEPENDENT_AMBULATORY_CARE_PROVIDER_SITE_OTHER): Payer: BC Managed Care – PPO | Admitting: Pulmonary Disease

## 2022-10-02 ENCOUNTER — Encounter: Payer: Self-pay | Admitting: Physician Assistant

## 2022-10-02 VITALS — BP 140/80 | HR 93 | Ht 61.0 in | Wt 195.6 lb

## 2022-10-02 VITALS — BP 135/76 | HR 96 | Resp 18 | Ht 61.0 in | Wt 196.0 lb

## 2022-10-02 DIAGNOSIS — D869 Sarcoidosis, unspecified: Secondary | ICD-10-CM

## 2022-10-02 DIAGNOSIS — R5383 Other fatigue: Secondary | ICD-10-CM

## 2022-10-02 DIAGNOSIS — R413 Other amnesia: Secondary | ICD-10-CM

## 2022-10-02 NOTE — Progress Notes (Signed)
Synopsis: Referred in April 2024 for abnormal PET scan, adenopathy by Mariea Stable, NP  Subjective:   PATIENT ID: [name] GENDER: female DOB: 11-Nov-1965, MRN: [id number]  Chief Complaint  Patient presents with   Consult    Consult for sarcoidosis    This is a 57 year old female, past medical history of back pain, constipation, type 2 diabetes, hypertension, kidney stones.  Presents with a new diagnosis of sarcoidosis after a inguinal node excision that shows not nonnecrotizing granulomas.  She has abnormal pet imaging that shows mediastinal and hilar adenopathy as well as splenic involvement.  She was started on prednisone and methotrexate by rheumatology.  From a respiratory standpoint she does feel short of breath with exertion.  She has not had PFTs.  She has had an eye exam and there is no evidence of uveitis.  She has not had a recent echocardiogram.    Past Medical History:  Diagnosis Date   Anxiety    Back pain    Constipation    Diabetes mellitus without complication    Fatty liver    Hypercholesteremia    Hypertension    Intestinal bacterial overgrowth    Joint pain    Kidney problem    Kidney stones    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   Migraines    Nausea in adult    S/P breast biopsy 04/06/2021   right breast biopsy   Sarcoidosis    Shortness of breath    Stress    Subclinical hypothyroidism 09/2009     Family History  Problem Relation Age of Onset   Diabetes Mother    Hyperlipidemia Mother    Thyroid disease Mother    Anxiety disorder Mother    Obesity Mother    Aortic stenosis Mother    Arthritis-Osteo Sister    Colon cancer Paternal Grandmother    Heart disease Paternal Grandfather    Pancreatic cancer Neg Hx    Stomach cancer Neg Hx    Liver disease Neg Hx    Esophageal cancer Neg Hx      Past Surgical History:  Procedure Laterality Date   BLADDER SUSPENSION N/A 06/05/2022   Procedure: TRANSVAGINAL TAPE (TVT) PROCEDURE;  Surgeon:  Marguerita Beards, MD;  Location: [facility] [place];  Service: Gynecology;  Laterality: N/A;  total time requested is 1 hour   COLONOSCOPY     CYSTOSCOPY N/A 06/05/2022   Procedure: CYSTOSCOPY;  Surgeon: Marguerita Beards, MD;  Location: [facility];  Service: Gynecology;  Laterality: N/A;   LITHOTRIPSY     LYMPH NODE DISSECTION     NOVASURE ABLATION  12/18/2006   RADIOACTIVE SEED GUIDED EXCISIONAL BREAST BIOPSY Right 05/30/2021   Procedure: RADIOACTIVE SEED GUIDED EXCISIONAL RIGHT BREAST BIOPSY;  Surgeon: Emelia Loron, MD;  Location: [facility];  Service: General;  Laterality: Right;   ROTATOR CUFF REPAIR Right 06/20/2007    Social History   Socioeconomic History   Marital status: Married    Spouse name: Alinda Money   Number of children: 2   Years of education: Not on file   Highest education level: Not on file  Occupational History   Occupation: Hydrologist   Occupation: Chemical engineer  Tobacco Use   Smoking status: Never    Passive exposure: Never   Smokeless tobacco: Never  Vaping Use   Vaping Use: Never used  Substance and Sexual Activity   Alcohol use: No  Alcohol/week: 0.0 standard drinks of alcohol   Drug use: No   Sexual activity: Yes    Partners: Male    Birth control/protection: Surgical    Comment: Ablation, Vasectomy  Other Topics Concern   Not on file  Social History Narrative   Right handed   Drinks coffee   Lives with husband   Two story home   unemployed   Social Determinants of Health   Financial Resource Strain: Not on file  Food Insecurity: Not on file  Transportation Needs: Not on file  Physical Activity: Not on file  Stress: Not on file  Social Connections: Not on file  Intimate Partner Violence: Not on file     Allergies  Allergen Reactions   Tizanidine Hcl Other (See Comments)    Cant move Cant move   Clarithromycin Nausea Only    Severe  stomach cramps Severe stomach cramps     Outpatient Medications Prior to Visit  Medication Sig Dispense Refill   amphetamine-dextroamphetamine (ADDERALL) 10 MG tablet Take 10 mg by mouth 2 (two) times daily.     clobetasol ointment (TEMOVATE) 0.05 % Apply 1 Application topically 2 (two) times daily. Use for 2 weeks at a time for a flare.  You may use the cream twice a week at bedtime for maintenance dosing. 60 g 1   estradiol (ESTRACE) 0.1 MG/GM vaginal cream Place 0.5g nightly for two weeks then twice a week after 30 g 11   fluconazole (DIFLUCAN) 150 MG tablet Take 150 mg by mouth daily as needed.     folic acid (FOLVITE) 1 MG tablet Take 1 mg by mouth daily.     metFORMIN (GLUCOPHAGE-XR) 500 MG 24 hr tablet Take 2 tablets (1,000 mg total) by mouth daily before supper. 180 tablet 3   methotrexate (RHEUMATREX) 2.5 MG tablet Take by mouth.     nystatin ointment (MYCOSTATIN) Apply 1 Application topically 2 (two) times daily. Apply to affected area for up to 7 days. 30 g 0   ondansetron (ZOFRAN) 8 MG tablet Take 1 tablet (8 mg total) by mouth every 8 (eight) hours as needed for nausea or vomiting. 60 tablet 0   predniSONE (DELTASONE) 5 MG tablet Take 10 mg by mouth daily.     timolol (TIMOPTIC) 0.5 % ophthalmic solution 1 drop 2 (two) times daily.     tirzepatide [facility]) 2.5 MG/0.5ML Pen Inject 2.5 mg into the skin once a week. 2 mL 6   Trospium Chloride 60 MG CP24 Take 1 capsule (60 mg total) by mouth daily. 90 capsule 3   valACYclovir (VALTREX) 1000 MG tablet Take 2 Tablets By Mouth Every 12 Hours for 24 hours as needed for an outbreak. 30 tablet 1   cephALEXin (KEFLEX) 500 MG capsule Take 500 mg by mouth 2 (two) times daily. (Patient not taking: Reported on 10/02/2022)     No facility-administered medications prior to visit.    Review of Systems  Constitutional:  Negative for chills, fever, malaise/fatigue and weight loss.  HENT:  Negative for hearing loss, sore throat and tinnitus.    Eyes:  Negative for blurred vision and double vision.  Respiratory:  Positive for shortness of breath. Negative for cough, hemoptysis, sputum production, wheezing and stridor.   Cardiovascular:  Negative for chest pain, palpitations, orthopnea, leg swelling and PND.  Gastrointestinal:  Negative for abdominal pain, constipation, diarrhea, heartburn, nausea and vomiting.  Genitourinary:  Negative for dysuria, hematuria and urgency.  Musculoskeletal:  Negative for joint pain and myalgias.  Skin:  Negative for itching and rash.  Neurological:  Negative for dizziness, tingling, weakness and headaches.  Endo/Heme/Allergies:  Negative for environmental allergies. Does not bruise/bleed easily.  Psychiatric/Behavioral:  Negative for depression. The patient is not nervous/anxious and does not have insomnia.   All other systems reviewed and are negative.    Objective:  Physical Exam Vitals reviewed.  Constitutional:      General: She is not in acute distress.    Appearance: She is well-developed. She is obese.  HENT:     Head: Normocephalic and atraumatic.  Eyes:     General: No scleral icterus.    Conjunctiva/sclera: Conjunctivae normal.     Pupils: Pupils are equal, round, and reactive to light.  Neck:     Vascular: No JVD.     Trachea: No tracheal deviation.  Cardiovascular:     Rate and Rhythm: Normal rate and regular rhythm.     Heart sounds: Normal heart sounds. No murmur heard. Pulmonary:     Effort: Pulmonary effort is normal. No tachypnea, accessory muscle usage or respiratory distress.     Breath sounds: No stridor. No wheezing, rhonchi or rales.  Abdominal:     General: There is no distension.     Palpations: Abdomen is soft.     Tenderness: There is no abdominal tenderness.  Musculoskeletal:        General: No tenderness.     Cervical back: Neck supple.  Lymphadenopathy:     Cervical: No cervical adenopathy.  Skin:    General: Skin is warm and dry.     Capillary  Refill: Capillary refill takes less than 2 seconds.     Findings: No rash.  Neurological:     Mental Status: She is alert and oriented to person, place, and time.  Psychiatric:        Behavior: Behavior normal.      Vitals:   10/02/22 0921  BP: (!) 140/80  Pulse: 93  SpO2: 97%  Weight: 195 lb 9.6 oz (88.7 kg)  Height: 5\' 1"  (1.549 m)   97% on RA BMI Readings from Last 3 Encounters:  10/02/22 36.96 kg/m  10/02/22 37.03 kg/m  09/25/22 36.73 kg/m   Wt Readings from Last 3 Encounters:  10/02/22 195 lb 9.6 oz (88.7 kg)  10/02/22 196 lb (88.9 kg)  09/25/22 196 lb (88.9 kg)     CBC    Component Value Date/Time   WBC 9.5 05/19/2022 1527   WBC 9.9 01/16/2022 0730   RBC 4.79 05/19/2022 1527   HGB 14.3 06/05/2022 0912   HGB 14.5 05/19/2022 1527   HGB 14.5 12/15/2019 1605   HCT 42.0 06/05/2022 0912   HCT 41.8 12/15/2019 1605   PLT 357 05/19/2022 1527   PLT 319 12/15/2019 1605   MCV 88.3 05/19/2022 1527   MCV 88 12/15/2019 1605   MCH 30.3 05/19/2022 1527   MCHC 34.3 05/19/2022 1527   RDW 13.2 05/19/2022 1527   RDW 12.5 12/15/2019 1605   LYMPHSABS 1.7 05/19/2022 1527   LYMPHSABS 1.6 12/15/2019 1605   MONOABS 0.9 05/19/2022 1527   EOSABS 0.3 05/19/2022 1527   EOSABS 0.3 12/15/2019 1605   BASOSABS 0.1 05/19/2022 1527   BASOSABS 0.1 12/15/2019 1605     Chest Imaging:  Patient has CT chest March 2024: Stable 5 mm left upper lobe pulmonary nodule. Nuclear medicine pet imaging in December 2023 which reveals hypermetabolic adenopathy and splenic involvement consistent with sarcoid. The patient's images have been independently reviewed by  me.    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 Correlation with clinical and radiological findings is recommended.   Echocardiogram:   Heart Catheterization:      Assessment & Plan:     ICD-10-CM   1. Sarcoidosis  D86.9 Pulmonary Function Test    ECHOCARDIOGRAM COMPLETE    2. Other fatigue  R53.83       Discussion:  This is a 57 year old female with new diagnosis of sarcoidosis involvement of the bilateral hilum mediastinum spleen and lymph nodes.  She was started on methotrexate and prednisone.  Plan: She needs an echocardiogram as well as pulmonary function test. Pending the echocardiogram results could consider cardiac MRI. As this would change her timeframe and potential medication choices for sarcoid immune suppression. I will have her follow-up to see Dr. [name] in sarcoid clinic.  Hopefully can return to see her in approximately 6 weeks with test complete.   Current Outpatient Medications:    amphetamine-dextroamphetamine (ADDERALL) 10 MG tablet, Take 10 mg by mouth 2 (two) times daily., Disp: , Rfl:    clobetasol ointment (TEMOVATE) 0.05 %, Apply 1 Application topically 2 (two) times daily. Use for 2 weeks at a time for a flare.  You may use the cream twice a week at bedtime for maintenance dosing., Disp: 60 g, Rfl: 1   estradiol (ESTRACE) 0.1 MG/GM vaginal cream, Place 0.5g nightly for two weeks then twice a week after, Disp: 30 g, Rfl: 11   fluconazole (DIFLUCAN) 150 MG tablet, Take 150 mg by mouth daily as needed., Disp: , Rfl:    folic acid (FOLVITE) 1 MG tablet, Take 1 mg by mouth daily., Disp: , Rfl:    metFORMIN (GLUCOPHAGE-XR) 500 MG 24 hr tablet, Take 2 tablets (1,000 mg total) by mouth daily before supper., Disp: 180 tablet, Rfl: 3   methotrexate (RHEUMATREX) 2.5 MG tablet, Take by mouth., Disp: , Rfl:    nystatin ointment (MYCOSTATIN), Apply 1 Application topically 2 (two) times daily. Apply to affected area for up to 7 days., Disp: 30 g, Rfl: 0   ondansetron (ZOFRAN) 8 MG tablet, Take 1 tablet (8 mg total) by mouth every 8 (eight) hours as needed for nausea or vomiting., Disp: 60 tablet, Rfl: 0   predniSONE  (DELTASONE) 5 MG tablet, Take 10 mg by mouth daily., Disp: , Rfl:    timolol (TIMOPTIC) 0.5 % ophthalmic solution, 1 drop 2 (two) times daily., Disp: , Rfl:    tirzepatide (MOUNJARO) 2.5 MG/0.5ML Pen, Inject 2.5 mg into the skin once a week., Disp: 2 mL, Rfl: 6   Trospium Chloride 60 MG CP24, Take 1 capsule (60 mg total) by mouth daily., Disp: 90 capsule, Rfl: 3   valACYclovir (VALTREX) 1000 MG tablet, Take 2 Tablets By Mouth Every 12 Hours for 24 hours as needed for an outbreak., Disp: 30 tablet, Rfl: 1   cephALEXin (KEFLEX) 500 MG capsule, Take 500 mg by mouth 2 (two) times daily. (Patient not taking: Reported on 10/02/2022), Disp: , Rfl:    Josephine Igo, DO [facility] 10/02/2022 10:10 AM

## 2022-10-02 NOTE — Patient Instructions (Signed)
Thank you for visiting Dr. Tonia Brooms at [facility]. Today we recommend the following:  Orders Placed This Encounter  Procedures   ECHOCARDIOGRAM COMPLETE   Pulmonary Function Test   Return in about 6 weeks (around 11/13/2022) for w/ Dr. [name] , after PFTs , after Echo .    Please do your part to reduce the spread of COVID-19.

## 2022-10-02 NOTE — Patient Instructions (Signed)
It was a pleasure to see you today at our office.   Recommendations:  Neurocognitive evaluation at our office  Recommend CBT MRI brain Follow up in  57months     RECOMMENDATIONS FOR ALL PATIENTS WITH MEMORY PROBLEMS: 1. Continue to exercise (Recommend 30 minutes of walking everyday, or 3 hours every week) 2. Increase social interactions - continue going to [place] and enjoy social gatherings with friends and family 3. Eat healthy, avoid fried foods and eat more fruits and vegetables 4. Maintain adequate blood pressure, blood sugar, and blood cholesterol level. Reducing the risk of stroke and cardiovascular disease also helps promoting better memory. 5. Avoid stressful situations. Live a simple life and avoid aggravations. Organize your time and prepare for the next day in anticipation. 6. Sleep well, avoid any interruptions of sleep and avoid any distractions in the bedroom that may interfere with adequate sleep quality 7. Avoid sugar, avoid sweets as there is a strong link between excessive sugar intake, diabetes, and cognitive impairment We discussed the Mediterranean diet, which has been shown to help patients reduce the risk of progressive memory disorders and reduces cardiovascular risk. This includes eating fish, eat fruits and green leafy vegetables, nuts like almonds and hazelnuts, walnuts, and also use olive oil. Avoid fast foods and fried foods as much as possible. Avoid sweets and sugar as sugar use has been linked to worsening of memory function.  There is always a concern of gradual progression of memory problems. If this is the case, then we may need to adjust level of care according to patient needs. Support, both to the patient and caregiver, should then be put into place.      You have been referred for a neuropsychological evaluation (i.e., evaluation of memory and thinking abilities). Please bring someone with you to this appointment if possible, as it is helpful for the  doctor to hear from both you and another adult who knows you well. Please bring eyeglasses and hearing aids if you wear them.    The evaluation will take approximately 3 hours and has two parts:   The first part is a clinical interview with the neuropsychologist (Dr. Milbert Coulter or Dr. Roseanne Reno). During the interview, the neuropsychologist will speak with you and the individual you brought to the appointment.    The second part of the evaluation is testing with the doctor's technician Annabelle Harman or Selena Batten). During the testing, the technician will ask you to remember different types of material, solve problems, and answer some questionnaires. Your family member will not be present for this portion of the evaluation.   Please note: We must reserve several hours of the neuropsychologist's time and the psychometrician's time for your evaluation appointment. As such, there is a No-Show fee of $100. If you are unable to attend any of your appointments, please contact our office as soon as possible to reschedule.    FALL PRECAUTIONS: Be cautious when walking. Scan the area for obstacles that may increase the risk of trips and falls. When getting up in the mornings, sit up at the edge of the bed for a few minutes before getting out of bed. Consider elevating the bed at the head end to avoid drop of blood pressure when getting up. Walk always in a well-lit room (use night lights in the walls). Avoid area rugs or power cords from appliances in the middle of the walkways. Use a walker or a cane if necessary and consider physical therapy for balance exercise. Get your eyesight  checked regularly.  FINANCIAL OVERSIGHT: Supervision, especially oversight when making financial decisions or transactions is also recommended.  HOME SAFETY: Consider the safety of the kitchen when operating appliances like stoves, microwave oven, and blender. Consider having supervision and share cooking responsibilities until no longer able to  participate in those. Accidents with firearms and other hazards in the house should be identified and addressed as well.   ABILITY TO BE LEFT ALONE: If patient is unable to contact 911 operator, consider using LifeLine, or when the need is there, arrange for someone to stay with patients. Smoking is a fire hazard, consider supervision or cessation. Risk of wandering should be assessed by caregiver and if detected at any point, supervision and safe proof recommendations should be instituted.  MEDICATION SUPERVISION: Inability to self-administer medication needs to be constantly addressed. Implement a mechanism to ensure safe administration of the medications.   DRIVING: Regarding driving, in patients with progressive memory problems, driving will be impaired. We advise to have someone else do the driving if trouble finding directions or if minor accidents are reported. Independent driving assessment is available to determine safety of driving.   If you are interested in the driving assessment, you can contact the following:  [employer] in [place] [phone]  [facility] [phone]  [facility] [phone] or [phone]    Mediterranean Diet A Mediterranean diet refers to food and lifestyle choices that are based on the traditions of countries located on the [employer]. This way of eating has been shown to help prevent certain conditions and improve outcomes for people who have chronic diseases, like kidney disease and heart disease. What are tips for following this plan? Lifestyle  Cook and eat meals together with your family, when possible. Drink enough fluid to keep your urine clear or pale yellow. Be physically active every day. This includes: Aerobic exercise like running or swimming. Leisure activities like gardening, walking, or housework. Get 7-8 hours of sleep each night. If recommended  by your health care provider, drink red wine in moderation. This means 1 glass a day for nonpregnant women and 2 glasses a day for men. A glass of wine equals 5 oz (150 mL). Reading food labels  Check the serving size of packaged foods. For foods such as rice and pasta, the serving size refers to the amount of cooked product, not dry. Check the total fat in packaged foods. Avoid foods that have saturated fat or trans fats. Check the ingredients list for added sugars, such as corn syrup. Shopping  At the grocery store, buy most of your food from the areas near the walls of the store. This includes: Fresh fruits and vegetables (produce). Grains, beans, nuts, and seeds. Some of these may be available in unpackaged forms or large amounts (in bulk). Fresh seafood. Poultry and eggs. Low-fat dairy products. Buy whole ingredients instead of prepackaged foods. Buy fresh fruits and vegetables in-season from local farmers markets. Buy frozen fruits and vegetables in resealable bags. If you do not have access to quality fresh seafood, buy precooked frozen shrimp or canned fish, such as tuna, salmon, or sardines. Buy small amounts of raw or cooked vegetables, salads, or olives from the deli or salad bar at your store. Stock your pantry so you always have certain foods on hand, such as olive oil, canned tuna, canned tomatoes, rice, pasta, and beans. Cooking  Cook foods with extra-virgin olive oil instead of using butter or other vegetable  oils. Have meat as a side dish, and have vegetables or grains as your main dish. This means having meat in small portions or adding small amounts of meat to foods like pasta or stew. Use beans or vegetables instead of meat in common dishes like chili or lasagna. Experiment with different cooking methods. Try roasting or broiling vegetables instead of steaming or sauteing them. Add frozen vegetables to soups, stews, pasta, or rice. Add nuts or seeds for added healthy fat  at each meal. You can add these to yogurt, salads, or vegetable dishes. Marinate fish or vegetables using olive oil, lemon juice, garlic, and fresh herbs. Meal planning  Plan to eat 1 vegetarian meal one day each week. Try to work up to 2 vegetarian meals, if possible. Eat seafood 2 or more times a week. Have healthy snacks readily available, such as: Vegetable sticks with hummus. Greek yogurt. Fruit and nut trail mix. Eat balanced meals throughout the week. This includes: Fruit: 2-3 servings a day Vegetables: 4-5 servings a day Low-fat dairy: 2 servings a day Fish, poultry, or lean meat: 1 serving a day Beans and legumes: 2 or more servings a week Nuts and seeds: 1-2 servings a day Whole grains: 6-8 servings a day Extra-virgin olive oil: 3-4 servings a day Limit red meat and sweets to only a few servings a month What are my food choices? Mediterranean diet Recommended Grains: Whole-grain pasta. Brown rice. Bulgar wheat. Polenta. Couscous. Whole-wheat bread. Orpah Cobb. Vegetables: Artichokes. Beets. Broccoli. Cabbage. Carrots. Eggplant. Green beans. Chard. Kale. Spinach. Onions. Leeks. Peas. Squash. Tomatoes. Peppers. Radishes. Fruits: Apples. Apricots. Avocado. Berries. Bananas. Cherries. Dates. Figs. Grapes. Lemons. Melon. Oranges. Peaches. Plums. Pomegranate. Meats and other protein foods: Beans. Almonds. Sunflower seeds. Pine nuts. Peanuts. Cod. Salmon. Scallops. Shrimp. Tuna. Tilapia. Clams. Oysters. Eggs. Dairy: Low-fat milk. Cheese. Greek yogurt. Beverages: Water. Red wine. Herbal tea. Fats and oils: Extra virgin olive oil. Avocado oil. Grape seed oil. Sweets and desserts: [place] yogurt with honey. Baked apples. Poached pears. Trail mix. Seasoning and other foods: Basil. Cilantro. Coriander. Cumin. Mint. Parsley. Sage. Rosemary. Tarragon. Garlic. Oregano. Thyme. Pepper. Balsalmic vinegar. Tahini. Hummus. Tomato sauce. Olives. Mushrooms. Limit these Grains: Prepackaged  pasta or rice dishes. Prepackaged cereal with added sugar. Vegetables: Deep fried potatoes (french fries). Fruits: Fruit canned in syrup. Meats and other protein foods: Beef. Pork. Lamb. Poultry with skin. Hot dogs. Tomasa Blase. Dairy: Ice cream. Sour cream. Whole milk. Beverages: Juice. Sugar-sweetened soft drinks. Beer. Liquor and spirits. Fats and oils: Butter. Canola oil. Vegetable oil. Beef fat (tallow). Lard. Sweets and desserts: Cookies. Cakes. Pies. Candy. Seasoning and other foods: Mayonnaise. Premade sauces and marinades. The items listed may not be a complete list. Talk with your dietitian about what dietary choices are right for you. Summary The Mediterranean diet includes both food and lifestyle choices. Eat a variety of fresh fruits and vegetables, beans, nuts, seeds, and whole grains. Limit the amount of red meat and sweets that you eat. Talk with your health care provider about whether it is safe for you to drink red wine in moderation. This means 1 glass a day for nonpregnant women and 2 glasses a day for men. A glass of wine equals 5 oz (150 mL). This information is not intended to replace advice given to you by your health care provider. Make sure you discuss any questions you have with your health care provider. Document Released: 01/27/2016 Document Revised: 02/29/2016 Document Reviewed: 01/27/2016 Elsevier Interactive Patient Education  2017 [employer].
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
## 2022-10-09 ENCOUNTER — Telehealth (HOSPITAL_BASED_OUTPATIENT_CLINIC_OR_DEPARTMENT_OTHER): Payer: Self-pay | Admitting: Pulmonary Disease

## 2022-10-09 NOTE — Telephone Encounter (Signed)
Order is getting authorized and it states to have it done at [facility]

## 2022-10-09 NOTE — Telephone Encounter (Signed)
Patient requesting to have echo that was ordered by Dr. Tonia Brooms for her to be done at [facility] if possible when scheduling her. Please advise.  Provider used for telephone encounter incorrect FYI should have been Dr. Tonia Brooms.

## 2022-10-16 ENCOUNTER — Encounter: Payer: Self-pay | Admitting: Obstetrics and Gynecology

## 2022-10-18 ENCOUNTER — Ambulatory Visit: Payer: BC Managed Care – PPO | Admitting: Internal Medicine

## 2022-10-23 ENCOUNTER — Ambulatory Visit
Admission: RE | Admit: 2022-10-23 | Discharge: 2022-10-23 | Disposition: A | Discharge status: Home / Self Care | Payer: BC Managed Care – PPO | Source: Ambulatory Visit | Attending: Physician Assistant | Admitting: Physician Assistant

## 2022-10-23 MED ORDER — GADOPICLENOL 0.5 MMOL/ML IV SOLN
9.0000 mL | Freq: Once | INTRAVENOUS | Status: AC | PRN
  Administered 2022-10-23: 9 mL via INTRAVENOUS

## 2022-11-01 ENCOUNTER — Encounter: Payer: Self-pay | Admitting: Internal Medicine

## 2022-11-01 ENCOUNTER — Ambulatory Visit (INDEPENDENT_AMBULATORY_CARE_PROVIDER_SITE_OTHER): Payer: BC Managed Care – PPO | Admitting: Obstetrics and Gynecology

## 2022-11-01 ENCOUNTER — Encounter: Payer: Self-pay | Admitting: Obstetrics and Gynecology

## 2022-11-01 VITALS — BP 125/86 | HR 90

## 2022-11-01 DIAGNOSIS — N3281 Overactive bladder: Secondary | ICD-10-CM | POA: Diagnosis not present

## 2022-11-01 MED ORDER — VIBEGRON 75 MG PO TABS: 75.0000 mg | ORAL_TABLET | Freq: Every day | ORAL | 3 refills | Status: DC

## 2022-11-01 MED ORDER — TIRZEPATIDE 5 MG/0.5ML ~~LOC~~ SOAJ: 5.0000 mg | SUBCUTANEOUS | 3 refills | Status: DC

## 2022-11-01 NOTE — Progress Notes (Signed)
[facility] Return Visit  SUBJECTIVE  History of Present Illness: [name] is a 57 y.o. female seen in follow-up for OAB. Plan at last visit was take Trospium 60mg  ER daily.   She reports the urgency and frequency is more bothersome. Reports she is getting up once nightly to urinate.   Past Medical History: Patient  has a past medical history of Anxiety, Back pain, Constipation, Diabetes mellitus without complication (HCC), Fatty liver, Hypercholesteremia, Hypertension, Intestinal bacterial overgrowth, Joint pain, Kidney problem, Kidney stones, Lichen planus (2018), Migraines, Nausea in adult, S/P breast biopsy (04/06/2021), Sarcoidosis, Shortness of breath, Stress, and Subclinical hypothyroidism (09/2009).   Past Surgical History: She  has a past surgical history that includes Novasure ablation (12/18/2006); Lithotripsy; Rotator cuff repair (Right, 06/20/2007); Colonoscopy; Radioactive seed guided excisional breast biopsy (Right, 05/30/2021); Bladder suspension (N/A, 06/05/2022); Cystoscopy (N/A, 06/05/2022); and Lymph node dissection.   Medications: She has a current medication list which includes the following prescription(s): amphetamine-dextroamphetamine, cephalexin, clobetasol ointment, estradiol, fluconazole, folic acid, metformin, methotrexate, nystatin ointment, ondansetron, prednisone, timolol, tirzepatide, valacyclovir, and vibegron.   Allergies: Patient is allergic to tizanidine hcl and clarithromycin.   Social History: Patient  reports that she has never smoked. She has never been exposed to tobacco smoke. She has never used smokeless tobacco. She reports that she does not drink alcohol and does not use drugs.      OBJECTIVE     Physical Exam: Vitals:   11/01/22 1308  BP: 125/86  Pulse: 90   Gen: No apparent distress, A&O x 3.  Detailed Urogynecologic Evaluation:  Deferred.   ASSESSMENT AND PLAN    [name] is a 57 y.o. with:  1.  Overactive bladder    Patient's Trospium 60mg  ER daily was not working well for her. Will stop Trospium and start Vibegron 75mg  daily. This is reported to be covered under her healthcare plan and estimated to be 0$ for a 90 day supply. Discussed considering pelvic floor physical therapy. Also briefly discussed Tertiary therapies including Bladder botox, PTNS and SNM.   Patient to return in 8 weeks or sooner if needed.

## 2022-11-01 NOTE — Patient Instructions (Signed)
Start Vibegron 75mg  daily.

## 2022-11-07 ENCOUNTER — Other Ambulatory Visit: Payer: Self-pay | Admitting: Internal Medicine

## 2022-11-07 ENCOUNTER — Telehealth: Payer: Self-pay

## 2022-11-07 NOTE — Telephone Encounter (Signed)
Faxed received from [facility] that Mounjaro 5, 7.5 and 10mg  are all on back order.  Requesting and alternative.

## 2022-11-08 NOTE — Progress Notes (Deleted)
GYNECOLOGY  VISIT   HPI: 57 y.o.   Married  Caucasian  female   G2P2002 with No LMP recorded. Patient has had an ablation.   here for   recurring pain in vagina  GYNECOLOGIC HISTORY: No LMP recorded. Patient has had an ablation. Contraception:  ablation Menopausal hormone therapy:  estrace Last mammogram: 05/09/22 Breast Density Cat A, BI-RADS CAT 1 neg Last pap smear:   11/28/17-WNL, HPV- neg, 11/26/14-WNL, 06/04/08- WNL               OB History     Gravida  2   Para  2   Term  2   Preterm      AB      Living  2      SAB      IAB      Ectopic      Multiple      Live Births  2              Patient Active Problem List   Diagnosis Date Noted   Anxiety disorder, unspecified 09/25/2022   Hypertension 09/25/2022   Depression 09/25/2022   Type 2 diabetes mellitus (HCC) 09/25/2022   Sarcoidosis 09/25/2022   High risk medication use 09/25/2022   Lymphadenopathy 06/13/2022   Splenic lesion 05/20/2022   Type 2 diabetes mellitus without complication, without long-term current use of insulin (HCC) 05/05/2022   Other allergic rhinitis 04/04/2022   Pain in thoracic spine 07/18/2021   Lumbar pain 07/12/2021   Myalgia, unspecified site 06/21/2021   Contact dermatitis 02/11/2021   Chronic foot pain 10/08/2019   Sleep disturbance 10/08/2019   Elevated LFTs 12/12/2018   Mixed hyperlipidemia 12/12/2018   Fen-phen history 11/24/2018   Morbid obesity (HCC) 11/24/2018    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   Migraines    Nausea in adult    S/P breast biopsy 04/06/2021   right breast biopsy   Sarcoidosis    Shortness of breath    Stress    Subclinical hypothyroidism 09/2009    Past Surgical  History:  Procedure Laterality Date   BLADDER SUSPENSION N/A 06/05/2022   Procedure: TRANSVAGINAL TAPE (TVT) PROCEDURE;  Surgeon: Marguerita Beards, MD;  Location: [facility];  Service: Gynecology;  Laterality: N/A;  total time requested is 1 hour   COLONOSCOPY     CYSTOSCOPY N/A 06/05/2022   Procedure: CYSTOSCOPY;  Surgeon: Marguerita Beards, MD;  Location: [facility];  Service: Gynecology;  Laterality: N/A;   LITHOTRIPSY     LYMPH NODE DISSECTION     NOVASURE ABLATION  12/18/2006   RADIOACTIVE SEED GUIDED EXCISIONAL BREAST BIOPSY Right 05/30/2021   Procedure: RADIOACTIVE SEED GUIDED EXCISIONAL RIGHT BREAST BIOPSY;  Surgeon: Emelia Loron, MD;  Location: [facility];  Service: General;  Laterality: Right;   ROTATOR CUFF REPAIR Right 06/20/2007    Current Outpatient Medications  Medication Sig Dispense Refill   amphetamine-dextroamphetamine (ADDERALL) 10 MG tablet Take 10 mg by mouth 2 (two) times daily.     cephALEXin (KEFLEX) 500 MG capsule Take 500 mg by mouth 2 (two)  times daily.     clobetasol ointment (TEMOVATE) 0.05 % Apply 1 Application topically 2 (two) times daily. Use for 2 weeks at a time for a flare.  You may use the cream twice a week at bedtime for maintenance dosing. 60 g 1   estradiol (ESTRACE) 0.1 MG/GM vaginal cream Place 0.5g nightly for two weeks then twice a week after 30 g 11   fluconazole (DIFLUCAN) 150 MG tablet Take 150 mg by mouth daily as needed.     folic acid (FOLVITE) 1 MG tablet Take 1 mg by mouth daily.     metFORMIN (GLUCOPHAGE-XR) 500 MG 24 hr tablet Take 2 tablets (1,000 mg total) by mouth daily before supper. 180 tablet 3   methotrexate (RHEUMATREX) 2.5 MG tablet Take by mouth.     nystatin ointment (MYCOSTATIN) Apply 1 Application topically 2 (two) times daily. Apply to affected area for up to 7 days. 30 g 0   ondansetron (ZOFRAN) 8 MG tablet Take 1 tablet (8 mg total) by mouth every 8 (eight)  hours as needed for nausea or vomiting. 60 tablet 0   predniSONE (DELTASONE) 5 MG tablet Take 10 mg by mouth daily.     timolol (TIMOPTIC) 0.5 % ophthalmic solution 1 drop 2 (two) times daily.     tirzepatide [facility]) 5 MG/0.5ML Pen Inject 5 mg into the skin once a week. 6 mL 3   valACYclovir (VALTREX) 1000 MG tablet Take 2 Tablets By Mouth Every 12 Hours for 24 hours as needed for an outbreak. 30 tablet 1   Vibegron 75 MG TABS Take 1 tablet (75 mg total) by mouth daily. 90 tablet 3   No current facility-administered medications for this visit.     ALLERGIES: Tizanidine hcl and Clarithromycin  Family History  Problem Relation Age of Onset   Diabetes Mother    Hyperlipidemia Mother    Thyroid disease Mother    Anxiety disorder Mother    Obesity Mother    Aortic stenosis Mother    Arthritis-Osteo Sister    Colon cancer Paternal Grandmother    Heart disease Paternal Grandfather    Pancreatic cancer Neg Hx    Stomach cancer Neg Hx    Liver disease Neg Hx    Esophageal cancer Neg Hx     Social History   Socioeconomic History   Marital status: Married    Spouse name: Alinda Money   Number of children: 2   Years of education: Not on file   Highest education level: Not on file  Occupational History   Occupation: Hydrologist   Occupation: Chemical engineer  Tobacco Use   Smoking status: Never    Passive exposure: Never   Smokeless tobacco: Never  Vaping Use   Vaping Use: Never used  Substance and Sexual Activity   Alcohol use: No    Alcohol/week: 0.0 standard drinks of alcohol   Drug use: No   Sexual activity: Yes    Partners: Male    Birth control/protection: Surgical    Comment: Ablation, Vasectomy  Other Topics Concern   Not on file  Social History Narrative   Right handed   Drinks coffee   Lives with husband   Two story home   unemployed   Social Determinants of Health   Financial Resource Strain: Not on file  Food  Insecurity: Not on file  Transportation Needs: Not on file  Physical Activity: Not on file  Stress: Not on file  Social Connections: Not on  file  Intimate Partner Violence: Not on file    Review of Systems  PHYSICAL EXAMINATION:    There were no vitals taken for this visit.    General appearance: alert, cooperative and appears stated age Head: Normocephalic, without obvious abnormality, atraumatic Neck: no adenopathy, supple, symmetrical, trachea midline and thyroid normal to inspection and palpation Lungs: clear to auscultation bilaterally Breasts: normal appearance, no masses or tenderness, No nipple retraction or dimpling, No nipple discharge or bleeding, No axillary or supraclavicular adenopathy Heart: regular rate and rhythm Abdomen: soft, non-tender, no masses,  no organomegaly Extremities: extremities normal, atraumatic, no cyanosis or edema Skin: Skin color, texture, turgor normal. No rashes or lesions Lymph nodes: Cervical, supraclavicular, and axillary nodes normal. No abnormal inguinal nodes palpated Neurologic: Grossly normal  Pelvic: External genitalia:  no lesions              Urethra:  normal appearing urethra with no masses, tenderness or lesions              Bartholins and Skenes: normal                 Vagina: normal appearing vagina with normal color and discharge, no lesions              Cervix: no lesions                Bimanual Exam:  Uterus:  normal size, contour, position, consistency, mobility, non-tender              Adnexa: no mass, fullness, tenderness              Rectal exam: {yes no:[id number]}.  Confirms.              Anus:  normal sphincter tone, no lesions  Chaperone was present for exam:  ***  ASSESSMENT     PLAN     An After Visit Summary was printed and given to the patient.  ______ minutes face to face time of which over 50% was spent in counseling.

## 2022-11-16 ENCOUNTER — Ambulatory Visit (INDEPENDENT_AMBULATORY_CARE_PROVIDER_SITE_OTHER): Payer: BC Managed Care – PPO

## 2022-11-16 DIAGNOSIS — D869 Sarcoidosis, unspecified: Secondary | ICD-10-CM

## 2022-11-17 ENCOUNTER — Ambulatory Visit (INDEPENDENT_AMBULATORY_CARE_PROVIDER_SITE_OTHER): Payer: BC Managed Care – PPO | Admitting: Pulmonary Disease

## 2022-11-17 DIAGNOSIS — D869 Sarcoidosis, unspecified: Secondary | ICD-10-CM | POA: Diagnosis not present

## 2022-11-17 LAB — PULMONARY FUNCTION TEST
DL/VA % pred: 118 %
DL/VA: 5.12 ml/min/mmHg/L
DLCO cor % pred: 102 %
DLCO cor: 19.26 ml/min/mmHg
DLCO unc % pred: 102 %
DLCO unc: 19.26 ml/min/mmHg
FEF 25-75 Post: 2.58 L/sec
FEF 25-75 Pre: 2.66 L/sec
FEF2575-%Change-Post: -3 %
FEF2575-%Pred-Post: 108 %
FEF2575-%Pred-Pre: 111 %
FEV1-%Change-Post: 2 %
FEV1-%Pred-Post: 86 %
FEV1-%Pred-Pre: 85 %
FEV1-Post: 2.11 L
FEV1-Pre: 2.07 L
FEV1FVC-%Change-Post: 0 %
FEV1FVC-%Pred-Pre: 108 %
FEV6-%Change-Post: 1 %
FEV6-%Pred-Post: 81 %
FEV6-%Pred-Pre: 79 %
FEV6-Post: 2.44 L
FEV6-Pre: 2.4 L
FEV6FVC-%Change-Post: 0 %
FEV6FVC-%Pred-Post: 103 %
FEV6FVC-%Pred-Pre: 103 %
FVC-%Change-Post: 1 %
FVC-%Pred-Post: 78 %
FVC-%Pred-Pre: 77 %
FVC-Post: 2.44 L
FVC-Pre: 2.41 L
Post FEV1/FVC ratio: 87 %
Post FEV6/FVC ratio: 100 %
Pre FEV1/FVC ratio: 86 %
Pre FEV6/FVC Ratio: 100 %
RV % pred: 96 %
RV: 1.71 L
TLC % pred: 88 %
TLC: 4.15 L

## 2022-11-17 LAB — ECHOCARDIOGRAM COMPLETE: Area-P 1/2: 3.77 cm2

## 2022-11-17 NOTE — Progress Notes (Signed)
Echo appears normal. Please keep follow up appt with Dr. [name] on June 10th   Thanks,  BLI  Josephine Igo, DO [facility] 11/17/2022 11:43 AM

## 2022-11-17 NOTE — Progress Notes (Signed)
Full PFT performed today. °

## 2022-11-17 NOTE — Patient Instructions (Signed)
Full PFT performed today. °

## 2022-11-22 ENCOUNTER — Ambulatory Visit: Payer: BC Managed Care – PPO | Admitting: Obstetrics and Gynecology

## 2022-11-27 ENCOUNTER — Encounter (HOSPITAL_BASED_OUTPATIENT_CLINIC_OR_DEPARTMENT_OTHER): Payer: BC Managed Care – PPO

## 2022-11-27 ENCOUNTER — Encounter (HOSPITAL_BASED_OUTPATIENT_CLINIC_OR_DEPARTMENT_OTHER): Payer: Self-pay | Admitting: Pulmonary Disease

## 2022-11-27 ENCOUNTER — Ambulatory Visit (INDEPENDENT_AMBULATORY_CARE_PROVIDER_SITE_OTHER): Payer: BC Managed Care – PPO | Admitting: Pulmonary Disease

## 2022-11-27 VITALS — BP 122/88 | HR 100 | Temp 97.9°F | Ht 61.5 in | Wt 192.0 lb

## 2022-11-27 DIAGNOSIS — G4733 Obstructive sleep apnea (adult) (pediatric): Secondary | ICD-10-CM

## 2022-11-27 DIAGNOSIS — D869 Sarcoidosis, unspecified: Secondary | ICD-10-CM | POA: Diagnosis not present

## 2022-11-27 MED ORDER — PREDNISONE 5 MG PO TABS: 5.0000 mg | ORAL_TABLET | Freq: Every day | ORAL | 2 refills | Status: DC

## 2022-11-27 MED ORDER — OMEPRAZOLE 20 MG PO CPDR: 20.0000 mg | DELAYED_RELEASE_CAPSULE | Freq: Every day | ORAL | 5 refills | Status: DC

## 2022-11-27 MED ORDER — FOLIC ACID 1 MG PO TABS: 2.0000 mg | ORAL_TABLET | Freq: Every day | ORAL | 11 refills | Status: DC

## 2022-11-27 MED ORDER — BREZTRI AEROSPHERE 160-9-4.8 MCG/ACT IN AERO: 2.0000 | INHALATION_SPRAY | Freq: Two times a day (BID) | RESPIRATORY_TRACT | 0 refills | Status: DC

## 2022-11-27 MED ORDER — METHOTREXATE SODIUM 2.5 MG PO TABS: 20.0000 mg | ORAL_TABLET | ORAL | 1 refills | Status: DC

## 2022-11-27 NOTE — Patient Instructions (Addendum)
Sarcoid High risk medication management --Continue methotrexate 20 mg weekly --Reduce prednisone to 5 mg daily with plan to wean in 1-2 months. Take omeprazole 20 mg daily with this --ORDER labs as noted below --Plan to reassess with PET in 6 months --Refer to Pulmonary Rehab  History of OSA Fatigue, excessive daytime sleepiness --ORDER overnight oximetry on room air. Please call our office after test is complete so we can retrieve results --If abnormal, will need sleep study  Brain fog Mild gait --Refer to [facility]oid clinic

## 2022-11-27 NOTE — Progress Notes (Signed)
Synopsis: Referred in April 2024 for abnormal PET scan, adenopathy by Fatima Sanger, FNP  Subjective:   PATIENT ID: [name] GENDER: female DOB: Jan 03, 1966, MRN: [id number]  Chief Complaint  Patient presents with   Follow-up    New patient. Here to talk about sarcoid.    Synopsis: This is a 57 year old female, past medical history of back pain, constipation, type 2 diabetes, hypertension, kidney stones.  Presents with a new diagnosis of sarcoidosis after a inguinal node excision that shows not nonnecrotizing granulomas.  She has abnormal pet imaging that shows mediastinal and hilar adenopathy as well as splenic involvement. She reports that she has had difficulty concentrating, fatigue and brain fog for over a year.  She was started on prednisone and methotrexate by rheumatology.  She has had an eye exam and there is no evidence of uveitis.     11/27/22 She is a new patient to me to establish care for sarcoid management. She has been on methotrexate 20 mg weekly since 10/22/22. This was started by Rheumatology. Currently still on prednisone 10 mg, has been this medication since March 2024. Feels fatigued prior to her weekly dosing of methotrexate on Sunday and then will feel worse after taking it on Mondays. She currently is followed by GI for belching and nausea and has had negative work-up. She continues to have shortness of breath with exertion. She reports brain fog and balance issues sometimes. Denies falling. Will need to hold onto things due to floating sensation at times.   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   Migraines    Nausea in adult    S/P breast biopsy 04/06/2021   right breast biopsy   Sarcoidosis     Shortness of breath    Stress    Subclinical hypothyroidism 09/2009     Family History  Problem Relation Age of Onset   Diabetes Mother    Hyperlipidemia Mother    Thyroid disease Mother    Anxiety disorder Mother    Obesity Mother    Aortic stenosis Mother    Arthritis-Osteo Sister    Colon cancer Paternal Grandmother    Heart disease Paternal Grandfather    Pancreatic cancer Neg Hx    Stomach cancer Neg Hx    Liver disease Neg Hx    Esophageal cancer Neg Hx      Past Surgical History:  Procedure Laterality Date   BLADDER SUSPENSION N/A 06/05/2022   Procedure: TRANSVAGINAL TAPE (TVT) PROCEDURE;  Surgeon: Marguerita Beards, MD;  Location: [facility];  Service: Gynecology;  Laterality: N/A;  total time requested is 1 hour   COLONOSCOPY     CYSTOSCOPY N/A 06/05/2022   Procedure: CYSTOSCOPY;  Surgeon: Marguerita Beards, MD;  Location: [facility];  Service: Gynecology;  Laterality: N/A;   LITHOTRIPSY     LYMPH NODE DISSECTION     NOVASURE ABLATION  12/18/2006   RADIOACTIVE SEED GUIDED EXCISIONAL BREAST BIOPSY Right 05/30/2021   Procedure: RADIOACTIVE SEED GUIDED EXCISIONAL RIGHT BREAST BIOPSY;  Surgeon: Emelia Loron, MD;  Location:  [facility];  Service: General;  Laterality:  Right;   ROTATOR CUFF REPAIR Right 06/20/2007    Social History   Socioeconomic History   Marital status: Married    Spouse name: Alinda Money   Number of children: 2   Years of education: Not on file   Highest education level: Not on file  Occupational History   Occupation: Hydrologist   Occupation: Chemical engineer  Tobacco Use   Smoking status: Never    Passive exposure: Never   Smokeless tobacco: Never  Vaping Use   Vaping Use: Never used  Substance and Sexual Activity   Alcohol use: No    Alcohol/week: 0.0 standard drinks of alcohol   Drug use: No   Sexual activity: Yes    Partners: Male     Birth control/protection: Surgical    Comment: Ablation, Vasectomy  Other Topics Concern   Not on file  Social History Narrative   Right handed   Drinks coffee   Lives with husband   Two story home   unemployed   Social Determinants of Health   Financial Resource Strain: Not on file  Food Insecurity: Not on file  Transportation Needs: Not on file  Physical Activity: Not on file  Stress: Not on file  Social Connections: Not on file  Intimate Partner Violence: Not on file     Allergies  Allergen Reactions   Tizanidine Hcl Other (See Comments)    Cant move Cant move   Clarithromycin Nausea Only    Severe stomach cramps Severe stomach cramps     Outpatient Medications Prior to Visit  Medication Sig Dispense Refill   clobetasol ointment (TEMOVATE) 0.05 % Apply 1 Application topically 2 (two) times daily. Use for 2 weeks at a time for a flare.  You may use the cream twice a week at bedtime for maintenance dosing. 60 g 1   fluconazole (DIFLUCAN) 150 MG tablet Take 150 mg by mouth daily as needed.     metFORMIN (GLUCOPHAGE-XR) 500 MG 24 hr tablet Take 2 tablets (1,000 mg total) by mouth daily before supper. 180 tablet 3   tirzepatide (MOUNJARO) 5 MG/0.5ML Pen Inject 5 mg into the skin once a week. 6 mL 3   valACYclovir (VALTREX) 1000 MG tablet Take 2 Tablets By Mouth Every 12 Hours for 24 hours as needed for an outbreak. 30 tablet 1   Vibegron 75 MG TABS Take 1 tablet (75 mg total) by mouth daily. 90 tablet 3   folic acid (FOLVITE) 1 MG tablet Take 1 mg by mouth daily.     methotrexate (RHEUMATREX) 2.5 MG tablet Take by mouth.     amphetamine-dextroamphetamine (ADDERALL) 10 MG tablet Take 10 mg by mouth 2 (two) times daily.     cephALEXin (KEFLEX) 500 MG capsule Take 500 mg by mouth 2 (two) times daily.     estradiol (ESTRACE) 0.1 MG/GM vaginal cream Place 0.5g nightly for two weeks then twice a week after 30 g 11   nystatin ointment (MYCOSTATIN) Apply 1 Application topically 2  (two) times daily. Apply to affected area for up to 7 days. 30 g 0   ondansetron (ZOFRAN) 8 MG tablet Take 1 tablet (8 mg total) by mouth every 8 (eight) hours as needed for nausea or vomiting. 60 tablet 0   predniSONE (DELTASONE) 5 MG tablet Take 10 mg by mouth daily.     timolol (TIMOPTIC) 0.5 % ophthalmic solution 1 drop 2 (two) times daily.     No facility-administered medications prior to visit.  Review of Systems  Constitutional:  Positive for malaise/fatigue. Negative for chills, diaphoresis, fever and weight loss.  HENT:  Negative for congestion.   Respiratory:  Positive for shortness of breath. Negative for cough, hemoptysis, sputum production and wheezing.   Cardiovascular:  Negative for chest pain, palpitations and leg swelling.  Gastrointestinal:  Positive for abdominal pain and nausea.  Musculoskeletal:  Positive for back pain.  Neurological:  Positive for dizziness and weakness.     Objective:   Vitals:   11/27/22 0947  BP: 122/88  Pulse: 100  Temp: 97.9 F (36.6 C)  TempSrc: Oral  SpO2: 100%  Weight: 192 lb (87.1 kg)  Height: 5' 1.5" (1.562 m)   100% on RA BMI Readings from Last 3 Encounters:  11/27/22 35.69 kg/m  10/02/22 36.96 kg/m  10/02/22 37.03 kg/m   Wt Readings from Last 3 Encounters:  11/27/22 192 lb (87.1 kg)  10/02/22 195 lb 9.6 oz (88.7 kg)  10/02/22 196 lb (88.9 kg)   Physical Exam: General: Well-appearing, no acute distress HENT: [place], AT Eyes: EOMI, no scleral icterus Respiratory: Clear to auscultation bilaterally.  No crackles, wheezing or rales Cardiovascular: RRR, -M/R/G, no JVD Extremities:-Edema,-tenderness Neuro: AAO x4, CNII-XII grossly intact Psych: Normal mood, normal affect  Data Reviewed:  Labs:    Latest Ref Rng & Units 06/05/2022    9:12 AM 05/19/2022    3:27 PM 01/16/2022    7:30 AM  CBC  WBC 4.0 - 10.5 K/uL  9.5  9.9   Hemoglobin 12.0 - 15.0 g/dL 09.8  11.9  14.7   Hematocrit 36.0 - 46.0 % 42.0  42.3  38.9    Platelets 150 - 400 K/uL  357  303.0       Latest Ref Rng & Units 06/05/2022    9:12 AM 05/19/2022    3:27 PM 01/16/2022    7:30 AM  CMP  Glucose 70 - 99 mg/dL 829  94  562   BUN 6 - 20 mg/dL 9  15  14    Creatinine 0.44 - 1.00 mg/dL 1.30  8.65  7.84   Sodium 135 - 145 mmol/L 140  138  135   Potassium 3.5 - 5.1 mmol/L 3.9  3.7  4.1   Chloride 98 - 111 mmol/L 100  101  96   CO2 22 - 32 mmol/L  29  29   Calcium 8.9 - 10.3 mg/dL  69.6  9.3   Total Protein 6.5 - 8.1 g/dL  7.8  7.2   Total Bilirubin 0.3 - 1.2 mg/dL  0.4  0.5   Alkaline Phos 38 - 126 U/L  79  88   AST 15 - 41 U/L  31  38   ALT 0 - 44 U/L  34  45     Labs from 10/12/22 OSH provided by patient on phone: CBC with diff, CMET, TSH - overall within normal limits except for hyperglycemia  Chest Imaging:  Patient has CT chest March 2024: Stable 5 mm left upper lobe pulmonary nodule. Nuclear medicine pet imaging in December 2023 which reveals hypermetabolic adenopathy and splenic involvement consistent with sarcoid.  Pulmonary Functions Testing Results:    Latest Ref Rng & Units 11/17/2022    8:44 AM  PFT Results  FVC-Pre L 2.41   FVC-Predicted Pre % 77   FVC-Post L 2.44   FVC-Predicted Post % 78   Pre FEV1/FVC % % 86   Post FEV1/FCV % % 87   FEV1-Pre L 2.07  FEV1-Predicted Pre % 85   FEV1-Post L 2.11   DLCO uncorrected ml/min/mmHg 19.26   DLCO UNC% % 102   DLCO corrected ml/min/mmHg 19.26   DLCO COR %Predicted % 102   DLVA Predicted % 118   TLC L 4.15   TLC % Predicted % 88   RV % Predicted % 96   11/17/22 - Interpretation: Normal PFTs. 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 Correlation with clinical and radiological findings is recommended.   Echocardiogram:  11/16/22 - Normal EF.      Assessment & Plan:     ICD-10-CM   1. Sarcoidosis  D86.9 AMB referral to pulmonary  rehabilitation    CBC With Differential    Comprehensive metabolic panel    QuantiFERON-TB Gold Plus    Ambulatory referral to Neurology    2. OSA (obstructive sleep apnea)  G47.33 Pulse oximetry, overnight      Discussion: We discussed the clinical course of sarcoid and management including serial PFTs, labs, eye exam, and EKG and chest imaging if indicated. Currently on methotrexate and tolerating medications however does feel worse the day after taking meds.  Adverse effects of steroids discussed including bruising, weight gain, fluid retention, Cushingoid features, hypertension, cardiac arrhythmias, osteoporosis, gastric ulcers, increased risk of infection, insomnia and hyperglycemia.  Reviewed risks and benefits of methotrexate therapy. Possible adverse effects including but not limited to increased risk of infection, liver toxicity, bone marrow suppression, pneumonitis and oral ulcers.  Sarcoid with deconditioning High risk medication management --Continue methotrexate 20 mg weekly --Reduce prednisone to 5 mg daily with plan to wean in 1-2 months. Take omeprazole 20 mg daily with this --ORDER labs as noted below --Plan to reassess with PET in 6 months --Sarcoid handout provided  Shortness of breath --PFTs and echo normal --Will trial bronchodilators for clinical benefit. Low threshold to discontinue to ineffective --Breztri ONE puff twice a day sample given  History of OSA Fatigue, excessive daytime sleepiness --ORDER overnight oximetry on room air. Please call our office after test is complete so we can retrieve results --If abnormal, will need sleep study  Brain fog --Refer to [facility] Neurosarcoid clinic [facility] Champ Mungo, MD @ [facility])  Current Outpatient Medications:    clobetasol ointment (TEMOVATE) 0.05 %, Apply 1 Application topically 2 (two) times daily. Use for 2 weeks at a time for a flare.  You may use the cream twice a week at bedtime for maintenance dosing.,  Disp: 60 g, Rfl: 1   fluconazole (DIFLUCAN) 150 MG tablet, Take 150 mg by mouth daily as needed., Disp: , Rfl:    folic acid (FOLVITE) 1 MG tablet, Take 2 tablets (2 mg total) by mouth daily., Disp: 180 tablet, Rfl: 11   metFORMIN (GLUCOPHAGE-XR) 500 MG 24 hr tablet, Take 2 tablets (1,000 mg total) by mouth daily before supper., Disp: 180 tablet, Rfl: 3   omeprazole (PRILOSEC) 20 MG capsule, Take 1 capsule (20 mg total) by mouth daily., Disp: 90 capsule, Rfl: 5   predniSONE (DELTASONE) 5 MG tablet, Take 1 tablet (5 mg total) by mouth daily with breakfast., Disp: 30 tablet, Rfl: 2   tirzepatide (MOUNJARO) 5 MG/0.5ML Pen, Inject 5 mg into the skin once a week., Disp: 6 mL, Rfl: 3   valACYclovir (VALTREX) 1000 MG tablet, Take 2 Tablets By Mouth Every 12 Hours for 24 hours as needed for an outbreak., Disp: 30 tablet, Rfl:  1   Vibegron 75 MG TABS, Take 1 tablet (75 mg total) by mouth daily., Disp: 90 tablet, Rfl: 3   methotrexate (RHEUMATREX) 2.5 MG tablet, Take 8 tablets (20 mg total) by mouth once a week., Disp: 96 tablet, Rfl: 1  I have spent a total time of 60-minutes on the day of the appointment including chart review, data review, collecting history, coordinating care and discussing medical diagnosis and plan with the patient/family. Past medical history, allergies, medications were reviewed. Pertinent imaging, labs and tests included in this note have been reviewed and interpreted independently by me.  Caton Popowski Mechele Collin, MD [facility] 11/27/2022 11:01 AM

## 2022-11-27 NOTE — Addendum Note (Signed)
Addended by: Arlyss Repress on: 11/27/2022 11:07 AM   Modules accepted: Orders

## 2022-11-28 ENCOUNTER — Encounter (HOSPITAL_BASED_OUTPATIENT_CLINIC_OR_DEPARTMENT_OTHER): Payer: Self-pay | Admitting: Pulmonary Disease

## 2022-11-29 ENCOUNTER — Encounter: Payer: Self-pay | Admitting: Obstetrics and Gynecology

## 2022-11-29 ENCOUNTER — Telehealth (HOSPITAL_COMMUNITY): Payer: Self-pay

## 2022-11-29 ENCOUNTER — Telehealth: Payer: Self-pay | Admitting: Obstetrics and Gynecology

## 2022-11-29 ENCOUNTER — Ambulatory Visit: Payer: BC Managed Care – PPO | Admitting: Obstetrics and Gynecology

## 2022-11-29 VITALS — BP 122/78 | HR 95 | Ht 61.0 in | Wt 191.0 lb

## 2022-11-29 DIAGNOSIS — N9089 Other specified noninflammatory disorders of vulva and perineum: Secondary | ICD-10-CM | POA: Diagnosis not present

## 2022-11-29 DIAGNOSIS — R102 Pelvic and perineal pain unspecified side: Secondary | ICD-10-CM

## 2022-11-29 DIAGNOSIS — N6459 Other signs and symptoms in breast: Secondary | ICD-10-CM

## 2022-11-29 NOTE — Telephone Encounter (Signed)
Please schedule dx right mammogram and right breast US at [facility].   My patient had a palpable ridge along her right inferior breast and a history of right breast atypia.

## 2022-11-29 NOTE — Telephone Encounter (Signed)
Received referral from Dr. [name] for this pt to participate in Pulmonary Rehab with the diagnosis of Sarcoidosis. Clinical review of pt follow up appt on 11/27/2022 Pulmonary office note. Pt appropriate for scheduling for Pulmonary rehab. Will forward to support staff for scheduling and verification of insurance eligibility/benefits with pt consent.   Essie Hart, RN, BSN Cardiac and Pulmonary Rehab

## 2022-11-29 NOTE — Progress Notes (Signed)
GYNECOLOGY  VISIT   HPI: 57 y.o.   Married  Caucasian  female   G2P2002 with No LMP recorded. Patient has had an ablation.   here for   reoccuring vaginal pain. Pt has bleeding externally during intercourse. Creams have not been working.  It hurts and bleeds.   Also wants a breast check and feels a cord on her right breast.   Hx breast atypia.  Patient had a TVT 06/05/22 with Dr. Florian Buff.  She used vaginal estrogen cream to help with healing post op.  This did not help her vulvar pain symptoms.   Hx lichen planus of vulva.  Biopsy 2018. Has Clobetasol ointment, which has also is not helping.  The biopsy site is a different area from her area of concern today.  Used lidocaine cream and vitamin E cream which has not helped.   Has diabetes.  Now on Mounjaro.  Has sarcoidosis, tx with Dr. [name], pulmonologist with [facility].  Taking oral prednisone and doing a taper.  Taking methotrexate once a week, and she will continue for a while.  GYNECOLOGIC HISTORY: No LMP recorded. Patient has had an ablation. Contraception:  ablation/vasectomy Menopausal hormone therapy:  n/a Last mammogram:  05/09/22 Breast Density Cat A, BI-RADS CAT 1 neg Last pap smear:   11/28/17 neg: HR HPV neg        OB History     Gravida  2   Para  2   Term  2   Preterm      AB      Living  2      SAB      IAB      Ectopic      Multiple      Live Births  2              Patient Active Problem List   Diagnosis Date Noted   Anxiety disorder, unspecified 09/25/2022   Hypertension 09/25/2022   Depression 09/25/2022   Type 2 diabetes mellitus (HCC) 09/25/2022   Sarcoidosis 09/25/2022   High risk medication use 09/25/2022   Lymphadenopathy 06/13/2022   Splenic lesion 05/20/2022   Type 2 diabetes mellitus without complication, without long-term current use of insulin (HCC) 05/05/2022   Other allergic rhinitis 04/04/2022   Pain in thoracic spine 07/18/2021   Lumbar pain 07/12/2021    Myalgia, unspecified site 06/21/2021   Contact dermatitis 02/11/2021   Chronic foot pain 10/08/2019   Sleep disturbance 10/08/2019   Elevated LFTs 12/12/2018   Mixed hyperlipidemia 12/12/2018   Fen-phen history 11/24/2018   Morbid obesity (HCC) 11/24/2018    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   Migraines    Nausea in adult    S/P breast biopsy 04/06/2021   right breast biopsy   Sarcoidosis    Shortness of breath    Stress    Subclinical hypothyroidism 09/2009    Past Surgical History:  Procedure Laterality Date   BLADDER SUSPENSION N/A 06/05/2022   Procedure: TRANSVAGINAL TAPE (TVT) PROCEDURE;  Surgeon: Marguerita Beards, MD;  Location: [facility];  Service: Gynecology;  Laterality: N/A;  total  time requested is 1 hour   COLONOSCOPY     CYSTOSCOPY N/A 06/05/2022   Procedure: CYSTOSCOPY;  Surgeon: Marguerita Beards, MD;  Location: [facility];  Service: Gynecology;  Laterality: N/A;   LITHOTRIPSY     LYMPH NODE DISSECTION     NOVASURE ABLATION  12/18/2006   RADIOACTIVE SEED GUIDED EXCISIONAL BREAST BIOPSY Right 05/30/2021   Procedure: RADIOACTIVE SEED GUIDED EXCISIONAL RIGHT BREAST BIOPSY;  Surgeon: Emelia Loron, MD;  Location: [facility];  Service: General;  Laterality: Right;   ROTATOR CUFF REPAIR Right 06/20/2007    Current Outpatient Medications  Medication Sig Dispense Refill   Budeson-Glycopyrrol-Formoterol (BREZTRI AEROSPHERE) 160-9-4.8 MCG/ACT AERO Inhale 2 puffs into the lungs in the morning and at bedtime. 10.7 g 0   clobetasol ointment (TEMOVATE) 0.05 % Apply 1 Application topically 2 (two) times daily. Use for 2 weeks at a time for a flare.   You may use the cream twice a week at bedtime for maintenance dosing. 60 g 1   folic acid (FOLVITE) 1 MG tablet Take 2 tablets (2 mg total) by mouth daily. 180 tablet 11   metFORMIN (GLUCOPHAGE-XR) 500 MG 24 hr tablet Take 2 tablets (1,000 mg total) by mouth daily before supper. 180 tablet 3   methotrexate (RHEUMATREX) 2.5 MG tablet Take 8 tablets (20 mg total) by mouth once a week. 96 tablet 1   omeprazole (PRILOSEC) 20 MG capsule Take 1 capsule (20 mg total) by mouth daily. 90 capsule 5   ondansetron (ZOFRAN) 4 MG tablet 1 tablet Orally Once a day for 30 day(s)     predniSONE (DELTASONE) 5 MG tablet Take 1 tablet (5 mg total) by mouth daily with breakfast. 30 tablet 2   tirzepatide (MOUNJARO) 5 MG/0.5ML Pen Inject 5 mg into the skin once a week. 6 mL 3   valACYclovir (VALTREX) 1000 MG tablet Take 2 Tablets By Mouth Every 12 Hours for 24 hours as needed for an outbreak. 30 tablet 1   Vibegron 75 MG TABS Take 1 tablet (75 mg total) by mouth daily. 90 tablet 3   No current facility-administered medications for this visit.     ALLERGIES: Tizanidine hcl and Clarithromycin  Family History  Problem Relation Age of Onset   Diabetes Mother    Hyperlipidemia Mother    Thyroid disease Mother    Anxiety disorder Mother    Obesity Mother    Aortic stenosis Mother    Arthritis-Osteo Sister    Colon cancer Paternal Grandmother    Heart disease Paternal Grandfather    Pancreatic cancer Neg Hx    Stomach cancer Neg Hx    Liver disease Neg Hx    Esophageal cancer Neg Hx     Social History   Socioeconomic History   Marital status: Married    Spouse name: Alinda Money   Number of children: 2   Years of education: Not on file   Highest education level: Not on file  Occupational History   Occupation: Hydrologist   Occupation: Chemical engineer  Tobacco Use   Smoking status: Never    Passive exposure: Never   Smokeless tobacco: Never  Vaping Use   Vaping  Use: Never used  Substance and Sexual Activity   Alcohol use: No    Alcohol/week: 0.0 standard drinks of alcohol   Drug use: No   Sexual activity: Yes    Partners: Male    Birth control/protection: Surgical    Comment: Ablation,  Vasectomy  Other Topics Concern   Not on file  Social History Narrative   Right handed   Drinks coffee   Lives with husband   Two story home   unemployed   Social Determinants of Health   Financial Resource Strain: Not on file  Food Insecurity: Not on file  Transportation Needs: Not on file  Physical Activity: Not on file  Stress: Not on file  Social Connections: Not on file  Intimate Partner Violence: Not on file    Review of Systems  All other systems reviewed and are negative.   PHYSICAL EXAMINATION:    BP 122/78 (BP Location: Left Arm, Patient Position: Sitting, Cuff Size: Normal)   Pulse 95   Ht 5\' 1"  (1.549 m)   Wt 191 lb (86.6 kg)   SpO2 96%   BMI 36.09 kg/m     General appearance: alert, cooperative and appears stated age Breasts:  left - normal appearance, no masses or tenderness, No nipple retraction or dimpling, No nipple discharge or bleeding, No axillary or supraclavicular adenopathy Right - normal appearance, ridge felt in inferior right breast, no tenderness, No nipple retraction or dimpling, No nipple discharge or bleeding, No axillary or supraclavicular adenopathy  Pelvic: External genitalia:  healed fissure of left perienum - 3 - 4 mm.              Urethra:  normal appearing urethra with no masses, tenderness or lesions              Bartholins and Skenes: normal                 Vagina: normal appearing vagina with normal color and discharge, no lesions              Cervix: no lesions                Bimanual Exam:  Uterus:  normal size, contour, position, consistency, mobility, non-tender              Adnexa: no mass, fullness, tenderness    Chaperone was present for exam:  Warren Lacy, CMA  ASSESSMENT  Right inferior  breast thickening/ridge.  Perineal pain.  Vulvar lesion.  PLAN  Right mammogram and right breast US at [facility].  Return for vulvar biopsy.  A 5 mm punch biopsy may remove the entire area.  I told patient that sutures may be needed.   30 min  total time was spent for this patient encounter, including preparation, face-to-face counseling with the patient, coordination of care, and documentation of the encounter.

## 2022-11-30 LAB — COMPREHENSIVE METABOLIC PANEL
ALT: 25 IU/L (ref 0–32)
AST: 24 IU/L (ref 0–40)
Albumin/Globulin Ratio: 1.7
Albumin: 4.4 g/dL (ref 3.8–4.9)
Alkaline Phosphatase: 82 IU/L (ref 44–121)
BUN/Creatinine Ratio: 11 (ref 9–23)
BUN: 11 mg/dL (ref 6–24)
Bilirubin Total: 0.5 mg/dL (ref 0.0–1.2)
CO2: 25 mmol/L (ref 20–29)
Calcium: 10 mg/dL (ref 8.7–10.2)
Chloride: 101 mmol/L (ref 96–106)
Creatinine, Ser: 0.97 mg/dL (ref 0.57–1.00)
Globulin, Total: 2.6 g/dL (ref 1.5–4.5)
Glucose: 108 mg/dL — ABNORMAL HIGH (ref 70–99)
Potassium: 5 mmol/L (ref 3.5–5.2)
Sodium: 141 mmol/L (ref 134–144)
Total Protein: 7 g/dL (ref 6.0–8.5)
eGFR: 69 mL/min/{1.73_m2} (ref >59)

## 2022-11-30 LAB — CBC WITH DIFFERENTIAL
Basophils Absolute: 0.1 10*3/uL (ref 0.0–0.2)
Basos: 1 %
EOS (ABSOLUTE): 0.4 10*3/uL (ref 0.0–0.4)
Eos: 3 %
Hematocrit: 43.1 % (ref 34.0–46.6)
Hemoglobin: 14.5 g/dL (ref 11.1–15.9)
Immature Grans (Abs): 0.1 10*3/uL (ref 0.0–0.1)
Immature Granulocytes: 1 %
Lymphocytes Absolute: 1.1 10*3/uL (ref 0.7–3.1)
Lymphs: 9 %
MCH: 30.8 pg (ref 26.6–33.0)
MCHC: 33.6 g/dL (ref 31.5–35.7)
MCV: 92 fL (ref 79–97)
Monocytes Absolute: 1.3 10*3/uL — ABNORMAL HIGH (ref 0.1–0.9)
Monocytes: 10 %
Neutrophils Absolute: 9.6 10*3/uL — ABNORMAL HIGH (ref 1.4–7.0)
Neutrophils: 76 %
RBC: 4.71 x10E6/uL (ref 3.77–5.28)
RDW: 15 % (ref 11.7–15.4)
WBC: 12.5 10*3/uL — ABNORMAL HIGH (ref 3.4–10.8)

## 2022-11-30 LAB — QUANTIFERON-TB GOLD PLUS

## 2022-11-30 NOTE — Telephone Encounter (Signed)
Order filled out and placed on Dr. Rica Records desk for authorization.   Order signed by Dr. Edward Jolly and faxed successfully to [facility] They will contact pt to schedule appt.

## 2022-12-13 ENCOUNTER — Encounter: Payer: Self-pay | Admitting: Obstetrics and Gynecology

## 2022-12-13 NOTE — Progress Notes (Deleted)
GYNECOLOGY  VISIT   HPI: 57 y.o.   Married  Caucasian  female   G2P2002 with No LMP recorded. Patient has had an ablation.   here for   vulvar bx  GYNECOLOGIC HISTORY: No LMP recorded. Patient has had an ablation. Contraception:  ablation/ vasectomy Menopausal hormone therapy:  n/a Last mammogram:  05/09/22 Breast Density Cat A, BI-RADS CAT 1 neg  Last pap smear:   11/28/17 neg: HR HPV neg         OB History     Gravida  2   Para  2   Term  2   Preterm      AB      Living  2      SAB      IAB      Ectopic      Multiple      Live Births  2              Patient Active Problem List   Diagnosis Date Noted   Anxiety disorder, unspecified 09/25/2022   Hypertension 09/25/2022   Depression 09/25/2022   Type 2 diabetes mellitus (HCC) 09/25/2022   Sarcoidosis 09/25/2022   High risk medication use 09/25/2022   Lymphadenopathy 06/13/2022   Splenic lesion 05/20/2022   Type 2 diabetes mellitus without complication, without long-term current use of insulin (HCC) 05/05/2022   Other allergic rhinitis 04/04/2022   Pain in thoracic spine 07/18/2021   Lumbar pain 07/12/2021   Myalgia, unspecified site 06/21/2021   Contact dermatitis 02/11/2021   Chronic foot pain 10/08/2019   Sleep disturbance 10/08/2019   Elevated LFTs 12/12/2018   Mixed hyperlipidemia 12/12/2018   Fen-phen history 11/24/2018   Morbid obesity (HCC) 11/24/2018    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   Migraines    Nausea in adult    S/P breast biopsy 04/06/2021   right breast biopsy   Sarcoidosis    Shortness of breath    Stress    Subclinical hypothyroidism 09/2009    Past Surgical History:  Procedure Laterality Date    BLADDER SUSPENSION N/A 06/05/2022   Procedure: TRANSVAGINAL TAPE (TVT) PROCEDURE;  Surgeon: Marguerita Beards, MD;  Location: [facility];  Service: Gynecology;  Laterality: N/A;  total time requested is 1 hour   COLONOSCOPY     CYSTOSCOPY N/A 06/05/2022   Procedure: CYSTOSCOPY;  Surgeon: Marguerita Beards, MD;  Location: [facility];  Service: Gynecology;  Laterality: N/A;   LITHOTRIPSY     LYMPH NODE DISSECTION     NOVASURE ABLATION  12/18/2006   RADIOACTIVE SEED GUIDED EXCISIONAL BREAST BIOPSY Right 05/30/2021   Procedure: RADIOACTIVE SEED GUIDED EXCISIONAL RIGHT BREAST BIOPSY;  Surgeon: Emelia Loron, MD;  Location: [facility];  Service: General;  Laterality: Right;   ROTATOR CUFF REPAIR Right 06/20/2007    Current Outpatient Medications  Medication Sig Dispense Refill   Budeson-Glycopyrrol-Formoterol (BREZTRI AEROSPHERE) 160-9-4.8 MCG/ACT AERO Inhale 2 puffs into the lungs in the morning and at bedtime. 10.7 g 0   clobetasol ointment (TEMOVATE) 0.05 % Apply 1 Application topically 2 (two) times daily.  Use for 2 weeks at a time for a flare.  You may use the cream twice a week at bedtime for maintenance dosing. 60 g 1   folic acid (FOLVITE) 1 MG tablet Take 2 tablets (2 mg total) by mouth daily. 180 tablet 11   metFORMIN (GLUCOPHAGE-XR) 500 MG 24 hr tablet Take 2 tablets (1,000 mg total) by mouth daily before supper. 180 tablet 3   methotrexate (RHEUMATREX) 2.5 MG tablet Take 8 tablets (20 mg total) by mouth once a week. 96 tablet 1   omeprazole (PRILOSEC) 20 MG capsule Take 1 capsule (20 mg total) by mouth daily. 90 capsule 5   ondansetron (ZOFRAN) 4 MG tablet 1 tablet Orally Once a day for 30 day(s)     predniSONE (DELTASONE) 5 MG tablet Take 1 tablet (5 mg total) by mouth daily with breakfast. 30 tablet 2   tirzepatide (MOUNJARO) 5 MG/0.5ML Pen Inject 5 mg into the skin once a week. 6 mL 3   valACYclovir (VALTREX) 1000 MG tablet  Take 2 Tablets By Mouth Every 12 Hours for 24 hours as needed for an outbreak. 30 tablet 1   Vibegron 75 MG TABS Take 1 tablet (75 mg total) by mouth daily. 90 tablet 3   No current facility-administered medications for this visit.     ALLERGIES: Tizanidine hcl and Clarithromycin  Family History  Problem Relation Age of Onset   Diabetes Mother    Hyperlipidemia Mother    Thyroid disease Mother    Anxiety disorder Mother    Obesity Mother    Aortic stenosis Mother    Arthritis-Osteo Sister    Colon cancer Paternal Grandmother    Heart disease Paternal Grandfather    Pancreatic cancer Neg Hx    Stomach cancer Neg Hx    Liver disease Neg Hx    Esophageal cancer Neg Hx     Social History   Socioeconomic History   Marital status: Married    Spouse name: Alinda Money   Number of children: 2   Years of education: Not on file   Highest education level: Not on file  Occupational History   Occupation: Hydrologist   Occupation: Chemical engineer  Tobacco Use   Smoking status: Never    Passive exposure: Never   Smokeless tobacco: Never  Vaping Use   Vaping Use: Never used  Substance and Sexual Activity   Alcohol use: No    Alcohol/week: 0.0 standard drinks of alcohol   Drug use: No   Sexual activity: Yes    Partners: Male    Birth control/protection: Surgical    Comment: Ablation, Vasectomy  Other Topics Concern   Not on file  Social History Narrative   Right handed   Drinks coffee   Lives with husband   Two story home   unemployed   Social Determinants of Health   Financial Resource Strain: Not on file  Food Insecurity: Not on file  Transportation Needs: Not on file  Physical Activity: Not on file  Stress: Not on file  Social Connections: Not on file  Intimate Partner Violence: Not on file    Review of Systems  PHYSICAL EXAMINATION:    There were no vitals taken for this visit.    General appearance: alert, cooperative  and appears stated age Head: Normocephalic, without obvious abnormality, atraumatic Neck: no adenopathy, supple, symmetrical, trachea midline and thyroid normal to inspection and palpation Lungs: clear to auscultation bilaterally Breasts: normal appearance, no masses or tenderness,  No nipple retraction or dimpling, No nipple discharge or bleeding, No axillary or supraclavicular adenopathy Heart: regular rate and rhythm Abdomen: soft, non-tender, no masses,  no organomegaly Extremities: extremities normal, atraumatic, no cyanosis or edema Skin: Skin color, texture, turgor normal. No rashes or lesions Lymph nodes: Cervical, supraclavicular, and axillary nodes normal. No abnormal inguinal nodes palpated Neurologic: Grossly normal  Pelvic: External genitalia:  no lesions              Urethra:  normal appearing urethra with no masses, tenderness or lesions              Bartholins and Skenes: normal                 Vagina: normal appearing vagina with normal color and discharge, no lesions              Cervix: no lesions                Bimanual Exam:  Uterus:  normal size, contour, position, consistency, mobility, non-tender              Adnexa: no mass, fullness, tenderness              Rectal exam: {yes no:[id number]}.  Confirms.              Anus:  normal sphincter tone, no lesions  Chaperone was present for exam:  ***  ASSESSMENT     PLAN     An After Visit Summary was printed and given to the patient.  ______ minutes face to face time of which over 50% was spent in counseling.

## 2022-12-22 ENCOUNTER — Encounter (HOSPITAL_COMMUNITY): Payer: Self-pay

## 2022-12-27 ENCOUNTER — Ambulatory Visit: Payer: BC Managed Care – PPO | Admitting: Obstetrics and Gynecology

## 2023-01-01 ENCOUNTER — Ambulatory Visit: Payer: BC Managed Care – PPO | Admitting: Obstetrics and Gynecology

## 2023-01-05 ENCOUNTER — Ambulatory Visit: Payer: BC Managed Care – PPO | Admitting: Obstetrics and Gynecology

## 2023-01-08 ENCOUNTER — Telehealth: Payer: Self-pay | Admitting: Pulmonary Disease

## 2023-01-08 NOTE — Telephone Encounter (Signed)
NP Lauretta Chester calling  From [facility] she stated the pt provided FMLA paperwork but her PCP is not see any support that  the pt, needs to be out of work told NP Larose Hires that maybe the pt. Want to be out longer due to [facility] but [t. Looks like she has not started it yet wants feed back from our Dr.

## 2023-01-08 NOTE — Telephone Encounter (Signed)
Attempted to contact patient by phone. No answer. Left HIPAA compliant voicemail with callback number and also referenced that I would be sending detailed mychart message on 01/08/23.   Mychart message  Ms. Daphine Deutscher,  Regarding FMLA, I recommend scheduling a video visit to discuss your specific limitations. At this point I can fill out the form on the basis of frequency of doctor visits. Once you are enrolled and participating in Pulmonary Rehab, we can add those dates. However Pulmonary Rehab has attempted to contact you and sent the following message on 12/22/22:  "Dr. [name] has referred you to the Pulmonary Rehabilitation Program at [facility]. Unfortunately, we have been unable to reach you by telephone to schedule. If you would like more information about the program, please contact us at [phone] during the hours listed below. Any staff member will be happy to assist you.  Hours: Monday - Friday:  8:00 am to 4:30 pm"    Thank you,   Pulmonary Rehabilitation Staff  Last read by [name] at 10:01 AM on 12/24/2022."   I will have the front desk staff reach out to you for an appointment when next available.  Kind regards, Dr. [name]

## 2023-01-09 ENCOUNTER — Encounter (HOSPITAL_COMMUNITY): Payer: Self-pay

## 2023-01-11 ENCOUNTER — Telehealth (HOSPITAL_COMMUNITY): Payer: Self-pay

## 2023-01-11 NOTE — Telephone Encounter (Signed)
Called to confirm appt. Pt confirmed appt. Instructed pt on proper footwear. Gave directions along with department number.

## 2023-01-12 ENCOUNTER — Encounter (HOSPITAL_COMMUNITY)
Admission: RE | Admit: 2023-01-12 | Discharge: 2023-01-12 | Disposition: A | Discharge status: Home / Self Care | Payer: BC Managed Care – PPO | Source: Ambulatory Visit | Attending: Pulmonary Disease | Admitting: Pulmonary Disease

## 2023-01-12 ENCOUNTER — Encounter (HOSPITAL_COMMUNITY): Payer: Self-pay

## 2023-01-12 VITALS — BP 122/88 | HR 87 | Resp 16 | Ht 61.5 in | Wt 192.6 lb

## 2023-01-12 DIAGNOSIS — D869 Sarcoidosis, unspecified: Secondary | ICD-10-CM | POA: Diagnosis present

## 2023-01-12 NOTE — Progress Notes (Signed)
Pulmonary Individual Treatment Plan  Patient Details  Name: [name] MRN: [id number] Date of Birth: 22-Apr-1966 Referring Provider:   Doristine Devoid Pulmonary Rehab Walk Test from 01/12/2023 in [facility]  Referring Provider [name]       Initial Encounter Date:  Flowsheet Row Pulmonary Rehab Walk Test from 01/12/2023 in [facility]  Date 01/12/23       Visit Diagnosis: Sarcoidosis  Patient's Home Medications on Admission:   Current Outpatient Medications:    Budeson-Glycopyrrol-Formoterol (BREZTRI AEROSPHERE) 160-9-4.8 MCG/ACT AERO, Inhale 2 puffs into the lungs in the morning and at bedtime., Disp: 10.7 g, Rfl: 0   clobetasol ointment (TEMOVATE) 0.05 %, Apply 1 Application topically 2 (two) times daily. Use for 2 weeks at a time for a flare.  You may use the cream twice a week at bedtime for maintenance dosing., Disp: 60 g, Rfl: 1   folic acid (FOLVITE) 1 MG tablet, Take 2 tablets (2 mg total) by mouth daily., Disp: 180 tablet, Rfl: 11   metFORMIN (GLUCOPHAGE-XR) 500 MG 24 hr tablet, Take 2 tablets (1,000 mg total) by mouth daily before supper., Disp: 180 tablet, Rfl: 3   methotrexate (RHEUMATREX) 2.5 MG tablet, Take 8 tablets (20 mg total) by mouth once a week., Disp: 96 tablet, Rfl: 1   omeprazole (PRILOSEC) 20 MG capsule, Take 1 capsule (20 mg total) by mouth daily., Disp: 90 capsule, Rfl: 5   ondansetron (ZOFRAN) 4 MG tablet, 1 tablet Orally Once a day for 30 day(s), Disp: , Rfl:    predniSONE (DELTASONE) 5 MG tablet, Take 1 tablet (5 mg total) by mouth daily with breakfast., Disp: 30 tablet, Rfl: 2   tirzepatide (MOUNJARO) 5 MG/0.5ML Pen, Inject 5 mg into the skin once a week., Disp: 6 mL, Rfl: 3   valACYclovir (VALTREX) 1000 MG tablet, Take 2 Tablets By Mouth Every 12 Hours for 24 hours as needed for an outbreak., Disp: 30 tablet, Rfl: 1   Vibegron 75 MG  TABS, Take 1 tablet (75 mg total) by mouth daily., Disp: 90 tablet, Rfl: 3  Past Medical History: 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   Migraines    Nausea in adult    S/P breast biopsy 04/06/2021   right breast biopsy   Sarcoidosis    Shortness of breath    Stress    Subclinical hypothyroidism 09/2009    Tobacco Use: Social History   Tobacco Use  Smoking Status Never   Passive exposure: Never  Smokeless Tobacco Never    Labs: Review Flowsheet  More data exists      Latest Ref Rng & Units 12/06/2018 12/15/2019 05/05/2022 06/05/2022 09/19/2022  Labs for ITP Cardiac and Pulmonary Rehab  Cholestrol 100 - 199 mg/dL 562  130  - - -  LDL (calc) 0 - 99 mg/dL 865  784     696  - - -  HDL-C >39 mg/dL 65  65     73  - - -  Trlycerides 0 - 149 mg/dL 295  284  - - -  Hemoglobin A1c 4.0 - 5.6 % 6.2  6.3  6.0  - 6.6   TCO2 22 - 32 mmol/L - - - 28  -    Details       This result is from an external source.   Multiple values from one day are sorted in reverse-chronological order         Capillary Blood Glucose: Lab Results  Component Value Date   GLUCAP 118 (H) 06/05/2022   GLUCAP 107 (H) 05/24/2022    POCT Glucose     Row Name 01/12/23 0949             POCT Blood Glucose   Pre-Exercise 122 mg/dL                Pulmonary Assessment Scores:  Pulmonary Assessment Scores     Row Name 01/12/23 0956         ADL UCSD   ADL Phase Entry     SOB Score total 19       CAT Score   CAT Score 11       mMRC Score   mMRC Score 2             UCSD: Self-administered rating of dyspnea associated with activities of daily living (ADLs) 6-point scale (0 = "not at all" to 5 = "maximal or unable to do  because of breathlessness")  Scoring Scores range from 0 to 120.  Minimally important difference is 5 units  CAT: CAT can identify the health impairment of COPD patients and is better correlated with disease progression.  CAT has a scoring range of zero to 40. The CAT score is classified into four groups of low (less than 10), medium (10 - 20), high (21-30) and very high (31-40) based on the impact level of disease on health status. A CAT score over 10 suggests significant symptoms.  A worsening CAT score could be explained by an exacerbation, poor medication adherence, poor inhaler technique, or progression of COPD or comorbid conditions.  CAT MCID is 2 points  mMRC: mMRC (Modified Medical Research Council) Dyspnea Scale is used to assess the degree of baseline functional disability in patients of respiratory disease due to dyspnea. No minimal important difference is established. A decrease in score of 1 point or greater is considered a positive change.   Pulmonary Function Assessment:  Pulmonary Function Assessment - 01/12/23 0930       Breath   Bilateral Breath Sounds Decreased    Shortness of Breath Yes;Fear of Shortness of Breath;Limiting activity             Exercise Target Goals: Exercise Program Goal: Individual exercise prescription set using results from initial 6 min walk test and THRR while considering  patient's activity barriers and safety.   Exercise Prescription Goal: Initial exercise prescription builds to 30-45 minutes a day of aerobic activity, 2-3 days per week.  Home exercise guidelines will be given to patient during program as part of exercise prescription that the participant will acknowledge.  Activity Barriers & Risk Stratification:  Activity Barriers & Cardiac Risk Stratification - 01/12/23 0915       Activity Barriers & Cardiac Risk Stratification   Activity Barriers Balance Concerns;Deconditioning;Muscular Weakness;Shortness of Breath              6 Minute Walk:  6 Minute Walk     Row Name 01/12/23 0958         6 Minute Walk   Phase Initial  Distance 1090 feet     Walk Time 6 minutes     # of Rest Breaks 0     MPH 2.06     METS 2.82     RPE 10     Perceived Dyspnea  1     VO2 Peak 9.88     Symptoms No     Resting HR 89 bpm     Resting BP 122/88     Resting Oxygen Saturation  98 %     Exercise Oxygen Saturation  during 6 min walk 95 %     Max Ex. HR 108 bpm     Max Ex. BP 126/72     2 Minute Post BP 114/70       Interval HR   1 Minute HR 104     2 Minute HR 106     3 Minute HR 104     4 Minute HR 106     5 Minute HR 102     6 Minute HR 108     2 Minute Post HR 92     Interval Heart Rate? Yes       Interval Oxygen   Interval Oxygen? Yes     Baseline Oxygen Saturation % 98 %     1 Minute Oxygen Saturation % 95 %     1 Minute Liters of Oxygen 0 L     2 Minute Oxygen Saturation % 97 %     2 Minute Liters of Oxygen 0 L     3 Minute Oxygen Saturation % 98 %     3 Minute Liters of Oxygen 0 L     4 Minute Oxygen Saturation % 97 %     4 Minute Liters of Oxygen 0 L     5 Minute Oxygen Saturation % 99 %     5 Minute Liters of Oxygen 0 L     6 Minute Oxygen Saturation % 100 %     6 Minute Liters of Oxygen 0 L     2 Minute Post Oxygen Saturation % 100 %     2 Minute Post Liters of Oxygen 0 L              Oxygen Initial Assessment:  Oxygen Initial Assessment - 01/12/23 0926       Home Oxygen   Home Oxygen Device None    Sleep Oxygen Prescription None    Home Exercise Oxygen Prescription None    Home Resting Oxygen Prescription None      Initial 6 min Walk   Oxygen Used None      Program Oxygen Prescription   Program Oxygen Prescription None      Intervention   Short Term Goals To learn and understand importance of maintaining oxygen saturations>88%;To learn and demonstrate proper use of respiratory medications;To learn and understand importance of monitoring SPO2 with pulse oximeter and  demonstrate accurate use of the pulse oximeter.;To learn and demonstrate proper pursed lip breathing techniques or other breathing techniques.     Long  Term Goals Maintenance of O2 saturations>88%;Compliance with respiratory medication;Verbalizes importance of monitoring SPO2 with pulse oximeter and return demonstration;Exhibits proper breathing techniques, such as pursed lip breathing or other method taught during program session;Demonstrates proper use of MDI's             Oxygen Re-Evaluation:   Oxygen Discharge (Final Oxygen Re-Evaluation):   Initial Exercise Prescription:  Initial Exercise Prescription - 01/12/23 1000  Date of Initial Exercise RX and Referring Provider   Date 01/12/23    Referring Provider [name]    Expected Discharge Date 04/12/23      Treadmill   MPH 2    Grade 0    Minutes 15      Elliptical   Level 1    Speed 1    Minutes 15      Prescription Details   Frequency (times per week) 2    Duration Progress to 30 minutes of continuous aerobic without signs/symptoms of physical distress      Intensity   THRR 40-80% of Max Heartrate 66-131    Ratings of Perceived Exertion 11-13    Perceived Dyspnea 0-4      Progression   Progression Continue to progress workloads to maintain intensity without signs/symptoms of physical distress.      Resistance Training   Training Prescription Yes    Weight black bands    Reps 10-15             Perform Capillary Blood Glucose checks as needed.  Exercise Prescription Changes:   Exercise Comments:   Exercise Goals and Review:   Exercise Goals     Row Name 01/12/23 0916             Exercise Goals   Increase Physical Activity Yes       Intervention Provide advice, education, support and counseling about physical activity/exercise needs.;Develop an individualized exercise prescription for aerobic and resistive training based on initial evaluation findings, risk stratification,  comorbidities and participant's personal goals.       Expected Outcomes Short Term: Attend rehab on a regular basis to increase amount of physical activity.;Long Term: Exercising regularly at least 3-5 days a week.;Long Term: Add in home exercise to make exercise part of routine and to increase amount of physical activity.       Increase Strength and Stamina Yes       Intervention Provide advice, education, support and counseling about physical activity/exercise needs.;Develop an individualized exercise prescription for aerobic and resistive training based on initial evaluation findings, risk stratification, comorbidities and participant's personal goals.       Expected Outcomes Short Term: Increase workloads from initial exercise prescription for resistance, speed, and METs.;Short Term: Perform resistance training exercises routinely during rehab and add in resistance training at home;Long Term: Improve cardiorespiratory fitness, muscular endurance and strength as measured by increased METs and functional capacity ( )       Able to understand and use rate of perceived exertion (RPE) scale Yes       Intervention Provide education and explanation on how to use RPE scale       Expected Outcomes Short Term: Able to use RPE daily in rehab to express subjective intensity level;Long Term:  Able to use RPE to guide intensity level when exercising independently       Able to understand and use Dyspnea scale Yes       Intervention Provide education and explanation on how to use Dyspnea scale       Expected Outcomes Short Term: Able to use Dyspnea scale daily in rehab to express subjective sense of shortness of breath during exertion;Long Term: Able to use Dyspnea scale to guide intensity level when exercising independently       Knowledge and understanding of Target Heart Rate Range (THRR) Yes       Intervention Provide education and explanation of THRR including how the numbers were predicted and  where they  are located for reference       Expected Outcomes Short Term: Able to state/look up THRR;Short Term: Able to use daily as guideline for intensity in rehab;Long Term: Able to use THRR to govern intensity when exercising independently       Understanding of Exercise Prescription Yes       Intervention Provide education, explanation, and written materials on patient's individual exercise prescription       Expected Outcomes Short Term: Able to explain program exercise prescription;Long Term: Able to explain home exercise prescription to exercise independently                Exercise Goals Re-Evaluation :   Discharge Exercise Prescription (Final Exercise Prescription Changes):   Nutrition:  Target Goals: Understanding of nutrition guidelines, daily intake of sodium 1500mg , cholesterol 200mg , calories 30% from fat and 7% or less from saturated fats, daily to have 5 or more servings of fruits and vegetables.  Biometrics:  Pre Biometrics - 01/12/23 0909       Pre Biometrics   Grip Strength 20 kg              Nutrition Therapy Plan and Nutrition Goals:   Nutrition Assessments:  MEDIFICTS Score Key: ?70 Need to make dietary changes  40-70 Heart Healthy Diet ? 40 Therapeutic Level Cholesterol Diet   Picture Your Plate Scores: <16 Unhealthy dietary pattern with much room for improvement. 41-50 Dietary pattern unlikely to meet recommendations for good health and room for improvement. 51-60 More healthful dietary pattern, with some room for improvement.  >60 Healthy dietary pattern, although there may be some specific behaviors that could be improved.    Nutrition Goals Re-Evaluation:   Nutrition Goals Discharge (Final Nutrition Goals Re-Evaluation):   Psychosocial: Target Goals: Acknowledge presence or absence of significant depression and/or stress, maximize coping skills, provide positive support system. Participant is able to verbalize types and ability to use  techniques and skills needed for reducing stress and depression.  Initial Review & Psychosocial Screening:  Initial Psych Review & Screening - 01/12/23 0922       Initial Review   Current issues with None Identified      Family Dynamics   Good Support System? Yes    Comments [name] states she has good support from her spouse      Barriers   Psychosocial barriers to participate in program There are no identifiable barriers or psychosocial needs.             Quality of Life Scores:  Scores of 19 and below usually indicate a poorer quality of life in these areas.  A difference of  2-3 points is a clinically meaningful difference.  A difference of 2-3 points in the total score of the Quality of Life Index has been associated with significant improvement in overall quality of life, self-image, physical symptoms, and general health in studies assessing change in quality of life.  PHQ-9: Review Flowsheet       12/15/2019 11/19/2018  Depression screen PHQ 2/9  Decreased Interest 3 0  Down, Depressed, Hopeless 2 0  PHQ - 2 Score 5 0  Altered sleeping 3 1  Tired, decreased energy 3 1  Change in appetite 3 0  Feeling bad or failure about yourself  0 0  Trouble concentrating 0 0  Moving slowly or fidgety/restless 1 0  Suicidal thoughts 0 0  PHQ-9 Score 15 2  Difficult doing work/chores Not difficult at all -  Details           Interpretation of Total Score  Total Score Depression Severity:  1-4 = Minimal depression, 5-9 = Mild depression, 10-14 = Moderate depression, 15-19 = Moderately severe depression, 20-27 = Severe depression   Psychosocial Evaluation and Intervention:  Psychosocial Evaluation - 01/12/23 0926       Psychosocial Evaluation & Interventions   Comments [name] denies any psychosocial barriers or concerns    Expected Outcomes For [name] to participate in PR without any psychosocial barriers or concerns    Continue Psychosocial Services  No Follow up  required             Psychosocial Re-Evaluation:   Psychosocial Discharge (Final Psychosocial Re-Evaluation):   Education: Education Goals: Education classes will be provided on a weekly basis, covering required topics. Participant will state understanding/return demonstration of topics presented.  Learning Barriers/Preferences:  Learning Barriers/Preferences - 01/12/23 1002       Learning Barriers/Preferences   Learning Barriers None    Learning Preferences Individual Instruction;Written Material;Verbal Instruction             Education Topics: Introduction to Pulmonary Rehab Group instruction provided by PowerPoint, verbal discussion, and written material to support subject matter. Instructor reviews what Pulmonary Rehab is, the purpose of the program, and how patients are referred.     Know Your Numbers Group instruction that is supported by a PowerPoint presentation. Instructor discusses importance of knowing and understanding resting, exercise, and post-exercise oxygen saturation, heart rate, and blood pressure. Oxygen saturation, heart rate, blood pressure, rating of perceived exertion, and dyspnea are reviewed along with a normal range for these values.    Exercise for the Pulmonary Patient Group instruction that is supported by a PowerPoint presentation. Instructor discusses benefits of exercise, core components of exercise, frequency, duration, and intensity of an exercise routine, importance of utilizing pulse oximetry during exercise, safety while exercising, and options of places to exercise outside of rehab.    MET Level  Group instruction provided by PowerPoint, verbal discussion, and written material to support subject matter. Instructor reviews what METs are and how to increase METs.    Pulmonary Medications Verbally interactive group education provided by instructor with focus on inhaled medications and proper administration.   Anatomy and  Physiology of the Respiratory System Group instruction provided by PowerPoint, verbal discussion, and written material to support subject matter. Instructor reviews respiratory cycle and anatomical components of the respiratory system and their functions. Instructor also reviews differences in obstructive and restrictive respiratory diseases with examples of each.    Oxygen Safety Group instruction provided by PowerPoint, verbal discussion, and written material to support subject matter. There is an overview of "What is Oxygen" and "Why do we need it".  Instructor also reviews how to create a safe environment for oxygen use, the importance of using oxygen as prescribed, and the risks of noncompliance. There is a brief discussion on traveling with oxygen and resources the patient may utilize.   Oxygen Use Group instruction provided by PowerPoint, verbal discussion, and written material to discuss how supplemental oxygen is prescribed and different types of oxygen supply systems. Resources for more information are provided.    Breathing Techniques Group instruction that is supported by demonstration and informational handouts. Instructor discusses the benefits of pursed lip and diaphragmatic breathing and detailed demonstration on how to perform both.     Risk Factor Reduction Group instruction that is supported by a PowerPoint presentation. Instructor discusses the definition of  a risk factor, different risk factors for pulmonary disease, and how the heart and lungs work together.   MD Day A group question and answer session with a medical doctor that allows participants to ask questions that relate to their pulmonary disease state.   Nutrition for the Pulmonary Patient Group instruction provided by PowerPoint slides, verbal discussion, and written materials to support subject matter. The instructor gives an explanation and review of healthy diet recommendations, which includes a discussion  on weight management, recommendations for fruit and vegetable consumption, as well as protein, fluid, caffeine, fiber, sodium, sugar, and alcohol. Tips for eating when patients are short of breath are discussed.    Other Education Group or individual verbal, written, or video instructions that support the educational goals of the pulmonary rehab program.    Knowledge Questionnaire Score:  Knowledge Questionnaire Score - 01/12/23 1002       Knowledge Questionnaire Score   Pre Score 17/18             Core Components/Risk Factors/Patient Goals at Admission:  Personal Goals and Risk Factors at Admission - 01/12/23 0919       Core Components/Risk Factors/Patient Goals on Admission    Weight Management Weight Loss;Yes    Intervention Weight Management: Develop a combined nutrition and exercise program designed to reach desired caloric intake, while maintaining appropriate intake of nutrient and fiber, sodium and fats, and appropriate energy expenditure required for the weight goal.;Weight Management: Provide education and appropriate resources to help participant work on and attain dietary goals.;Weight Management/Obesity: Establish reasonable short term and long term weight goals.;Obesity: Provide education and appropriate resources to help participant work on and attain dietary goals.    Admit Weight 192 lb 9.6 oz (87.4 kg)    Improve shortness of breath with ADL's Yes    Intervention Provide education, individualized exercise plan and daily activity instruction to help decrease symptoms of SOB with activities of daily living.    Expected Outcomes Short Term: Improve cardiorespiratory fitness to achieve a reduction of symptoms when performing ADLs;Long Term: Be able to perform more ADLs without symptoms or delay the onset of symptoms    Increase knowledge of respiratory medications and ability to use respiratory devices properly  Yes    Intervention Provide education and demonstration as  needed of appropriate use of medications, inhalers, and oxygen therapy.    Expected Outcomes Short Term: Achieves understanding of medications use. Understands that oxygen is a medication prescribed by physician. Demonstrates appropriate use of inhaler and oxygen therapy.;Long Term: Maintain appropriate use of medications, inhalers, and oxygen therapy.             Core Components/Risk Factors/Patient Goals Review:    Core Components/Risk Factors/Patient Goals at Discharge (Final Review):    ITP Comments:   Comments: Dr. Mechele Collin is Medical Director for Pulmonary Rehab at [facility].

## 2023-01-12 NOTE — Progress Notes (Signed)
[name] 57 y.o. female  Pulmonary Rehab Orientation Note  This patient who was referred to Pulmonary Rehab by Dr. [name] with the diagnosis of sarcoidosis arrived today in Cardiac and Pulmonary Rehab. She  arrived ambulatory with normal gait. She  does not carry portable oxygen. Per patient, Asialynn never uses oxygen. Color good, skin warm and dry. Patient is oriented to time and place. Patient's medical history, psychosocial health and medications reviewed.   Psychosocial assessment reveals patient lives with her spouse. [name] is currently unemployed. Patient hobbies include spending time with others and at the beach . Patient reports her stress level is low. Areas of stress/anxiety include her health. Patient does not exhibit signs of depression. PHQ2/9 score 0/0. [name] shows good  coping skills with positive outlook on life. Offered emotional support and reassurance. Will continue to monitor and evaluate progress toward psychosocial goal(s) of decreased health stress.  Physical assessment reveals patient is alert and oriented x 4. Heart rate is normal, breath sounds clear to auscultation, no wheezes, rales, or rhonchi. Pt denies chronic cough. Bowel sounds present x4 quads. Pt denies abdominal discomfort, nausea, vomiting, diarrhea or constipation. Grip strength equal, strong. Distal pulses +2; no swelling to lower extremities. [name] reports she does take medications as prescribed.   Patient states she follows a regular  diet. The patient has been trying to lose weight through a healthy diet and exercise program. Pt's weight will be monitored closely. Demonstration and practice of PLB using pulse oximeter. [name] able to return demonstration satisfactorily. Safety and hand hygiene in the exercise area reviewed with patient. [name] voices understanding of the information reviewed. Department expectations discussed with patient and achievable goals were set. The patient shows enthusiasm about  attending the program and we look forward to working with Dois Davenport. Harumi completed a 6 min walk test today and is scheduled to begin exercise on 08/13 at 1315.   0900-1030 Essie Hart, RN, BSN

## 2023-01-16 NOTE — Progress Notes (Unsigned)
GYNECOLOGY  VISIT   HPI: 57 y.o.   Married  Caucasian  female   G2P2002 with No LMP recorded. Patient has had an ablation.   here for  vulvar bx. Has a chronic area of the perineum that bleeds with intercourse and is painful.   GYNECOLOGIC HISTORY: No LMP recorded. Patient has had an ablation. Contraception:  ablation/vasectomy Menopausal hormone therapy:  n/a Last mammogram:  05/09/22 Breast Density Cat A, BI-RADS CAT 1 neg  Last pap smear:   11/28/17-WNL, HPV- neg, 11/26/14-WNL, 06/04/08- WNL         OB History     Gravida  2   Para  2   Term  2   Preterm      AB      Living  2      SAB      IAB      Ectopic      Multiple      Live Births  2              Patient Active Problem List   Diagnosis Date Noted   Anxiety disorder, unspecified 09/25/2022   Hypertension 09/25/2022   Depression 09/25/2022   Type 2 diabetes mellitus (HCC) 09/25/2022   Sarcoidosis 09/25/2022   High risk medication use 09/25/2022   Lymphadenopathy 06/13/2022   Splenic lesion 05/20/2022   Type 2 diabetes mellitus without complication, without long-term current use of insulin (HCC) 05/05/2022   Other allergic rhinitis 04/04/2022   Pain in thoracic spine 07/18/2021   Lumbar pain 07/12/2021   Myalgia, unspecified site 06/21/2021   Contact dermatitis 02/11/2021   Chronic foot pain 10/08/2019   Sleep disturbance 10/08/2019   Elevated LFTs 12/12/2018   Mixed hyperlipidemia 12/12/2018   Fen-phen history 11/24/2018   Morbid obesity (HCC) 11/24/2018    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   Migraines    Nausea in adult    S/P breast biopsy 04/06/2021   right breast biopsy   Sarcoidosis    Shortness of breath     Stress    Subclinical hypothyroidism 09/2009    Past Surgical History:  Procedure Laterality Date   BLADDER SUSPENSION N/A 06/05/2022   Procedure: TRANSVAGINAL TAPE (TVT) PROCEDURE;  Surgeon: Marguerita Beards, MD;  Location: [facility] [place];  Service: Gynecology;  Laterality: N/A;  total time requested is 1 hour   COLONOSCOPY     CYSTOSCOPY N/A 06/05/2022   Procedure: CYSTOSCOPY;  Surgeon: Marguerita Beards, MD;  Location: [facility] ;  Service: Gynecology;  Laterality: N/A;   LITHOTRIPSY     LYMPH NODE DISSECTION     NOVASURE ABLATION  12/18/2006   RADIOACTIVE SEED GUIDED EXCISIONAL BREAST BIOPSY Right 05/30/2021   Procedure: RADIOACTIVE SEED GUIDED EXCISIONAL RIGHT BREAST BIOPSY;  Surgeon: Emelia Loron, MD;  Location: [facility];  Service: General;  Laterality: Right;   ROTATOR CUFF REPAIR Right 06/20/2007    Current Outpatient Medications  Medication Sig Dispense Refill   Budeson-Glycopyrrol-Formoterol (BREZTRI AEROSPHERE) 160-9-4.8 MCG/ACT AERO Inhale 2 puffs into the lungs in the morning and at bedtime. 10.7 g 0  clobetasol ointment (TEMOVATE) 0.05 % Apply 1 Application topically 2 (two) times daily. Use for 2 weeks at a time for a flare.  You may use the cream twice a week at bedtime for maintenance dosing. 60 g 1   folic acid (FOLVITE) 1 MG tablet Take 2 tablets (2 mg total) by mouth daily. 180 tablet 11   metFORMIN (GLUCOPHAGE-XR) 500 MG 24 hr tablet Take 2 tablets (1,000 mg total) by mouth daily before supper. 180 tablet 3   methotrexate (RHEUMATREX) 2.5 MG tablet Take 8 tablets (20 mg total) by mouth once a week. 96 tablet 1   omeprazole (PRILOSEC) 20 MG capsule Take 1 capsule (20 mg total) by mouth daily. 90 capsule 5   ondansetron (ZOFRAN) 4 MG tablet 1 tablet Orally Once a day for 30 day(s)     predniSONE (DELTASONE) 5 MG tablet Take 1 tablet (5 mg total) by mouth daily with breakfast. 30 tablet 2   tirzepatide  (MOUNJARO) 5 MG/0.5ML Pen Inject 5 mg into the skin once a week. 6 mL 3   valACYclovir (VALTREX) 1000 MG tablet Take 2 Tablets By Mouth Every 12 Hours for 24 hours as needed for an outbreak. 30 tablet 1   Vibegron 75 MG TABS Take 1 tablet (75 mg total) by mouth daily. 90 tablet 3   No current facility-administered medications for this visit.     ALLERGIES: Tizanidine hcl and Clarithromycin  Family History  Problem Relation Age of Onset   Diabetes Mother    Hyperlipidemia Mother    Thyroid disease Mother    Anxiety disorder Mother    Obesity Mother    Aortic stenosis Mother    Arthritis-Osteo Sister    Colon cancer Paternal Grandmother    Heart disease Paternal Grandfather    Pancreatic cancer Neg Hx    Stomach cancer Neg Hx    Liver disease Neg Hx    Esophageal cancer Neg Hx     Social History   Socioeconomic History   Marital status: Married    Spouse name: Alinda Money   Number of children: 2   Years of education: Not on file   Highest education level: Bachelor's degree (e.g., BA, AB, BS)  Occupational History   Occupation: Hydrologist   Occupation: Chemical engineer  Tobacco Use   Smoking status: Never    Passive exposure: Never   Smokeless tobacco: Never  Vaping Use   Vaping status: Never Used  Substance and Sexual Activity   Alcohol use: No    Alcohol/week: 0.0 standard drinks of alcohol   Drug use: No   Sexual activity: Yes    Partners: Male    Birth control/protection: Surgical    Comment: Ablation, Vasectomy  Other Topics Concern   Not on file  Social History Narrative   Right handed   Drinks coffee   Lives with husband   Two story home   unemployed   Social Determinants of Health   Financial Resource Strain: Not on file  Food Insecurity: Not on file  Transportation Needs: Not on file  Physical Activity: Not on file  Stress: Not on file  Social Connections: Unknown (11/01/2021)   Received from [facility],  [facility]   Social Network    Social Network: Not on file  Intimate Partner Violence: Unknown (09/23/2021)   Received from [facility], [facility]   HITS    Physically Hurt: Not on file    Insult or Talk Down To: Not on  file    Threaten Physical Harm: Not on file    Scream or Curse: Not on file    Review of Systems  All other systems reviewed and are negative.   PHYSICAL EXAMINATION:    BP 132/88 (BP Location: Right Arm, Patient Position: Sitting, Cuff Size: Normal)   Pulse 88   Ht 5\' 1"  (1.549 m)   Wt 191 lb (86.6 kg)   SpO2 95%   BMI 36.09 kg/m     General appearance: alert, cooperative and appears stated age  Pelvic: External genitalia:  split in the skin of the midline perineum.              Urethra:  normal appearing urethra with no masses, tenderness or lesions   Vulvar biopsy. Consent done.  Sterile prep with Hibiclens.  Local 15 lidocaine, lot [id number], exp July 2026.  5 mm punch biopsy, which excised the area.  Tissue to pathology.  2 interrupted sutures of 3/0 Vicryl.  No complications.  Minimal EBL.  Chaperone was present for exam:  Irving Burton, CMA  ASSESSMENT  Vulvar perineal lesion.    PLAN  Fu biopsy.  Instructions for care post biopsy given.  FU prn.

## 2023-01-24 NOTE — Progress Notes (Signed)
Pulmonary Individual Treatment Plan  Patient Details  Name: [name] MRN: [id number] Date of Birth: [birth year] Referring Provider:   Doristine Devoid Pulmonary Rehab Walk Test from 01/12/2023 in [facility]  Referring Provider [name]       Initial Encounter Date:  Flowsheet Row Pulmonary Rehab Walk Test from 01/12/2023 in [facility]  Date 01/12/23       Visit Diagnosis: Sarcoidosis  Patient's Home Medications on Admission:   Current Outpatient Medications:    Budeson-Glycopyrrol-Formoterol (BREZTRI AEROSPHERE) 160-9-4.8 MCG/ACT AERO, Inhale 2 puffs into the lungs in the morning and at bedtime., Disp: 10.7 g, Rfl: 0   clobetasol ointment (TEMOVATE) 0.05 %, Apply 1 Application topically 2 (two) times daily. Use for 2 weeks at a time for a flare.  You may use the cream twice a week at bedtime for maintenance dosing., Disp: 60 g, Rfl: 1   folic acid (FOLVITE) 1 MG tablet, Take 2 tablets (2 mg total) by mouth daily., Disp: 180 tablet, Rfl: 11   metFORMIN (GLUCOPHAGE-XR) 500 MG 24 hr tablet, Take 2 tablets (1,000 mg total) by mouth daily before supper., Disp: 180 tablet, Rfl: 3   methotrexate (RHEUMATREX) 2.5 MG tablet, Take 8 tablets (20 mg total) by mouth once a week., Disp: 96 tablet, Rfl: 1   omeprazole (PRILOSEC) 20 MG capsule, Take 1 capsule (20 mg total) by mouth daily., Disp: 90 capsule, Rfl: 5   ondansetron (ZOFRAN) 4 MG tablet, 1 tablet Orally Once a day for 30 day(s), Disp: , Rfl:    predniSONE (DELTASONE) 5 MG tablet, Take 1 tablet (5 mg total) by mouth daily with breakfast., Disp: 30 tablet, Rfl: 2   tirzepatide (MOUNJARO) 5 MG/0.5ML Pen, Inject 5 mg into the skin once a week., Disp: 6 mL, Rfl: 3   valACYclovir (VALTREX) 1000 MG tablet, Take 2 Tablets By Mouth Every 12 Hours for 24 hours as needed for an outbreak., Disp: 30 tablet, Rfl: 1   Vibegron 75 MG  TABS, Take 1 tablet (75 mg total) by mouth daily., Disp: 90 tablet, Rfl: 3  Past Medical History: 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   Migraines    Nausea in adult    S/P breast biopsy 04/06/2021   right breast biopsy   Sarcoidosis    Shortness of breath    Stress    Subclinical hypothyroidism 09/2009    Tobacco Use: Social History   Tobacco Use  Smoking Status Never   Passive exposure: Never  Smokeless Tobacco Never    Labs: Review Flowsheet  More data exists      Latest Ref Rng & Units 12/06/2018 12/15/2019 05/05/2022 06/05/2022 09/19/2022  Labs for ITP Cardiac and Pulmonary Rehab  Cholestrol 100 - 199 mg/dL 409  811  - - -  LDL (calc) 0 - 99 mg/dL 914  782     956  - - -  HDL-C >39 mg/dL 65  65     73  - - -  Trlycerides 0 - 149 mg/dL 213  086  - - -  Hemoglobin A1c 4.0 - 5.6 % 6.2  6.3  6.0  - 6.6   TCO2 22 - 32 mmol/L - - - 28  -    Details       This result is from an external source.   Multiple values from one day are sorted in reverse-chronological order         Capillary Blood Glucose: Lab Results  Component Value Date   GLUCAP 118 (H) 06/05/2022   GLUCAP 107 (H) 05/24/2022    POCT Glucose     Row Name 01/12/23 0949             POCT Blood Glucose   Pre-Exercise 122 mg/dL                Pulmonary Assessment Scores:  Pulmonary Assessment Scores     Row Name 01/12/23 0956         ADL UCSD   ADL Phase Entry     SOB Score total 19       CAT Score   CAT Score 11       mMRC Score   mMRC Score 2             UCSD: Self-administered rating of dyspnea associated with activities of daily living (ADLs) 6-point scale (0 = "not at all" to 5 = "maximal or unable to do  because of breathlessness")  Scoring Scores range from 0 to 120.  Minimally important difference is 5 units  CAT: CAT can identify the health impairment of COPD patients and is better correlated with disease progression.  CAT has a scoring range of zero to 40. The CAT score is classified into four groups of low (less than 10), medium (10 - 20), high (21-30) and very high (31-40) based on the impact level of disease on health status. A CAT score over 10 suggests significant symptoms.  A worsening CAT score could be explained by an exacerbation, poor medication adherence, poor inhaler technique, or progression of COPD or comorbid conditions.  CAT MCID is 2 points  mMRC: mMRC (Modified Medical Research Council) Dyspnea Scale is used to assess the degree of baseline functional disability in patients of respiratory disease due to dyspnea. No minimal important difference is established. A decrease in score of 1 point or greater is considered a positive change.   Pulmonary Function Assessment:  Pulmonary Function Assessment - 01/12/23 0930       Breath   Bilateral Breath Sounds Decreased    Shortness of Breath Yes;Fear of Shortness of Breath;Limiting activity             Exercise Target Goals: Exercise Program Goal: Individual exercise prescription set using results from initial 6 min walk test and THRR while considering  patient's activity barriers and safety.   Exercise Prescription Goal: Initial exercise prescription builds to 30-45 minutes a day of aerobic activity, 2-3 days per week.  Home exercise guidelines will be given to patient during program as part of exercise prescription that the participant will acknowledge.  Activity Barriers & Risk Stratification:  Activity Barriers & Cardiac Risk Stratification - 01/12/23 0915       Activity Barriers & Cardiac Risk Stratification   Activity Barriers Balance Concerns;Deconditioning;Muscular Weakness;Shortness of Breath              6 Minute Walk:  6 Minute Walk     Row Name 01/12/23 0958         6 Minute Walk   Phase Initial  Distance 1090 feet     Walk Time 6 minutes     # of Rest Breaks 0     MPH 2.06     METS 2.82     RPE 10     Perceived Dyspnea  1     VO2 Peak 9.88     Symptoms No     Resting HR 89 bpm     Resting BP 122/88     Resting Oxygen Saturation  98 %     Exercise Oxygen Saturation  during 6 min walk 95 %     Max Ex. HR 108 bpm     Max Ex. BP 126/72     2 Minute Post BP 114/70       Interval HR   1 Minute HR 104     2 Minute HR 106     3 Minute HR 104     4 Minute HR 106     5 Minute HR 102     6 Minute HR 108     2 Minute Post HR 92     Interval Heart Rate? Yes       Interval Oxygen   Interval Oxygen? Yes     Baseline Oxygen Saturation % 98 %     1 Minute Oxygen Saturation % 95 %     1 Minute Liters of Oxygen 0 L     2 Minute Oxygen Saturation % 97 %     2 Minute Liters of Oxygen 0 L     3 Minute Oxygen Saturation % 98 %     3 Minute Liters of Oxygen 0 L     4 Minute Oxygen Saturation % 97 %     4 Minute Liters of Oxygen 0 L     5 Minute Oxygen Saturation % 99 %     5 Minute Liters of Oxygen 0 L     6 Minute Oxygen Saturation % 100 %     6 Minute Liters of Oxygen 0 L     2 Minute Post Oxygen Saturation % 100 %     2 Minute Post Liters of Oxygen 0 L              Oxygen Initial Assessment:  Oxygen Initial Assessment - 01/12/23 0926       Home Oxygen   Home Oxygen Device None    Sleep Oxygen Prescription None    Home Exercise Oxygen Prescription None    Home Resting Oxygen Prescription None      Initial 6 min Walk   Oxygen Used None      Program Oxygen Prescription   Program Oxygen Prescription None      Intervention   Short Term Goals To learn and understand importance of maintaining oxygen saturations>88%;To learn and demonstrate proper use of respiratory medications;To learn and understand importance of monitoring SPO2 with pulse oximeter and  demonstrate accurate use of the pulse oximeter.;To learn and demonstrate proper pursed lip breathing techniques or other breathing techniques.     Long  Term Goals Maintenance of O2 saturations>88%;Compliance with respiratory medication;Verbalizes importance of monitoring SPO2 with pulse oximeter and return demonstration;Exhibits proper breathing techniques, such as pursed lip breathing or other method taught during program session;Demonstrates proper use of MDI's             Oxygen Re-Evaluation:  Oxygen Re-Evaluation     Row Name 01/18/23 [phone]  Program Oxygen Prescription   Program Oxygen Prescription None         Home Oxygen   Home Oxygen Device None       Sleep Oxygen Prescription None       Home Exercise Oxygen Prescription None       Home Resting Oxygen Prescription None         Goals/Expected Outcomes   Short Term Goals To learn and understand importance of maintaining oxygen saturations>88%;To learn and demonstrate proper use of respiratory medications;To learn and understand importance of monitoring SPO2 with pulse oximeter and demonstrate accurate use of the pulse oximeter.;To learn and demonstrate proper pursed lip breathing techniques or other breathing techniques.        Long  Term Goals Maintenance of O2 saturations>88%;Compliance with respiratory medication;Verbalizes importance of monitoring SPO2 with pulse oximeter and return demonstration;Exhibits proper breathing techniques, such as pursed lip breathing or other method taught during program session;Demonstrates proper use of MDI's       Goals/Expected Outcomes Compliance and understanding of oxygen saturation monitoring and breathing techniques to decrease shortness of breath                Oxygen Discharge (Final Oxygen Re-Evaluation):  Oxygen Re-Evaluation - 01/18/23 0919       Program Oxygen Prescription   Program Oxygen Prescription None      Home Oxygen   Home Oxygen Device None     Sleep Oxygen Prescription None    Home Exercise Oxygen Prescription None    Home Resting Oxygen Prescription None      Goals/Expected Outcomes   Short Term Goals To learn and understand importance of maintaining oxygen saturations>88%;To learn and demonstrate proper use of respiratory medications;To learn and understand importance of monitoring SPO2 with pulse oximeter and demonstrate accurate use of the pulse oximeter.;To learn and demonstrate proper pursed lip breathing techniques or other breathing techniques.     Long  Term Goals Maintenance of O2 saturations>88%;Compliance with respiratory medication;Verbalizes importance of monitoring SPO2 with pulse oximeter and return demonstration;Exhibits proper breathing techniques, such as pursed lip breathing or other method taught during program session;Demonstrates proper use of MDI's    Goals/Expected Outcomes Compliance and understanding of oxygen saturation monitoring and breathing techniques to decrease shortness of breath             Initial Exercise Prescription:  Initial Exercise Prescription - 01/12/23 1000       Date of Initial Exercise RX and Referring Provider   Date 01/12/23    Referring Provider [name]    Expected Discharge Date 04/12/23      Treadmill   MPH 2    Grade 0    Minutes 15      Elliptical   Level 1    Speed 1    Minutes 15      Prescription Details   Frequency (times per week) 2    Duration Progress to 30 minutes of continuous aerobic without signs/symptoms of physical distress      Intensity   THRR 40-80% of Max Heartrate 66-131    Ratings of Perceived Exertion 11-13    Perceived Dyspnea 0-4      Progression   Progression Continue to progress workloads to maintain intensity without signs/symptoms of physical distress.      Resistance Training   Training Prescription Yes    Weight black bands    Reps 10-15             Perform Capillary Blood  Glucose checks as needed.  Exercise  Prescription Changes:   Exercise Comments:   Exercise Goals and Review:   Exercise Goals     Row Name 01/12/23 1610 01/18/23 0919 01/19/23 1541         Exercise Goals   Increase Physical Activity Yes Yes Yes     Intervention Provide advice, education, support and counseling about physical activity/exercise needs.;Develop an individualized exercise prescription for aerobic and resistive training based on initial evaluation findings, risk stratification, comorbidities and participant's personal goals. Provide advice, education, support and counseling about physical activity/exercise needs.;Develop an individualized exercise prescription for aerobic and resistive training based on initial evaluation findings, risk stratification, comorbidities and participant's personal goals. Provide advice, education, support and counseling about physical activity/exercise needs.;Develop an individualized exercise prescription for aerobic and resistive training based on initial evaluation findings, risk stratification, comorbidities and participant's personal goals.     Expected Outcomes Short Term: Attend rehab on a regular basis to increase amount of physical activity.;Long Term: Exercising regularly at least 3-5 days a week.;Long Term: Add in home exercise to make exercise part of routine and to increase amount of physical activity. Short Term: Attend rehab on a regular basis to increase amount of physical activity.;Long Term: Exercising regularly at least 3-5 days a week.;Long Term: Add in home exercise to make exercise part of routine and to increase amount of physical activity. Short Term: Attend rehab on a regular basis to increase amount of physical activity.;Long Term: Exercising regularly at least 3-5 days a week.;Long Term: Add in home exercise to make exercise part of routine and to increase amount of physical activity.     Increase Strength and Stamina Yes Yes Yes     Intervention Provide advice,  education, support and counseling about physical activity/exercise needs.;Develop an individualized exercise prescription for aerobic and resistive training based on initial evaluation findings, risk stratification, comorbidities and participant's personal goals. Provide advice, education, support and counseling about physical activity/exercise needs.;Develop an individualized exercise prescription for aerobic and resistive training based on initial evaluation findings, risk stratification, comorbidities and participant's personal goals. Provide advice, education, support and counseling about physical activity/exercise needs.;Develop an individualized exercise prescription for aerobic and resistive training based on initial evaluation findings, risk stratification, comorbidities and participant's personal goals.     Expected Outcomes Short Term: Increase workloads from initial exercise prescription for resistance, speed, and METs.;Short Term: Perform resistance training exercises routinely during rehab and add in resistance training at home;Long Term: Improve cardiorespiratory fitness, muscular endurance and strength as measured by increased METs and functional capacity ( ) Short Term: Increase workloads from initial exercise prescription for resistance, speed, and METs.;Short Term: Perform resistance training exercises routinely during rehab and add in resistance training at home;Long Term: Improve cardiorespiratory fitness, muscular endurance and strength as measured by increased METs and functional capacity ( ) Short Term: Increase workloads from initial exercise prescription for resistance, speed, and METs.;Short Term: Perform resistance training exercises routinely during rehab and add in resistance training at home;Long Term: Improve cardiorespiratory fitness, muscular endurance and strength as measured by increased METs and functional capacity ( )     Able to understand and use rate of perceived  exertion (RPE) scale Yes Yes Yes     Intervention Provide education and explanation on how to use RPE scale Provide education and explanation on how to use RPE scale Provide education and explanation on how to use RPE scale     Expected Outcomes Short Term: Able to use RPE daily in rehab  to express subjective intensity level;Long Term:  Able to use RPE to guide intensity level when exercising independently Short Term: Able to use RPE daily in rehab to express subjective intensity level;Long Term:  Able to use RPE to guide intensity level when exercising independently Short Term: Able to use RPE daily in rehab to express subjective intensity level;Long Term:  Able to use RPE to guide intensity level when exercising independently     Able to understand and use Dyspnea scale Yes Yes Yes     Intervention Provide education and explanation on how to use Dyspnea scale Provide education and explanation on how to use Dyspnea scale Provide education and explanation on how to use Dyspnea scale     Expected Outcomes Short Term: Able to use Dyspnea scale daily in rehab to express subjective sense of shortness of breath during exertion;Long Term: Able to use Dyspnea scale to guide intensity level when exercising independently Short Term: Able to use Dyspnea scale daily in rehab to express subjective sense of shortness of breath during exertion;Long Term: Able to use Dyspnea scale to guide intensity level when exercising independently Short Term: Able to use Dyspnea scale daily in rehab to express subjective sense of shortness of breath during exertion;Long Term: Able to use Dyspnea scale to guide intensity level when exercising independently     Knowledge and understanding of Target Heart Rate Range (THRR) Yes Yes Yes     Intervention Provide education and explanation of THRR including how the numbers were predicted and where they are located for reference Provide education and explanation of THRR including how the numbers  were predicted and where they are located for reference Provide education and explanation of THRR including how the numbers were predicted and where they are located for reference     Expected Outcomes Short Term: Able to state/look up THRR;Short Term: Able to use daily as guideline for intensity in rehab;Long Term: Able to use THRR to govern intensity when exercising independently Short Term: Able to state/look up THRR;Short Term: Able to use daily as guideline for intensity in rehab;Long Term: Able to use THRR to govern intensity when exercising independently Short Term: Able to state/look up THRR;Short Term: Able to use daily as guideline for intensity in rehab;Long Term: Able to use THRR to govern intensity when exercising independently     Understanding of Exercise Prescription Yes Yes Yes     Intervention Provide education, explanation, and written materials on patient's individual exercise prescription Provide education, explanation, and written materials on patient's individual exercise prescription Provide education, explanation, and written materials on patient's individual exercise prescription     Expected Outcomes Short Term: Able to explain program exercise prescription;Long Term: Able to explain home exercise prescription to exercise independently Short Term: Able to explain program exercise prescription;Long Term: Able to explain home exercise prescription to exercise independently Short Term: Able to explain program exercise prescription;Long Term: Able to explain home exercise prescription to exercise independently              Exercise Goals Re-Evaluation :  Exercise Goals Re-Evaluation     Row Name 01/18/23 0919             Exercise Goal Re-Evaluation   Exercise Goals Review Increase Physical Activity;Able to understand and use Dyspnea scale;Understanding of Exercise Prescription;Increase Strength and Stamina;Knowledge and understanding of Target Heart Rate Range (THRR);Able  to understand and use rate of perceived exertion (RPE) scale       Comments Pt is scheduled  to begin exercise 8/13. Will progress as tolerated.       Expected Outcomes Through exercise at rehab and home, the patient will decrease shortness of breath with daily activities and feel confident in carrying out an exercise regimen at home                Discharge Exercise Prescription (Final Exercise Prescription Changes):   Nutrition:  Target Goals: Understanding of nutrition guidelines, daily intake of sodium 1500mg , cholesterol 200mg , calories 30% from fat and 7% or less from saturated fats, daily to have 5 or more servings of fruits and vegetables.  Biometrics:  Pre Biometrics - 01/12/23 0909       Pre Biometrics   Grip Strength 20 kg              Nutrition Therapy Plan and Nutrition Goals:   Nutrition Assessments:  MEDIFICTS Score Key: ?70 Need to make dietary changes  40-70 Heart Healthy Diet ? 40 Therapeutic Level Cholesterol Diet   Picture Your Plate Scores: <82 Unhealthy dietary pattern with much room for improvement. 41-50 Dietary pattern unlikely to meet recommendations for good health and room for improvement. 51-60 More healthful dietary pattern, with some room for improvement.  >60 Healthy dietary pattern, although there may be some specific behaviors that could be improved.    Nutrition Goals Re-Evaluation:   Nutrition Goals Discharge (Final Nutrition Goals Re-Evaluation):   Psychosocial: Target Goals: Acknowledge presence or absence of significant depression and/or stress, maximize coping skills, provide positive support system. Participant is able to verbalize types and ability to use techniques and skills needed for reducing stress and depression.  Initial Review & Psychosocial Screening:  Initial Psych Review & Screening - 01/12/23 0922       Initial Review   Current issues with None Identified      Family Dynamics   Good Support System?  Yes    Comments Jackilyn states she has good support from her spouse      Barriers   Psychosocial barriers to participate in program There are no identifiable barriers or psychosocial needs.             Quality of Life Scores:  Scores of 19 and below usually indicate a poorer quality of life in these areas.  A difference of  2-3 points is a clinically meaningful difference.  A difference of 2-3 points in the total score of the Quality of Life Index has been associated with significant improvement in overall quality of life, self-image, physical symptoms, and general health in studies assessing change in quality of life.  PHQ-9: Review Flowsheet       01/12/2023 12/15/2019 11/19/2018  Depression screen PHQ 2/9  Decreased Interest 0 3 0  Down, Depressed, Hopeless 0 2 0  PHQ - 2 Score 0 5 0  Altered sleeping 0 3 1  Tired, decreased energy 0 3 1  Change in appetite 0 3 0  Feeling bad or failure about yourself  0 0 0  Trouble concentrating 0 0 0  Moving slowly or fidgety/restless 0 1 0  Suicidal thoughts 0 0 0  PHQ-9 Score 0 15 2  Difficult doing work/chores - Not difficult at all -    Details           Interpretation of Total Score  Total Score Depression Severity:  1-4 = Minimal depression, 5-9 = Mild depression, 10-14 = Moderate depression, 15-19 = Moderately severe depression, 20-27 = Severe depression   Psychosocial Evaluation and  Intervention:  Psychosocial Evaluation - 01/12/23 0926       Psychosocial Evaluation & Interventions   Comments Petrona denies any psychosocial barriers or concerns    Expected Outcomes For Shameca to participate in PR without any psychosocial barriers or concerns    Continue Psychosocial Services  No Follow up required             Psychosocial Re-Evaluation:  Psychosocial Re-Evaluation     Row Name 01/19/23 1457             Psychosocial Re-Evaluation   Current issues with None Identified       Comments No changes since  Kiarah's orientation appointment. Cheslea will start the program on 8/13/20242       Expected Outcomes For Paulett to participate in RP without any psychosocial barriers or concerns       Interventions Encouraged to attend Pulmonary Rehabilitation for the exercise       Continue Psychosocial Services  No Follow up required                Psychosocial Discharge (Final Psychosocial Re-Evaluation):  Psychosocial Re-Evaluation - 01/19/23 1457       Psychosocial Re-Evaluation   Current issues with None Identified    Comments No changes since Annalucia's orientation appointment. Carlene will start the program on 8/13/20242    Expected Outcomes For Tamie to participate in RP without any psychosocial barriers or concerns    Interventions Encouraged to attend Pulmonary Rehabilitation for the exercise    Continue Psychosocial Services  No Follow up required             Education: Education Goals: Education classes will be provided on a weekly basis, covering required topics. Participant will state understanding/return demonstration of topics presented.  Learning Barriers/Preferences:  Learning Barriers/Preferences - 01/12/23 1002       Learning Barriers/Preferences   Learning Barriers None    Learning Preferences Individual Instruction;Written Material;Verbal Instruction             Education Topics: Introduction to Pulmonary Rehab Group instruction provided by PowerPoint, verbal discussion, and written material to support subject matter. Instructor reviews what Pulmonary Rehab is, the purpose of the program, and how patients are referred.     Know Your Numbers Group instruction that is supported by a PowerPoint presentation. Instructor discusses importance of knowing and understanding resting, exercise, and post-exercise oxygen saturation, heart rate, and blood pressure. Oxygen saturation, heart rate, blood pressure, rating of perceived exertion, and dyspnea are reviewed along  with a normal range for these values.    Exercise for the Pulmonary Patient Group instruction that is supported by a PowerPoint presentation. Instructor discusses benefits of exercise, core components of exercise, frequency, duration, and intensity of an exercise routine, importance of utilizing pulse oximetry during exercise, safety while exercising, and options of places to exercise outside of rehab.       MET Level  Group instruction provided by PowerPoint, verbal discussion, and written material to support subject matter. Instructor reviews what METs are and how to increase METs.    Pulmonary Medications Verbally interactive group education provided by instructor with focus on inhaled medications and proper administration.   Anatomy and Physiology of the Respiratory System Group instruction provided by PowerPoint, verbal discussion, and written material to support subject matter. Instructor reviews respiratory cycle and anatomical components of the respiratory system and their functions. Instructor also reviews differences in obstructive and restrictive respiratory diseases with examples of each.  Oxygen Safety Group instruction provided by PowerPoint, verbal discussion, and written material to support subject matter. There is an overview of "What is Oxygen" and "Why do we need it".  Instructor also reviews how to create a safe environment for oxygen use, the importance of using oxygen as prescribed, and the risks of noncompliance. There is a brief discussion on traveling with oxygen and resources the patient may utilize.   Oxygen Use Group instruction provided by PowerPoint, verbal discussion, and written material to discuss how supplemental oxygen is prescribed and different types of oxygen supply systems. Resources for more information are provided.    Breathing Techniques Group instruction that is supported by demonstration and informational handouts. Instructor discusses the  benefits of pursed lip and diaphragmatic breathing and detailed demonstration on how to perform both.     Risk Factor Reduction Group instruction that is supported by a PowerPoint presentation. Instructor discusses the definition of a risk factor, different risk factors for pulmonary disease, and how the heart and lungs work together.   MD Day A group question and answer session with a medical doctor that allows participants to ask questions that relate to their pulmonary disease state.   Nutrition for the Pulmonary Patient Group instruction provided by PowerPoint slides, verbal discussion, and written materials to support subject matter. The instructor gives an explanation and review of healthy diet recommendations, which includes a discussion on weight management, recommendations for fruit and vegetable consumption, as well as protein, fluid, caffeine, fiber, sodium, sugar, and alcohol. Tips for eating when patients are short of breath are discussed.    Other Education Group or individual verbal, written, or video instructions that support the educational goals of the pulmonary rehab program.    Knowledge Questionnaire Score:  Knowledge Questionnaire Score - 01/12/23 1002       Knowledge Questionnaire Score   Pre Score 17/18             Core Components/Risk Factors/Patient Goals at Admission:  Personal Goals and Risk Factors at Admission - 01/12/23 0919       Core Components/Risk Factors/Patient Goals on Admission    Weight Management Weight Loss;Yes    Intervention Weight Management: Develop a combined nutrition and exercise program designed to reach desired caloric intake, while maintaining appropriate intake of nutrient and fiber, sodium and fats, and appropriate energy expenditure required for the weight goal.;Weight Management: Provide education and appropriate resources to help participant work on and attain dietary goals.;Weight Management/Obesity: Establish reasonable  short term and long term weight goals.;Obesity: Provide education and appropriate resources to help participant work on and attain dietary goals.    Admit Weight 192 lb 9.6 oz (87.4 kg)    Improve shortness of breath with ADL's Yes    Intervention Provide education, individualized exercise plan and daily activity instruction to help decrease symptoms of SOB with activities of daily living.    Expected Outcomes Short Term: Improve cardiorespiratory fitness to achieve a reduction of symptoms when performing ADLs;Long Term: Be able to perform more ADLs without symptoms or delay the onset of symptoms    Increase knowledge of respiratory medications and ability to use respiratory devices properly  Yes    Intervention Provide education and demonstration as needed of appropriate use of medications, inhalers, and oxygen therapy.    Expected Outcomes Short Term: Achieves understanding of medications use. Understands that oxygen is a medication prescribed by physician. Demonstrates appropriate use of inhaler and oxygen therapy.;Long Term: Maintain appropriate use of medications, inhalers, and  oxygen therapy.             Core Components/Risk Factors/Patient Goals Review:   Goals and Risk Factor Review     Row Name 01/22/23 1551             Core Components/Risk Factors/Patient Goals Review   Personal Goals Review Improve shortness of breath with ADL's;Develop more efficient breathing techniques such as purse lipped breathing and diaphragmatic breathing and practicing self-pacing with activity.;Increase knowledge of respiratory medications and ability to use respiratory devices properly.;Weight Management/Obesity       Review Unable to assess, Nariya has not yet started the program but is scheduled to start on 01/30/2023       Expected Outcomes See admission goals                Core Components/Risk Factors/Patient Goals at Discharge (Final Review):   Goals and Risk Factor Review - 01/22/23  1551       Core Components/Risk Factors/Patient Goals Review   Personal Goals Review Improve shortness of breath with ADL's;Develop more efficient breathing techniques such as purse lipped breathing and diaphragmatic breathing and practicing self-pacing with activity.;Increase knowledge of respiratory medications and ability to use respiratory devices properly.;Weight Management/Obesity    Review Unable to assess, Kaleshia has not yet started the program but is scheduled to start on 01/30/2023    Expected Outcomes See admission goals             ITP Comments:Pt is making expected progress toward Pulmonary Rehab goals after completing 0 sessions. Recommend continued exercise, life style modification, education, and utilization of breathing techniques to increase stamina and strength, while also decreasing shortness of breath with exertion.  Dr. Mechele Collin is Medical Director for Pulmonary Rehab at [facility].     Comments: Dr. Mechele Collin is Medical Director for Pulmonary Rehab at [facility].

## 2023-01-30 ENCOUNTER — Other Ambulatory Visit (HOSPITAL_COMMUNITY)
Admission: RE | Admit: 2023-01-30 | Discharge: 2023-01-30 | Disposition: A | Discharge status: Home / Self Care | Payer: BC Managed Care – PPO | Source: Ambulatory Visit | Attending: Obstetrics and Gynecology | Admitting: Obstetrics and Gynecology

## 2023-01-30 ENCOUNTER — Ambulatory Visit (INDEPENDENT_AMBULATORY_CARE_PROVIDER_SITE_OTHER): Payer: BC Managed Care – PPO | Admitting: Obstetrics and Gynecology

## 2023-01-30 ENCOUNTER — Encounter (HOSPITAL_COMMUNITY)
Admission: RE | Admit: 2023-01-30 | Discharge: 2023-01-30 | Disposition: A | Discharge status: Home / Self Care | Payer: BC Managed Care – PPO | Source: Ambulatory Visit | Attending: Pulmonary Disease | Admitting: Pulmonary Disease

## 2023-01-30 ENCOUNTER — Encounter: Payer: Self-pay | Admitting: Obstetrics and Gynecology

## 2023-01-30 VITALS — BP 132/88 | HR 88 | Ht 61.0 in | Wt 191.0 lb

## 2023-01-30 VITALS — Wt 190.5 lb

## 2023-01-30 DIAGNOSIS — N9089 Other specified noninflammatory disorders of vulva and perineum: Secondary | ICD-10-CM | POA: Insufficient documentation

## 2023-01-30 DIAGNOSIS — D869 Sarcoidosis, unspecified: Secondary | ICD-10-CM | POA: Diagnosis not present

## 2023-01-30 NOTE — Patient Instructions (Signed)
 Vulva Biopsy, Care After The following information offers guidance on how to care for yourself after your procedure. Your health care provider may also give you more specific instructions. If you have problems or questions, contact your health care provider. What can I expect after the procedure? After the procedure, it is common to have: Slight bleeding from the biopsy site. Slight pain or discomfort at the biopsy site. Follow these instructions at home: Biopsy site care  Follow instructions from your health care provider about how to take care of your biopsy site. Make sure you: Clean the area using water and mild soap twice a day or as told by your health care provider. Gently pat the area dry. You may shower 24 hours after the procedure. If you were prescribed an antibiotic ointment, apply it as told by your health care provider. Do not stop using the antibiotic even if your condition improves. If told by your health care provider, take a sitz bath to help with pain and discomfort. This is a warm water bath that you take while sitting down. Do this as often as told by your health care provider. The water should only come up to your hips and cover your buttocks. You may pat the area dry with a soft, clean towel. Leave stitches (sutures), skin glue, or adhesive strips in place. These skin closures may need to stay in place for 2 weeks or longer. If adhesive strip edges start to loosen and curl up, you may trim the loose edges. Do not remove adhesive strips completely unless your health care provider tells you to do that. Check your biopsy site every day for signs of infection. It may be helpful to use a handheld mirror to do this. Check for: Redness, swelling, or more pain. More fluid or blood. Warmth. Pus or a bad smell. Do not rub the biopsy area after urinating. Gently pat the area dry or use a bottle filled with warm water (peri bottle) to clean the area. Gently wipe from front to  back. Lifestyle Wear loose, cotton underwear. Do not wear tight pants. For at least 1 week or until your health care provider approves: Do not use tampons, douche, or put anything inside your vagina. Do not have sex. Until your health care provider approves: Do not exercise, such as running or biking. Do not swim or use a hot tub. General instructions Take over-the-counter and prescription medicines only as told by your health care provider. Drink enough fluid to keep your urine pale yellow. Use a sanitary napkin until the bleeding stops. If told, put ice on the biopsy site. To do this: Place ice in a plastic bag. Place a towel between your skin and the bag. Leave the ice on for 20 minutes, 2-3 times a day. Remove the ice if your skin turns bright red. This is very important. If you cannot feel pain, heat, or cold, you have a greater risk of damage to the area. Keep all follow-up visits. This is important. Contact a health care provider if: You have redness, swelling, or more pain around your biopsy site. You have more fluid or blood coming from your biopsy site. Your biopsy site feels warm to the touch. Your pain is not controlled with medicine or ice packs. You have a fever or chills. Get help right away if: You have heavy bleeding from the vulva. You have pus or a bad smell coming from the biopsy site. You have abdominal pain. Summary After the procedure, it  is common to have slight bleeding and discomfort at the biopsy site. Follow instructions from your health care provider after your biopsy. Take sitz baths as told by your health care provider to help with pain and discomfort. Leave any sutures in place. Contact your health care provider if you notice any signs of infection around the biopsy site, including redness, swelling, more pain, more fluid or blood, or warmth. Keep all follow-up visits. This is important. This information is not intended to replace advice given to you  by your health care provider. Make sure you discuss any questions you have with your health care provider. Document Revised: 02/22/2021 Document Reviewed: 02/22/2021 Elsevier Patient Education  2024 [employer].

## 2023-01-30 NOTE — Progress Notes (Signed)
Daily Session Note  Patient Details  Name: [name] MRN: [id number] Date of Birth: 1966/06/18 Referring Provider:   Doristine Devoid Pulmonary Rehab Walk Test from 01/12/2023 in [facility]  Referring Provider [name]       Encounter Date: 01/30/2023  Check In:  Session Check In - 01/30/23 1335       Check-In   Supervising physician immediately available to respond to emergencies [facility] MD immediately available    Physician(s) Jari Favre, PA    Location [facility]    Staff Present Raford Pitcher, MS, ACSM-CEP, Exercise Physiologist;Randi Dionisio Paschal, ACSM-CEP, Exercise Physiologist;Samantha Belarus, RD, Dutch Gray, RN, Fuller Plan, RT    Virtual Visit No    Medication changes reported     No    Fall or balance concerns reported    No    Tobacco Cessation No Change    Warm-up and Cool-down Performed as group-led instruction    Resistance Training Performed Yes    VAD Patient? No    PAD/SET Patient? No      Pain Assessment   Currently in Pain? No/denies    Multiple Pain Sites No             Capillary Blood Glucose: No results found for this or any previous visit (from the past 24 hour(s)).    Social History   Tobacco Use  Smoking Status Never   Passive exposure: Never  Smokeless Tobacco Never    Goals Met:  Exercise tolerated well No report of concerns or symptoms today Strength training completed today  Goals Unmet:  Not Applicable  Comments: Service time is from 1320 to 1428    Dr. Mechele Collin is Medical Director for Pulmonary Rehab at [facility].

## 2023-02-01 ENCOUNTER — Encounter (HOSPITAL_COMMUNITY): Payer: BC Managed Care – PPO

## 2023-02-01 ENCOUNTER — Other Ambulatory Visit: Payer: Self-pay | Admitting: Pulmonary Disease

## 2023-02-01 ENCOUNTER — Encounter (HOSPITAL_BASED_OUTPATIENT_CLINIC_OR_DEPARTMENT_OTHER): Payer: Self-pay | Admitting: Pulmonary Disease

## 2023-02-01 ENCOUNTER — Telehealth (HOSPITAL_COMMUNITY): Payer: Self-pay | Admitting: *Deleted

## 2023-02-01 ENCOUNTER — Ambulatory Visit (INDEPENDENT_AMBULATORY_CARE_PROVIDER_SITE_OTHER): Payer: BC Managed Care – PPO | Admitting: Pulmonary Disease

## 2023-02-01 VITALS — BP 130/90 | HR 79 | Resp 16 | Ht 61.0 in | Wt 189.4 lb

## 2023-02-01 DIAGNOSIS — R5383 Other fatigue: Secondary | ICD-10-CM | POA: Diagnosis not present

## 2023-02-01 DIAGNOSIS — D869 Sarcoidosis, unspecified: Secondary | ICD-10-CM | POA: Diagnosis not present

## 2023-02-01 DIAGNOSIS — Z79899 Other long term (current) drug therapy: Secondary | ICD-10-CM | POA: Diagnosis not present

## 2023-02-01 MED ORDER — BREZTRI AEROSPHERE 160-9-4.8 MCG/ACT IN AERO: 2.0000 | INHALATION_SPRAY | Freq: Two times a day (BID) | RESPIRATORY_TRACT | 5 refills | Status: DC

## 2023-02-01 MED ORDER — PREDNISONE 5 MG PO TABS: 10.0000 mg | ORAL_TABLET | Freq: Every day | ORAL | 2 refills | Status: DC

## 2023-02-01 MED ORDER — ONDANSETRON HCL 4 MG PO TABS: 4.0000 mg | ORAL_TABLET | Freq: Every day | ORAL | 0 refills | Status: DC

## 2023-02-01 NOTE — Patient Instructions (Addendum)
Sarcoid with deconditioning High risk medication management --Continue methotrexate 20 mg weekly --Increase prednisone back up to 10 mg daily with plan to enroll in [facility] --Take omeprazole 20 mg daily  --Will contact [facility]  --Labs ordered below --Plan to reassess with PET in 6 months (November 2024)  Shortness of breath --PFTs and echo normal --CONTINUE Breztri ONE puff TWICE a day. Prescription ordered.  History of OSA Fatigue, excessive daytime sleepiness --RE-ORDER overnight oximetry on room air. Please call our office after test is complete so we can retrieve results --If abnormal, will need sleep study

## 2023-02-01 NOTE — Telephone Encounter (Signed)
Retreive message from Web designer.  Pt will be absent from Pulmonary Rehab.  No reason provided. [name], BSN Cardiac and Emergency planning/management officer

## 2023-02-01 NOTE — Progress Notes (Signed)
Synopsis: Referred in April 2024 for abnormal PET scan, adenopathy by Fatima Sanger, FNP  Subjective:   PATIENT ID: [name] GENDER: female DOB: [birth year], MRN: [id number]  Chief Complaint  Patient presents with   Follow-up    2 month fu sarcoidosis- prednisone made her achy more and she is still tired all the time   Synopsis: This is a 57 year old female, past medical history of back pain, constipation, type 2 diabetes, hypertension, kidney stones.  Presents with a new diagnosis of sarcoidosis after a inguinal node excision that shows not nonnecrotizing granulomas.  She has abnormal pet imaging that shows mediastinal and hilar adenopathy as well as splenic involvement. She reports that she has had difficulty concentrating, fatigue and brain fog for over a year.  She was started on prednisone and methotrexate by rheumatology.  She has had an eye exam and there is no evidence of uveitis.     11/27/22 She is a new patient to me to establish care for sarcoid management. She has been on methotrexate 20 mg weekly since 10/22/22. This was started by Rheumatology. Currently still on prednisone 10 mg, has been this medication since March 2024. Feels fatigued prior to her weekly dosing of methotrexate on Sunday and then will feel worse after taking it on Mondays. She currently is followed by GI for belching and nausea and has had negative work-up. She continues to have shortness of breath with exertion. She reports brain fog and balance issues sometimes. Denies falling. Will need to hold onto things due to floating sensation at times.   02/01/23 Since our last visit she has been enrolled in Pulmonary rehab. She continues methotrexate 20 mg and folic acid daily. She felt benefit with Breztri ONE puff twice a day. Requesting refills because it helped with her shortness of breath and tolerating walking up steps easier. Denies cough or wheezing. Cough has nearly resolved. Did feel more fatigued when  stepped down to prednisone 5 mg. Started taking advil for general body aches and pains. Continues to have night sweats.  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   Migraines    Nausea in adult    S/P breast biopsy 04/06/2021   right breast biopsy   Sarcoidosis    Shortness of breath    Stress    Subclinical hypothyroidism 09/2009     Family History  Problem Relation Age of Onset   Diabetes Mother    Hyperlipidemia Mother    Thyroid disease Mother    Anxiety disorder Mother    Obesity Mother    Aortic stenosis Mother    Arthritis-Osteo Sister    Colon cancer Paternal Grandmother    Heart disease Paternal Grandfather    Pancreatic cancer Neg Hx    Stomach cancer Neg Hx    Liver disease Neg Hx    Esophageal cancer Neg Hx      Past Surgical History:  Procedure Laterality Date   BLADDER SUSPENSION N/A 06/05/2022   Procedure: TRANSVAGINAL TAPE (TVT) PROCEDURE;  Surgeon: Marguerita Beards, MD;  Location: [facility] [place];  Service: Gynecology;  Laterality: N/A;  total time  requested is 1 hour   COLONOSCOPY     CYSTOSCOPY N/A 06/05/2022   Procedure: CYSTOSCOPY;  Surgeon: Marguerita Beards, MD;  Location: [facility];  Service: Gynecology;  Laterality: N/A;   LITHOTRIPSY     LYMPH NODE DISSECTION     NOVASURE ABLATION  12/18/2006   RADIOACTIVE SEED GUIDED EXCISIONAL BREAST BIOPSY Right 05/30/2021   Procedure: RADIOACTIVE SEED GUIDED EXCISIONAL RIGHT BREAST BIOPSY;  Surgeon: Emelia Loron, MD;  Location: [facility];  Service: General;  Laterality: Right;   ROTATOR CUFF REPAIR Right 06/20/2007    Social History   Socioeconomic History   Marital status: Married     Spouse name: Alinda Money   Number of children: 2   Years of education: Not on file   Highest education level: Bachelor's degree (e.g., BA, AB, BS)  Occupational History   Occupation: Hydrologist   Occupation: Chemical engineer  Tobacco Use   Smoking status: Never    Passive exposure: Never   Smokeless tobacco: Never  Vaping Use   Vaping status: Never Used  Substance and Sexual Activity   Alcohol use: No    Alcohol/week: 0.0 standard drinks of alcohol   Drug use: No   Sexual activity: Yes    Partners: Male    Birth control/protection: Surgical    Comment: Ablation, Vasectomy  Other Topics Concern   Not on file  Social History Narrative   Right handed   Drinks coffee   Lives with husband   Two story home   unemployed   Social Determinants of Health   Financial Resource Strain: Not on file  Food Insecurity: Not on file  Transportation Needs: Not on file  Physical Activity: Not on file  Stress: Not on file  Social Connections: Unknown (11/01/2021)   Received from [facility], [facility]   Social Network    Social Network: Not on file  Intimate Partner Violence: Unknown (09/23/2021)   Received from [facility], [facility]   HITS    Physically Hurt: Not on file    Insult or Talk Down To: Not on file    Threaten Physical Harm: Not on file    Scream or Curse: Not on file     Allergies  Allergen Reactions   Tizanidine Hcl Other (See Comments)    Cant move Cant move   Clarithromycin Nausea Only    Severe stomach cramps Severe stomach cramps     Outpatient Medications Prior to Visit  Medication Sig Dispense Refill   Budeson-Glycopyrrol-Formoterol (BREZTRI AEROSPHERE) 160-9-4.8 MCG/ACT AERO Inhale 2 puffs into the lungs in the morning and at bedtime. 10.7 g 0   clobetasol ointment (TEMOVATE) 0.05 % Apply 1 Application topically 2 (two) times daily. Use for 2 weeks at a time for a flare.  You may use the cream twice a week at  bedtime for maintenance dosing. 60 g 1   folic acid (FOLVITE) 1 MG tablet Take 2 tablets (2 mg total) by mouth daily. 180 tablet 11   metFORMIN (GLUCOPHAGE-XR) 500 MG 24 hr tablet Take 2 tablets (1,000 mg total) by mouth daily before supper. 180 tablet 3   methotrexate (RHEUMATREX) 2.5 MG tablet Take 8 tablets (20 mg total) by mouth once a week. 96 tablet 1   omeprazole (PRILOSEC) 20 MG capsule Take 1 capsule (20 mg total) by mouth daily. 90 capsule 5   ondansetron (ZOFRAN) 4 MG tablet 1 tablet Orally Once a day for 30 day(s)  predniSONE (DELTASONE) 5 MG tablet Take 1 tablet (5 mg total) by mouth daily with breakfast. 30 tablet 2   tirzepatide (MOUNJARO) 5 MG/0.5ML Pen Inject 5 mg into the skin once a week. 6 mL 3   valACYclovir (VALTREX) 1000 MG tablet Take 2 Tablets By Mouth Every 12 Hours for 24 hours as needed for an outbreak. 30 tablet 1   Vibegron 75 MG TABS Take 1 tablet (75 mg total) by mouth daily. 90 tablet 3   No facility-administered medications prior to visit.    Review of Systems  Constitutional:  Positive for malaise/fatigue. Negative for chills, diaphoresis, fever and weight loss.  HENT:  Negative for congestion.   Respiratory:  Positive for shortness of breath. Negative for cough, hemoptysis, sputum production and wheezing.   Cardiovascular:  Negative for chest pain, palpitations and leg swelling.     Objective:   Vitals:   02/01/23 0906  BP: (!) 130/90  Pulse: 79  Resp: 16  SpO2: 97%  Weight: 189 lb 6.4 oz (85.9 kg)  Height: 5\' 1"  (1.549 m)   97% on RA BMI Readings from Last 3 Encounters:  02/01/23 35.79 kg/m  01/30/23 36.09 kg/m  01/12/23 35.80 kg/m   Wt Readings from Last 3 Encounters:  02/01/23 189 lb 6.4 oz (85.9 kg)  01/30/23 191 lb (86.6 kg)  01/12/23 192 lb 9.6 oz (87.4 kg)   Physical Exam: General: Well-appearing, no acute distress HENT: [place], AT Eyes: EOMI, no scleral icterus Respiratory: Clear to auscultation bilaterally.  No crackles,  wheezing or rales Cardiovascular: RRR, -M/R/G, no JVD Extremities:-Edema,-tenderness Neuro: AAO x4, CNII-XII grossly intact Psych: Normal mood, normal affect   Data Reviewed:  Labs:    Latest Ref Rng & Units 11/27/2022   11:18 AM 06/05/2022    9:12 AM 05/19/2022    3:27 PM  CBC  WBC 3.4 - 10.8 x10E3/uL 12.5   9.5   Hemoglobin 11.1 - 15.9 g/dL 16.1  09.6  04.5   Hematocrit 34.0 - 46.6 % 43.1  42.0  42.3   Platelets 150 - 400 K/uL   357        Latest Ref Rng & Units 11/27/2022   11:18 AM 06/05/2022    9:12 AM 05/19/2022    3:27 PM  CMP  Glucose 70 - 99 mg/dL 409  811  94   BUN 6 - 24 mg/dL 11  9  15    Creatinine 0.57 - 1.00 mg/dL 9.14  7.82  9.56   Sodium 134 - 144 mmol/L 141  140  138   Potassium 3.5 - 5.2 mmol/L 5.0  3.9  3.7   Chloride 96 - 106 mmol/L 101  100  101   CO2 20 - 29 mmol/L 25   29   Calcium 8.7 - 10.2 mg/dL 21.3   08.6   Total Protein 6.0 - 8.5 g/dL 7.0   7.8   Total Bilirubin 0.0 - 1.2 mg/dL 0.5   0.4   Alkaline Phos 44 - 121 IU/L 82   79   AST 0 - 40 IU/L 24   31   ALT 0 - 32 IU/L 25   34   Stable electrolytes, kidney function and liver  Labs from 10/12/22 OSH provided by patient on phone: CBC with diff, CMET, TSH - overall within normal limits except for hyperglycemia  Chest Imaging:  05/24/2022 which reveals hypermetabolic adenopathy and splenic involvement consistent with sarcoid. CT Chest 09/13/22 - Stable 5 mm left upper lobe pulmonary nodule.  Stable mediastinal and hilar lymph nodes. Overall normal parenchyma  Pulmonary Functions Testing Results:    Latest Ref Rng & Units 11/17/2022    8:44 AM  PFT Results  FVC-Pre L 2.41   FVC-Predicted Pre % 77   FVC-Post L 2.44   FVC-Predicted Post % 78   Pre FEV1/FVC % % 86   Post FEV1/FCV % % 87   FEV1-Pre L 2.07   FEV1-Predicted Pre % 85   FEV1-Post L 2.11   DLCO uncorrected ml/min/mmHg 19.26   DLCO UNC% % 102   DLCO corrected ml/min/mmHg 19.26   DLCO COR %Predicted % 102   DLVA Predicted % 118    TLC L 4.15   TLC % Predicted % 88   RV % Predicted % 96   11/17/22 - Interpretation: Normal PFTs. 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 Correlation with clinical and radiological findings is recommended.   Echocardiogram:  11/16/22 - Normal EF.      Assessment & Plan:   No diagnosis found. Discussion: Patient has had evidence of sarcoid since 05/24/2022 with extensive hypermetabolic lymphadenopathy involving the chest, abdomen and pelvis and hepatic and diffuse splenic hypermetabolic disease. Her symptoms worsened to the point of needing immunosuppression starting in Feb 2024 initiated by her Rheumatologist. Sarcoids remains uncontrolled on methotrexate 20 mg and prednisone 5 mg. Will increase prednisone to 10 mg daily. Plan to enroll in [facility].  Sarcoid with deconditioning High risk medication management --Continue methotrexate 20 mg weekly --Increase prednisone back up to 10 mg daily with plan to enroll in [facility] --Take omeprazole 20 mg daily  --Will contact [facility]  --Labs ordered below --Plan to reassess with PET in 6 months (November 2024)  Shortness of breath --PFTs and echo normal --CONTINUE Breztri ONE puff TWICE a day. Prescription ordered.  History of OSA Fatigue, excessive daytime sleepiness --RE-ORDER overnight oximetry on room air. Please call our office after test is complete so we can retrieve results --If abnormal, will need sleep study  Brain fog --Scheduled with [facility] Neurosarcoid clinic [facility] Champ Mungo, MD @ [facility]) in January  Current Outpatient Medications:    Budeson-Glycopyrrol-Formoterol (BREZTRI AEROSPHERE) 160-9-4.8 MCG/ACT AERO, Inhale 2 puffs into the lungs in the morning and at bedtime., Disp: 10.7 g, Rfl: 0   clobetasol ointment (TEMOVATE) 0.05 %, Apply 1 Application topically 2 (two) times daily. Use for 2 weeks at a  time for a flare.  You may use the cream twice a week at bedtime for maintenance dosing., Disp: 60 g, Rfl: 1   folic acid (FOLVITE) 1 MG tablet, Take 2 tablets (2 mg total) by mouth daily., Disp: 180 tablet, Rfl: 11   metFORMIN (GLUCOPHAGE-XR) 500 MG 24 hr tablet, Take 2 tablets (1,000 mg total) by mouth daily before supper., Disp: 180 tablet, Rfl: 3   methotrexate (RHEUMATREX) 2.5 MG tablet, Take 8 tablets (20 mg total) by mouth once a week., Disp: 96 tablet, Rfl: 1   omeprazole (PRILOSEC) 20 MG capsule, Take 1 capsule (20 mg total) by mouth daily., Disp: 90 capsule, Rfl: 5   ondansetron (ZOFRAN) 4 MG tablet, 1 tablet Orally Once a day for 30 day(s), Disp: , Rfl:    predniSONE (DELTASONE) 5 MG tablet, Take 1 tablet (5 mg total) by mouth daily with breakfast., Disp: 30 tablet, Rfl: 2   tirzepatide (MOUNJARO) 5 MG/0.5ML Pen, Inject 5 mg into the skin once a week.,  Disp: 6 mL, Rfl: 3   valACYclovir (VALTREX) 1000 MG tablet, Take 2 Tablets By Mouth Every 12 Hours for 24 hours as needed for an outbreak., Disp: 30 tablet, Rfl: 1   Vibegron 75 MG TABS, Take 1 tablet (75 mg total) by mouth daily., Disp: 90 tablet, Rfl: 3  I have spent a total time of 45-minutes on the day of the appointment including chart review, data review, collecting history, coordinating care and discussing medical diagnosis and plan with the patient/family. Past medical history, allergies, medications were reviewed. Pertinent imaging, labs and tests included in this note have been reviewed and interpreted independently by me.   Mechele Collin, MD [facility] 02/01/2023 9:32 AM

## 2023-02-02 ENCOUNTER — Encounter: Payer: Self-pay | Admitting: Psychology

## 2023-02-02 ENCOUNTER — Institutional Professional Consult (permissible substitution): Payer: BC Managed Care – PPO | Admitting: Psychology

## 2023-02-02 ENCOUNTER — Ambulatory Visit: Payer: BC Managed Care – PPO

## 2023-02-02 ENCOUNTER — Telehealth: Payer: Self-pay | Admitting: Pharmacist

## 2023-02-02 ENCOUNTER — Ambulatory Visit (INDEPENDENT_AMBULATORY_CARE_PROVIDER_SITE_OTHER): Payer: BC Managed Care – PPO | Admitting: Psychology

## 2023-02-02 DIAGNOSIS — D869 Sarcoidosis, unspecified: Secondary | ICD-10-CM | POA: Diagnosis not present

## 2023-02-02 DIAGNOSIS — G3184 Mild cognitive impairment, so stated: Secondary | ICD-10-CM

## 2023-02-02 DIAGNOSIS — G43909 Migraine, unspecified, not intractable, without status migrainosus: Secondary | ICD-10-CM | POA: Insufficient documentation

## 2023-02-02 DIAGNOSIS — E7800 Pure hypercholesterolemia, unspecified: Secondary | ICD-10-CM | POA: Insufficient documentation

## 2023-02-02 DIAGNOSIS — R4189 Other symptoms and signs involving cognitive functions and awareness: Secondary | ICD-10-CM

## 2023-02-02 HISTORY — DX: Mild cognitive impairment of uncertain or unknown etiology: G31.84

## 2023-02-02 NOTE — Progress Notes (Signed)
   Psychometrician Note   Cognitive testing was administered to [name] by Shan Levans, B.S. (psychometrist) under the supervision of Dr. Newman Nickels, Ph.D., licensed psychologist on 02/02/2023. [name] did not appear overtly distressed by the testing session per behavioral observation or responses across self-report questionnaires. Rest breaks were offered.    The battery of tests administered was selected by Dr. Newman Nickels, Ph.D. with consideration to [name]'s current level of functioning, the nature of her symptoms, emotional and behavioral responses during interview, level of literacy, observed level of motivation/effort, and the nature of the referral question. This battery was communicated to the psychometrist. Communication between Dr. Newman Nickels, Ph.D. and the psychometrist was ongoing throughout the evaluation and Dr. Newman Nickels, Ph.D. was immediately accessible at all times. Dr. Newman Nickels, Ph.D. provided supervision to the psychometrist on the date of this service to the extent necessary to assure the quality of all services provided.    [name] will return within approximately 1-2 weeks for an interactive feedback session with Dr. Milbert Coulter at which time her test performances, clinical impressions, and treatment recommendations will be reviewed in detail. [name] understands she can contact our office should she require our assistance before this time.  A total of 125 minutes of billable time were spent face-to-face with [name] by the psychometrist. This includes both test administration and scoring time. Billing for these services is reflected in the clinical report generated by Dr. Newman Nickels, Ph.D.  This note reflects time spent with the psychometrician and does not include test scores or any clinical interpretations made by Dr. Milbert Coulter. The full report will follow in a separate note.

## 2023-02-02 NOTE — Progress Notes (Signed)
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 71 --- Elevated    Anxiety 40 --- Within Normal Limits    Anxiety-Related Disorders 55 --- Within Normal Limits    Depression 62 --- Within Normal Limits    Mania 32 --- Within Normal Limits    Paranoia 39 --- Within Normal Limits    Schizophrenia 58 --- Within Normal Limits    Borderline Features 39 --- Within Normal Limits    Antisocial Features 38 --- Within Normal Limits    Alcohol Problems 41 --- Within Normal Limits    Drug Problems 42 --- Within Normal Limits    Aggression 40 --- Within Normal Limits    Suicidal Ideation 43 --- Within Normal Limits    Stress 44 --- Within Normal Limits    Non Support 42 --- Within Normal Limits    Treatment Rejection 61 --- Within Normal Limits    Dominance 45 --- Within Normal Limits    Warmth 49 --- Within Normal Limits       Additional  Questionnaires:      Raw Score Percentile   PROMIS Sleep Disturbance Questionnaire: 34 --- Moderate   Informed Consent and Coding/Compliance:   The current evaluation represents a clinical  evaluation for the purposes previously outlined by the referral source and is in no way reflective of a forensic evaluation.   Ms. Hellams was provided with a verbal description of the nature and purpose of the present neuropsychological evaluation. Also reviewed were the foreseeable risks and/or discomforts and benefits of the procedure, limits of confidentiality, and mandatory reporting requirements of this provider. The patient was given the opportunity to ask questions and receive answers about the evaluation. Oral consent to participate was provided by the patient.   This evaluation was conducted by Newman Nickels, Ph.D., ABPP-CN, board certified clinical neuropsychologist. Ms. Terrill completed a clinical interview with Dr. Milbert Coulter, billed as one unit [phone], and 125 minutes of cognitive testing and scoring, billed as one unit [phone] and three additional units [id number]. Psychometrist Shan Levans, B.S. assisted Dr. Milbert Coulter with test administration and scoring procedures. As a separate and discrete service, one unit [id number] and two units [phone] were billed for Dr. Tammy Sours time spent in interpretation and report writing.

## 2023-02-02 NOTE — Telephone Encounter (Signed)
Received secure chat from Dr. [name] - pt will be new start to Acthar  Dose: 40mg  SQ every 72 hours  Referral form to be completed. Will await paperwork  Chesley Mires, PharmD, MPH, BCPS, CPP Clinical Pharmacist (Rheumatology and Pulmonology)

## 2023-02-04 LAB — CBC WITH DIFFERENTIAL/PLATELET
Basophils Absolute: 0.1 10*3/uL (ref 0.0–0.2)
Basos: 1 %
EOS (ABSOLUTE): 0.2 10*3/uL (ref 0.0–0.4)
Eos: 2 %
Hematocrit: 42.7 % (ref 34.0–46.6)
Hemoglobin: 14.3 g/dL (ref 11.1–15.9)
Immature Grans (Abs): 0.1 10*3/uL (ref 0.0–0.1)
Immature Granulocytes: 1 %
Lymphocytes Absolute: 1.2 10*3/uL (ref 0.7–3.1)
Lymphs: 9 %
MCH: 31.9 pg (ref 26.6–33.0)
MCHC: 33.5 g/dL (ref 31.5–35.7)
MCV: 95 fL (ref 79–97)
Monocytes Absolute: 0.8 10*3/uL (ref 0.1–0.9)
Monocytes: 7 %
Neutrophils Absolute: 10.4 10*3/uL — ABNORMAL HIGH (ref 1.4–7.0)
Neutrophils: 80 %
Platelets: 373 10*3/uL (ref 150–450)
RBC: 4.48 x10E6/uL (ref 3.77–5.28)
RDW: 13.7 % (ref 11.7–15.4)
WBC: 12.8 10*3/uL — ABNORMAL HIGH (ref 3.4–10.8)

## 2023-02-04 LAB — COMPREHENSIVE METABOLIC PANEL
ALT: 29 IU/L (ref 0–32)
AST: 24 IU/L (ref 0–40)
Albumin: 4.8 g/dL (ref 3.8–4.9)
Alkaline Phosphatase: 90 IU/L (ref 44–121)
BUN/Creatinine Ratio: 10 (ref 9–23)
BUN: 9 mg/dL (ref 6–24)
Bilirubin Total: 0.3 mg/dL (ref 0.0–1.2)
CO2: 24 mmol/L (ref 20–29)
Calcium: 10.2 mg/dL (ref 8.7–10.2)
Chloride: 103 mmol/L (ref 96–106)
Creatinine, Ser: 0.93 mg/dL (ref 0.57–1.00)
Globulin, Total: 2.4 g/dL (ref 1.5–4.5)
Glucose: 97 mg/dL (ref 70–99)
Potassium: 5.4 mmol/L — ABNORMAL HIGH (ref 3.5–5.2)
Sodium: 143 mmol/L (ref 134–144)
Total Protein: 7.2 g/dL (ref 6.0–8.5)
eGFR: 72 mL/min/{1.73_m2} (ref >59)

## 2023-02-04 LAB — QUANTIFERON-TB GOLD PLUS
QuantiFERON Mitogen Value: 2.56 [IU]/mL
QuantiFERON Nil Value: 0 [IU]/mL
QuantiFERON TB1 Ag Value: 0.01 IU/mL
QuantiFERON TB2 Ag Value: 0.01 [IU]/mL
QuantiFERON-TB Gold Plus: NEGATIVE

## 2023-02-05 ENCOUNTER — Encounter (HOSPITAL_BASED_OUTPATIENT_CLINIC_OR_DEPARTMENT_OTHER): Payer: Self-pay | Admitting: Pulmonary Disease

## 2023-02-05 NOTE — Telephone Encounter (Signed)
Submitted a Prior Authorization request to [facility] for  Acthar SQ autoinjector  via CoverMyMeds. Will update once we receive a response.  Key: [id number]  Received paperwork from Dr. [name] for Acthar SQ. Submission pending PA determination  Chesley Mires, PharmD, MPH, BCPS, CPP Clinical Pharmacist (Rheumatology and Pulmonology)

## 2023-02-06 ENCOUNTER — Encounter (HOSPITAL_COMMUNITY): Payer: Self-pay

## 2023-02-06 ENCOUNTER — Telehealth (HOSPITAL_COMMUNITY): Payer: Self-pay

## 2023-02-06 ENCOUNTER — Encounter (HOSPITAL_COMMUNITY): Payer: BC Managed Care – PPO

## 2023-02-06 NOTE — Telephone Encounter (Signed)
Received a fax regarding Prior Authorization from [facility] for  ACTHAR . Authorization has been DENIED because tour plan only covers this drug when it is used for infantile spasms and multiple sclerosis.  Appeal information: Chief Technology Officer and Correspondence Department [address] [place], [place] [address] Fax: [phone]  Denial letter submitted with Acthar enrollment form today  Fax: [phone] Phone: [phone]  Chesley Mires, PharmD, MPH, BCPS, CPP Clinical Pharmacist (Rheumatology and Pulmonology)

## 2023-02-06 NOTE — Telephone Encounter (Signed)
Called [name] to discuss her attendance. I mentioned that I could start in the 8:15 class on 02/27/23. [name] agreed with what a said. Will change her class time.

## 2023-02-08 ENCOUNTER — Encounter (HOSPITAL_COMMUNITY): Payer: BC Managed Care – PPO

## 2023-02-09 ENCOUNTER — Ambulatory Visit (INDEPENDENT_AMBULATORY_CARE_PROVIDER_SITE_OTHER): Payer: BC Managed Care – PPO | Admitting: Psychology

## 2023-02-09 DIAGNOSIS — D869 Sarcoidosis, unspecified: Secondary | ICD-10-CM

## 2023-02-09 DIAGNOSIS — G3184 Mild cognitive impairment, so stated: Secondary | ICD-10-CM | POA: Diagnosis not present

## 2023-02-09 NOTE — Progress Notes (Signed)
   Neuropsychology Feedback Session [name]. [facility] [place] [place] Department of Neurology  Reason for Referral:   [name] is a 57 y.o. right-handed Caucasian female referred by Taylor Kays, PA-C, to characterize her current cognitive functioning and assist with diagnostic clarity and treatment planning in the context of subjective cognitive decline and a history of sarcoidosis.   Feedback:   [name] completed a comprehensive neuropsychological evaluation on 02/02/2023. Please refer to that encounter for the full report and recommendations. Briefly, results suggested a primary impairment surrounding complex attention/concentration. Further relative weaknesses were exhibited across verbal fluency and both encoding (i.e., learning) and retrieval aspects of memory. Performance variability was exhibited across processing speed and executive functioning. The etiology for [name]'s nonspecific cognitive profile is likely multifactorial in nature. While recent neuroimaging has not suggested any sarcoid involvement of the CNS, she did describe quite severe fatigue. Significant fatigue is common in sarcoidosis and could certainly influence [name]'s pattern of deficits, especially with regard to attention/concentration and learning aspects of memory. Additionally, she has several cardiovascular medical ailments which can impact cognitive functioning, as well as what appears to be poorly managed mild sleep apnea. At the present time, the combination of these factors (as well as various stressors and mild psychiatric concerns) represents the most likely culprit for testing performances and her day-to-day experiences. Side effects from prednisone may also play a mild role in her ongoing presentation. Across memory testing, [name] greater area of difficulty surrounds encoding, or the initial learning of new information. While delayed retrieval performances across memory tasks were  normatively low, this is likely influenced by the inefficiency at which she was able to learn new information initially. Importantly, retention rates ranged from 71% to 133% across memory testing. This suggests that she is able to retain the information she learns well, it would simply appear more challenging for her to learn it in the first place. This can certainly be seen with the factors described above.   [name] was accompanied by her husband during the current feedback session. Content of the current session focused on the results of her neuropsychological evaluation. [name] was given the opportunity to ask questions and her questions were answered. She was encouraged to reach out should additional questions arise. A copy of her report was provided at the conclusion of the visit.      One unit [phone] was billed for Dr. Tammy [name] time spent preparing for, conducting, and documenting the current feedback session with [name].

## 2023-02-13 ENCOUNTER — Encounter (HOSPITAL_COMMUNITY): Payer: BC Managed Care – PPO

## 2023-02-14 ENCOUNTER — Telehealth (HOSPITAL_BASED_OUTPATIENT_CLINIC_OR_DEPARTMENT_OTHER): Payer: Self-pay | Admitting: Pulmonary Disease

## 2023-02-14 NOTE — Telephone Encounter (Signed)
April calling for [facility]- [facility]har stating would like to move forward with the reimbursement process for the patient? They just need a letter of medical necessity completed. She has the form template if needed.

## 2023-02-14 NOTE — Telephone Encounter (Signed)
Spoke with April who will email template of letter of medical necessity. She said form can be completed and faxed directly back to them and they will submit to insurance on our behalf  Chesley Mires, PharmD, MPH, BCPS, CPP Clinical Pharmacist (Rheumatology and Pulmonology)\

## 2023-02-15 ENCOUNTER — Encounter (HOSPITAL_COMMUNITY): Payer: BC Managed Care – PPO

## 2023-02-15 ENCOUNTER — Encounter (HOSPITAL_BASED_OUTPATIENT_CLINIC_OR_DEPARTMENT_OTHER): Payer: Self-pay | Admitting: Pulmonary Disease

## 2023-02-15 NOTE — Telephone Encounter (Signed)
Printed and on desk for Dr [name]

## 2023-02-15 NOTE — Telephone Encounter (Signed)
Paperwork completed on 02/15/23 and faxed today.

## 2023-02-20 ENCOUNTER — Encounter (HOSPITAL_COMMUNITY): Payer: BC Managed Care – PPO

## 2023-02-21 NOTE — Addendum Note (Signed)
Encounter addended by: Guss Bunde, RRT on: 02/21/2023 10:53 AM  Actions taken: Clinical Note Signed

## 2023-02-21 NOTE — Progress Notes (Signed)
Pulmonary Individual Treatment Plan  Patient Details  Name: [name] MRN: [id number] Date of Birth: 1965-11-05 Referring Provider:   Doristine Devoid Pulmonary Rehab Walk Test from 01/12/2023 in [facility]  Referring Provider [name]       Initial Encounter Date:  Flowsheet Row Pulmonary Rehab Walk Test from 01/12/2023 in [facility]  Date 01/12/23       Visit Diagnosis: Sarcoidosis  Patient's Home Medications on Admission:   Current Outpatient Medications:    Budeson-Glycopyrrol-Formoterol (BREZTRI AEROSPHERE) 160-9-4.8 MCG/ACT AERO, Inhale 2 puffs into the lungs in the morning and at bedtime., Disp: 10.7 g, Rfl: 5   clobetasol ointment (TEMOVATE) 0.05 %, Apply 1 Application topically 2 (two) times daily. Use for 2 weeks at a time for a flare.  You may use the cream twice a week at bedtime for maintenance dosing., Disp: 60 g, Rfl: 1   folic acid (FOLVITE) 1 MG tablet, Take 2 tablets (2 mg total) by mouth daily., Disp: 180 tablet, Rfl: 11   metFORMIN (GLUCOPHAGE-XR) 500 MG 24 hr tablet, Take 2 tablets (1,000 mg total) by mouth daily before supper., Disp: 180 tablet, Rfl: 3   methotrexate (RHEUMATREX) 2.5 MG tablet, Take 8 tablets (20 mg total) by mouth once a week., Disp: 96 tablet, Rfl: 1   omeprazole (PRILOSEC) 20 MG capsule, Take 1 capsule (20 mg total) by mouth daily., Disp: 90 capsule, Rfl: 5   ondansetron (ZOFRAN) 4 MG tablet, Take 1 tablet (4 mg total) by mouth daily at 2 PM., Disp: 30 tablet, Rfl: 0   predniSONE (DELTASONE) 5 MG tablet, Take 2 tablets (10 mg total) by mouth daily with breakfast., Disp: 60 tablet, Rfl: 2   tirzepatide (MOUNJARO) 5 MG/0.5ML Pen, Inject 5 mg into the skin once a week., Disp: 6 mL, Rfl: 3   valACYclovir (VALTREX) 1000 MG tablet, Take 2 Tablets By Mouth Every 12 Hours for 24 hours as needed for an outbreak., Disp: 30 tablet, Rfl:  1   Vibegron 75 MG TABS, Take 1 tablet (75 mg total) by mouth daily., Disp: 90 tablet, Rfl: 3  Past Medical History: Past Medical History:  Diagnosis Date   Back pain    Constipation    Contact dermatitis 02/11/2021   Elevated LFTs 12/12/2018   Fatty liver    Fen-phen history 11/24/2018   Generalized anxiety disorder 09/25/2022   Hypercholesteremia    Hypertension    Intestinal bacterial overgrowth    Joint pain    Kidney stones    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   Lumbar pain 07/12/2021   Lymphadenopathy 06/13/2022   Migraines    Mild cognitive impairment 02/02/2023   Mixed hyperlipidemia 12/12/2018   Morbid obesity 11/24/2018   Myalgia, unspecified site 06/21/2021   Nausea in adult    Obstructive sleep apnea    mild, no CPAP   Other allergic rhinitis 04/04/2022   Pain in thoracic spine 07/18/2021   Sarcoidosis    Sleep disturbance 10/08/2019   Splenic lesion 05/20/2022   Subclinical hypothyroidism 09/2009   Type 2 diabetes mellitus without complication, without long-term current use of insulin 05/05/2022    Tobacco Use: Social History   Tobacco Use  Smoking Status Never   Passive exposure: Never  Smokeless Tobacco Never    Labs: Review Flowsheet  More data exists      Latest Ref Rng & Units 12/06/2018 12/15/2019 05/05/2022  06/05/2022 09/19/2022  Labs for ITP Cardiac and Pulmonary Rehab  Cholestrol 100 - 199 mg/dL 161  096  - - -  LDL (calc) 0 - 99 mg/dL 045  409     811  - - -  HDL-C >39 mg/dL 65  65     73  - - -  Trlycerides 0 - 149 mg/dL 914  782  - - -  Hemoglobin A1c 4.0 - 5.6 % 6.2  6.3  6.0  - 6.6   TCO2 22 - 32 mmol/L - - - 28  -    Details       This result is from an external source.   Multiple values from one day are sorted in reverse-chronological order         Capillary Blood Glucose: Lab Results  Component Value Date   GLUCAP 118 (H) 06/05/2022   GLUCAP 107 (H) 05/24/2022    POCT Glucose     Row Name 01/12/23 0949              POCT Blood Glucose   Pre-Exercise 122 mg/dL                Pulmonary Assessment Scores:  Pulmonary Assessment Scores     Row Name 01/12/23 0956         ADL UCSD   ADL Phase Entry     SOB Score total 19       CAT Score   CAT Score 11       mMRC Score   mMRC Score 2             UCSD: Self-administered rating of dyspnea associated with activities of daily living (ADLs) 6-point scale (0 = "not at all" to 5 = "maximal or unable to do because of breathlessness")  Scoring Scores range from 0 to 120.  Minimally important difference is 5 units  CAT: CAT can identify the health impairment of COPD patients and is better correlated with disease progression.  CAT has a scoring range of zero to 40. The CAT score is classified into four groups of low (less than 10), medium (10 - 20), high (21-30) and very high (31-40) based on the impact level of disease on health status. A CAT score over 10 suggests significant symptoms.  A worsening CAT score could be explained by an exacerbation, poor medication adherence, poor inhaler technique, or progression of COPD or comorbid conditions.  CAT MCID is 2 points  mMRC: mMRC (Modified Medical Research Council) Dyspnea Scale is used to assess the degree of baseline functional disability in patients of respiratory disease due to dyspnea. No minimal important difference is established. A decrease in score of 1 point or greater is considered a positive change.   Pulmonary Function Assessment:  Pulmonary Function Assessment - 01/12/23 0930       Breath   Bilateral Breath Sounds Decreased    Shortness of Breath Yes;Fear of Shortness of Breath;Limiting activity             Exercise Target Goals: Exercise Program Goal: Individual exercise prescription set using results from initial 6 [name] walk test and THRR while considering  patient's activity barriers and safety.   Exercise Prescription Goal: Initial exercise prescription builds to 30-45  minutes a day of aerobic activity, 2-3 days per week.  Home exercise guidelines will be given to patient during program as part of exercise prescription that the participant will acknowledge.  Activity Barriers & Risk Stratification:  Activity Barriers & Cardiac Risk Stratification - 01/12/23 0915       Activity Barriers & Cardiac Risk Stratification   Activity Barriers Balance Concerns;Deconditioning;Muscular Weakness;Shortness of Breath             6 Minute Walk:  6 Minute Walk     Row Name 01/12/23 0958         6 Minute Walk   Phase Initial     Distance 1090 feet     Walk Time 6 minutes     # of Rest Breaks 0     MPH 2.06     METS 2.82     RPE 10     Perceived Dyspnea  1     VO2 Peak 9.88     Symptoms No     Resting HR 89 bpm     Resting BP 122/88     Resting Oxygen Saturation  98 %     Exercise Oxygen Saturation  during 6 [name] walk 95 %     Max Ex. HR 108 bpm     Max Ex. BP 126/72     2 Minute Post BP 114/70       Interval HR   1 Minute HR 104     2 Minute HR 106     3 Minute HR 104     4 Minute HR 106     5 Minute HR 102     6 Minute HR 108     2 Minute Post HR 92     Interval Heart Rate? Yes       Interval Oxygen   Interval Oxygen? Yes     Baseline Oxygen Saturation % 98 %     1 Minute Oxygen Saturation % 95 %     1 Minute Liters of Oxygen 0 L     2 Minute Oxygen Saturation % 97 %     2 Minute Liters of Oxygen 0 L     3 Minute Oxygen Saturation % 98 %     3 Minute Liters of Oxygen 0 L     4 Minute Oxygen Saturation % 97 %     4 Minute Liters of Oxygen 0 L     5 Minute Oxygen Saturation % 99 %     5 Minute Liters of Oxygen 0 L     6 Minute Oxygen Saturation % 100 %     6 Minute Liters of Oxygen 0 L     2 Minute Post Oxygen Saturation % 100 %     2 Minute Post Liters of Oxygen 0 L              Oxygen Initial Assessment:  Oxygen Initial Assessment - 01/12/23 0926       Home Oxygen   Home Oxygen Device None    Sleep Oxygen Prescription  None    Home Exercise Oxygen Prescription None    Home Resting Oxygen Prescription None      Initial 6 [name] Walk   Oxygen Used None      Program Oxygen Prescription   Program Oxygen Prescription None      Intervention   Short Term Goals To learn and understand importance of maintaining oxygen saturations>88%;To learn and demonstrate proper use of respiratory medications;To learn and understand importance of monitoring SPO2 with pulse oximeter and demonstrate accurate use of the pulse oximeter.;To learn and demonstrate proper pursed lip breathing techniques or other breathing techniques.  Long  Term Goals Maintenance of O2 saturations>88%;Compliance with respiratory medication;Verbalizes importance of monitoring SPO2 with pulse oximeter and return demonstration;Exhibits proper breathing techniques, such as pursed lip breathing or other method taught during program session;Demonstrates proper use of MDI's             Oxygen Re-Evaluation:  Oxygen Re-Evaluation     Row Name 01/18/23 0919 02/12/23 1226           Program Oxygen Prescription   Program Oxygen Prescription None None        Home Oxygen   Home Oxygen Device None None      Sleep Oxygen Prescription None None      Home Exercise Oxygen Prescription None None      Home Resting Oxygen Prescription None None        Goals/Expected Outcomes   Short Term Goals To learn and understand importance of maintaining oxygen saturations>88%;To learn and demonstrate proper use of respiratory medications;To learn and understand importance of monitoring SPO2 with pulse oximeter and demonstrate accurate use of the pulse oximeter.;To learn and demonstrate proper pursed lip breathing techniques or other breathing techniques.  To learn and understand importance of maintaining oxygen saturations>88%;To learn and demonstrate proper use of respiratory medications;To learn and understand importance of monitoring SPO2 with pulse oximeter and  demonstrate accurate use of the pulse oximeter.;To learn and demonstrate proper pursed lip breathing techniques or other breathing techniques.       Long  Term Goals Maintenance of O2 saturations>88%;Compliance with respiratory medication;Verbalizes importance of monitoring SPO2 with pulse oximeter and return demonstration;Exhibits proper breathing techniques, such as pursed lip breathing or other method taught during program session;Demonstrates proper use of MDI's Maintenance of O2 saturations>88%;Compliance with respiratory medication;Verbalizes importance of monitoring SPO2 with pulse oximeter and return demonstration;Exhibits proper breathing techniques, such as pursed lip breathing or other method taught during program session;Demonstrates proper use of MDI's      Goals/Expected Outcomes Compliance and understanding of oxygen saturation monitoring and breathing techniques to decrease shortness of breath Compliance and understanding of oxygen saturation monitoring and breathing techniques to decrease shortness of breath               Oxygen Discharge (Final Oxygen Re-Evaluation):  Oxygen Re-Evaluation - 02/12/23 1226       Program Oxygen Prescription   Program Oxygen Prescription None      Home Oxygen   Home Oxygen Device None    Sleep Oxygen Prescription None    Home Exercise Oxygen Prescription None    Home Resting Oxygen Prescription None      Goals/Expected Outcomes   Short Term Goals To learn and understand importance of maintaining oxygen saturations>88%;To learn and demonstrate proper use of respiratory medications;To learn and understand importance of monitoring SPO2 with pulse oximeter and demonstrate accurate use of the pulse oximeter.;To learn and demonstrate proper pursed lip breathing techniques or other breathing techniques.     Long  Term Goals Maintenance of O2 saturations>88%;Compliance with respiratory medication;Verbalizes importance of monitoring SPO2 with pulse  oximeter and return demonstration;Exhibits proper breathing techniques, such as pursed lip breathing or other method taught during program session;Demonstrates proper use of MDI's    Goals/Expected Outcomes Compliance and understanding of oxygen saturation monitoring and breathing techniques to decrease shortness of breath             Initial Exercise Prescription:  Initial Exercise Prescription - 01/12/23 1000       Date of Initial Exercise RX and Referring Provider  Date 01/12/23    Referring Provider [name]    Expected Discharge Date 04/12/23      Treadmill   MPH 2    Grade 0    Minutes 15      Elliptical   Level 1    Speed 1    Minutes 15      Prescription Details   Frequency (times per week) 2    Duration Progress to 30 minutes of continuous aerobic without signs/symptoms of physical distress      Intensity   THRR 40-80% of Max Heartrate 66-131    Ratings of Perceived Exertion 11-13    Perceived Dyspnea 0-4      Progression   Progression Continue to progress workloads to maintain intensity without signs/symptoms of physical distress.      Resistance Training   Training Prescription Yes    Weight black bands    Reps 10-15             Perform Capillary Blood Glucose checks as needed.  Exercise Prescription Changes:   Exercise Prescription Changes     Row Name 01/30/23 1500             Response to Exercise   Blood Pressure (Admit) 130/80       Blood Pressure (Exercise) 142/80       Blood Pressure (Exit) 126/76       Heart Rate (Admit) 96 bpm       Heart Rate (Exercise) 118 bpm       Heart Rate (Exit) 104 bpm       Oxygen Saturation (Admit) 96 %       Oxygen Saturation (Exercise) 96 %       Oxygen Saturation (Exit) 98 %       Rating of Perceived Exertion (Exercise) 11       Perceived Dyspnea (Exercise) 1       Duration Continue with 30 [name] of aerobic exercise without signs/symptoms of physical distress.       Intensity THRR unchanged          Resistance Training   Training Prescription Yes       Weight black bands       Reps 10-15       Time 10 Minutes         Treadmill   MPH 2       Grade 0       Minutes 15       METs 2.53         Elliptical   Level 1       Speed 1       Minutes 15                Exercise Comments:   Exercise Comments     Row Name 01/30/23 1509           Exercise Comments [name] completed her first day of exercise. She exercised for 15 [name] on upright elliptical and treadmill. [name] averaged 4.3 METs at 1 speed and incline on the upright elliptical and 2.4 METs at 2.0 mph on the treadmill. [name] performed the warmup and cooldown standing without limitations. Discussed METs.                Exercise Goals and Review:   Exercise Goals     Row Name 01/12/23 1610 01/18/23 0919 01/19/23 1541 02/12/23 1217       Exercise Goals   Increase Physical Activity Yes  Yes Yes Yes    Intervention Provide advice, education, support and counseling about physical activity/exercise needs.;Develop an individualized exercise prescription for aerobic and resistive training based on initial evaluation findings, risk stratification, comorbidities and participant's personal goals. Provide advice, education, support and counseling about physical activity/exercise needs.;Develop an individualized exercise prescription for aerobic and resistive training based on initial evaluation findings, risk stratification, comorbidities and participant's personal goals. Provide advice, education, support and counseling about physical activity/exercise needs.;Develop an individualized exercise prescription for aerobic and resistive training based on initial evaluation findings, risk stratification, comorbidities and participant's personal goals. Provide advice, education, support and counseling about physical activity/exercise needs.;Develop an individualized exercise prescription for aerobic and resistive training based on  initial evaluation findings, risk stratification, comorbidities and participant's personal goals.    Expected Outcomes Short Term: Attend rehab on a regular basis to increase amount of physical activity.;Long Term: Exercising regularly at least 3-5 days a week.;Long Term: Add in home exercise to make exercise part of routine and to increase amount of physical activity. Short Term: Attend rehab on a regular basis to increase amount of physical activity.;Long Term: Exercising regularly at least 3-5 days a week.;Long Term: Add in home exercise to make exercise part of routine and to increase amount of physical activity. Short Term: Attend rehab on a regular basis to increase amount of physical activity.;Long Term: Exercising regularly at least 3-5 days a week.;Long Term: Add in home exercise to make exercise part of routine and to increase amount of physical activity. Short Term: Attend rehab on a regular basis to increase amount of physical activity.;Long Term: Exercising regularly at least 3-5 days a week.;Long Term: Add in home exercise to make exercise part of routine and to increase amount of physical activity.    Increase Strength and Stamina Yes Yes Yes Yes    Intervention Provide advice, education, support and counseling about physical activity/exercise needs.;Develop an individualized exercise prescription for aerobic and resistive training based on initial evaluation findings, risk stratification, comorbidities and participant's personal goals. Provide advice, education, support and counseling about physical activity/exercise needs.;Develop an individualized exercise prescription for aerobic and resistive training based on initial evaluation findings, risk stratification, comorbidities and participant's personal goals. Provide advice, education, support and counseling about physical activity/exercise needs.;Develop an individualized exercise prescription for aerobic and resistive training based on initial  evaluation findings, risk stratification, comorbidities and participant's personal goals. Provide advice, education, support and counseling about physical activity/exercise needs.;Develop an individualized exercise prescription for aerobic and resistive training based on initial evaluation findings, risk stratification, comorbidities and participant's personal goals.    Expected Outcomes Short Term: Increase workloads from initial exercise prescription for resistance, speed, and METs.;Short Term: Perform resistance training exercises routinely during rehab and add in resistance training at home;Long Term: Improve cardiorespiratory fitness, muscular endurance and strength as measured by increased METs and functional capacity ( ) Short Term: Increase workloads from initial exercise prescription for resistance, speed, and METs.;Short Term: Perform resistance training exercises routinely during rehab and add in resistance training at home;Long Term: Improve cardiorespiratory fitness, muscular endurance and strength as measured by increased METs and functional capacity ( ) Short Term: Increase workloads from initial exercise prescription for resistance, speed, and METs.;Short Term: Perform resistance training exercises routinely during rehab and add in resistance training at home;Long Term: Improve cardiorespiratory fitness, muscular endurance and strength as measured by increased METs and functional capacity ( ) Short Term: Increase workloads from initial exercise prescription for resistance, speed, and METs.;Short Term: Perform resistance training exercises routinely  during rehab and add in resistance training at home;Long Term: Improve cardiorespiratory fitness, muscular endurance and strength as measured by increased METs and functional capacity ( )    Able to understand and use rate of perceived exertion (RPE) scale Yes Yes Yes Yes    Intervention Provide education and explanation on how to use RPE  scale Provide education and explanation on how to use RPE scale Provide education and explanation on how to use RPE scale Provide education and explanation on how to use RPE scale    Expected Outcomes Short Term: Able to use RPE daily in rehab to express subjective intensity level;Long Term:  Able to use RPE to guide intensity level when exercising independently Short Term: Able to use RPE daily in rehab to express subjective intensity level;Long Term:  Able to use RPE to guide intensity level when exercising independently Short Term: Able to use RPE daily in rehab to express subjective intensity level;Long Term:  Able to use RPE to guide intensity level when exercising independently Short Term: Able to use RPE daily in rehab to express subjective intensity level;Long Term:  Able to use RPE to guide intensity level when exercising independently    Able to understand and use Dyspnea scale Yes Yes Yes Yes    Intervention Provide education and explanation on how to use Dyspnea scale Provide education and explanation on how to use Dyspnea scale Provide education and explanation on how to use Dyspnea scale Provide education and explanation on how to use Dyspnea scale    Expected Outcomes Short Term: Able to use Dyspnea scale daily in rehab to express subjective sense of shortness of breath during exertion;Long Term: Able to use Dyspnea scale to guide intensity level when exercising independently Short Term: Able to use Dyspnea scale daily in rehab to express subjective sense of shortness of breath during exertion;Long Term: Able to use Dyspnea scale to guide intensity level when exercising independently Short Term: Able to use Dyspnea scale daily in rehab to express subjective sense of shortness of breath during exertion;Long Term: Able to use Dyspnea scale to guide intensity level when exercising independently Short Term: Able to use Dyspnea scale daily in rehab to express subjective sense of shortness of breath  during exertion;Long Term: Able to use Dyspnea scale to guide intensity level when exercising independently    Knowledge and understanding of Target Heart Rate Range (THRR) Yes Yes Yes Yes    Intervention Provide education and explanation of THRR including how the numbers were predicted and where they are located for reference Provide education and explanation of THRR including how the numbers were predicted and where they are located for reference Provide education and explanation of THRR including how the numbers were predicted and where they are located for reference Provide education and explanation of THRR including how the numbers were predicted and where they are located for reference    Expected Outcomes Short Term: Able to state/look up THRR;Short Term: Able to use daily as guideline for intensity in rehab;Long Term: Able to use THRR to govern intensity when exercising independently Short Term: Able to state/look up THRR;Short Term: Able to use daily as guideline for intensity in rehab;Long Term: Able to use THRR to govern intensity when exercising independently Short Term: Able to state/look up THRR;Short Term: Able to use daily as guideline for intensity in rehab;Long Term: Able to use THRR to govern intensity when exercising independently Short Term: Able to state/look up THRR;Short Term: Able to use daily as guideline  for intensity in rehab;Long Term: Able to use THRR to govern intensity when exercising independently    Understanding of Exercise Prescription Yes Yes Yes Yes    Intervention Provide education, explanation, and written materials on patient's individual exercise prescription Provide education, explanation, and written materials on patient's individual exercise prescription Provide education, explanation, and written materials on patient's individual exercise prescription Provide education, explanation, and written materials on patient's individual exercise prescription    Expected  Outcomes Short Term: Able to explain program exercise prescription;Long Term: Able to explain home exercise prescription to exercise independently Short Term: Able to explain program exercise prescription;Long Term: Able to explain home exercise prescription to exercise independently Short Term: Able to explain program exercise prescription;Long Term: Able to explain home exercise prescription to exercise independently Short Term: Able to explain program exercise prescription;Long Term: Able to explain home exercise prescription to exercise independently             Exercise Goals Re-Evaluation :  Exercise Goals Re-Evaluation     Row Name 01/18/23 0919 02/12/23 1217           Exercise Goal Re-Evaluation   Exercise Goals Review Increase Physical Activity;Able to understand and use Dyspnea scale;Understanding of Exercise Prescription;Increase Strength and Stamina;Knowledge and understanding of Target Heart Rate Range (THRR);Able to understand and use rate of perceived exertion (RPE) scale Increase Physical Activity;Able to understand and use Dyspnea scale;Understanding of Exercise Prescription;Increase Strength and Stamina;Knowledge and understanding of Target Heart Rate Range (THRR);Able to understand and use rate of perceived exertion (RPE) scale      Comments Pt is scheduled to begin exercise 8/13. Will progress as tolerated. [name] has completed 1 exercise session. She exercises for 15 [name] on the upright elliptical and treadmill. [name] averages 4.3 METs at level 1 and speed 1 and 2.4 METs on the treadmill. She performs the warmup and cooldown standing without limitations. [name] has missed several sessions due to her surgery. She plans to return next month at a earlier class time that will benefit her. Will continue to monitor and progress as able.      Expected Outcomes Through exercise at rehab and home, the patient will decrease shortness of breath with daily activities and feel confident in  carrying out an exercise regimen at home Through exercise at rehab and home, the patient will decrease shortness of breath with daily activities and feel confident in carrying out an exercise regimen at home               Discharge Exercise Prescription (Final Exercise Prescription Changes):  Exercise Prescription Changes - 01/30/23 1500       Response to Exercise   Blood Pressure (Admit) 130/80    Blood Pressure (Exercise) 142/80    Blood Pressure (Exit) 126/76    Heart Rate (Admit) 96 bpm    Heart Rate (Exercise) 118 bpm    Heart Rate (Exit) 104 bpm    Oxygen Saturation (Admit) 96 %    Oxygen Saturation (Exercise) 96 %    Oxygen Saturation (Exit) 98 %    Rating of Perceived Exertion (Exercise) 11    Perceived Dyspnea (Exercise) 1    Duration Continue with 30 [name] of aerobic exercise without signs/symptoms of physical distress.    Intensity THRR unchanged      Resistance Training   Training Prescription Yes    Weight black bands    Reps 10-15    Time 10 Minutes      Treadmill   MPH  2    Grade 0    Minutes 15    METs 2.53      Elliptical   Level 1    Speed 1    Minutes 15             Nutrition:  Target Goals: Understanding of nutrition guidelines, daily intake of sodium 1500mg , cholesterol 200mg , calories 30% from fat and 7% or less from saturated fats, daily to have 5 or more servings of fruits and vegetables.  Biometrics:  Pre Biometrics - 01/12/23 0909       Pre Biometrics   Grip Strength 20 kg              Nutrition Therapy Plan and Nutrition Goals:  Nutrition Therapy & Goals - 01/30/23 1423       Nutrition Therapy   Diet General Healthy Diet      Personal Nutrition Goals   Nutrition Goal Patient to improve diet quality by using the plate method as a guide for meal planning to include lean protein/plant protein, fruits, vegetables, whole grains, nonfat dairy as part of a well-balanced diet.    Personal Goal #2 Patient to identify  strategies for weight loss of 0.5-2.0# per week.    Comments [name] is motivated to transition to more plant based eating to aid with inflammation and lose weight. She reports struggling with weight for most of adulthood. She did start Moujaro in April 2024 at 198#. She does report improved fasting/am blood sugars 100-120s. She does report eating out for lunch more often. [name] will benefit from participation in pulmonary rehab for nutrition, exercise, and lifestyle modification.      Intervention Plan   Intervention Prescribe, educate and counsel regarding individualized specific dietary modifications aiming towards targeted core components such as weight, hypertension, lipid management, diabetes, heart failure and other comorbidities.;Nutrition handout(s) given to patient.    Expected Outcomes Short Term Goal: Understand basic principles of dietary content, such as calories, fat, sodium, cholesterol and nutrients.;Long Term Goal: Adherence to prescribed nutrition plan.             Nutrition Assessments:  MEDIFICTS Score Key: ?70 Need to make dietary changes  40-70 Heart Healthy Diet ? 40 Therapeutic Level Cholesterol Diet   Picture Your Plate Scores: <84 Unhealthy dietary pattern with much room for improvement. 41-50 Dietary pattern unlikely to meet recommendations for good health and room for improvement. 51-60 More healthful dietary pattern, with some room for improvement.  >60 Healthy dietary pattern, although there may be some specific behaviors that could be improved.    Nutrition Goals Re-Evaluation:  Nutrition Goals Re-Evaluation     Row Name 01/30/23 1423             Goals   Current Weight 190 lb 7.6 oz (86.4 kg)       Comment A1c 6.6, history of elevated LFTS but WNL at this time       Expected Outcome [name] is motivated to transition to more plant based eating to aid with inflammation and lose weight. She reports struggling with weight for most of adulthood. She  did start Moujaro in April 2024 at 198#. She does report improved fasting/am blood sugars 100-120s. She does report eating out for lunch more often. [name] will benefit from participation in pulmonary rehab for nutrition, exercise, and lifestyle modification.                Nutrition Goals Discharge (Final Nutrition Goals Re-Evaluation):  Nutrition Goals Re-Evaluation -  01/30/23 1423       Goals   Current Weight 190 lb 7.6 oz (86.4 kg)    Comment A1c 6.6, history of elevated LFTS but WNL at this time    Expected Outcome Aniza is motivated to transition to more plant based eating to aid with inflammation and lose weight. She reports struggling with weight for most of adulthood. She did start Moujaro in April 2024 at 198#. She does report improved fasting/am blood sugars 100-120s. She does report eating out for lunch more often. [name] will benefit from participation in pulmonary rehab for nutrition, exercise, and lifestyle modification.             Psychosocial: Target Goals: Acknowledge presence or absence of significant depression and/or stress, maximize coping skills, provide positive support system. Participant is able to verbalize types and ability to use techniques and skills needed for reducing stress and depression.  Initial Review & Psychosocial Screening:  Initial Psych Review & Screening - 01/12/23 0922       Initial Review   Current issues with None Identified      Family Dynamics   Good Support System? Yes    Comments [name] states she has good support from her spouse      Barriers   Psychosocial barriers to participate in program There are no identifiable barriers or psychosocial needs.             Quality of Life Scores:  Scores of 19 and below usually indicate a poorer quality of life in these areas.  A difference of  2-3 points is a clinically meaningful difference.  A difference of 2-3 points in the total score of the Quality of Life Index has been  associated with significant improvement in overall quality of life, self-image, physical symptoms, and general health in studies assessing change in quality of life.  PHQ-9: Review Flowsheet       01/12/2023 12/15/2019 11/19/2018  Depression screen PHQ 2/9  Decreased Interest 0 3 0  Down, Depressed, Hopeless 0 2 0  PHQ - 2 Score 0 5 0  Altered sleeping 0 3 1  Tired, decreased energy 0 3 1  Change in appetite 0 3 0  Feeling bad or failure about yourself  0 0 0  Trouble concentrating 0 0 0  Moving slowly or fidgety/restless 0 1 0  Suicidal thoughts 0 0 0  PHQ-9 Score 0 15 2  Difficult doing work/chores - Not difficult at all -    Details           Interpretation of Total Score  Total Score Depression Severity:  1-4 = Minimal depression, 5-9 = Mild depression, 10-14 = Moderate depression, 15-19 = Moderately severe depression, 20-27 = Severe depression   Psychosocial Evaluation and Intervention:  Psychosocial Evaluation - 01/12/23 0926       Psychosocial Evaluation & Interventions   Comments [name] denies any psychosocial barriers or concerns    Expected Outcomes For [name] to participate in PR without any psychosocial barriers or concerns    Continue Psychosocial Services  No Follow up required             Psychosocial Re-Evaluation:  Psychosocial Re-Evaluation     Row Name 01/19/23 1457 02/12/23 1334           Psychosocial Re-Evaluation   Current issues with None Identified None Identified      Comments No changes since Kaelea's orientation appointment. [name] will start the program on 8/13/20242 [name] has only  attended one session. She is scheduled to return to class on 9/10 due to medical procedure. No new psychosocial barriers or concers.      Expected Outcomes For [name] to participate in RP without any psychosocial barriers or concerns For [name] to participate in RP without any psychosocial barriers or concerns      Interventions Encouraged to attend  Pulmonary Rehabilitation for the exercise Encouraged to attend Pulmonary Rehabilitation for the exercise      Continue Psychosocial Services  No Follow up required No Follow up required               Psychosocial Discharge (Final Psychosocial Re-Evaluation):  Psychosocial Re-Evaluation - 02/12/23 1334       Psychosocial Re-Evaluation   Current issues with None Identified    Comments [name] has only attended one session. She is scheduled to return to class on 9/10 due to medical procedure. No new psychosocial barriers or concers.    Expected Outcomes For Karlyn to participate in RP without any psychosocial barriers or concerns    Interventions Encouraged to attend Pulmonary Rehabilitation for the exercise    Continue Psychosocial Services  No Follow up required             Education: Education Goals: Education classes will be provided on a weekly basis, covering required topics. Participant will state understanding/return demonstration of topics presented.  Learning Barriers/Preferences:  Learning Barriers/Preferences - 01/12/23 1002       Learning Barriers/Preferences   Learning Barriers None    Learning Preferences Individual Instruction;Written Material;Verbal Instruction             Education Topics: Introduction to Pulmonary Rehab Group instruction provided by PowerPoint, verbal discussion, and written material to support subject matter. Instructor reviews what Pulmonary Rehab is, the purpose of the program, and how patients are referred.     Know Your Numbers Group instruction that is supported by a PowerPoint presentation. Instructor discusses importance of knowing and understanding resting, exercise, and post-exercise oxygen saturation, heart rate, and blood pressure. Oxygen saturation, heart rate, blood pressure, rating of perceived exertion, and dyspnea are reviewed along with a normal range for these values.    Exercise for the Pulmonary Patient Group  instruction that is supported by a PowerPoint presentation. Instructor discusses benefits of exercise, core components of exercise, frequency, duration, and intensity of an exercise routine, importance of utilizing pulse oximetry during exercise, safety while exercising, and options of places to exercise outside of rehab.       MET Level  Group instruction provided by PowerPoint, verbal discussion, and written material to support subject matter. Instructor reviews what METs are and how to increase METs.    Pulmonary Medications Verbally interactive group education provided by instructor with focus on inhaled medications and proper administration.   Anatomy and Physiology of the Respiratory System Group instruction provided by PowerPoint, verbal discussion, and written material to support subject matter. Instructor reviews respiratory cycle and anatomical components of the respiratory system and their functions. Instructor also reviews differences in obstructive and restrictive respiratory diseases with examples of each.    Oxygen Safety Group instruction provided by PowerPoint, verbal discussion, and written material to support subject matter. There is an overview of "What is Oxygen" and "Why do we need it".  Instructor also reviews how to create a safe environment for oxygen use, the importance of using oxygen as prescribed, and the risks of noncompliance. There is a brief discussion on traveling with oxygen and resources the  patient may utilize.   Oxygen Use Group instruction provided by PowerPoint, verbal discussion, and written material to discuss how supplemental oxygen is prescribed and different types of oxygen supply systems. Resources for more information are provided.    Breathing Techniques Group instruction that is supported by demonstration and informational handouts. Instructor discusses the benefits of pursed lip and diaphragmatic breathing and detailed demonstration on how  to perform both.     Risk Factor Reduction Group instruction that is supported by a PowerPoint presentation. Instructor discusses the definition of a risk factor, different risk factors for pulmonary disease, and how the heart and lungs work together.   MD Day A group question and answer session with a medical doctor that allows participants to ask questions that relate to their pulmonary disease state.   Nutrition for the Pulmonary Patient Group instruction provided by PowerPoint slides, verbal discussion, and written materials to support subject matter. The instructor gives an explanation and review of healthy diet recommendations, which includes a discussion on weight management, recommendations for fruit and vegetable consumption, as well as protein, fluid, caffeine, fiber, sodium, sugar, and alcohol. Tips for eating when patients are short of breath are discussed.    Other Education Group or individual verbal, written, or video instructions that support the educational goals of the pulmonary rehab program.    Knowledge Questionnaire Score:  Knowledge Questionnaire Score - 01/12/23 1002       Knowledge Questionnaire Score   Pre Score 17/18             Core Components/Risk Factors/Patient Goals at Admission:  Personal Goals and Risk Factors at Admission - 01/12/23 0919       Core Components/Risk Factors/Patient Goals on Admission    Weight Management Weight Loss;Yes    Intervention Weight Management: Develop a combined nutrition and exercise program designed to reach desired caloric intake, while maintaining appropriate intake of nutrient and fiber, sodium and fats, and appropriate energy expenditure required for the weight goal.;Weight Management: Provide education and appropriate resources to help participant work on and attain dietary goals.;Weight Management/Obesity: Establish reasonable short term and long term weight goals.;Obesity: Provide education and appropriate  resources to help participant work on and attain dietary goals.    Admit Weight 192 lb 9.6 oz (87.4 kg)    Improve shortness of breath with ADL's Yes    Intervention Provide education, individualized exercise plan and daily activity instruction to help decrease symptoms of SOB with activities of daily living.    Expected Outcomes Short Term: Improve cardiorespiratory fitness to achieve a reduction of symptoms when performing ADLs;Long Term: Be able to perform more ADLs without symptoms or delay the onset of symptoms    Increase knowledge of respiratory medications and ability to use respiratory devices properly  Yes    Intervention Provide education and demonstration as needed of appropriate use of medications, inhalers, and oxygen therapy.    Expected Outcomes Short Term: Achieves understanding of medications use. Understands that oxygen is a medication prescribed by physician. Demonstrates appropriate use of inhaler and oxygen therapy.;Long Term: Maintain appropriate use of medications, inhalers, and oxygen therapy.             Core Components/Risk Factors/Patient Goals Review:   Goals and Risk Factor Review     Row Name 01/22/23 1551 02/12/23 1335           Core Components/Risk Factors/Patient Goals Review   Personal Goals Review Improve shortness of breath with ADL's;Develop more efficient breathing techniques such  as purse lipped breathing and diaphragmatic breathing and practicing self-pacing with activity.;Increase knowledge of respiratory medications and ability to use respiratory devices properly.;Weight Management/Obesity Weight Management/Obesity;Improve shortness of breath with ADL's;Develop more efficient breathing techniques such as purse lipped breathing and diaphragmatic breathing and practicing self-pacing with activity.;Increase knowledge of respiratory medications and ability to use respiratory devices properly.      Review Unable to assess, [name] has not yet started the  program but is scheduled to start on 01/30/2023 [name] has attended one session so far. She is scheduled to return to class on 9/10. Goals are in progress.      Expected Outcomes See admission goals See admission goals               Core Components/Risk Factors/Patient Goals at Discharge (Final Review):   Goals and Risk Factor Review - 02/12/23 1335       Core Components/Risk Factors/Patient Goals Review   Personal Goals Review Weight Management/Obesity;Improve shortness of breath with ADL's;Develop more efficient breathing techniques such as purse lipped breathing and diaphragmatic breathing and practicing self-pacing with activity.;Increase knowledge of respiratory medications and ability to use respiratory devices properly.    Review [name] has attended one session so far. She is scheduled to return to class on 9/10. Goals are in progress.    Expected Outcomes See admission goals             ITP Comments:Pt is making expected progress toward Pulmonary Rehab goals after completing 1 sessions. Recommend continued exercise, life style modification, education, and utilization of breathing techniques to increase stamina and strength, while also decreasing shortness of breath with exertion.  Dr. Mechele Collin is Medical Director for Pulmonary Rehab at [facility].     Comments: Dr. Mechele Collin is Medical Director for Pulmonary Rehab at [facility].

## 2023-02-22 ENCOUNTER — Encounter (HOSPITAL_COMMUNITY): Payer: BC Managed Care – PPO

## 2023-02-27 ENCOUNTER — Encounter (HOSPITAL_COMMUNITY)
Admission: RE | Admit: 2023-02-27 | Discharge: 2023-02-27 | Disposition: A | Discharge status: Home / Self Care | Payer: BC Managed Care – PPO | Source: Ambulatory Visit | Attending: Pulmonary Disease | Admitting: Pulmonary Disease

## 2023-02-27 ENCOUNTER — Encounter (HOSPITAL_BASED_OUTPATIENT_CLINIC_OR_DEPARTMENT_OTHER): Payer: Self-pay | Admitting: Pulmonary Disease

## 2023-02-27 DIAGNOSIS — D869 Sarcoidosis, unspecified: Secondary | ICD-10-CM | POA: Diagnosis present

## 2023-02-27 LAB — GLUCOSE, CAPILLARY
Glucose-Capillary: 167 mg/dL — ABNORMAL HIGH (ref 70–99)
Glucose-Capillary: 118 mg/dL — ABNORMAL HIGH (ref 70–99)

## 2023-02-27 NOTE — Telephone Encounter (Signed)
April calling back and states they just need someone from the office to verify medication is correct and the dosage needed. Please advise.  Call back: [phone]

## 2023-02-27 NOTE — Progress Notes (Signed)
Daily Session Note  Patient Details  Name: [name] MRN: [id number] Date of Birth: 15-Apr-1966 Referring Provider:   Doristine Devoid Pulmonary Rehab Walk Test from 01/12/2023 in [facility]  Referring Provider [name]       Encounter Date: 02/27/2023  Check In:  Session Check In - 02/27/23 0826       Check-In   Supervising physician immediately available to respond to emergencies [facility] MD immediately available    Physician(s) Joni Reining, NP    Location [facility]    Staff Present Raford Pitcher, MS, ACSM-CEP, Exercise Physiologist;Randi Dionisio Paschal, ACSM-CEP, Exercise Physiologist;Mary Gerre Scull, RN, Fuller Plan, RT    Virtual Visit No    Medication changes reported     No    Fall or balance concerns reported    No    Tobacco Cessation No Change    Warm-up and Cool-down Performed as group-led instruction    Resistance Training Performed Yes    VAD Patient? No    PAD/SET Patient? No      Pain Assessment   Currently in Pain? No/denies    Multiple Pain Sites No             Capillary Blood Glucose: Results for orders placed or performed during the hospital encounter of 02/27/23 (from the past 24 hour(s))  Glucose, capillary     Status: Abnormal   Collection Time: 02/27/23  8:24 AM  Result Value Ref Range   Glucose-Capillary 167 (H) 70 - 99 mg/dL  Glucose, capillary     Status: Abnormal   Collection Time: 02/27/23  9:40 AM  Result Value Ref Range   Glucose-Capillary 118 (H) 70 - 99 mg/dL      Social History   Tobacco Use  Smoking Status Never   Passive exposure: Never  Smokeless Tobacco Never    Goals Met:  Proper associated with RPD/PD & O2 Sat Independence with exercise equipment Exercise tolerated well No report of concerns or symptoms today Strength training completed today  Goals Unmet:  Not Applicable  Comments: Service time is from 0821 to 0936.    Dr. Mechele Collin is Medical Director for Pulmonary Rehab at [facility].

## 2023-02-28 NOTE — Telephone Encounter (Signed)
Returned call to April  Senderra needs provider to provide clarification on script. Called pharmacy regarding rx - per rep they do not need rx clarification but a representation consent form signed. They will fax to [employer]. April will send appeals checklist which needs to be completed. She will email to me.  Michelene Heady phone: [phone] April call back: [phone]   Patient has received Acthar at home (4 doses). Scheduled for new start on 03/01/23  Chesley Mires, PharmD, MPH, BCPS, CPP Clinical Pharmacist (Rheumatology and Pulmonology)

## 2023-02-28 NOTE — Telephone Encounter (Deleted)
Returned call to April  [facility] needs provider to provide clarification on script. Called pharmacy regarding rx - per rep they do not need rx clarification but a representation consent form signed. They will fax to [employer]. April will send appeals checklist which needs to be completed. She will email to me.  Michelene Heady phone: [phone] April call back: [phone]   Patient has received Acthar at home (4 doses). Scheduled for new start on 03/01/23  Chesley Mires, PharmD, MPH, BCPS, CPP Clinical Pharmacist (Rheumatology and Pulmonology)

## 2023-03-01 ENCOUNTER — Encounter (HOSPITAL_COMMUNITY): Payer: BC Managed Care – PPO

## 2023-03-01 ENCOUNTER — Ambulatory Visit: Payer: BC Managed Care – PPO | Admitting: Pharmacist

## 2023-03-01 ENCOUNTER — Encounter (HOSPITAL_COMMUNITY)
Admission: RE | Admit: 2023-03-01 | Discharge: 2023-03-01 | Disposition: A | Discharge status: Home / Self Care | Payer: BC Managed Care – PPO | Source: Ambulatory Visit | Attending: Pulmonary Disease | Admitting: Pulmonary Disease

## 2023-03-01 DIAGNOSIS — D869 Sarcoidosis, unspecified: Secondary | ICD-10-CM

## 2023-03-01 DIAGNOSIS — Z79899 Other long term (current) drug therapy: Secondary | ICD-10-CM

## 2023-03-01 DIAGNOSIS — Z7189 Other specified counseling: Secondary | ICD-10-CM

## 2023-03-01 MED ORDER — ACTHAR GEL 40 UNIT/0.5ML ~~LOC~~ AUIJ
40.0000 mg | AUTO-INJECTOR | SUBCUTANEOUS | Status: DC
Start: 2023-03-01 — End: 2023-04-26

## 2023-03-01 NOTE — Patient Instructions (Addendum)
Your next ACTHAR dose is due on 03/04/23, 03/07/23, and every 3 days thereafter. Important points: - TAKE IN MORNING after leaving at room temperature for 45 minutes. - DO NOT DISCONTINUE without notifying clinic. We need to taper the medication off to avoid rebound side effects  CONTINUE methotrexate and prednisone as prescribed for now.  HOLD methotrexate with infections START over-the-counter melatonin 10mg  once nightly to help with sleep.  Avoid caffeine up to 12 hours before bedtime. Avoid blue light exposure (cell phone, TV, tablets) within 2 hours before sleep.   Your prescription will be shipped from [facility]. Their phone number is [phone] Please call to schedule shipment and confirm address. They will mail your medication to your home. We will continue to work with insurance for approval.  Follow-up with Dr. [name] in 4-6 weeks to discuss prednisone dose reduction re-challenge.  How to manage an injection site reaction: Remember the 5 C's: COUNTER - leave on the counter at least 30 minutes but up to overnight to bring medication to room temperature. This may help prevent stinging COLD - place something cold (like an ice gel pack or cold water bottle) on the injection site just before cleansing with alcohol. This may help reduce pain CLARITIN - use Claritin (generic name is loratadine) for the first two weeks of treatment or the day of, the day before, and the day after injecting. This will help to minimize injection site reactions CORTISONE CREAM - apply if injection site is irritated and itching CALL ME - if injection site reaction is bigger than the size of your fist, looks infected, blisters, or if you develop hives

## 2023-03-01 NOTE — Progress Notes (Signed)
Pharmacy Note  HPI Patient presents today to [facility] to see pharmacy team for Acthar new start. Patient diagnosed with sarcoidosis in April 2024 when inguinal node excision showed non-necrotizing granulomas. Past medical history includes T2DM, HTN, OSA.  She currently reports fatigue complicated by symptomatic sarcoid, sleep disturbances from prednisone use  Recent administration of live vaccines: No Allergies to pig-derived proteins (alpha-gal, red meat allergy, tick bit meat allergy): No History of stomach ulcer: No  Respiratory Medications Current regimen: methotrexate 20mg  p.o. once weekly with folic acid 2mg  daily + prednisone 10mg  daily Patient reports no known adherence challenges  OBJECTIVE Allergies  Allergen Reactions   Tizanidine Hcl Other (See Comments)    Cant move Cant move   Clarithromycin Nausea Only    Severe stomach cramps Severe stomach cramps    Outpatient Encounter Medications as of 03/01/2023  Medication Sig   Budeson-Glycopyrrol-Formoterol (BREZTRI AEROSPHERE) 160-9-4.8 MCG/ACT AERO Inhale 2 puffs into the lungs in the morning and at bedtime.   clobetasol ointment (TEMOVATE) 0.05 % Apply 1 Application topically 2 (two) times daily. Use for 2 weeks at a time for a flare.  You may use the cream twice a week at bedtime for maintenance dosing.   folic acid (FOLVITE) 1 MG tablet Take 2 tablets (2 mg total) by mouth daily.   metFORMIN (GLUCOPHAGE-XR) 500 MG 24 hr tablet Take 2 tablets (1,000 mg total) by mouth daily before supper.   methotrexate (RHEUMATREX) 2.5 MG tablet Take 8 tablets (20 mg total) by mouth once a week.   omeprazole (PRILOSEC) 20 MG capsule Take 1 capsule (20 mg total) by mouth daily.   ondansetron (ZOFRAN) 4 MG tablet Take 1 tablet (4 mg total) by mouth daily at 2 PM.   predniSONE (DELTASONE) 5 MG tablet Take 2 tablets (10 mg total) by mouth daily with breakfast.   tirzepatide (MOUNJARO) 5 MG/0.5ML Pen Inject 5 mg into the skin  once a week.   valACYclovir (VALTREX) 1000 MG tablet Take 2 Tablets By Mouth Every 12 Hours for 24 hours as needed for an outbreak.   Vibegron 75 MG TABS Take 1 tablet (75 mg total) by mouth daily.   No facility-administered encounter medications on file as of 03/01/2023.     Immunization History  Administered Date(s) Administered   PFIZER(Purple Top)SARS-COV-2 Vaccination 11/05/2019   Tdap 06/04/2007, 03/08/2018     PFTs    Latest Ref Rng & Units 11/17/2022    8:44 AM  PFT Results  FVC-Pre L 2.41   FVC-Predicted Pre % 77   FVC-Post L 2.44   FVC-Predicted Post % 78   Pre FEV1/FVC % % 86   Post FEV1/FCV % % 87   FEV1-Pre L 2.07   FEV1-Predicted Pre % 85   FEV1-Post L 2.11   DLCO uncorrected ml/min/mmHg 19.26   DLCO UNC% % 102   DLCO corrected ml/min/mmHg 19.26   DLCO COR %Predicted % 102   DLVA Predicted % 118   TLC L 4.15   TLC % Predicted % 88   RV % Predicted % 96      CT:  Assessment   Training for Acthar SQ  Goals of therapy: Mechanism: interacts with melanocortin receptors (MCRs) expressed on immune cells and other tissues which can lead to indirect anti-inflammatory effect and direct cell modulation. Exact mechanism is unknown. Reviewed that Acthar is a steroid-sparing agent studied in patients who have tried and failed first-line treatment options and have symptomatic sarcoidosis. Response to therapy: studies included  average of 32 weeks of treatment (>6 months)  Side effects: injection site reaction, infection (URTI,), elevated blood pressure, salt and water retention, low potassium, risk for adrenal insufficiency upon discontinuation, symptoms of Cushing's syndrome (elevated cortisol - moon face, increased upper body fat, bruising easily) Avoid suddenly discontinuing. Generally tapering is needed to minimize risk for adrenal insufficiency. Risk for bleeding and stomach ulcers. Notify if any stomach pains, blood vomit, bloody or black stools Mood changes  including irritability and depression. Reversible with treatment discontinuation. Sleeplessness Eye conditions: cataracts, glaucoma, optic nerve damage Osteoporosis  Most common side effects - injection site reaction - fatigue, physical weakness, lack of energy - fluid retention - insomnia - headache - high blood glucose  Dose: 40mg  subcutaneously every 3 days (72 hours). Can inrease to 80mg  if needed  Administration/Storage:  Reviewed administration sites of thigh or abdomen (at least 2-3 inches away from abdomen). Reviewed the upper arm is only appropriate if caregiver is administering injection  Recommended she administer in morning due to risk for insomnia Reviewed storage of medication in refrigerator. Reviewed that Acthar can be stored at room temperature in unopened carton for up to 24 hours. Avoid placing medication in direct sunlight.  Access: Patient enrolled into starter program currently and authorization through insurance is still in process (working alongside [employer])  Patient self-administered Acthar 40 USP units/0.47mL autoinjector in right upper thigh using patient-supplied medication NDC: [id number] Lot: [id number] Expiration: 04/2024  Patient monitored for 30 minutes for adverse reaction.  Patient tolerated with slight discomfort but tolerable.  Injection site noted. Patient denies irritation at injection site. Slight itchiness but not concerning for patient, No swelling or redness noted., and Reviewed injection site reaction management with patient verbally and printed information for review in AVS  Medication Reconciliation  A drug regimen assessment was performed, including review of allergies, interactions, disease-state management, dosing and immunization history. Medications were reviewed with the patient, including name, instructions, indication, goals of therapy, potential side effects, importance of adherence, and safe use.  Drug  interaction(s): none noted  Immunizations Patient states that she does not received vaccinations. Recommended she avoid live vaccines (but that most routine vaccines are NOT live)  PLAN Continue Acthar 40mg  every 3 days.  Next dose is due 03/04/23 and every 72 hours thereafter. Rx sent to:  [facility] [phone] .  Patient provided with pharmacy phone number and advised to call to schedule folow-up shipments to home.  Continue methotrexate 20mg  p.o. once weekly with folic acid 2mg  daily. Reviewed holding MTX w active infections. Continue prednisone 10mg  daily Sleep disturbances: Recommend melatonin OTC 10mg  p.o once nightly. Recommend avoiding blue light exposure within 2 hours of targeted bedtime. Avoid caffeine up to 12 hours before bedtime. Hopefully can taper prednisone after Acthar is initiated Follow-up with Dr. [name] in 4-6 weeks to determine tapering plan  All questions encouraged and answered.  Instructed patient to reach out with any further questions or concerns.  Thank you for allowing pharmacy to participate in this patient's care.  This appointment required 45 minutes of patient care (this includes precharting, chart review, review of results, face-to-face care, etc.).   Chesley Mires, PharmD, MPH, BCPS, CPP Clinical Pharmacist (Rheumatology and Pulmonology)  CC Dr. [name]

## 2023-03-01 NOTE — Progress Notes (Signed)
Daily Session Note  Patient Details  Name: [name] MRN: [id number] Date of Birth: [birth year] Referring Provider:   Doristine Devoid Pulmonary Rehab Walk Test from 01/12/2023 in [facility]  Referring Provider [name]       Encounter Date: 03/01/2023  Check In:  Session Check In - 03/01/23 0853       Check-In   Supervising physician immediately available to respond to emergencies [facility] MD immediately available    Physician(s) Edd Fabian, NP    Location [facility]    Staff Present Raford Pitcher, MS, ACSM-CEP, Exercise Physiologist;Randi Dionisio Paschal, ACSM-CEP, Exercise Physiologist;Mary Gerre Scull, RN, Fuller Plan, RT    Virtual Visit No    Medication changes reported     No    Fall or balance concerns reported    No    Tobacco Cessation No Change    Warm-up and Cool-down Performed as group-led instruction    Resistance Training Performed Yes    VAD Patient? No    PAD/SET Patient? No      Pain Assessment   Currently in Pain? No/denies    Multiple Pain Sites No             Capillary Blood Glucose: No results found for this or any previous visit (from the past 24 hour(s)).    Social History   Tobacco Use  Smoking Status Never   Passive exposure: Never  Smokeless Tobacco Never    Goals Met:  Proper associated with RPD/PD & O2 Sat Independence with exercise equipment Exercise tolerated well No report of concerns or symptoms today Strength training completed today  Goals Unmet:  Not Applicable  Comments: Service time is from 0814 to 0936.    Dr. Mechele Collin is Medical Director for Pulmonary Rehab at [facility]

## 2023-03-01 NOTE — Telephone Encounter (Signed)
Patient's questions have been addressed at Acthar new start visit on 02/28/23. Patient states that Dr. [name] does not need to f/u on these questions unless she feels it is necessary  Chesley Mires, PharmD, MPH, BCPS, CPP Clinical Pharmacist (Rheumatology and Pulmonology)

## 2023-03-05 ENCOUNTER — Ambulatory Visit: Payer: BC Managed Care – PPO | Admitting: Obstetrics and Gynecology

## 2023-03-05 ENCOUNTER — Encounter (HOSPITAL_BASED_OUTPATIENT_CLINIC_OR_DEPARTMENT_OTHER): Payer: Self-pay | Admitting: Pulmonary Disease

## 2023-03-05 NOTE — Progress Notes (Unsigned)
GYNECOLOGY  VISIT   HPI: 57 y.o.   Married  Caucasian  female   G2P2002 with No LMP recorded. Patient has had an ablation.   here for   bleeding at biopsy site. After IC, pt noticed bleeding from Friday-Sunday. Pt notices some soreness; pt does want to discuss some slight urinary tingling.  No blood in her urine.  Voiding slower.    Perineal biopsy 01/30/23 showed acute and chronic inflammation with ulceration.  Negative for yeast.   She has vaginal estrogen cream from Dr. Florian Buff.   She had some mesh erosion post op, treated with the vaginal estrogen.   Using [place].   GYNECOLOGIC HISTORY: No LMP recorded. Patient has had an ablation. Contraception:  ablation/vasectomy Menopausal hormone therapy:  n/a Last mammogram:  12/11/22 Breast Density Cat A, BI-RADS CAT 1  neg Last pap smear:   11/28/17-WNL, HPV- neg, 11/26/14-WNL, 06/04/08- WNL         OB History     Gravida  2   Para  2   Term  2   Preterm      AB      Living  2      SAB      IAB      Ectopic      Multiple      Live Births  2              Patient Active Problem List   Diagnosis Date Noted   Mild cognitive impairment 02/02/2023   Migraines    Hypercholesteremia    Generalized anxiety disorder 09/25/2022   Hypertension 09/25/2022   High risk medication use 09/25/2022   Sarcoidosis    Lymphadenopathy 06/13/2022   Splenic lesion 05/20/2022   Type 2 diabetes mellitus without complication, without long-term current use of insulin 05/05/2022   Other allergic rhinitis 04/04/2022   Pain in thoracic spine 07/18/2021   Lumbar pain 07/12/2021   Myalgia, unspecified site 06/21/2021   Contact dermatitis 02/11/2021   Sleep disturbance 10/08/2019   Elevated LFTs 12/12/2018   Mixed hyperlipidemia 12/12/2018   Fen-phen history 11/24/2018   Obstructive sleep apnea 11/24/2018   Morbid obesity 11/24/2018    Past Medical History:  Diagnosis Date   Back pain    Constipation    Contact dermatitis  02/11/2021   Elevated LFTs 12/12/2018   Fatty liver    Fen-phen history 11/24/2018   Generalized anxiety disorder 09/25/2022   Hypercholesteremia    Hypertension    Intestinal bacterial overgrowth    Joint pain    Kidney stones    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   Lumbar pain 07/12/2021   Lymphadenopathy 06/13/2022   Migraines    Mild cognitive impairment 02/02/2023   Mixed hyperlipidemia 12/12/2018   Morbid obesity 11/24/2018   Myalgia, unspecified site 06/21/2021   Nausea in adult    Obstructive sleep apnea    mild, no CPAP   Other allergic rhinitis 04/04/2022   Pain in thoracic spine 07/18/2021   Sarcoidosis    Sleep disturbance 10/08/2019   Splenic lesion 05/20/2022   Subclinical hypothyroidism 09/2009   Type 2 diabetes mellitus without complication, without long-term current use of insulin 05/05/2022    Past Surgical History:  Procedure Laterality Date   BLADDER SUSPENSION N/A 06/05/2022   Procedure: TRANSVAGINAL TAPE (TVT) PROCEDURE;  Surgeon: Marguerita Beards, MD;  Location: [facility] ;  Service: Gynecology;  Laterality: N/A;  total time requested is 1 hour   COLONOSCOPY  CYSTOSCOPY N/A 06/05/2022   Procedure: CYSTOSCOPY;  Surgeon: Marguerita Beards, MD;  Location: [facility];  Service: Gynecology;  Laterality: N/A;   LITHOTRIPSY     LYMPH NODE DISSECTION     NOVASURE ABLATION  12/18/2006   RADIOACTIVE SEED GUIDED EXCISIONAL BREAST BIOPSY Right 05/30/2021   Procedure: RADIOACTIVE SEED GUIDED EXCISIONAL RIGHT BREAST BIOPSY;  Surgeon: Emelia Loron, MD;  Location: [facility];  Service: General;  Laterality: Right;   ROTATOR CUFF REPAIR Right 06/20/2007    Current Outpatient Medications  Medication Sig Dispense Refill   Budeson-Glycopyrrol-Formoterol (BREZTRI AEROSPHERE) 160-9-4.8 MCG/ACT AERO Inhale 2 puffs into the lungs in the morning and at bedtime. 10.7 g 5   clobetasol ointment (TEMOVATE) 0.05 %  Apply 1 Application topically 2 (two) times daily. Use for 2 weeks at a time for a flare.  You may use the cream twice a week at bedtime for maintenance dosing. 60 g 1   Corticotropin Gel (ACTHAR GEL) 40 UNIT/0.5ML AUIJ Inject 40 mg into the skin every 3 (three) days.     folic acid (FOLVITE) 1 MG tablet Take 2 tablets (2 mg total) by mouth daily. 180 tablet 11   metFORMIN (GLUCOPHAGE-XR) 500 MG 24 hr tablet Take 2 tablets (1,000 mg total) by mouth daily before supper. 180 tablet 3   methotrexate (RHEUMATREX) 2.5 MG tablet Take 8 tablets (20 mg total) by mouth once a week. 96 tablet 1   omeprazole (PRILOSEC) 20 MG capsule Take 1 capsule (20 mg total) by mouth daily. 90 capsule 5   ondansetron (ZOFRAN) 4 MG tablet Take 1 tablet (4 mg total) by mouth daily at 2 PM. 30 tablet 0   predniSONE (DELTASONE) 5 MG tablet Take 2 tablets (10 mg total) by mouth daily with breakfast. 60 tablet 2   tirzepatide (MOUNJARO) 5 MG/0.5ML Pen Inject 5 mg into the skin once a week. 6 mL 3   valACYclovir (VALTREX) 1000 MG tablet Take 2 Tablets By Mouth Every 12 Hours for 24 hours as needed for an outbreak. 30 tablet 1   Vibegron 75 MG TABS Take 1 tablet (75 mg total) by mouth daily. 90 tablet 3   No current facility-administered medications for this visit.     ALLERGIES: Tizanidine hcl and Clarithromycin  Family History  Problem Relation Age of Onset   Diabetes Mother    Hyperlipidemia Mother    Thyroid disease Mother    Anxiety disorder Mother    Obesity Mother    Aortic stenosis Mother    Arthritis-Osteo Sister    Colon cancer Paternal Grandmother    Heart disease Paternal Grandfather    Pancreatic cancer Neg Hx    Stomach cancer Neg Hx    Liver disease Neg Hx    Esophageal cancer Neg Hx     Social History   Socioeconomic History   Marital status: Married    Spouse name: Alinda Money   Number of children: 2   Years of education: 16   Highest education level: Bachelor's degree (e.g., BA, AB, BS)   Occupational History   Occupation: IT trainer  Tobacco Use   Smoking status: Never    Passive exposure: Never   Smokeless tobacco: Never  Vaping Use   Vaping status: Never Used  Substance and Sexual Activity   Alcohol use: No    Alcohol/week: 0.0 standard drinks of alcohol   Drug use: No   Sexual activity: Yes    Partners: Male    Birth control/protection: Surgical  Comment: Ablation, Vasectomy  Other Topics Concern   Not on file  Social History Narrative   Right handed   Drinks coffee   Lives with husband   Two story home   unemployed   Social Determinants of Health   Financial Resource Strain: Not on file  Food Insecurity: Not on file  Transportation Needs: Not on file  Physical Activity: Not on file  Stress: Not on file  Social Connections: Unknown (11/01/2021)   Received from [facility]   Social Network    Social Network: Not on file  Intimate Partner Violence: Unknown (09/23/2021)   Received from [facility]   HITS    Physically Hurt: Not on file    Insult or Talk Down To: Not on file    Threaten Physical Harm: Not on file    Scream or Curse: Not on file    Review of Systems  All other systems reviewed and are negative.   PHYSICAL EXAMINATION:    BP 124/84 (BP Location: Right Arm, Patient Position: Sitting, Cuff Size: Normal)   Pulse 64   Ht 5\' 1"  (1.549 m)   Wt 191 lb (86.6 kg)   SpO2 98%   BMI 36.09 kg/m     General appearance: alert, cooperative and appears stated age   Pelvic: External genitalia:  separation of perineal skin at biopsy site.              Urethra:  normal appearing urethra with no masses, tenderness or lesions.  Sling protected.                Bartholins and Skenes: normal                 Vagina: normal appearing vagina with normal color and discharge, no lesions              Cervix: no lesions                Bimanual Exam:  Uterus:  normal size, contour, position, consistency, mobility,  non-tender              Adnexa: no mass, fullness, tenderness    Chaperone was present for exam:  Warren Lacy, CMA  ASSESSMENT  Dysuria.  Status post midurethral sling.  Overactive bladder.  On Gemtesa.  Perineal pain.  Separation of wound from office biopsy.   PLAN  Urinalysis:  sg 1.002, ph 6.0, 6 - 10 WBC, NS RBC, 6 0- 10 epis, few bacteria.  UC sent.  Start Keflex 500 mg po bid x 7 days.  Use vaginal estrogen cream to perineum and ok to use 1/2 gram vaginally two to three times weekly.  Recheck in 2 weeks.   23 min  total time was spent for this patient encounter, including preparation, face-to-face counseling with the patient, coordination of care, and documentation of the encounter.

## 2023-03-05 NOTE — Progress Notes (Deleted)
GYNECOLOGY  VISIT   HPI: 57 y.o.   Married  Caucasian  female   G2P2002 with No LMP recorded. Patient has had an ablation.   here for   bleeding at vulvar bx site  GYNECOLOGIC HISTORY: No LMP recorded. Patient has had an ablation. Contraception:  ablation/vasectomy Menopausal hormone therapy:  n/a Last mammogram:  05/09/22 Breast Density Cat A, BI-RADS CAT 1 neg  Last pap smear:    11/28/17-WNL, HPV- neg, 11/26/14-WNL, 06/04/08- WNL         OB History     Gravida  2   Para  2   Term  2   Preterm      AB      Living  2      SAB      IAB      Ectopic      Multiple      Live Births  2              Patient Active Problem List   Diagnosis Date Noted   Mild cognitive impairment 02/02/2023   Migraines    Hypercholesteremia    Generalized anxiety disorder 09/25/2022   Hypertension 09/25/2022   High risk medication use 09/25/2022   Sarcoidosis    Lymphadenopathy 06/13/2022   Splenic lesion 05/20/2022   Type 2 diabetes mellitus without complication, without long-term current use of insulin 05/05/2022   Other allergic rhinitis 04/04/2022   Pain in thoracic spine 07/18/2021   Lumbar pain 07/12/2021   Myalgia, unspecified site 06/21/2021   Contact dermatitis 02/11/2021   Sleep disturbance 10/08/2019   Elevated LFTs 12/12/2018   Mixed hyperlipidemia 12/12/2018   Fen-phen history 11/24/2018   Obstructive sleep apnea 11/24/2018   Morbid obesity 11/24/2018    Past Medical History:  Diagnosis Date   Back pain    Constipation    Contact dermatitis 02/11/2021   Elevated LFTs 12/12/2018   Fatty liver    Fen-phen history 11/24/2018   Generalized anxiety disorder 09/25/2022   Hypercholesteremia    Hypertension    Intestinal bacterial overgrowth    Joint pain    Kidney stones    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   Lumbar pain 07/12/2021   Lymphadenopathy 06/13/2022   Migraines    Mild cognitive impairment 02/02/2023   Mixed hyperlipidemia 12/12/2018   Morbid  obesity 11/24/2018   Myalgia, unspecified site 06/21/2021   Nausea in adult    Obstructive sleep apnea    mild, no CPAP   Other allergic rhinitis 04/04/2022   Pain in thoracic spine 07/18/2021   Sarcoidosis    Sleep disturbance 10/08/2019   Splenic lesion 05/20/2022   Subclinical hypothyroidism 09/2009   Type 2 diabetes mellitus without complication, without long-term current use of insulin 05/05/2022    Past Surgical History:  Procedure Laterality Date   BLADDER SUSPENSION N/A 06/05/2022   Procedure: TRANSVAGINAL TAPE (TVT) PROCEDURE;  Surgeon: Marguerita Beards, MD;  Location: [facility];  Service: Gynecology;  Laterality: N/A;  total time requested is 1 hour   COLONOSCOPY     CYSTOSCOPY N/A 06/05/2022   Procedure: CYSTOSCOPY;  Surgeon: Marguerita Beards, MD;  Location: [facility];  Service: Gynecology;  Laterality: N/A;   LITHOTRIPSY     LYMPH NODE DISSECTION     NOVASURE ABLATION  12/18/2006   RADIOACTIVE SEED GUIDED EXCISIONAL BREAST BIOPSY Right 05/30/2021   Procedure: RADIOACTIVE SEED GUIDED EXCISIONAL RIGHT BREAST BIOPSY;  Surgeon: Emelia Loron, MD;  Location: [facility];  Service: General;  Laterality: Right;   ROTATOR CUFF REPAIR Right 06/20/2007    Current Outpatient Medications  Medication Sig Dispense Refill   Budeson-Glycopyrrol-Formoterol (BREZTRI AEROSPHERE) 160-9-4.8 MCG/ACT AERO Inhale 2 puffs into the lungs in the morning and at bedtime. 10.7 g 5   clobetasol ointment (TEMOVATE) 0.05 % Apply 1 Application topically 2 (two) times daily. Use for 2 weeks at a time for a flare.  You may use the cream twice a week at bedtime for maintenance dosing. 60 g 1   Corticotropin Gel (ACTHAR GEL) 40 UNIT/0.5ML AUIJ Inject 40 mg into the skin every 3 (three) days.     folic acid (FOLVITE) 1 MG tablet Take 2 tablets (2 mg total) by mouth daily. 180 tablet 11   metFORMIN (GLUCOPHAGE-XR) 500 MG 24 hr tablet Take 2  tablets (1,000 mg total) by mouth daily before supper. 180 tablet 3   methotrexate (RHEUMATREX) 2.5 MG tablet Take 8 tablets (20 mg total) by mouth once a week. 96 tablet 1   omeprazole (PRILOSEC) 20 MG capsule Take 1 capsule (20 mg total) by mouth daily. 90 capsule 5   ondansetron (ZOFRAN) 4 MG tablet Take 1 tablet (4 mg total) by mouth daily at 2 PM. 30 tablet 0   predniSONE (DELTASONE) 5 MG tablet Take 2 tablets (10 mg total) by mouth daily with breakfast. 60 tablet 2   tirzepatide (MOUNJARO) 5 MG/0.5ML Pen Inject 5 mg into the skin once a week. 6 mL 3   valACYclovir (VALTREX) 1000 MG tablet Take 2 Tablets By Mouth Every 12 Hours for 24 hours as needed for an outbreak. 30 tablet 1   Vibegron 75 MG TABS Take 1 tablet (75 mg total) by mouth daily. 90 tablet 3   No current facility-administered medications for this visit.     ALLERGIES: Tizanidine hcl and Clarithromycin  Family History  Problem Relation Age of Onset   Diabetes Mother    Hyperlipidemia Mother    Thyroid disease Mother    Anxiety disorder Mother    Obesity Mother    Aortic stenosis Mother    Arthritis-Osteo Sister    Colon cancer Paternal Grandmother    Heart disease Paternal Grandfather    Pancreatic cancer Neg Hx    Stomach cancer Neg Hx    Liver disease Neg Hx    Esophageal cancer Neg Hx     Social History   Socioeconomic History   Marital status: Married    Spouse name: Alinda Money   Number of children: 2   Years of education: 16   Highest education level: Bachelor's degree (e.g., BA, AB, BS)  Occupational History   Occupation: IT trainer  Tobacco Use   Smoking status: Never    Passive exposure: Never   Smokeless tobacco: Never  Vaping Use   Vaping status: Never Used  Substance and Sexual Activity   Alcohol use: No    Alcohol/week: 0.0 standard drinks of alcohol   Drug use: No   Sexual activity: Yes    Partners: Male    Birth control/protection: Surgical    Comment: Ablation, Vasectomy  Other Topics Concern    Not on file  Social History Narrative   Right handed   Drinks coffee   Lives with husband   Two story home   unemployed   Social Determinants of Health   Financial Resource Strain: Not on file  Food Insecurity: Not on file  Transportation Needs: Not on file  Physical Activity:  Not on file  Stress: Not on file  Social Connections: Unknown (11/01/2021)   Received from [facility]   Social Network    Social Network: Not on file  Intimate Partner Violence: Unknown (09/23/2021)   Received from [facility]   HITS    Physically Hurt: Not on file    Insult or Talk Down To: Not on file    Threaten Physical Harm: Not on file    Scream or Curse: Not on file    Review of Systems  PHYSICAL EXAMINATION:    There were no vitals taken for this visit.    General appearance: alert, cooperative and appears stated age Head: Normocephalic, without obvious abnormality, atraumatic Neck: no adenopathy, supple, symmetrical, trachea midline and thyroid normal to inspection and palpation Lungs: clear to auscultation bilaterally Breasts: normal appearance, no masses or tenderness, No nipple retraction or dimpling, No nipple discharge or bleeding, No axillary or supraclavicular adenopathy Heart: regular rate and rhythm Abdomen: soft, non-tender, no masses,  no organomegaly Extremities: extremities normal, atraumatic, no cyanosis or edema Skin: Skin color, texture, turgor normal. No rashes or lesions Lymph nodes: Cervical, supraclavicular, and axillary nodes normal. No abnormal inguinal nodes palpated Neurologic: Grossly normal  Pelvic: External genitalia:  no lesions              Urethra:  normal appearing urethra with no masses, tenderness or lesions              Bartholins and Skenes: normal                 Vagina: normal appearing vagina with normal color and discharge, no lesions              Cervix: no lesions                Bimanual Exam:  Uterus:   normal size, contour, position, consistency, mobility, non-tender              Adnexa: no mass, fullness, tenderness              Rectal exam: {yes no:314532}.  Confirms.              Anus:  normal sphincter tone, no lesions  Chaperone was present for exam:  ***  ASSESSMENT     PLAN     An After Visit Summary was printed and given to the patient.  ______ minutes face to face time of which over 50% was spent in counseling.

## 2023-03-06 ENCOUNTER — Encounter: Payer: Self-pay | Admitting: Obstetrics and Gynecology

## 2023-03-06 ENCOUNTER — Encounter (HOSPITAL_COMMUNITY): Payer: BC Managed Care – PPO

## 2023-03-06 ENCOUNTER — Ambulatory Visit: Payer: BC Managed Care – PPO | Admitting: Obstetrics and Gynecology

## 2023-03-06 ENCOUNTER — Encounter (HOSPITAL_COMMUNITY)
Admission: RE | Admit: 2023-03-06 | Discharge: 2023-03-06 | Disposition: A | Discharge status: Home / Self Care | Payer: BC Managed Care – PPO | Source: Ambulatory Visit | Attending: Pulmonary Disease

## 2023-03-06 VITALS — BP 124/84 | HR 64 | Ht 61.0 in | Wt 191.0 lb

## 2023-03-06 DIAGNOSIS — R300 Dysuria: Secondary | ICD-10-CM

## 2023-03-06 DIAGNOSIS — R102 Pelvic and perineal pain: Secondary | ICD-10-CM | POA: Diagnosis not present

## 2023-03-06 DIAGNOSIS — T8130XA Disruption of wound, unspecified, initial encounter: Secondary | ICD-10-CM

## 2023-03-06 DIAGNOSIS — D869 Sarcoidosis, unspecified: Secondary | ICD-10-CM

## 2023-03-06 MED ORDER — CEPHALEXIN 500 MG PO CAPS: 500.0000 mg | ORAL_CAPSULE | Freq: Two times a day (BID) | ORAL | 0 refills | Status: DC

## 2023-03-06 MED ORDER — FLUCONAZOLE 150 MG PO TABS
150.0000 mg | ORAL_TABLET | Freq: Once | ORAL | 0 refills | Status: AC
Start: 2023-03-06 — End: 2023-03-06

## 2023-03-06 NOTE — Telephone Encounter (Signed)
Received Appeal Preparation Checklist from [facility] for Acthar Gel. Completed and faxed back to [facility] with clinicals  Fax: [phone]  Chesley Mires, PharmD, MPH, BCPS, CPP Clinical Pharmacist (Rheumatology and Pulmonology)

## 2023-03-06 NOTE — Patient Instructions (Addendum)
Hi [name],   You can place the vaginal estrogen cream 1/2 gram in the vagina and on the perineum two to three times weekly.   I will see you back for a recheck in 2 weeks.   Conley Simmonds, MD

## 2023-03-06 NOTE — Progress Notes (Signed)
Daily Session Note  Patient Details  Name: [name] MRN: [id number] Date of Birth: May 09, 1966 Referring Provider:   Doristine Devoid Pulmonary Rehab Walk Test from 01/12/2023 in [facility]  Referring Provider [name]       Encounter Date: 03/06/2023  Check In:  Session Check In - 03/06/23 0825       Check-In   Supervising physician immediately available to respond to emergencies [facility] MD immediately available    Physician(s) Jari Favre, NP    Location [facility]    Staff Present Essie Hart, RN, BSN;Randi Idelle Crouch BS, ACSM-CEP, Exercise Physiologist;Keviana Guida Earlene Plater, MS, ACSM-CEP, Exercise Physiologist;Casey Katrinka Blazing, RT    Virtual Visit No    Medication changes reported     No    Fall or balance concerns reported    No    Tobacco Cessation No Change    Warm-up and Cool-down Performed as group-led instruction    Resistance Training Performed Yes    VAD Patient? No    PAD/SET Patient? No      Pain Assessment   Currently in Pain? No/denies    Multiple Pain Sites No             Capillary Blood Glucose: No results found for this or any previous visit (from the past 24 hour(s)).   Exercise Prescription Changes - 03/06/23 0900       Response to Exercise   Blood Pressure (Admit) 132/74    Blood Pressure (Exercise) 148/80    Blood Pressure (Exit) 112/70    Heart Rate (Admit) 80 bpm    Heart Rate (Exercise) 117 bpm    Heart Rate (Exit) 94 bpm    Oxygen Saturation (Admit) 98 %    Oxygen Saturation (Exercise) 97 %    Oxygen Saturation (Exit) 96 %    Rating of Perceived Exertion (Exercise) 13    Perceived Dyspnea (Exercise) 2    Duration Continue with 30 min of aerobic exercise without signs/symptoms of physical distress.    Intensity THRR unchanged      Resistance Training   Training Prescription Yes    Weight black bands    Reps 10-15    Time 10 Minutes      Treadmill   MPH 2.7    Grade  1.5    Minutes 15    METs 3.2      Elliptical   Level 2    Speed 1    Minutes 15    METs 4.3             Social History   Tobacco Use  Smoking Status Never   Passive exposure: Never  Smokeless Tobacco Never    Goals Met:  Proper associated with RPD/PD & O2 Sat Independence with exercise equipment Exercise tolerated well No report of concerns or symptoms today Strength training completed today  Goals Unmet:  Not Applicable  Comments: Service time is from 0818 to [phone].    Dr. Mechele Collin is Medical Director for Pulmonary Rehab at [facility].

## 2023-03-08 ENCOUNTER — Encounter (HOSPITAL_COMMUNITY)
Admission: RE | Admit: 2023-03-08 | Discharge: 2023-03-08 | Disposition: A | Discharge status: Home / Self Care | Payer: BC Managed Care – PPO | Source: Ambulatory Visit | Attending: Pulmonary Disease | Admitting: Pulmonary Disease

## 2023-03-08 ENCOUNTER — Encounter (HOSPITAL_COMMUNITY): Payer: BC Managed Care – PPO

## 2023-03-08 DIAGNOSIS — D869 Sarcoidosis, unspecified: Secondary | ICD-10-CM | POA: Diagnosis not present

## 2023-03-08 LAB — URINE CULTURE
MICRO NUMBER:: 15477455
SPECIMEN QUALITY:: ADEQUATE

## 2023-03-08 LAB — URINALYSIS, COMPLETE W/RFL CULTURE
Bilirubin Urine: NEGATIVE
Glucose, UA: NEGATIVE
Hgb urine dipstick: NEGATIVE
Hyaline Cast: NONE SEEN /LPF
Ketones, ur: NEGATIVE
Nitrites, Initial: NEGATIVE
Protein, ur: NEGATIVE
RBC / HPF: NONE SEEN /HPF (ref 0–2)
Specific Gravity, Urine: 1.002 (ref 1.001–1.035)
pH: 6 (ref 5.0–8.0)

## 2023-03-08 LAB — CULTURE INDICATED

## 2023-03-08 NOTE — Progress Notes (Signed)
Daily Session Note  Patient Details  Name: OFILIA WOLFINGER MRN: [id number] Date of Birth: 1965/12/28 Referring Provider:   Doristine Devoid Pulmonary Rehab Walk Test from 01/12/2023 in [facility]  Referring Provider [name]       Encounter Date: 03/08/2023  Check In:  Session Check In - 03/08/23 0841       Check-In   Supervising physician immediately available to respond to emergencies [facility] MD immediately available    Physician(s) Jari Favre, NP    Location [facility]    Staff Present Essie Hart, RN, BSN;Randi Idelle Crouch BS, ACSM-CEP, Exercise Physiologist;Kaylee Earlene Plater, MS, ACSM-CEP, Exercise Physiologist;Deseray Daponte Katrinka Blazing, RT    Virtual Visit No    Medication changes reported     No    Fall or balance concerns reported    No    Tobacco Cessation No Change    Warm-up and Cool-down Performed as group-led instruction    Resistance Training Performed Yes    VAD Patient? No    PAD/SET Patient? No      Pain Assessment   Currently in Pain? No/denies    Multiple Pain Sites No             Capillary Blood Glucose: No results found for this or any previous visit (from the past 24 hour(s)).    Social History   Tobacco Use  Smoking Status Never   Passive exposure: Never  Smokeless Tobacco Never    Goals Met:  Proper associated with RPD/PD & O2 Sat Independence with exercise equipment Exercise tolerated well No report of concerns or symptoms today Strength training completed today  Goals Unmet:  Not Applicable  Comments: Service time is from 0817 to 0929.    Dr. Mechele Collin is Medical Director for Pulmonary Rehab at [facility].

## 2023-03-08 NOTE — Progress Notes (Signed)
Home Exercise Prescription I have reviewed a Home Exercise Prescription with [name]. [name] is currently exercising at the [facility]. She uses the recumbent elliptical 5-7 days/wk for 10-20 min/day. I encouraged her to increase the time to 30 min/day. She agreed with my recommendations. [name] is motivated to exercise and improve her functional capacity. I am confident in her carrying out an exercise regimen outside of rehab. We also discussed pacing strategies for ADL's as [name] will get short of breath when taking out the trash. [name] understand to start her pursed lip breathing when she is short of breath. The patient stated that their goals were to maintain current health. We reviewed exercise guidelines, target heart rate during exercise, RPE Scale, weather conditions, endpoints for exercise, warmup and cool down. The patient is encouraged to come to me with any questions. I will continue to follow up with the patient to assist them with progression and safety. Spent 15 min with patient discussing home exercise plan and goals  Joya San, MS, ACSM-CEP 03/08/2023 3:26 PM

## 2023-03-08 NOTE — Progress Notes (Signed)
GYNECOLOGY  VISIT   HPI: 57 y.o.   Married  Caucasian  female   G2P2002 with No LMP recorded. Patient has had an ablation.   here for   2 week f/u.  She started vaginal estradiol cream to the vulva to help her perineal biopsy site heal.  The site is still tender with cream application.   Her final UC from 03/06/23 was negative.   On Gemtesa.   Had dry mouth and dry eye. Is doing testing for Sjogren's   States she is supposed to have a bone density done due to taking Acthar gel, which can cause osteoporosis.   GYNECOLOGIC HISTORY: No LMP recorded. Patient has had an ablation. Contraception:  ablation/vasectomy Menopausal hormone therapy:  n/a Last mammogram:  12/11/22 Breast Density Cat A, BI-RADS CAT 1 neg  Last pap smear:   11/28/17-WNL, HPV- neg, 11/26/14-WNL, 06/04/08- WNL         OB History     Gravida  2   Para  2   Term  2   Preterm      AB      Living  2      SAB      IAB      Ectopic      Multiple      Live Births  2              Patient Active Problem List   Diagnosis Date Noted   Mild cognitive impairment 02/02/2023   Migraines    Hypercholesteremia    Generalized anxiety disorder 09/25/2022   Hypertension 09/25/2022   High risk medication use 09/25/2022   Sarcoidosis    Lymphadenopathy 06/13/2022   Splenic lesion 05/20/2022   Type 2 diabetes mellitus without complication, without long-term current use of insulin 05/05/2022   Other allergic rhinitis 04/04/2022   Pain in thoracic spine 07/18/2021   Lumbar pain 07/12/2021   Myalgia, unspecified site 06/21/2021   Contact dermatitis 02/11/2021   Sleep disturbance 10/08/2019   Elevated LFTs 12/12/2018   Mixed hyperlipidemia 12/12/2018   Fen-phen history 11/24/2018   Obstructive sleep apnea 11/24/2018   Morbid obesity 11/24/2018    Past Medical History:  Diagnosis Date   Back pain    Constipation    Contact dermatitis 02/11/2021   Elevated LFTs 12/12/2018   Fatty liver     Fen-phen history 11/24/2018   Generalized anxiety disorder 09/25/2022   Hypercholesteremia    Hypertension    Intestinal bacterial overgrowth    Joint pain    Kidney stones    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   Lumbar pain 07/12/2021   Lymphadenopathy 06/13/2022   Migraines    Mild cognitive impairment 02/02/2023   Mixed hyperlipidemia 12/12/2018   Morbid obesity 11/24/2018   Myalgia, unspecified site 06/21/2021   Nausea in adult    Obstructive sleep apnea    mild, no CPAP   Other allergic rhinitis 04/04/2022   Pain in thoracic spine 07/18/2021   Sarcoidosis    Sleep disturbance 10/08/2019   Splenic lesion 05/20/2022   Subclinical hypothyroidism 09/2009   Type 2 diabetes mellitus without complication, without long-term current use of insulin 05/05/2022    Past Surgical History:  Procedure Laterality Date   BLADDER SUSPENSION N/A 06/05/2022   Procedure: TRANSVAGINAL TAPE (TVT) PROCEDURE;  Surgeon: Marguerita Beards, MD;  Location: [facility] [place];  Service: Gynecology;  Laterality: N/A;  total time requested is 1 hour   COLONOSCOPY  CYSTOSCOPY N/A 06/05/2022   Procedure: CYSTOSCOPY;  Surgeon: Marguerita Beards, MD;  Location: [facility];  Service: Gynecology;  Laterality: N/A;   LITHOTRIPSY     LYMPH NODE DISSECTION     NOVASURE ABLATION  12/18/2006   RADIOACTIVE SEED GUIDED EXCISIONAL BREAST BIOPSY Right 05/30/2021   Procedure: RADIOACTIVE SEED GUIDED EXCISIONAL RIGHT BREAST BIOPSY;  Surgeon: Emelia Loron, MD;  Location: [facility];  Service: General;  Laterality: Right;   ROTATOR CUFF REPAIR Right 06/20/2007    Current Outpatient Medications  Medication Sig Dispense Refill   Budeson-Glycopyrrol-Formoterol (BREZTRI AEROSPHERE) 160-9-4.8 MCG/ACT AERO Inhale 2 puffs into the lungs in the morning and at bedtime. 10.7 g 5   clobetasol ointment (TEMOVATE) 0.05 % Apply 1 Application topically 2 (two) times daily. Use for 2  weeks at a time for a flare.  You may use the cream twice a week at bedtime for maintenance dosing. 60 g 1   Continuous Glucose Sensor (DEXCOM G7 SENSOR) MISC 1 Device by Does not apply route as directed. 9 each 3   Corticotropin Gel (ACTHAR GEL) 40 UNIT/0.5ML AUIJ Inject 40 mg into the skin every 3 (three) days.     EYSUVIS 0.25 % SUSP Apply to eye.     folic acid (FOLVITE) 1 MG tablet Take 2 tablets (2 mg total) by mouth daily. 180 tablet 11   insulin aspart (NOVOLOG FLEXPEN) 100 UNIT/ML FlexPen Max daily 30 units daily 15 mL 11   Insulin Pen Needle 32G X 4 MM MISC 1 Device by Does not apply route in the morning, at noon, in the evening, and at bedtime. 400 each 3   metFORMIN (GLUCOPHAGE-XR) 500 MG 24 hr tablet Take 2 tablets (1,000 mg total) by mouth daily before supper. 180 tablet 3   methotrexate (RHEUMATREX) 2.5 MG tablet Take 8 tablets (20 mg total) by mouth once a week. 96 tablet 1   omeprazole (PRILOSEC) 20 MG capsule Take 1 capsule (20 mg total) by mouth daily. 90 capsule 5   ondansetron (ZOFRAN) 4 MG tablet Take 1 tablet (4 mg total) by mouth daily at 2 PM. 30 tablet 0   predniSONE (DELTASONE) 5 MG tablet Take 2 tablets (10 mg total) by mouth daily with breakfast. 60 tablet 2   tirzepatide (MOUNJARO) 7.5 MG/0.5ML Pen Inject 7.5 mg into the skin once a week. 6 mL 3   valACYclovir (VALTREX) 1000 MG tablet Take 2 Tablets By Mouth Every 12 Hours for 24 hours as needed for an outbreak. 30 tablet 1   Vibegron 75 MG TABS Take 1 tablet (75 mg total) by mouth daily. 90 tablet 3   No current facility-administered medications for this visit.     ALLERGIES: Tizanidine hcl and Clarithromycin  Family History  Problem Relation Age of Onset   Diabetes Mother    Hyperlipidemia Mother    Thyroid disease Mother    Anxiety disorder Mother    Obesity Mother    Aortic stenosis Mother    Arthritis-Osteo Sister    Colon cancer Paternal Grandmother    Heart disease Paternal Grandfather     Pancreatic cancer Neg Hx    Stomach cancer Neg Hx    Liver disease Neg Hx    Esophageal cancer Neg Hx     Social History   Socioeconomic History   Marital status: Married    Spouse name: Alinda Money   Number of children: 2   Years of education: 16   Highest education level: Bachelor's  degree (e.g., BA, AB, BS)  Occupational History   Occupation: IT trainer  Tobacco Use   Smoking status: Never    Passive exposure: Never   Smokeless tobacco: Never  Vaping Use   Vaping status: Never Used  Substance and Sexual Activity   Alcohol use: No    Alcohol/week: 0.0 standard drinks of alcohol   Drug use: No   Sexual activity: Yes    Partners: Male    Birth control/protection: Surgical    Comment: Ablation, Vasectomy  Other Topics Concern   Not on file  Social History Narrative   Right handed   Drinks coffee   Lives with husband   Two story home   unemployed   Social Determinants of Health   Financial Resource Strain: Not on file  Food Insecurity: Not on file  Transportation Needs: Not on file  Physical Activity: Not on file  Stress: Not on file  Social Connections: Unknown (11/01/2021)   Received from [facility]   Social Network    Social Network: Not on file  Intimate Partner Violence: Unknown (09/23/2021)   Received from [facility]   HITS    Physically Hurt: Not on file    Insult or Talk Down To: Not on file    Threaten Physical Harm: Not on file    Scream or Curse: Not on file    Review of Systems  All other systems reviewed and are negative.   PHYSICAL EXAMINATION:    BP 130/86 (BP Location: Right Arm, Patient Position: Sitting, Cuff Size: Normal)   Pulse 88   Ht 5\' 1"  (1.549 m)   Wt 187 lb (84.8 kg)   SpO2 96%   BMI 35.33 kg/m     General appearance: alert, cooperative and appears stated age Head: Normocephalic, without obvious abnormality, atraumatic Neck: no adenopathy, supple, symmetrical, trachea midline and thyroid normal to  inspection and palpation Lungs: clear to auscultation bilaterally Breasts: normal appearance, no masses or tenderness, No nipple retraction or dimpling, No nipple discharge or bleeding, No axillary or supraclavicular adenopathy Heart: regular rate and rhythm Abdomen: soft, non-tender, no masses,  no organomegaly Extremities: extremities normal, atraumatic, no cyanosis or edema Skin: Skin color, texture, turgor normal. No rashes or lesions Lymph nodes: Cervical, supraclavicular, and axillary nodes normal. No abnormal inguinal nodes palpated Neurologic: Grossly normal  Pelvic: External genitalia:  small amount of granulation tissue at perineal biopsy site treated with silver nitrate.              Urethra:  normal appearing urethra with no masses, tenderness or lesions  Chaperone was present for exam:  Warren Lacy, CMA  ASSESSMENT  Small amount of granulation tissue of perineum biopsy site. Osteoporosis screening.  Taking medication which increases risk of osteoporosis.  PLAN  Wait one week and then start using vaginal estrogen cream three times weekly.  BMD at [facility].  Keep annual exam for November.    An After Visit Summary was printed and given to the patient.  22 min  total time was spent for this patient encounter, including preparation, face-to-face counseling with the patient, coordination of care, and documentation of the encounter.

## 2023-03-13 ENCOUNTER — Encounter (HOSPITAL_COMMUNITY): Payer: BC Managed Care – PPO

## 2023-03-13 ENCOUNTER — Encounter (HOSPITAL_COMMUNITY)
Admission: RE | Admit: 2023-03-13 | Discharge: 2023-03-13 | Disposition: A | Discharge status: Home / Self Care | Payer: BC Managed Care – PPO | Source: Ambulatory Visit | Attending: Pulmonary Disease | Admitting: Pulmonary Disease

## 2023-03-13 DIAGNOSIS — D869 Sarcoidosis, unspecified: Secondary | ICD-10-CM

## 2023-03-13 NOTE — Progress Notes (Signed)
Daily Session Note  Patient Details  Name: [name] MRN: [id number] Date of Birth: 01-13-1966 Referring Provider:   Doristine Devoid Pulmonary Rehab Walk Test from 01/12/2023 in [facility]  Referring Provider [name]       Encounter Date: 03/13/2023  Check In:  Session Check In - 03/13/23 0826       Check-In   Supervising physician immediately available to respond to emergencies [facility] MD immediately available    Physician(s) Carlyon Shadow, NP    Location [facility]    Staff Present Essie Hart, RN, BSN;Randi Idelle Crouch BS, ACSM-CEP, Exercise Physiologist;Sherial Ebrahim Earlene Plater, MS, ACSM-CEP, Exercise Physiologist;Casey Katrinka Blazing, RT    Virtual Visit No    Medication changes reported     No    Fall or balance concerns reported    No    Tobacco Cessation No Change    Warm-up and Cool-down Performed as group-led instruction    Resistance Training Performed Yes    VAD Patient? No    PAD/SET Patient? No      Pain Assessment   Currently in Pain? No/denies    Multiple Pain Sites No             Capillary Blood Glucose: No results found for this or any previous visit (from the past 24 hour(s)).    Social History   Tobacco Use  Smoking Status Never   Passive exposure: Never  Smokeless Tobacco Never    Goals Met:  Proper associated with RPD/PD & O2 Sat Independence with exercise equipment Exercise tolerated well No report of concerns or symptoms today Strength training completed today  Goals Unmet:  Not Applicable  Comments: Service time is from 0817 to 0925.    Dr. Mechele Collin is Medical Director for Pulmonary Rehab at [facility].

## 2023-03-14 LAB — HM DIABETES EYE EXAM

## 2023-03-14 NOTE — Telephone Encounter (Signed)
Returned call to [employer] regarding appeal letter that needs to be addedned AGAIN for grammatical issues. They will addend and fax back  Chesley Mires, PharmD, MPH, BCPS, CPP Clinical Pharmacist (Rheumatology and Pulmonology)

## 2023-03-15 ENCOUNTER — Encounter (HOSPITAL_COMMUNITY)
Admission: RE | Admit: 2023-03-15 | Discharge: 2023-03-15 | Disposition: A | Discharge status: Home / Self Care | Payer: BC Managed Care – PPO | Source: Ambulatory Visit | Attending: Pulmonary Disease | Admitting: Pulmonary Disease

## 2023-03-15 ENCOUNTER — Encounter (HOSPITAL_COMMUNITY): Payer: BC Managed Care – PPO

## 2023-03-15 DIAGNOSIS — D869 Sarcoidosis, unspecified: Secondary | ICD-10-CM

## 2023-03-15 NOTE — Telephone Encounter (Signed)
Received updated appeal letter from [place]. Emailed to Dr. [name] for review and signature  Chesley Mires, PharmD, MPH, BCPS, CPP Clinical Pharmacist (Rheumatology and Pulmonology)

## 2023-03-15 NOTE — Progress Notes (Signed)
Daily Session Note  Patient Details  Name: [name] MRN: [id number] Date of Birth: 18-Mar-1966 Referring Provider:   Doristine Devoid Pulmonary Rehab Walk Test from 01/12/2023 in [facility]  Referring Provider [name]       Encounter Date: 03/15/2023  Check In:  Session Check In - 03/15/23 1324       Check-In   Supervising physician immediately available to respond to emergencies [facility] MD immediately available    Physician(s) Edd Fabian, NP    [facility]    Staff Present Essie Hart, RN, BSN;Randi Idelle Crouch BS, ACSM-CEP, Exercise Physiologist;Anyla Israelson Earlene Plater, MS, ACSM-CEP, Exercise Physiologist;Casey Hermine Messick Belarus, RD, LDN    Virtual Visit No    Medication changes reported     No    Fall or balance concerns reported    No    Tobacco Cessation No Change    Warm-up and Cool-down Performed as group-led instruction    Resistance Training Performed Yes    VAD Patient? No    PAD/SET Patient? No      Pain Assessment   Currently in Pain? No/denies             Capillary Blood Glucose: No results found for this or any previous visit (from the past 24 hour(s)).    Social History   Tobacco Use  Smoking Status Never   Passive exposure: Never  Smokeless Tobacco Never    Goals Met:  Proper associated with RPD/PD & O2 Sat Independence with exercise equipment Exercise tolerated well No report of concerns or symptoms today Strength training completed today  Goals Unmet:  Not Applicable  Comments: Service time is from 0818 to [phone].    Dr. Mechele Collin is Medical Director for Pulmonary Rehab at [facility].

## 2023-03-15 NOTE — Telephone Encounter (Signed)
Signed [employer] appeal letter received. Faxed to [place]   Fax: [phone]  Chesley Mires, PharmD, MPH, BCPS, CPP Clinical Pharmacist (Rheumatology and Pulmonology)

## 2023-03-15 NOTE — Telephone Encounter (Signed)
Received and signed. Please let me know if you have not received it.

## 2023-03-16 NOTE — Telephone Encounter (Signed)
Received fax from [facility] that appeal for [employer] has been submitted. This will take 2-6 weeks before appeal is finalized  Chesley Mires, PharmD, MPH, BCPS, CPP Clinical Pharmacist (Rheumatology and Pulmonology)

## 2023-03-19 ENCOUNTER — Encounter (HOSPITAL_BASED_OUTPATIENT_CLINIC_OR_DEPARTMENT_OTHER): Payer: Self-pay | Admitting: Pulmonary Disease

## 2023-03-19 DIAGNOSIS — Z79899 Other long term (current) drug therapy: Secondary | ICD-10-CM

## 2023-03-20 ENCOUNTER — Encounter (HOSPITAL_COMMUNITY)
Admission: RE | Admit: 2023-03-20 | Discharge: 2023-03-20 | Disposition: A | Discharge status: Home / Self Care | Payer: BC Managed Care – PPO | Source: Ambulatory Visit | Attending: Pulmonary Disease | Admitting: Pulmonary Disease

## 2023-03-20 ENCOUNTER — Encounter (HOSPITAL_COMMUNITY): Payer: BC Managed Care – PPO

## 2023-03-20 ENCOUNTER — Encounter (HOSPITAL_BASED_OUTPATIENT_CLINIC_OR_DEPARTMENT_OTHER): Payer: Self-pay | Admitting: Pulmonary Disease

## 2023-03-20 VITALS — Wt 189.2 lb

## 2023-03-20 DIAGNOSIS — D869 Sarcoidosis, unspecified: Secondary | ICD-10-CM | POA: Diagnosis present

## 2023-03-20 DIAGNOSIS — Z79899 Other long term (current) drug therapy: Secondary | ICD-10-CM | POA: Diagnosis not present

## 2023-03-20 DIAGNOSIS — I10 Essential (primary) hypertension: Secondary | ICD-10-CM | POA: Diagnosis not present

## 2023-03-20 DIAGNOSIS — Z6836 Body mass index (BMI) 36.0-36.9, adult: Secondary | ICD-10-CM | POA: Diagnosis not present

## 2023-03-20 DIAGNOSIS — E119 Type 2 diabetes mellitus without complications: Secondary | ICD-10-CM | POA: Insufficient documentation

## 2023-03-20 DIAGNOSIS — R0602 Shortness of breath: Secondary | ICD-10-CM | POA: Diagnosis present

## 2023-03-20 NOTE — Progress Notes (Signed)
Daily Session Note  Patient Details  Name: [name] MRN: [id number] Date of Birth: 03-Feb-1966 Referring Provider:   Doristine Devoid Pulmonary Rehab Walk Test from 01/12/2023 in [facility], V[facility], [facility] Lung [facility]  Referring Provider [name]       Encounter Date: 03/20/2023  Check In:  Session Check In - 03/20/23 0830       Check-In   Supervising physician immediately available to respond to emergencies [facility] MD immediately available    Physician(s) Robin Searing, NP    [facility]    Staff Present Essie Hart, RN, BSN;Randi Idelle Crouch BS, ACSM-CEP, Exercise Physiologist;Kaylee Earlene Plater, MS, ACSM-CEP, Exercise Physiologist;Vishaal Strollo Katrinka Blazing, RT    Virtual Visit No    Medication changes reported     No    Fall or balance concerns reported    No    Tobacco Cessation No Change    Warm-up and Cool-down Performed as group-led instruction    Resistance Training Performed Yes    VAD Patient? No    PAD/SET Patient? No      Pain Assessment   Currently in Pain? No/denies    Multiple Pain Sites No             Capillary Blood Glucose: No results found for this or any previous visit (from the past 24 hour(s)).   Exercise Prescription Changes - 03/20/23 0900       Response to Exercise   Blood Pressure (Admit) 130/88    Blood Pressure (Exercise) 138/78    Blood Pressure (Exit) 124/76    Heart Rate (Admit) 97 bpm    Heart Rate (Exercise) 111 bpm    Heart Rate (Exit) 92 bpm    Oxygen Saturation (Admit) 97 %    Oxygen Saturation (Exercise) 97 %    Oxygen Saturation (Exit) 97 %    Rating of Perceived Exertion (Exercise) 13    Perceived Dyspnea (Exercise) 1    Duration Continue with 30 min of aerobic exercise without signs/symptoms of physical distress.    Intensity THRR unchanged      Resistance Training   Training Prescription Yes    Weight black bands    Reps 10-15    Time 10 Minutes      Treadmill   MPH 2.6    Grade  1.5    Minutes 15    METs 3.2      Elliptical   Level 2    Speed 1    Minutes 15    METs 3.9             Social History   Tobacco Use  Smoking Status Never   Passive exposure: Never  Smokeless Tobacco Never    Goals Met:  Proper associated with RPD/PD & O2 Sat Independence with exercise equipment Exercise tolerated well No report of concerns or symptoms today Strength training completed today  Goals Unmet:  Not Applicable  Comments: Service time is from 0820 to 0916.    Dr. Mechele Collin is Medical Director for Pulmonary Rehab at [facility].

## 2023-03-20 NOTE — Telephone Encounter (Signed)
Please request ONO results from DME.

## 2023-03-21 ENCOUNTER — Encounter: Payer: Self-pay | Admitting: Internal Medicine

## 2023-03-21 ENCOUNTER — Ambulatory Visit: Payer: BC Managed Care – PPO | Admitting: Internal Medicine

## 2023-03-21 ENCOUNTER — Encounter (HOSPITAL_BASED_OUTPATIENT_CLINIC_OR_DEPARTMENT_OTHER): Payer: Self-pay | Admitting: Pulmonary Disease

## 2023-03-21 VITALS — BP 126/88 | HR 85 | Ht 61.0 in | Wt 187.0 lb

## 2023-03-21 DIAGNOSIS — Z7984 Long term (current) use of oral hypoglycemic drugs: Secondary | ICD-10-CM

## 2023-03-21 DIAGNOSIS — Z7985 Long-term (current) use of injectable non-insulin antidiabetic drugs: Secondary | ICD-10-CM

## 2023-03-21 DIAGNOSIS — E042 Nontoxic multinodular goiter: Secondary | ICD-10-CM | POA: Diagnosis not present

## 2023-03-21 DIAGNOSIS — E119 Type 2 diabetes mellitus without complications: Secondary | ICD-10-CM | POA: Diagnosis not present

## 2023-03-21 LAB — MICROALBUMIN / CREATININE URINE RATIO
Creatinine,U: 22.7 mg/dL
Microalb Creat Ratio: 3.1 mg/g (ref 0.0–30.0)
Microalb, Ur: <0.7 mg/dL (ref 0.0–1.9)

## 2023-03-21 LAB — POCT GLYCOSYLATED HEMOGLOBIN (HGB A1C): Hemoglobin A1C: 5.7 % — AB (ref 4.0–5.6)

## 2023-03-21 MED ORDER — DEXCOM G7 SENSOR MISC: 1.0000 | 3 refills | Status: DC

## 2023-03-21 MED ORDER — METFORMIN HCL ER 500 MG PO TB24: 1000.0000 mg | ORAL_TABLET | Freq: Every day | ORAL | 3 refills | Status: DC

## 2023-03-21 MED ORDER — TIRZEPATIDE 7.5 MG/0.5ML ~~LOC~~ SOAJ: 7.5000 mg | SUBCUTANEOUS | 3 refills | Status: DC

## 2023-03-21 MED ORDER — NOVOLOG FLEXPEN 100 UNIT/ML ~~LOC~~ SOPN: PEN_INJECTOR | SUBCUTANEOUS | 11 refills | Status: DC

## 2023-03-21 MED ORDER — INSULIN PEN NEEDLE 32G X 4 MM MISC: 1.0000 | Freq: Four times a day (QID) | 3 refills | Status: DC

## 2023-03-21 NOTE — Progress Notes (Signed)
Pulmonary Individual Treatment Plan  Patient Details  Name: [name] MRN: [id number] Date of Birth: Mar 27, 1966 Referring Provider:   Doristine Devoid Pulmonary Rehab Walk Test from 01/12/2023 in [facility]  Referring Provider [name]       Initial Encounter Date:  Flowsheet Row Pulmonary Rehab Walk Test from 01/12/2023 in [facility]  Date 01/12/23       Visit Diagnosis: Sarcoidosis  Patient's Home Medications on Admission:   Current Outpatient Medications:    Budeson-Glycopyrrol-Formoterol (BREZTRI AEROSPHERE) 160-9-4.8 MCG/ACT AERO, Inhale 2 puffs into the lungs in the morning and at bedtime., Disp: 10.7 g, Rfl: 5   cephALEXin (KEFLEX) 500 MG capsule, Take 1 capsule (500 mg total) by mouth 2 (two) times daily., Disp: 14 capsule, Rfl: 0   clobetasol ointment (TEMOVATE) 0.05 %, Apply 1 Application topically 2 (two) times daily. Use for 2 weeks at a time for a flare.  You may use the cream twice a week at bedtime for maintenance dosing., Disp: 60 g, Rfl: 1   Corticotropin Gel (ACTHAR GEL) 40 UNIT/0.5ML AUIJ, Inject 40 mg into the skin every 3 (three) days., Disp: , Rfl:    folic acid (FOLVITE) 1 MG tablet, Take 2 tablets (2 mg total) by mouth daily., Disp: 180 tablet, Rfl: 11   metFORMIN (GLUCOPHAGE-XR) 500 MG 24 hr tablet, Take 2 tablets (1,000 mg total) by mouth daily before supper., Disp: 180 tablet, Rfl: 3   methotrexate (RHEUMATREX) 2.5 MG tablet, Take 8 tablets (20 mg total) by mouth once a week., Disp: 96 tablet, Rfl: 1   omeprazole (PRILOSEC) 20 MG capsule, Take 1 capsule (20 mg total) by mouth daily., Disp: 90 capsule, Rfl: 5   ondansetron (ZOFRAN) 4 MG tablet, Take 1 tablet (4 mg total) by mouth daily at 2 PM., Disp: 30 tablet, Rfl: 0   predniSONE (DELTASONE) 5 MG tablet, Take 2 tablets (10 mg total) by mouth daily with breakfast., Disp: 60 tablet, Rfl: 2    tirzepatide (MOUNJARO) 5 MG/0.5ML Pen, Inject 5 mg into the skin once a week., Disp: 6 mL, Rfl: 3   valACYclovir (VALTREX) 1000 MG tablet, Take 2 Tablets By Mouth Every 12 Hours for 24 hours as needed for an outbreak., Disp: 30 tablet, Rfl: 1   Vibegron 75 MG TABS, Take 1 tablet (75 mg total) by mouth daily., Disp: 90 tablet, Rfl: 3  Past Medical History: Past Medical History:  Diagnosis Date   Back pain    Constipation    Contact dermatitis 02/11/2021   Elevated LFTs 12/12/2018   Fatty liver    Fen-phen history 11/24/2018   Generalized anxiety disorder 09/25/2022   Hypercholesteremia    Hypertension    Intestinal bacterial overgrowth    Joint pain    Kidney stones    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   Lumbar pain 07/12/2021   Lymphadenopathy 06/13/2022   Migraines    Mild cognitive impairment 02/02/2023   Mixed hyperlipidemia 12/12/2018   Morbid obesity 11/24/2018   Myalgia, unspecified site 06/21/2021   Nausea in adult    Obstructive sleep apnea    mild, no CPAP   Other allergic rhinitis 04/04/2022   Pain in thoracic spine 07/18/2021   Sarcoidosis    Sleep disturbance 10/08/2019   Splenic lesion 05/20/2022   Subclinical hypothyroidism 09/2009   Type 2 diabetes mellitus without complication, without long-term current use of insulin 05/05/2022  Tobacco Use: Social History   Tobacco Use  Smoking Status Never   Passive exposure: Never  Smokeless Tobacco Never    Labs: Review Flowsheet  More data exists      Latest Ref Rng & Units 12/06/2018 12/15/2019 05/05/2022 06/05/2022 09/19/2022  Labs for ITP Cardiac and Pulmonary Rehab  Cholestrol 100 - 199 mg/dL 161  096  - - -  LDL (calc) 0 - 99 mg/dL 045  409     811  - - -  HDL-C >39 mg/dL 65  65     73  - - -  Trlycerides 0 - 149 mg/dL 914  782  - - -  Hemoglobin A1c 4.0 - 5.6 % 6.2  6.3  6.0  - 6.6   TCO2 22 - 32 mmol/L - - - 28  -    Details       This result is from an external source.   Multiple values from one  day are sorted in reverse-chronological order         Capillary Blood Glucose: Lab Results  Component Value Date   GLUCAP 118 (H) 02/27/2023   GLUCAP 167 (H) 02/27/2023   GLUCAP 118 (H) 06/05/2022   GLUCAP 107 (H) 05/24/2022    POCT Glucose     Row Name 01/12/23 0949             POCT Blood Glucose   Pre-Exercise 122 mg/dL                Pulmonary Assessment Scores:  Pulmonary Assessment Scores     Row Name 01/12/23 0956         ADL UCSD   ADL Phase Entry     SOB Score total 19       CAT Score   CAT Score 11       mMRC Score   mMRC Score 2             UCSD: Self-administered rating of dyspnea associated with activities of daily living (ADLs) 6-point scale (0 = "not at all" to 5 = "maximal or unable to do because of breathlessness")  Scoring Scores range from 0 to 120.  Minimally important difference is 5 units  CAT: CAT can identify the health impairment of COPD patients and is better correlated with disease progression.  CAT has a scoring range of zero to 40. The CAT score is classified into four groups of low (less than 10), medium (10 - 20), high (21-30) and very high (31-40) based on the impact level of disease on health status. A CAT score over 10 suggests significant symptoms.  A worsening CAT score could be explained by an exacerbation, poor medication adherence, poor inhaler technique, or progression of COPD or comorbid conditions.  CAT MCID is 2 points  mMRC: mMRC (Modified Medical Research Council) Dyspnea Scale is used to assess the degree of baseline functional disability in patients of respiratory disease due to dyspnea. No minimal important difference is established. A decrease in score of 1 point or greater is considered a positive change.   Pulmonary Function Assessment:  Pulmonary Function Assessment - 01/12/23 0930       Breath   Bilateral Breath Sounds Decreased    Shortness of Breath Yes;Fear of Shortness of Breath;Limiting  activity             Exercise Target Goals: Exercise Program Goal: Individual exercise prescription set using results from initial 6 min walk test and THRR while  considering  patient's activity barriers and safety.   Exercise Prescription Goal: Initial exercise prescription builds to 30-45 minutes a day of aerobic activity, 2-3 days per week.  Home exercise guidelines will be given to patient during program as part of exercise prescription that the participant will acknowledge.  Activity Barriers & Risk Stratification:  Activity Barriers & Cardiac Risk Stratification - 01/12/23 0915       Activity Barriers & Cardiac Risk Stratification   Activity Barriers Balance Concerns;Deconditioning;Muscular Weakness;Shortness of Breath             6 Minute Walk:  6 Minute Walk     Row Name 01/12/23 0958         6 Minute Walk   Phase Initial     Distance 1090 feet     Walk Time 6 minutes     # of Rest Breaks 0     MPH 2.06     METS 2.82     RPE 10     Perceived Dyspnea  1     VO2 Peak 9.88     Symptoms No     Resting HR 89 bpm     Resting BP 122/88     Resting Oxygen Saturation  98 %     Exercise Oxygen Saturation  during 6 min walk 95 %     Max Ex. HR 108 bpm     Max Ex. BP 126/72     2 Minute Post BP 114/70       Interval HR   1 Minute HR 104     2 Minute HR 106     3 Minute HR 104     4 Minute HR 106     5 Minute HR 102     6 Minute HR 108     2 Minute Post HR 92     Interval Heart Rate? Yes       Interval Oxygen   Interval Oxygen? Yes     Baseline Oxygen Saturation % 98 %     1 Minute Oxygen Saturation % 95 %     1 Minute Liters of Oxygen 0 L     2 Minute Oxygen Saturation % 97 %     2 Minute Liters of Oxygen 0 L     3 Minute Oxygen Saturation % 98 %     3 Minute Liters of Oxygen 0 L     4 Minute Oxygen Saturation % 97 %     4 Minute Liters of Oxygen 0 L     5 Minute Oxygen Saturation % 99 %     5 Minute Liters of Oxygen 0 L     6 Minute Oxygen  Saturation % 100 %     6 Minute Liters of Oxygen 0 L     2 Minute Post Oxygen Saturation % 100 %     2 Minute Post Liters of Oxygen 0 L              Oxygen Initial Assessment:  Oxygen Initial Assessment - 01/12/23 0926       Home Oxygen   Home Oxygen Device None    Sleep Oxygen Prescription None    Home Exercise Oxygen Prescription None    Home Resting Oxygen Prescription None      Initial 6 min Walk   Oxygen Used None      Program Oxygen Prescription   Program Oxygen Prescription None  Intervention   Short Term Goals To learn and understand importance of maintaining oxygen saturations>88%;To learn and demonstrate proper use of respiratory medications;To learn and understand importance of monitoring SPO2 with pulse oximeter and demonstrate accurate use of the pulse oximeter.;To learn and demonstrate proper pursed lip breathing techniques or other breathing techniques.     Long  Term Goals Maintenance of O2 saturations>88%;Compliance with respiratory medication;Verbalizes importance of monitoring SPO2 with pulse oximeter and return demonstration;Exhibits proper breathing techniques, such as pursed lip breathing or other method taught during program session;Demonstrates proper use of MDI's             Oxygen Re-Evaluation:  Oxygen Re-Evaluation     Row Name 01/18/23 0919 02/12/23 1226 03/13/23 1605         Program Oxygen Prescription   Program Oxygen Prescription None None None       Home Oxygen   Home Oxygen Device None None None     Sleep Oxygen Prescription None None None     Home Exercise Oxygen Prescription None None None     Home Resting Oxygen Prescription None None None       Goals/Expected Outcomes   Short Term Goals To learn and understand importance of maintaining oxygen saturations>88%;To learn and demonstrate proper use of respiratory medications;To learn and understand importance of monitoring SPO2 with pulse oximeter and demonstrate accurate use  of the pulse oximeter.;To learn and demonstrate proper pursed lip breathing techniques or other breathing techniques.  To learn and understand importance of maintaining oxygen saturations>88%;To learn and demonstrate proper use of respiratory medications;To learn and understand importance of monitoring SPO2 with pulse oximeter and demonstrate accurate use of the pulse oximeter.;To learn and demonstrate proper pursed lip breathing techniques or other breathing techniques.  To learn and understand importance of maintaining oxygen saturations>88%;To learn and demonstrate proper use of respiratory medications;To learn and understand importance of monitoring SPO2 with pulse oximeter and demonstrate accurate use of the pulse oximeter.;To learn and demonstrate proper pursed lip breathing techniques or other breathing techniques.      Long  Term Goals Maintenance of O2 saturations>88%;Compliance with respiratory medication;Verbalizes importance of monitoring SPO2 with pulse oximeter and return demonstration;Exhibits proper breathing techniques, such as pursed lip breathing or other method taught during program session;Demonstrates proper use of MDI's Maintenance of O2 saturations>88%;Compliance with respiratory medication;Verbalizes importance of monitoring SPO2 with pulse oximeter and return demonstration;Exhibits proper breathing techniques, such as pursed lip breathing or other method taught during program session;Demonstrates proper use of MDI's Maintenance of O2 saturations>88%;Compliance with respiratory medication;Verbalizes importance of monitoring SPO2 with pulse oximeter and return demonstration;Exhibits proper breathing techniques, such as pursed lip breathing or other method taught during program session;Demonstrates proper use of MDI's     Goals/Expected Outcomes Compliance and understanding of oxygen saturation monitoring and breathing techniques to decrease shortness of breath Compliance and understanding  of oxygen saturation monitoring and breathing techniques to decrease shortness of breath Compliance and understanding of oxygen saturation monitoring and breathing techniques to decrease shortness of breath              Oxygen Discharge (Final Oxygen Re-Evaluation):  Oxygen Re-Evaluation - 03/13/23 1605       Program Oxygen Prescription   Program Oxygen Prescription None      Home Oxygen   Home Oxygen Device None    Sleep Oxygen Prescription None    Home Exercise Oxygen Prescription None    Home Resting Oxygen Prescription None      Goals/Expected  Outcomes   Short Term Goals To learn and understand importance of maintaining oxygen saturations>88%;To learn and demonstrate proper use of respiratory medications;To learn and understand importance of monitoring SPO2 with pulse oximeter and demonstrate accurate use of the pulse oximeter.;To learn and demonstrate proper pursed lip breathing techniques or other breathing techniques.     Long  Term Goals Maintenance of O2 saturations>88%;Compliance with respiratory medication;Verbalizes importance of monitoring SPO2 with pulse oximeter and return demonstration;Exhibits proper breathing techniques, such as pursed lip breathing or other method taught during program session;Demonstrates proper use of MDI's    Goals/Expected Outcomes Compliance and understanding of oxygen saturation monitoring and breathing techniques to decrease shortness of breath             Initial Exercise Prescription:  Initial Exercise Prescription - 01/12/23 1000       Date of Initial Exercise RX and Referring Provider   Date 01/12/23    Referring Provider [name]    Expected Discharge Date 04/12/23      Treadmill   MPH 2    Grade 0    Minutes 15      Elliptical   Level 1    Speed 1    Minutes 15      Prescription Details   Frequency (times per week) 2    Duration Progress to 30 minutes of continuous aerobic without signs/symptoms of physical distress       Intensity   THRR 40-80% of Max Heartrate 66-131    Ratings of Perceived Exertion 11-13    Perceived Dyspnea 0-4      Progression   Progression Continue to progress workloads to maintain intensity without signs/symptoms of physical distress.      Resistance Training   Training Prescription Yes    Weight black bands    Reps 10-15             Perform Capillary Blood Glucose checks as needed.  Exercise Prescription Changes:   Exercise Prescription Changes     Row Name 01/30/23 1500 03/06/23 0900 03/20/23 0900         Response to Exercise   Blood Pressure (Admit) 130/80 132/74 130/88     Blood Pressure (Exercise) 142/80 148/80 138/78     Blood Pressure (Exit) 126/76 112/70 124/76     Heart Rate (Admit) 96 bpm 80 bpm 97 bpm     Heart Rate (Exercise) 118 bpm 117 bpm 111 bpm     Heart Rate (Exit) 104 bpm 94 bpm 92 bpm     Oxygen Saturation (Admit) 96 % 98 % 97 %     Oxygen Saturation (Exercise) 96 % 97 % 97 %     Oxygen Saturation (Exit) 98 % 96 % 97 %     Rating of Perceived Exertion (Exercise) 11 13 13      Perceived Dyspnea (Exercise) 1 2 1      Duration Continue with 30 min of aerobic exercise without signs/symptoms of physical distress. Continue with 30 min of aerobic exercise without signs/symptoms of physical distress. Continue with 30 min of aerobic exercise without signs/symptoms of physical distress.     Intensity THRR unchanged THRR unchanged THRR unchanged       Resistance Training   Training Prescription Yes Yes Yes     Weight black bands black bands black bands     Reps 10-15 10-15 10-15     Time 10 Minutes 10 Minutes 10 Minutes       Treadmill   MPH 2  2.7 2.6     Grade 0 1.5 1.5     Minutes 15 15 15      METs 2.53 3.2 3.2       Elliptical   Level 1 2 2      Speed 1 1 1      Minutes 15 15 15      METs -- 4.3 3.9              Exercise Comments:   Exercise Comments     Row Name 01/30/23 1509 03/08/23 1522         Exercise Comments Donnise  completed her first day of exercise. She exercised for 15 min on upright elliptical and treadmill. Ameira averaged 4.3 METs at 1 speed and incline on the upright elliptical and 2.4 METs at 2.0 mph on the treadmill. Elaisha performed the warmup and cooldown standing without limitations. Discussed METs. Completed home ExRx. Dorreen is currently exercising at the [facility]. She uses the recumbent elliptical 5-7 days/wk for 10-20 min/day. I encouraged her to increase the time to 30 min/day. She agreed with my recommendations. Damary is motivated to exercise and improve her functional capacity. I am confident in her carrying out an exercise regimen outside of rehab. We also discussed pacing strategies for ADL's as Meca will get short of breath when taking out the trash. Tamecka understand to start her pursed lip breathing when she is short of breath.               Exercise Goals and Review:   Exercise Goals     Row Name 01/12/23 1610 01/18/23 0919 01/19/23 1541 02/12/23 1217       Exercise Goals   Increase Physical Activity Yes Yes Yes Yes    Intervention Provide advice, education, support and counseling about physical activity/exercise needs.;Develop an individualized exercise prescription for aerobic and resistive training based on initial evaluation findings, risk stratification, comorbidities and participant's personal goals. Provide advice, education, support and counseling about physical activity/exercise needs.;Develop an individualized exercise prescription for aerobic and resistive training based on initial evaluation findings, risk stratification, comorbidities and participant's personal goals. Provide advice, education, support and counseling about physical activity/exercise needs.;Develop an individualized exercise prescription for aerobic and resistive training based on initial evaluation findings, risk stratification, comorbidities and participant's personal goals. Provide advice, education,  support and counseling about physical activity/exercise needs.;Develop an individualized exercise prescription for aerobic and resistive training based on initial evaluation findings, risk stratification, comorbidities and participant's personal goals.    Expected Outcomes Short Term: Attend rehab on a regular basis to increase amount of physical activity.;Long Term: Exercising regularly at least 3-5 days a week.;Long Term: Add in home exercise to make exercise part of routine and to increase amount of physical activity. Short Term: Attend rehab on a regular basis to increase amount of physical activity.;Long Term: Exercising regularly at least 3-5 days a week.;Long Term: Add in home exercise to make exercise part of routine and to increase amount of physical activity. Short Term: Attend rehab on a regular basis to increase amount of physical activity.;Long Term: Exercising regularly at least 3-5 days a week.;Long Term: Add in home exercise to make exercise part of routine and to increase amount of physical activity. Short Term: Attend rehab on a regular basis to increase amount of physical activity.;Long Term: Exercising regularly at least 3-5 days a week.;Long Term: Add in home exercise to make exercise part of routine and to increase amount of physical activity.    Increase Strength  and Stamina Yes Yes Yes Yes    Intervention Provide advice, education, support and counseling about physical activity/exercise needs.;Develop an individualized exercise prescription for aerobic and resistive training based on initial evaluation findings, risk stratification, comorbidities and participant's personal goals. Provide advice, education, support and counseling about physical activity/exercise needs.;Develop an individualized exercise prescription for aerobic and resistive training based on initial evaluation findings, risk stratification, comorbidities and participant's personal goals. Provide advice, education, support  and counseling about physical activity/exercise needs.;Develop an individualized exercise prescription for aerobic and resistive training based on initial evaluation findings, risk stratification, comorbidities and participant's personal goals. Provide advice, education, support and counseling about physical activity/exercise needs.;Develop an individualized exercise prescription for aerobic and resistive training based on initial evaluation findings, risk stratification, comorbidities and participant's personal goals.    Expected Outcomes Short Term: Increase workloads from initial exercise prescription for resistance, speed, and METs.;Short Term: Perform resistance training exercises routinely during rehab and add in resistance training at home;Long Term: Improve cardiorespiratory fitness, muscular endurance and strength as measured by increased METs and functional capacity ( ) Short Term: Increase workloads from initial exercise prescription for resistance, speed, and METs.;Short Term: Perform resistance training exercises routinely during rehab and add in resistance training at home;Long Term: Improve cardiorespiratory fitness, muscular endurance and strength as measured by increased METs and functional capacity ( ) Short Term: Increase workloads from initial exercise prescription for resistance, speed, and METs.;Short Term: Perform resistance training exercises routinely during rehab and add in resistance training at home;Long Term: Improve cardiorespiratory fitness, muscular endurance and strength as measured by increased METs and functional capacity ( ) Short Term: Increase workloads from initial exercise prescription for resistance, speed, and METs.;Short Term: Perform resistance training exercises routinely during rehab and add in resistance training at home;Long Term: Improve cardiorespiratory fitness, muscular endurance and strength as measured by increased METs and functional capacity ( )     Able to understand and use rate of perceived exertion (RPE) scale Yes Yes Yes Yes    Intervention Provide education and explanation on how to use RPE scale Provide education and explanation on how to use RPE scale Provide education and explanation on how to use RPE scale Provide education and explanation on how to use RPE scale    Expected Outcomes Short Term: Able to use RPE daily in rehab to express subjective intensity level;Long Term:  Able to use RPE to guide intensity level when exercising independently Short Term: Able to use RPE daily in rehab to express subjective intensity level;Long Term:  Able to use RPE to guide intensity level when exercising independently Short Term: Able to use RPE daily in rehab to express subjective intensity level;Long Term:  Able to use RPE to guide intensity level when exercising independently Short Term: Able to use RPE daily in rehab to express subjective intensity level;Long Term:  Able to use RPE to guide intensity level when exercising independently    Able to understand and use Dyspnea scale Yes Yes Yes Yes    Intervention Provide education and explanation on how to use Dyspnea scale Provide education and explanation on how to use Dyspnea scale Provide education and explanation on how to use Dyspnea scale Provide education and explanation on how to use Dyspnea scale    Expected Outcomes Short Term: Able to use Dyspnea scale daily in rehab to express subjective sense of shortness of breath during exertion;Long Term: Able to use Dyspnea scale to guide intensity level when exercising independently Short Term: Able to use Dyspnea scale daily  in rehab to express subjective sense of shortness of breath during exertion;Long Term: Able to use Dyspnea scale to guide intensity level when exercising independently Short Term: Able to use Dyspnea scale daily in rehab to express subjective sense of shortness of breath during exertion;Long Term: Able to use Dyspnea scale to guide  intensity level when exercising independently Short Term: Able to use Dyspnea scale daily in rehab to express subjective sense of shortness of breath during exertion;Long Term: Able to use Dyspnea scale to guide intensity level when exercising independently    Knowledge and understanding of Target Heart Rate Range (THRR) Yes Yes Yes Yes    Intervention Provide education and explanation of THRR including how the numbers were predicted and where they are located for reference Provide education and explanation of THRR including how the numbers were predicted and where they are located for reference Provide education and explanation of THRR including how the numbers were predicted and where they are located for reference Provide education and explanation of THRR including how the numbers were predicted and where they are located for reference    Expected Outcomes Short Term: Able to state/look up THRR;Short Term: Able to use daily as guideline for intensity in rehab;Long Term: Able to use THRR to govern intensity when exercising independently Short Term: Able to state/look up THRR;Short Term: Able to use daily as guideline for intensity in rehab;Long Term: Able to use THRR to govern intensity when exercising independently Short Term: Able to state/look up THRR;Short Term: Able to use daily as guideline for intensity in rehab;Long Term: Able to use THRR to govern intensity when exercising independently Short Term: Able to state/look up THRR;Short Term: Able to use daily as guideline for intensity in rehab;Long Term: Able to use THRR to govern intensity when exercising independently    Understanding of Exercise Prescription Yes Yes Yes Yes    Intervention Provide education, explanation, and written materials on patient's individual exercise prescription Provide education, explanation, and written materials on patient's individual exercise prescription Provide education, explanation, and written materials on patient's  individual exercise prescription Provide education, explanation, and written materials on patient's individual exercise prescription    Expected Outcomes Short Term: Able to explain program exercise prescription;Long Term: Able to explain home exercise prescription to exercise independently Short Term: Able to explain program exercise prescription;Long Term: Able to explain home exercise prescription to exercise independently Short Term: Able to explain program exercise prescription;Long Term: Able to explain home exercise prescription to exercise independently Short Term: Able to explain program exercise prescription;Long Term: Able to explain home exercise prescription to exercise independently             Exercise Goals Re-Evaluation :  Exercise Goals Re-Evaluation     Row Name 01/18/23 0919 02/12/23 1217 03/13/23 1601         Exercise Goal Re-Evaluation   Exercise Goals Review Increase Physical Activity;Able to understand and use Dyspnea scale;Understanding of Exercise Prescription;Increase Strength and Stamina;Knowledge and understanding of Target Heart Rate Range (THRR);Able to understand and use rate of perceived exertion (RPE) scale Increase Physical Activity;Able to understand and use Dyspnea scale;Understanding of Exercise Prescription;Increase Strength and Stamina;Knowledge and understanding of Target Heart Rate Range (THRR);Able to understand and use rate of perceived exertion (RPE) scale Increase Physical Activity;Able to understand and use Dyspnea scale;Understanding of Exercise Prescription;Increase Strength and Stamina;Knowledge and understanding of Target Heart Rate Range (THRR);Able to understand and use rate of perceived exertion (RPE) scale     Comments Pt is  scheduled to begin exercise 8/13. Will progress as tolerated. Hadasah has completed 1 exercise session. She exercises for 15 min on the upright elliptical and treadmill. Audre averages 4.3 METs at level 1 and speed 1 and  2.4 METs on the treadmill. She performs the warmup and cooldown standing without limitations. Taheerah has missed several sessions due to her surgery. She plans to return next month at a earlier class time that will benefit her. Will continue to monitor and progress as able. Odean has completed 6 exercise session. She exercises for 15 min on the upright elliptical and treadmill. Tresa averages 4.0 METs at level 2 and speed 1 and 3.26 METs on the treadmill. She performs the warmup and cooldown standing without limitations. She has increased her workload for the upright elliptical as METs have remained relatively the same. Her workload has increased and decreased on the treadmill. Candelaria does not feel a difference since starting rehab. Will continue to monitor and progress as able.     Expected Outcomes Through exercise at rehab and home, the patient will decrease shortness of breath with daily activities and feel confident in carrying out an exercise regimen at home Through exercise at rehab and home, the patient will decrease shortness of breath with daily activities and feel confident in carrying out an exercise regimen at home Through exercise at rehab and home, the patient will decrease shortness of breath with daily activities and feel confident in carrying out an exercise regimen at home              Discharge Exercise Prescription (Final Exercise Prescription Changes):  Exercise Prescription Changes - 03/20/23 0900       Response to Exercise   Blood Pressure (Admit) 130/88    Blood Pressure (Exercise) 138/78    Blood Pressure (Exit) 124/76    Heart Rate (Admit) 97 bpm    Heart Rate (Exercise) 111 bpm    Heart Rate (Exit) 92 bpm    Oxygen Saturation (Admit) 97 %    Oxygen Saturation (Exercise) 97 %    Oxygen Saturation (Exit) 97 %    Rating of Perceived Exertion (Exercise) 13    Perceived Dyspnea (Exercise) 1    Duration Continue with 30 min of aerobic exercise without signs/symptoms of  physical distress.    Intensity THRR unchanged      Resistance Training   Training Prescription Yes    Weight black bands    Reps 10-15    Time 10 Minutes      Treadmill   MPH 2.6    Grade 1.5    Minutes 15    METs 3.2      Elliptical   Level 2    Speed 1    Minutes 15    METs 3.9             Nutrition:  Target Goals: Understanding of nutrition guidelines, daily intake of sodium 1500mg , cholesterol 200mg , calories 30% from fat and 7% or less from saturated fats, daily to have 5 or more servings of fruits and vegetables.  Biometrics:  Pre Biometrics - 01/12/23 0909       Pre Biometrics   Grip Strength 20 kg              Nutrition Therapy Plan and Nutrition Goals:  Nutrition Therapy & Goals - 03/01/23 1542       Nutrition Therapy   Diet General Healthy Diet      Personal Nutrition Goals   Nutrition  Goal Patient to improve diet quality by using the plate method as a guide for meal planning to include lean protein/plant protein, fruits, vegetables, whole grains, nonfat dairy as part of a well-balanced diet.   goal in progress.   Personal Goal #2 Patient to identify strategies for weight loss of 0.5-2.0# per week.   goal in progress.   Comments Goals in progress. Lluvia has only completed three pulmonary rehab sessions due to other medical procedures/appointments. She did start Moujaro in April 2024 at 198#; she is down 3.3# since starting with our program. Carma has started to transition to more plant based eating to aid with inflammation and lose weight. She reports struggling with weight for most of adulthood. She does report improved fasting/am blood sugars <100. We discussed multiple strategies for weight loss including the plate method as a guide for meal planning/portion sizes, protein supplements, benefits of high fiber/high protein intake. Minie will benefit from participation in pulmonary rehab for nutrition, exercise, and lifestyle modification.       Intervention Plan   Intervention Prescribe, educate and counsel regarding individualized specific dietary modifications aiming towards targeted core components such as weight, hypertension, lipid management, diabetes, heart failure and other comorbidities.;Nutrition handout(s) given to patient.    Expected Outcomes Short Term Goal: Understand basic principles of dietary content, such as calories, fat, sodium, cholesterol and nutrients.;Long Term Goal: Adherence to prescribed nutrition plan.             Nutrition Assessments:  MEDIFICTS Score Key: >=70 Need to make dietary changes  40-70 Heart Healthy Diet <= 40 Therapeutic Level Cholesterol Diet   Picture Your Plate Scores: <78 Unhealthy dietary pattern with much room for improvement. 41-50 Dietary pattern unlikely to meet recommendations for good health and room for improvement. 51-60 More healthful dietary pattern, with some room for improvement.  >60 Healthy dietary pattern, although there may be some specific behaviors that could be improved.    Nutrition Goals Re-Evaluation:  Nutrition Goals Re-Evaluation     Row Name 01/30/23 1423 03/01/23 1542           Goals   Current Weight 190 lb 7.6 oz (86.4 kg) 189 lb 6 oz (85.9 kg)      Comment A1c 6.6, history of elevated LFTS but WNL at this time no new labs; most recent labs A1c 6.6, history of elevated LFTS but WNL at this time      Expected Outcome Bernis is motivated to transition to more plant based eating to aid with inflammation and lose weight. She reports struggling with weight for most of adulthood. She did start Moujaro in April 2024 at 198#. She does report improved fasting/am blood sugars 100-120s. She does report eating out for lunch more often. Mckayle will benefit from participation in pulmonary rehab for nutrition, exercise, and lifestyle modification. Goals in progress. Kwan has only completed three pulmonary rehab sessions due to other medical  procedures/appointments. She did start Moujaro in April 2024 at 198#; she is down 3.3# since starting with our program. Janika has started to transition to more plant based eating to aid with inflammation and lose weight. She reports struggling with weight for most of adulthood. She does report improved fasting/am blood sugars <100. We discussed multiple strategies for weight loss including the plate method as a guide for meal planning/portion sizes, protein supplements, benefits of high fiber/high protein intake. Tachina will benefit from participation in pulmonary rehab for nutrition, exercise, and lifestyle modification.  Nutrition Goals Discharge (Final Nutrition Goals Re-Evaluation):  Nutrition Goals Re-Evaluation - 03/01/23 1542       Goals   Current Weight 189 lb 6 oz (85.9 kg)    Comment no new labs; most recent labs A1c 6.6, history of elevated LFTS but WNL at this time    Expected Outcome Goals in progress. Lyliana has only completed three pulmonary rehab sessions due to other medical procedures/appointments. She did start Moujaro in April 2024 at 198#; she is down 3.3# since starting with our program. Pascuala has started to transition to more plant based eating to aid with inflammation and lose weight. She reports struggling with weight for most of adulthood. She does report improved fasting/am blood sugars <100. We discussed multiple strategies for weight loss including the plate method as a guide for meal planning/portion sizes, protein supplements, benefits of high fiber/high protein intake. Allesandra will benefit from participation in pulmonary rehab for nutrition, exercise, and lifestyle modification.             Psychosocial: Target Goals: Acknowledge presence or absence of significant depression and/or stress, maximize coping skills, provide positive support system. Participant is able to verbalize types and ability to use techniques and skills needed for reducing  stress and depression.  Initial Review & Psychosocial Screening:  Initial Psych Review & Screening - 01/12/23 0922       Initial Review   Current issues with None Identified      Family Dynamics   Good Support System? Yes    Comments Wenda states she has good support from her spouse      Barriers   Psychosocial barriers to participate in program There are no identifiable barriers or psychosocial needs.             Quality of Life Scores:  Scores of 19 and below usually indicate a poorer quality of life in these areas.  A difference of  2-3 points is a clinically meaningful difference.  A difference of 2-3 points in the total score of the Quality of Life Index has been associated with significant improvement in overall quality of life, self-image, physical symptoms, and general health in studies assessing change in quality of life.  PHQ-9: Review Flowsheet       01/12/2023 12/15/2019 11/19/2018  Depression screen PHQ 2/9  Decreased Interest 0 3 0  Down, Depressed, Hopeless 0 2 0  PHQ - 2 Score 0 5 0  Altered sleeping 0 3 1  Tired, decreased energy 0 3 1  Change in appetite 0 3 0  Feeling bad or failure about yourself  0 0 0  Trouble concentrating 0 0 0  Moving slowly or fidgety/restless 0 1 0  Suicidal thoughts 0 0 0  PHQ-9 Score 0 15 2  Difficult doing work/chores - Not difficult at all -    Details           Interpretation of Total Score  Total Score Depression Severity:  1-4 = Minimal depression, 5-9 = Mild depression, 10-14 = Moderate depression, 15-19 = Moderately severe depression, 20-27 = Severe depression   Psychosocial Evaluation and Intervention:  Psychosocial Evaluation - 01/12/23 0926       Psychosocial Evaluation & Interventions   Comments Zian denies any psychosocial barriers or concerns    Expected Outcomes For Jahasia to participate in PR without any psychosocial barriers or concerns    Continue Psychosocial Services  No Follow up required  Psychosocial Re-Evaluation:  Psychosocial Re-Evaluation     Row Name 01/19/23 1457 02/12/23 1334 03/12/23 0924         Psychosocial Re-Evaluation   Current issues with None Identified None Identified None Identified     Comments No changes since Alexandr's orientation appointment. Gennette will start the program on 8/13/20242 Mccauley has only attended one session. She is scheduled to return to class on 9/10 due to medical procedure. No new psychosocial barriers or concers. Jeralene denies any psychosocial barriers or concerns. She visits the beach and exercises with her husband for stress relief.     Expected Outcomes For Amerah to participate in RP without any psychosocial barriers or concerns For Merrillyn to participate in RP without any psychosocial barriers or concerns For Tonishia to participate in PR without any psychosocial barriers or concerns     Interventions Encouraged to attend Pulmonary Rehabilitation for the exercise Encouraged to attend Pulmonary Rehabilitation for the exercise Encouraged to attend Pulmonary Rehabilitation for the exercise     Continue Psychosocial Services  No Follow up required No Follow up required No Follow up required              Psychosocial Discharge (Final Psychosocial Re-Evaluation):  Psychosocial Re-Evaluation - 03/12/23 0924       Psychosocial Re-Evaluation   Current issues with None Identified    Comments Lashaun denies any psychosocial barriers or concerns. She visits the beach and exercises with her husband for stress relief.    Expected Outcomes For Devyn to participate in PR without any psychosocial barriers or concerns    Interventions Encouraged to attend Pulmonary Rehabilitation for the exercise    Continue Psychosocial Services  No Follow up required             Education: Education Goals: Education classes will be provided on a weekly basis, covering required topics. Participant will state understanding/return  demonstration of topics presented.  Learning Barriers/Preferences:  Learning Barriers/Preferences - 01/12/23 1002       Learning Barriers/Preferences   Learning Barriers None    Learning Preferences Individual Instruction;Written Material;Verbal Instruction             Education Topics: Know Your Numbers Group instruction that is supported by a PowerPoint presentation. Instructor discusses importance of knowing and understanding resting, exercise, and post-exercise oxygen saturation, heart rate, and blood pressure. Oxygen saturation, heart rate, blood pressure, rating of perceived exertion, and dyspnea are reviewed along with a normal range for these values.    Exercise for the Pulmonary Patient Group instruction that is supported by a PowerPoint presentation. Instructor discusses benefits of exercise, core components of exercise, frequency, duration, and intensity of an exercise routine, importance of utilizing pulse oximetry during exercise, safety while exercising, and options of places to exercise outside of rehab.  Flowsheet Row PULMONARY REHAB OTHER RESPIRATORY from 03/15/2023 in [facility]  Date 03/15/23  Educator eP  Instruction Review Code 1- Verbalizes Understanding       MET Level  Group instruction provided by PowerPoint, verbal discussion, and written material to support subject matter. Instructor reviews what METs are and how to increase METs.    Pulmonary Medications Verbally interactive group education provided by instructor with focus on inhaled medications and proper administration. Flowsheet Row PULMONARY REHAB OTHER RESPIRATORY from 03/08/2023 in [facility]  Date 03/08/23  Educator RT  Instruction Review Code 1- Verbalizes Understanding  Anatomy and Physiology of the Respiratory System Group instruction provided by PowerPoint,  verbal discussion, and written material to support subject matter. Instructor reviews respiratory cycle and anatomical components of the respiratory system and their functions. Instructor also reviews differences in obstructive and restrictive respiratory diseases with examples of each.  Flowsheet Row PULMONARY REHAB OTHER RESPIRATORY from 03/01/2023 in [facility]  Date 03/01/23  Educator RT  Instruction Review Code 1- Verbalizes Understanding       Oxygen Safety Group instruction provided by PowerPoint, verbal discussion, and written material to support subject matter. There is an overview of "What is Oxygen" and "Why do we need it".  Instructor also reviews how to create a safe environment for oxygen use, the importance of using oxygen as prescribed, and the risks of noncompliance. There is a brief discussion on traveling with oxygen and resources the patient may utilize.   Oxygen Use Group instruction provided by PowerPoint, verbal discussion, and written material to discuss how supplemental oxygen is prescribed and different types of oxygen supply systems. Resources for more information are provided.    Breathing Techniques Group instruction that is supported by demonstration and informational handouts. Instructor discusses the benefits of pursed lip and diaphragmatic breathing and detailed demonstration on how to perform both.     Risk Factor Reduction Group instruction that is supported by a PowerPoint presentation. Instructor discusses the definition of a risk factor, different risk factors for pulmonary disease, and how the heart and lungs work together.   Pulmonary Diseases Group instruction provided by PowerPoint, verbal discussion, and written material to support subject matter. Instructor gives an overview of the different type of pulmonary diseases. There is also a discussion on risk factors and symptoms as well as ways  to manage the diseases.   Stress and Energy Conservation Group instruction provided by PowerPoint, verbal discussion, and written material to support subject matter. Instructor gives an overview of stress and the impact it can have on the body. Instructor also reviews ways to reduce stress. There is also a discussion on energy conservation and ways to conserve energy throughout the day.   Warning Signs and Symptoms Group instruction provided by PowerPoint, verbal discussion, and written material to support subject matter. Instructor reviews warning signs and symptoms of stroke, heart attack, cold and flu. Instructor also reviews ways to prevent the spread of infection.   Other Education Group or individual verbal, written, or video instructions that support the educational goals of the pulmonary rehab program.    Knowledge Questionnaire Score:  Knowledge Questionnaire Score - 01/12/23 1002       Knowledge Questionnaire Score   Pre Score 17/18             Core Components/Risk Factors/Patient Goals at Admission:  Personal Goals and Risk Factors at Admission - 01/12/23 0919       Core Components/Risk Factors/Patient Goals on Admission    Weight Management Weight Loss;Yes    Intervention Weight Management: Develop a combined nutrition and exercise program designed to reach desired caloric intake, while maintaining appropriate intake of nutrient and fiber, sodium and fats, and appropriate energy expenditure required for the weight goal.;Weight Management: Provide education and appropriate resources to help participant work on and attain dietary goals.;Weight Management/Obesity: Establish reasonable short term and long term weight goals.;Obesity: Provide education and appropriate resources to help participant work on and attain dietary goals.    Admit Weight 192 lb 9.6 oz (87.4 kg)  Improve shortness of breath with ADL's Yes    Intervention Provide education, individualized exercise  plan and daily activity instruction to help decrease symptoms of SOB with activities of daily living.    Expected Outcomes Short Term: Improve cardiorespiratory fitness to achieve a reduction of symptoms when performing ADLs;Long Term: Be able to perform more ADLs without symptoms or delay the onset of symptoms    Increase knowledge of respiratory medications and ability to use respiratory devices properly  Yes    Intervention Provide education and demonstration as needed of appropriate use of medications, inhalers, and oxygen therapy.    Expected Outcomes Short Term: Achieves understanding of medications use. Understands that oxygen is a medication prescribed by physician. Demonstrates appropriate use of inhaler and oxygen therapy.;Long Term: Maintain appropriate use of medications, inhalers, and oxygen therapy.             Core Components/Risk Factors/Patient Goals Review:   Goals and Risk Factor Review     Row Name 01/22/23 1551 02/12/23 1335 03/12/23 0928         Core Components/Risk Factors/Patient Goals Review   Personal Goals Review Improve shortness of breath with ADL's;Develop more efficient breathing techniques such as purse lipped breathing and diaphragmatic breathing and practicing self-pacing with activity.;Increase knowledge of respiratory medications and ability to use respiratory devices properly.;Weight Management/Obesity Weight Management/Obesity;Improve shortness of breath with ADL's;Develop more efficient breathing techniques such as purse lipped breathing and diaphragmatic breathing and practicing self-pacing with activity.;Increase knowledge of respiratory medications and ability to use respiratory devices properly. Weight Management/Obesity;Improve shortness of breath with ADL's;Develop more efficient breathing techniques such as purse lipped breathing and diaphragmatic breathing and practicing self-pacing with activity.;Increase knowledge of respiratory medications and  ability to use respiratory devices properly.     Review Unable to assess, Hildur has not yet started the program but is scheduled to start on 01/30/2023 Azmariah has attended one session so far. She is scheduled to return to class on 9/10. Goals are in progress. Goal in progress for weight loss. Christinna has not had success yet in losing weight but is still working with our dietician. She hopes that exercising will help. She has just started her journey in class and has completed 5 sessions so far. Goal met on developing more efficient breathing techniques such as purse lipped breathing and diaphragmatic breathing; and practicing self-pacing with activity. Sulamita can initiate PLB and can self-pace herself while on the elliptical and treadmill. Goal in progress on improving her shortness of breath with ADLs. She has increased both her workload and METs while maintaining her oxygen saturation >88% on room air. Goal met for increasing her knowledge of respiratory medications and ability to use respiratory devices properly. She has correctly demonstrated and voiced when to use her medications with our respiratory therapist. Edeline will continue to benefit from PR for nutrition, education, exercise, and lifestyle modification.     Expected Outcomes See admission goals See admission goals For Yozelin to lose weight and improve her SOB with ADLs              Core Components/Risk Factors/Patient Goals at Discharge (Final Review):   Goals and Risk Factor Review - 03/12/23 0928       Core Components/Risk Factors/Patient Goals Review   Personal Goals Review Weight Management/Obesity;Improve shortness of breath with ADL's;Develop more efficient breathing techniques such as purse lipped breathing and diaphragmatic breathing and practicing self-pacing with activity.;Increase knowledge of respiratory medications and ability to use respiratory devices properly.  Review Goal in progress for weight loss. Clydie has not  had success yet in losing weight but is still working with our dietician. She hopes that exercising will help. She has just started her journey in class and has completed 5 sessions so far. Goal met on developing more efficient breathing techniques such as purse lipped breathing and diaphragmatic breathing; and practicing self-pacing with activity. Jaretzi can initiate PLB and can self-pace herself while on the elliptical and treadmill. Goal in progress on improving her shortness of breath with ADLs. She has increased both her workload and METs while maintaining her oxygen saturation >88% on room air. Goal met for increasing her knowledge of respiratory medications and ability to use respiratory devices properly. She has correctly demonstrated and voiced when to use her medications with our respiratory therapist. Tranesha will continue to benefit from PR for nutrition, education, exercise, and lifestyle modification.    Expected Outcomes For Danyalle to lose weight and improve her SOB with ADLs             ITP Comments: Pt is making expected progress toward Pulmonary Rehab goals after completing 8 sessions. Recommend continued exercise, life style modification, education, and utilization of breathing techniques to increase stamina and strength, while also decreasing shortness of breath with exertion.  Dr. Mechele Collin is Medical Director for Pulmonary Rehab at [facility].

## 2023-03-21 NOTE — Progress Notes (Signed)
Name: [name]  MRN/ DOB: [id number], 1965/06/29   Age/ Sex: 57 y.o., female    PCP: Fatima Sanger, FNP   Reason for Endocrinology Evaluation: Type 2 Diabetes Mellitus     Date of Initial Endocrinology Visit: 05/05/2022    PATIENT IDENTIFIER: [name] is a 57 y.o. female with a past medical history of OSA, DM and HTN, Sarcoidosis (Dx 2024). The patient presented for initial endocrinology clinic visit on 05/05/2022 for consultative assistance with her diabetes management.    HPI: [name]    Diagnosed with DM 03/2021 Prior Medications tried/Intolerance: Metformin started 01/2022 and Jardiance 03/2022 but this [name] discontinued due to recurrent genital infections 04/2022. Ozempic -nausea  Hemoglobin A1c has ranged from 5.7% in 2018, peaking at 6.5% in 03/2022.  On her initial visit to our clinic she had an A1c 6.0%, she [name] on jardiance and Metformin, we stopped Jardiance due to recurrent genital infections    Started Mounjaro/2024   THYROID HISTORY: Patient [name] noted with multinodular goiter on thyroid ultrasound 04/2021.  She is S/P FNA of the right mid 2.8 cm nodule with atypia of undetermined significance (Bethesda category III). Per pt Afirma [name] benign    Due to symptoms of dysphagia, barium swallow [name] performed which [name] normal 05/2022    SUBJECTIVE:   During the last visit (09/19/2022): A1c 6.0%      Today (03/21/23): [name] is here for a follow up on diabetes management and MNG .  She checks her blood sugars 1 times daily. The patient has not had hypoglycemic episodes since the last clinic visit.  She is undergoing pulmonary rehab for pulmonary sarcoidosis, she is on methotrexate and folic acid, she felt with fatigue with stepping down to prednisone 5 mg, she is on corticotropin gel  IM Q  72 hrs  She [name] seen by GYN 03/06/2023 for dysuria She follows with neurology for mild cognitive impairment   Sob with exertion  She continues  with fatigue    Chronic nausea has improved  Denies vomiting  Denies constipation or diarrhea  Denies local neck swelling  Denies palpitations  Denies tremors  Has tingling and numbness of arms and legs , she has a follow up with neurology    HOME DIABETES REGIMEN: Metformin 500 mg 2 tabs daily with supper  Mounjaro 5 mg weekly     Statin: no ACE-I/ARB: yes Prior Diabetic Education: no  METER DOWNLOAD SUMMARY: 98-290 mg/dL    DIABETIC COMPLICATIONS: Microvascular complications:   Denies: CKD, retinopathy, neuropathy  Last eye exam: Completed 11/2021  Macrovascular complications:   Denies: CAD, PVD, CVA   PAST HISTORY: Past Medical History:  Past Medical History:  Diagnosis Date   Back pain    Constipation    Contact dermatitis 02/11/2021   Elevated LFTs 12/12/2018   Fatty liver    Fen-phen history 11/24/2018   Generalized anxiety disorder 09/25/2022   Hypercholesteremia    Hypertension    Intestinal bacterial overgrowth    Joint pain    Kidney stones    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   Lumbar pain 07/12/2021   Lymphadenopathy 06/13/2022   Migraines    Mild cognitive impairment 02/02/2023   Mixed hyperlipidemia 12/12/2018   Morbid obesity 11/24/2018   Myalgia, unspecified site 06/21/2021   Nausea in adult    Obstructive sleep apnea    mild, no CPAP   Other allergic rhinitis 04/04/2022   Pain in thoracic spine 07/18/2021  Sarcoidosis    Sleep disturbance 10/08/2019   Splenic lesion 05/20/2022   Subclinical hypothyroidism 09/2009   Type 2 diabetes mellitus without complication, without long-term current use of insulin 05/05/2022   Past Surgical History:  Past Surgical History:  Procedure Laterality Date   BLADDER SUSPENSION N/A 06/05/2022   Procedure: TRANSVAGINAL TAPE (TVT) PROCEDURE;  Surgeon: Marguerita Beards, MD;  Location: [facility];  Service: Gynecology;  Laterality: N/A;  total time requested is 1 hour   COLONOSCOPY      CYSTOSCOPY N/A 06/05/2022   Procedure: CYSTOSCOPY;  Surgeon: Marguerita Beards, MD;  Location: [facility];  Service: Gynecology;  Laterality: N/A;   LITHOTRIPSY     LYMPH NODE DISSECTION     NOVASURE ABLATION  12/18/2006   RADIOACTIVE SEED GUIDED EXCISIONAL BREAST BIOPSY Right 05/30/2021   Procedure: RADIOACTIVE SEED GUIDED EXCISIONAL RIGHT BREAST BIOPSY;  Surgeon: Emelia Loron, MD;  Location: [facility];  Service: General;  Laterality: Right;   ROTATOR CUFF REPAIR Right 06/20/2007    Social History:  reports that she has never smoked. She has never been exposed to tobacco smoke. She has never used smokeless tobacco. She reports that she does not drink alcohol and does not use drugs. Family History:  Family History  Problem Relation Age of Onset   Diabetes Mother    Hyperlipidemia Mother    Thyroid disease Mother    Anxiety disorder Mother    Obesity Mother    Aortic stenosis Mother    Arthritis-Osteo Sister    Colon cancer Paternal Grandmother    Heart disease Paternal Grandfather    Pancreatic cancer Neg Hx    Stomach cancer Neg Hx    Liver disease Neg Hx    Esophageal cancer Neg Hx      HOME MEDICATIONS: Allergies as of 03/21/2023       Reactions   Tizanidine Hcl Other (See Comments)   Cant move Cant move   Clarithromycin Nausea Only   Severe stomach cramps Severe stomach cramps        Medication List        Accurate as of March 21, 2023 10:06 AM. If you have any questions, ask your nurse or doctor.          STOP taking these medications    cephALEXin 500 MG capsule Commonly known as: Keflex Stopped by: Johnney Ou Karcyn Menn       TAKE these medications    Acthar Gel 40 UNIT/0.5ML Auij Generic drug: Corticotropin Gel Inject 40 mg into the skin every 3 (three) days.   Breztri Aerosphere 160-9-4.8 MCG/ACT Aero Generic drug: Budeson-Glycopyrrol-Formoterol Inhale 2 puffs into the lungs in the morning and  at bedtime.   clobetasol ointment 0.05 % Commonly known as: TEMOVATE Apply 1 Application topically 2 (two) times daily. Use for 2 weeks at a time for a flare.  You may use the cream twice a week at bedtime for maintenance dosing.   Eysuvis 0.25 % Susp Generic drug: Loteprednol Etabonate Apply to eye.   folic acid 1 MG tablet Commonly known as: FOLVITE Take 2 tablets (2 mg total) by mouth daily.   metFORMIN 500 MG 24 hr tablet Commonly known as: GLUCOPHAGE-XR Take 2 tablets (1,000 mg total) by mouth daily before supper.   methotrexate 2.5 MG tablet Commonly known as: RHEUMATREX Take 8 tablets (20 mg total) by mouth once a week.   omeprazole 20 MG capsule Commonly known as: PRILOSEC Take 1 capsule (  20 mg total) by mouth daily.   ondansetron 4 MG tablet Commonly known as: ZOFRAN Take 1 tablet (4 mg total) by mouth daily at 2 PM.   predniSONE 5 MG tablet Commonly known as: DELTASONE Take 2 tablets (10 mg total) by mouth daily with breakfast.   tirzepatide 5 MG/0.5ML Pen Commonly known as: MOUNJARO Inject 5 mg into the skin once a week.   valACYclovir 1000 MG tablet Commonly known as: VALTREX Take 2 Tablets By Mouth Every 12 Hours for 24 hours as needed for an outbreak.   Vibegron 75 MG Tabs Take 1 tablet (75 mg total) by mouth daily.         ALLERGIES: Allergies  Allergen Reactions   Tizanidine Hcl Other (See Comments)    Cant move Cant move   Clarithromycin Nausea Only    Severe stomach cramps Severe stomach cramps     REVIEW OF SYSTEMS: A comprehensive ROS [name] conducted with the patient and is negative except as per HPI    OBJECTIVE:   VITAL SIGNS: BP 126/88 (BP Location: Left Arm, Patient Position: Sitting, Cuff Size: Large)   Pulse 85   Ht 5\' 1"  (1.549 m)   Wt 187 lb (84.8 kg)   SpO2 96%   BMI 35.33 kg/m    PHYSICAL EXAM:  General: Pt appears well and is in NAD  Neck: General: Supple without adenopathy or carotid bruits. Thyroid: Thyroid  size normal.  Right  nodule appreciated.   Lungs: Clear with good BS bilat with no rales, rhonchi, or wheezes  Heart: RRR   Abdomen:  soft, nontender  Extremities:  Lower extremities - No pretibial edema.   Neuro: MS is good with appropriate affect, pt is alert and Ox3   Dm Foot Exam 03/21/2023  The skin of the feet is intact without sores or ulcerations. The pedal pulses are 2+ on right and 2+ on left. The sensation is intact to a screening 5.07, 10 gram monofilament bilaterally    DATA REVIEWED:  Lab Results  Component Value Date   HGBA1C 6.6 (A) 09/19/2022   HGBA1C 6.0 (A) 05/05/2022   HGBA1C 6.3 (H) 12/15/2019    Latest Reference Range & Units 02/01/23 10:22  Sodium 134 - 144 mmol/L 143  Potassium 3.5 - 5.2 mmol/L 5.4 (H)  Chloride 96 - 106 mmol/L 103  CO2 20 - 29 mmol/L 24  Glucose 70 - 99 mg/dL 97  BUN 6 - 24 mg/dL 9  Creatinine 3.08 - 6.57 mg/dL 8.46  Calcium 8.7 - 96.2 mg/dL 95.2  BUN/Creatinine Ratio 9 - 23  10  eGFR >59 mL/min/1.73 72  Alkaline Phosphatase 44 - 121 IU/L 90  Albumin 3.8 - 4.9 g/dL 4.8  AST 0 - 40 IU/L 24  ALT 0 - 32 IU/L 29  Total Protein 6.0 - 8.5 g/dL 7.2  Total Bilirubin 0.0 - 1.2 mg/dL 0.3   8/41/3244 TSH 0.10 uIU/mL   Thyroid Ultrasound 01/20/2022 Estimated total number of nodules >/= 1 cm: 2   Number of spongiform nodules >/=  2 cm not described below (TR1): 0   Number of mixed cystic and solid nodules >/= 1.5 cm not described below (TR2): 0   _________________________________________________________   Nodule labeled 1, inferior right thyroid, 2.8 cm. Nodule is unchanged and has undergone prior biopsy. Assuming benign result, no further specific follow-up would be indicated.   Nodule labeled 2 inferior left thyroid, 1.9 cm. This has TR 3 characteristics and meets criteria for surveillance.   No  adenopathy   IMPRESSION: Left inferior thyroid nodule (labeled 2, 1.9 cm, TR 3) meets criteria for surveillance, as designated by  the newly established ACR TI-RADS criteria. Surveillance ultrasound study recommended to be performed annually up to 5 years.   FNA right mid 07/07/2021 Clinical History: Rt. Mid thyroid nodule 2.5 x 2.8 x 2.1 cm  Specimen Submitted:  A. THYROID, RIGHT LOBE, FINE NEEDLE ASPIRATION:    FINAL MICROSCOPIC DIAGNOSIS:  - Atypia of undetermined significance (Bethesda category III)    ASSESSMENT / PLAN / RECOMMENDATIONS:   1) Type 2 Diabetes Mellitus, Optimally controlled, Without complications - Most recent A1c of 5.7%. Goal A1c < 7.0 %.    -A1c remains at goal  -She is already on 5 mg of prednisone for sarcoidosis, she [name] recently started on corticotropin injections every 72 hours which has caused hyperglycemia as high as 290 Mg/DL, we discussed providing her with a prescription of NovoLog to be used based on correction scale as below For hyperglycemia -I will increase her Mounjaro -She did develop nausea with Ozempic -She developed recurrent genital infections with Jardiance -Dexcom prescription [name] sent, and a sample provided  MEDICATIONS: Take metformin 500 mg XR 2 tabs before supper Increase  Mounjaro 7.5 mg weekly  Start correction factor: NovoLog (BG -120/35) TIDQAC and QHS  EDUCATION / INSTRUCTIONS: BG monitoring instructions: Patient is instructed to check her blood sugars 1 times a day, fasting. Call [facility] if: BG persistently < 70  I reviewed the Rule of 15 for the treatment of hypoglycemia in detail with the patient. Literature supplied.   2) Diabetic complications:  Eye: Does not have known diabetic retinopathy.  Neuro/ Feet: Does not have known diabetic peripheral neuropathy. Renal: Patient does not have known baseline CKD.    3) MNG   - S/P FNA of the 2.8 cm right nodule with Atypia . Afirma benign per pt ( record not available )  -Repeating thyroid ultrasound showed stability 01/2022 -TFTs have been normal -Will proceed with a repeat  ultrasound for this year     Follow-up in 6 months   Signed electronically by: Lyndle Herrlich, MD  [facility] Endocrinology  [facility] [address] [place] [place], [place] [address] Phone: [phone] FAX: [phone]   CC: Fatima Sanger, FNP [address] [place] [address] Phone: [phone]  Fax: [phone]    Return to Endocrinology clinic as below: Future Appointments  Date Time Provider Department Center  03/21/2023 10:10 AM Murlean Seelye, Konrad Dolores, MD [facility] None  03/22/2023  8:15 AM [facility] None  03/22/2023 10:45 AM Patton Salles, MD [facility] None  03/27/2023  8:15 AM [facility] None  03/29/2023  8:15 AM [facility] None  04/03/2023  9:00 AM Marcos Eke, PA-C [facility] None  04/03/2023 10:15 AM [facility] None  04/05/2023  8:15 AM [facility] None  04/10/2023  8:15 AM [facility]GR[facility] None  04/12/2023  8:15 AM [facility] None  04/17/2023  8:15 AM [facility] None  04/19/2023  8:15 AM [facility] None  04/24/2023  8:15 AM [facility] None  04/26/2023  8:15 AM [facility] None  05/01/2023  8:15 AM [facility] None  05/03/2023  8:15 AM [facility] None  05/03/2023  9:30 AM Amundson Shirley Friar, MD  [facility] None  05/08/2023  8:15 AM [facility] None  05/10/2023  8:15 AM [facility] None  05/15/2023  8:15 AM [facility] None  05/21/2023  8:45 AM Luciano Cutter, MD [facility]  05/22/2023  8:15 AM [facility] None  08/14/2023  9:20 AM Marguerita Beards, MD [facility]

## 2023-03-21 NOTE — Patient Instructions (Signed)
Increase Mounjaro 7.5 mg weekly  Continue Metformin 500 mg , 2 tablets before Supper  Novolog correctional insulin: Use the scale below to help guide you before each meal and bedtime   Blood sugar before meal Number of units to inject  Less than 155 0 unit  156 -  190 1 units  191 -  225 2 units  226 -  260 3 units  261 -  295 4 units  296 -  330 5 units  331 -  365 6 units      HOW TO TREAT LOW BLOOD SUGARS (Blood sugar LESS THAN 70 MG/DL) Please follow the RULE OF 15 for the treatment of hypoglycemia treatment (when your (blood sugars are less than 70 mg/dL)   STEP 1: Take 15 grams of carbohydrates when your blood sugar is low, which includes:  3-4 GLUCOSE TABS  OR 3-4 OZ OF JUICE OR REGULAR SODA OR ONE TUBE OF GLUCOSE GEL    STEP 2: RECHECK blood sugar in 15 MINUTES STEP 3: If your blood sugar is still low at the 15 minute recheck --> then, go back to STEP 1 and treat AGAIN with another 15 grams of carbohydrates.

## 2023-03-22 ENCOUNTER — Encounter (HOSPITAL_COMMUNITY): Payer: BC Managed Care – PPO

## 2023-03-22 ENCOUNTER — Encounter: Payer: Self-pay | Admitting: Obstetrics and Gynecology

## 2023-03-22 ENCOUNTER — Ambulatory Visit: Payer: BC Managed Care – PPO | Admitting: Obstetrics and Gynecology

## 2023-03-22 ENCOUNTER — Encounter (HOSPITAL_COMMUNITY)
Admission: RE | Admit: 2023-03-22 | Discharge: 2023-03-22 | Disposition: A | Discharge status: Home / Self Care | Payer: BC Managed Care – PPO | Source: Ambulatory Visit | Attending: Pulmonary Disease | Admitting: Pulmonary Disease

## 2023-03-22 ENCOUNTER — Telehealth: Payer: Self-pay | Admitting: Obstetrics and Gynecology

## 2023-03-22 VITALS — BP 130/86 | HR 88 | Ht 61.0 in | Wt 187.0 lb

## 2023-03-22 DIAGNOSIS — Z13820 Encounter for screening for osteoporosis: Secondary | ICD-10-CM

## 2023-03-22 DIAGNOSIS — D869 Sarcoidosis, unspecified: Secondary | ICD-10-CM | POA: Diagnosis not present

## 2023-03-22 DIAGNOSIS — L929 Granulomatous disorder of the skin and subcutaneous tissue, unspecified: Secondary | ICD-10-CM | POA: Diagnosis not present

## 2023-03-22 NOTE — Telephone Encounter (Signed)
Gave to BS to sign for fax

## 2023-03-22 NOTE — Telephone Encounter (Signed)
Please assist in making appointment and signing an order for bone density at [facility] in December when patient has her mammogram done.  She takes medication that puts her at risk of osteoporosis.

## 2023-03-22 NOTE — Telephone Encounter (Signed)
Routing to [employer].

## 2023-03-22 NOTE — Progress Notes (Signed)
Daily Session Note  Patient Details  Name: [name] MRN: [id number] Date of Birth: 02/08/1966 Referring Provider:   Doristine Devoid Pulmonary Rehab Walk Test from 01/12/2023 in [facility]  Referring Provider [name]       Encounter Date: 03/22/2023  Check In:  Session Check In - 03/22/23 4098       Check-In   Supervising physician immediately available to respond to emergencies [facility] MD immediately available    Physician(s) Elizabeth Sauer, NP    Location [facility]    Staff Present Essie Hart, RN, BSN;Randi Idelle Crouch BS, ACSM-CEP, Exercise Physiologist;Kaylee Earlene Plater, MS, ACSM-CEP, Exercise Physiologist;Jadalee Westcott Katrinka Blazing, RT    Virtual Visit No    Medication changes reported     No    Tobacco Cessation No Change    Warm-up and Cool-down Performed as group-led instruction    Resistance Training Performed Yes    VAD Patient? No    PAD/SET Patient? No      Pain Assessment   Currently in Pain? No/denies             Capillary Blood Glucose: Results for orders placed or performed in visit on 03/21/23 (from the past 24 hour(s))  POCT glycosylated hemoglobin (Hb A1C)     Status: Abnormal   Collection Time: 03/21/23 10:08 AM  Result Value Ref Range   Hemoglobin A1C 5.7 (A) 4.0 - 5.6 %   HbA1c POC (<> result, manual entry)     HbA1c, POC (prediabetic range)     HbA1c, POC (controlled diabetic range)    Microalbumin / creatinine urine ratio     Status: None   Collection Time: 03/21/23 10:47 AM  Result Value Ref Range   Microalb, Ur <0.7 0.0 - 1.9 mg/dL   Creatinine,U 11.9 mg/dL   Microalb Creat Ratio 3.1 0.0 - 30.0 mg/g      Social History   Tobacco Use  Smoking Status Never   Passive exposure: Never  Smokeless Tobacco Never    Goals Met:  Proper associated with RPD/PD & O2 Sat Independence with exercise equipment Exercise tolerated well No report of concerns or symptoms today Strength  training completed today  Goals Unmet:  Not Applicable  Comments: Service time is from 0816 to 0940.    Dr. Mechele Collin is Medical Director for Pulmonary Rehab at [facility].

## 2023-03-27 ENCOUNTER — Encounter (HOSPITAL_COMMUNITY): Payer: BC Managed Care – PPO

## 2023-03-28 ENCOUNTER — Ambulatory Visit
Admission: RE | Admit: 2023-03-28 | Discharge: 2023-03-28 | Disposition: A | Discharge status: Home / Self Care | Payer: BC Managed Care – PPO | Source: Ambulatory Visit | Attending: Internal Medicine | Admitting: Internal Medicine

## 2023-03-28 DIAGNOSIS — E042 Nontoxic multinodular goiter: Secondary | ICD-10-CM

## 2023-03-29 ENCOUNTER — Encounter (HOSPITAL_COMMUNITY)
Admission: RE | Admit: 2023-03-29 | Discharge: 2023-03-29 | Disposition: A | Discharge status: Home / Self Care | Payer: BC Managed Care – PPO | Source: Ambulatory Visit | Attending: Pulmonary Disease | Admitting: Pulmonary Disease

## 2023-03-29 ENCOUNTER — Encounter (HOSPITAL_COMMUNITY): Payer: BC Managed Care – PPO

## 2023-03-29 DIAGNOSIS — D869 Sarcoidosis, unspecified: Secondary | ICD-10-CM

## 2023-03-29 NOTE — Progress Notes (Signed)
Daily Session Note  Patient Details  Name: KARLISSA ARON MRN: [id number] Date of Birth: Oct 31, 1965 Referring Provider:   Doristine Devoid Pulmonary Rehab Walk Test from 01/12/2023 in [facility]  Referring Provider [name]       Encounter Date: 03/29/2023  Check In:  Session Check In - 03/29/23 0825       Check-In   Supervising physician immediately available to respond to emergencies [facility] MD immediately available    Physician(s) Jari Favre, PA    Location [facility]    Staff Present Essie Hart, RN, BSN;Randi Idelle Crouch BS, ACSM-CEP, Exercise Physiologist;Jeronda Don Earlene Plater, MS, ACSM-CEP, Exercise Physiologist;Casey Hermine Messick Belarus, RD, LDN    Virtual Visit No    Medication changes reported     No    Fall or balance concerns reported    No    Tobacco Cessation No Change    Warm-up and Cool-down Performed as group-led instruction    Resistance Training Performed Yes    VAD Patient? No    PAD/SET Patient? No      Pain Assessment   Currently in Pain? No/denies             Capillary Blood Glucose: No results found for this or any previous visit (from the past 24 hour(s)).    Social History   Tobacco Use  Smoking Status Never   Passive exposure: Never  Smokeless Tobacco Never    Goals Met:  Proper associated with RPD/PD & O2 Sat Independence with exercise equipment Exercise tolerated well No report of concerns or symptoms today Strength training completed today  Goals Unmet:  Not Applicable  Comments: Service time is from 0815 to [phone].    Dr. Mechele Collin is Medical Director for Pulmonary Rehab at [facility].

## 2023-03-30 ENCOUNTER — Encounter (HOSPITAL_BASED_OUTPATIENT_CLINIC_OR_DEPARTMENT_OTHER): Payer: Self-pay | Admitting: Pulmonary Disease

## 2023-03-30 ENCOUNTER — Telehealth: Payer: Self-pay

## 2023-03-30 ENCOUNTER — Encounter: Payer: Self-pay | Admitting: Internal Medicine

## 2023-03-30 DIAGNOSIS — R591 Generalized enlarged lymph nodes: Secondary | ICD-10-CM

## 2023-03-30 NOTE — Telephone Encounter (Signed)
PA need for [facility]

## 2023-03-31 NOTE — Telephone Encounter (Signed)
Dr. [name], please see mychart message as well as attachment sent by pt.

## 2023-04-02 ENCOUNTER — Other Ambulatory Visit (HOSPITAL_COMMUNITY): Payer: Self-pay

## 2023-04-02 ENCOUNTER — Telehealth (HOSPITAL_BASED_OUTPATIENT_CLINIC_OR_DEPARTMENT_OTHER): Payer: Self-pay | Admitting: Pulmonary Disease

## 2023-04-02 NOTE — Progress Notes (Signed)
Assessment/Plan:   Mild Cognitive Impairment of Unclear Etiology, likely multifactorial. [name] is a very pleasant 57 y.o. RH female with a history of sarcoidosis on methotrexate, sleep apnea not on CPAP, depression, anxiety, hypertension hyperlipidemia chronic back pain, subclinical hypothyroidism, DM2*** presenting today in follow-up for evaluation of memory loss.  She had a recent neuropsychological evaluation yielding a diagnosis of mild cognitive impairment, of unclear etiology, likely multifactorial.  She has significant amount of fatigue during testing although she is able to participate on her ADLs independently.  Of note, the patient has a history of mild sleep apnea and multiple stressors which indeed could contribute to some of her symptoms of fatigue  As recalled, prior MRI of the brain did not suggest any sarcoid involvement of the CNS.   Recommendations:   Follow up as needed. No indication for antidementia medication as there is no i suspicion for neurological degenerative disease at this time. Recommend good control of cardiovascular risk factors Continue to control mood as per PCP    Subjective:   This patient is accompanied in the office by ***  who supplements the history. Previous records as well as any outside records available were reviewed prior to todays visit.   Patient was last seen on ***    Any changes in memory since last visit? " repeats oneself?  Endorsed Disoriented when walking into a room?  Patient denies except occasionally not remembering what patient came to the room for ***  Leaving objects in unusual places?  Patient denies   Wandering behavior?   denies   Any personality changes since last visit?   denies   Any worsening depression?: denies   Hallucinations or paranoia?  denies   Seizures?   denies    Any sleep changes? Sleeps well***. Does not sleep very well***.   Denies vivid dreams, REM behavior or sleepwalking   Sleep apnea?    denies ***  Any hygiene concerns?   denies   Independent of bathing and dressing?  Endorsed  Does the patient needs help with medications? Patient is in charge *** Who is in charge of the finances?  Patient is in charge   *** Any changes in appetite?  denies ***   Patient have trouble swallowing?  denies   Does the patient cook?  Any kitchen accidents such as leaving the stove on?   denies   Any headaches?    denies   Vision changes? denies Chronic pain?  denies   Ambulates with difficulty?    denies ***  Recent falls or head injuries?    denies      Unilateral weakness, numbness or tingling?   denies   Any tremors?  denies   Any anosmia?    denies   Any incontinence of urine?  denies   Any bowel dysfunction?  denies      Patient lives with   *** Does the patient drive?***  Neuropsych evaluation August 2024 Briefly, results suggested a primary impairment surrounding complex attention/concentration. Further relative weaknesses were exhibited across verbal fluency and both encoding (i.e., learning) and retrieval aspects of memory. Performance variability was exhibited across processing speed and executive functioning. The etiology for Ms. Bunyard's nonspecific cognitive profile is likely multifactorial in nature. While recent neuroimaging has not suggested any sarcoid involvement of the CNS, she did describe quite severe fatigue. Significant fatigue is common in sarcoidosis and could certainly influence Ms. Pooley's pattern of deficits, especially with regard to attention/concentration and learning aspects  of memory. Additionally, she has several cardiovascular medical ailments which can impact cognitive functioning, as well as what appears to be poorly managed mild sleep apnea. At the present time, the combination of these factors (as well as various stressors and mild psychiatric concerns) represents the most likely culprit for testing performances and her day-to-day experiences. Side effects  from prednisone may also play a mild role in her ongoing presentation. Across memory testing, Ms. Balthazar greater area of difficulty surrounds encoding, or the initial learning of new information. While delayed retrieval performances across memory tasks were normatively low, this is likely influenced by the inefficiency at which she was able to learn new information initially. Importantly, retention rates ranged from 71% to 133% across memory testing. This suggests that she is able to retain the information she learns well, it would simply appear more challenging for her to learn it in the first place. This can certainly be seen with the factors described above.  MRI of the brain, personally reviewed, April 2024, without evidence of acute intracranial abnormality, or intracranial sarcoidosis involvement.  No age advanced or lobar predominant parenchymal atrophy noted.  There are mild chronic small vessel ischemic changes, similar to the prior MRI of the brain of March 2024.  Past Medical History:  Diagnosis Date   Back pain    Constipation    Contact dermatitis 02/11/2021   Elevated LFTs 12/12/2018   Fatty liver    Fen-phen history 11/24/2018   Generalized anxiety disorder 09/25/2022   Hypercholesteremia    Hypertension    Intestinal bacterial overgrowth    Joint pain    Kidney stones    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   Lumbar pain 07/12/2021   Lymphadenopathy 06/13/2022   Migraines    Mild cognitive impairment 02/02/2023   Mixed hyperlipidemia 12/12/2018   Morbid obesity 11/24/2018   Myalgia, unspecified site 06/21/2021   Nausea in adult    Obstructive sleep apnea    mild, no CPAP   Other allergic rhinitis 04/04/2022   Pain in thoracic spine 07/18/2021   Sarcoidosis    Sleep disturbance 10/08/2019   Splenic lesion 05/20/2022   Subclinical hypothyroidism 09/2009   Type 2 diabetes mellitus without complication, without long-term current use of insulin 05/05/2022     Past Surgical  History:  Procedure Laterality Date   BLADDER SUSPENSION N/A 06/05/2022   Procedure: TRANSVAGINAL TAPE (TVT) PROCEDURE;  Surgeon: Marguerita Beards, MD;  Location: [facility];  Service: Gynecology;  Laterality: N/A;  total time requested is 1 hour   COLONOSCOPY     CYSTOSCOPY N/A 06/05/2022   Procedure: CYSTOSCOPY;  Surgeon: Marguerita Beards, MD;  Location: [facility];  Service: Gynecology;  Laterality: N/A;   LITHOTRIPSY     LYMPH NODE DISSECTION     NOVASURE ABLATION  12/18/2006   RADIOACTIVE SEED GUIDED EXCISIONAL BREAST BIOPSY Right 05/30/2021   Procedure: RADIOACTIVE SEED GUIDED EXCISIONAL RIGHT BREAST BIOPSY;  Surgeon: Emelia Loron, MD;  Location: [facility];  Service: General;  Laterality: Right;   ROTATOR CUFF REPAIR Right 06/20/2007     PREVIOUS MEDICATIONS:   CURRENT MEDICATIONS:  Outpatient Encounter Medications as of 04/03/2023  Medication Sig   Budeson-Glycopyrrol-Formoterol (BREZTRI AEROSPHERE) 160-9-4.8 MCG/ACT AERO Inhale 2 puffs into the lungs in the morning and at bedtime.   clobetasol ointment (TEMOVATE) 0.05 % Apply 1 Application topically 2 (two) times daily. Use for 2 weeks at a time for a flare.  You may use  the cream twice a week at bedtime for maintenance dosing.   Continuous Glucose Sensor (DEXCOM G7 SENSOR) MISC 1 Device by Does not apply route as directed.   Corticotropin Gel (ACTHAR GEL) 40 UNIT/0.5ML AUIJ Inject 40 mg into the skin every 3 (three) days.   EYSUVIS 0.25 % SUSP Apply to eye.   folic acid (FOLVITE) 1 MG tablet Take 2 tablets (2 mg total) by mouth daily.   insulin aspart (NOVOLOG FLEXPEN) 100 UNIT/ML FlexPen Max daily 30 units daily   Insulin Pen Needle 32G X 4 MM MISC 1 Device by Does not apply route in the morning, at noon, in the evening, and at bedtime.   metFORMIN (GLUCOPHAGE-XR) 500 MG 24 hr tablet Take 2 tablets (1,000 mg total) by mouth daily before supper.   methotrexate  (RHEUMATREX) 2.5 MG tablet Take 8 tablets (20 mg total) by mouth once a week.   omeprazole (PRILOSEC) 20 MG capsule Take 1 capsule (20 mg total) by mouth daily.   ondansetron (ZOFRAN) 4 MG tablet Take 1 tablet (4 mg total) by mouth daily at 2 PM.   predniSONE (DELTASONE) 5 MG tablet Take 2 tablets (10 mg total) by mouth daily with breakfast.   tirzepatide (MOUNJARO) 7.5 MG/0.5ML Pen Inject 7.5 mg into the skin once a week.   valACYclovir (VALTREX) 1000 MG tablet Take 2 Tablets By Mouth Every 12 Hours for 24 hours as needed for an outbreak.   Vibegron 75 MG TABS Take 1 tablet (75 mg total) by mouth daily.   No facility-administered encounter medications on file as of 04/03/2023.     Objective:     PHYSICAL EXAMINATION:    VITALS:  There were no vitals filed for this visit.  GEN:  The patient appears stated age and is in NAD. HEENT:  Normocephalic, atraumatic.   Neurological examination:  General: NAD, well-groomed, appears stated age. Orientation: The patient is alert. Oriented to person, place and date Cranial nerves: There is good facial symmetry.The speech is fluent and clear. No aphasia or dysarthria. Fund of knowledge is appropriate. Recent memory impaired and remote memory is normal.  Attention and concentration are normal.  Able to name objects and repeat phrases.  Hearing is intact to conversational tone ***.   Delayed recall *** Sensation: Sensation is intact to light touch throughout Motor: Strength is at least antigravity x4. DTR's 2/4 in UE/LE      10/02/2022    8:00 AM  Montreal Cognitive Assessment   Visuospatial/ Executive (0/5) 5  Naming (0/3) 3  Attention: Read list of digits (0/2) 2  Attention: Read list of letters (0/1) 1  Attention: Serial 7 subtraction starting at 100 (0/3) 3  Language: Repeat phrase (0/2) 1  Language : Fluency (0/1) 0  Abstraction (0/2) 1  Delayed Recall (0/5) 5  Orientation (0/6) 6  Total 27  Adjusted Score (based on education) 27         No data to display             Movement examination: Tone: There is normal tone in the UE/LE Abnormal movements:  no tremor.  No myoclonus.  No asterixis.   Coordination:  There is no decremation with RAM's. Normal finger to nose  Gait and Station: The patient has no difficulty arising out of a deep-seated chair without the use of the hands. The patient's stride length is good.  Gait is cautious and narrow.   Thank you for allowing [place] the opportunity to participate in the  care of this nice patient. Please do not hesitate to contact us for any questions or concerns.   Total time spent on today's visit was *** minutes dedicated to this patient today, preparing to see patient, examining the patient, ordering tests and/or medications and counseling the patient, documenting clinical information in the EHR or other health record, independently interpreting results and communicating results to the patient/family, discussing treatment and goals, answering patient's questions and coordinating care.  Cc:  Fatima Sanger, FNP  Marlowe Kays 04/02/2023 6:09 PM

## 2023-04-02 NOTE — Telephone Encounter (Signed)
April has been notified

## 2023-04-02 NOTE — Telephone Encounter (Signed)
Holding off on Acthar as patient trying to wean off prednisone now.

## 2023-04-02 NOTE — Telephone Encounter (Signed)
[name] with Acthar wants to know if we want to continue process with getting medication or hold off and start over if they decided to take it.

## 2023-04-02 NOTE — Telephone Encounter (Signed)
Im gonna hold off on this as she was told to stop for 2 weeks. She has been off 1 week and her headaches are better. Not waking up with them as bad. She will call next week to update.

## 2023-04-03 ENCOUNTER — Other Ambulatory Visit (INDEPENDENT_AMBULATORY_CARE_PROVIDER_SITE_OTHER): Payer: BC Managed Care – PPO

## 2023-04-03 ENCOUNTER — Encounter (HOSPITAL_COMMUNITY)
Admission: RE | Admit: 2023-04-03 | Discharge: 2023-04-03 | Disposition: A | Discharge status: Home / Self Care | Payer: BC Managed Care – PPO | Source: Ambulatory Visit | Attending: Pulmonary Disease | Admitting: Pulmonary Disease

## 2023-04-03 ENCOUNTER — Ambulatory Visit (INDEPENDENT_AMBULATORY_CARE_PROVIDER_SITE_OTHER): Payer: BC Managed Care – PPO | Admitting: Physician Assistant

## 2023-04-03 ENCOUNTER — Encounter (HOSPITAL_COMMUNITY): Payer: Self-pay

## 2023-04-03 ENCOUNTER — Encounter: Payer: Self-pay | Admitting: Physician Assistant

## 2023-04-03 ENCOUNTER — Encounter (HOSPITAL_COMMUNITY): Payer: BC Managed Care – PPO

## 2023-04-03 VITALS — BP 121/84 | HR 100 | Resp 18 | Ht 61.0 in | Wt 189.0 lb

## 2023-04-03 DIAGNOSIS — G629 Polyneuropathy, unspecified: Secondary | ICD-10-CM | POA: Diagnosis not present

## 2023-04-03 DIAGNOSIS — R202 Paresthesia of skin: Secondary | ICD-10-CM | POA: Insufficient documentation

## 2023-04-03 DIAGNOSIS — M542 Cervicalgia: Secondary | ICD-10-CM | POA: Diagnosis not present

## 2023-04-03 LAB — B12 AND FOLATE PANEL
Folate: >24.2 ng/mL (ref >5.9)
Vitamin B-12: 316 pg/mL (ref 211–911)

## 2023-04-03 LAB — C-REACTIVE PROTEIN: CRP: <1 mg/dL (ref 0.5–20.0)

## 2023-04-03 LAB — SEDIMENTATION RATE: Sed Rate: 13 mm/h (ref 0–30)

## 2023-04-03 NOTE — Progress Notes (Signed)
B12 is on the lower side, recommend 1000 mcg daily, follow with PCP. So far the rest of the labs are normal. Thanks

## 2023-04-03 NOTE — Patient Instructions (Addendum)
It was a pleasure to see you today at our office.   Recommendations:  C spine MRI  and Lumbar MRI  with and without contrast for numbness in B arms  imaging [phone] Check neuropathy labs including B12 suite  Follow up pending on the results      RECOMMENDATIONS FOR ALL PATIENTS WITH MEMORY PROBLEMS: 1. Continue to exercise (Recommend 30 minutes of walking everyday, or 3 hours every week) 2. Increase social interactions - continue going to [place] and enjoy social gatherings with friends and family 3. Eat healthy, avoid fried foods and eat more fruits and vegetables 4. Maintain adequate blood pressure, blood sugar, and blood cholesterol level. Reducing the risk of stroke and cardiovascular disease also helps promoting better memory. 5. Avoid stressful situations. Live a simple life and avoid aggravations. Organize your time and prepare for the next day in anticipation. 6. Sleep well, avoid any interruptions of sleep and avoid any distractions in the bedroom that may interfere with adequate sleep quality 7. Avoid sugar, avoid sweets as there is a strong link between excessive sugar intake, diabetes, and cognitive impairment We discussed the Mediterranean diet, which has been shown to help patients reduce the risk of progressive memory disorders and reduces cardiovascular risk. This includes eating fish, eat fruits and green leafy vegetables, nuts like almonds and hazelnuts, walnuts, and also use olive oil. Avoid fast foods and fried foods as much as possible. Avoid sweets and sugar as sugar use has been linked to worsening of memory function.  There is always a concern of gradual progression of memory problems. If this is the case, then we may need to adjust level of care according to patient needs. Support, both to the patient and caregiver, should then be put into place.         FALL PRECAUTIONS: Be cautious when walking. Scan the area for obstacles that may increase the  risk of trips and falls. When getting up in the mornings, sit up at the edge of the bed for a few minutes before getting out of bed. Consider elevating the bed at the head end to avoid drop of blood pressure when getting up. Walk always in a well-lit room (use night lights in the walls). Avoid area rugs or power cords from appliances in the middle of the walkways. Use a walker or a cane if necessary and consider physical therapy for balance exercise. Get your eyesight checked regularly.  FINANCIAL OVERSIGHT: Supervision, especially oversight when making financial decisions or transactions is also recommended.  HOME SAFETY: Consider the safety of the kitchen when operating appliances like stoves, microwave oven, and blender. Consider having supervision and share cooking responsibilities until no longer able to participate in those. Accidents with firearms and other hazards in the house should be identified and addressed as well.   ABILITY TO BE LEFT ALONE: If patient is unable to contact 911 operator, consider using LifeLine, or when the need is there, arrange for someone to stay with patients. Smoking is a fire hazard, consider supervision or cessation. Risk of wandering should be assessed by caregiver and if detected at any point, supervision and safe proof recommendations should be instituted.  MEDICATION SUPERVISION: Inability to self-administer medication needs to be constantly addressed. Implement a mechanism to ensure safe administration of the medications.   DRIVING: Regarding driving, in patients with progressive memory problems, driving will be impaired. We ad-vise to have someone else do the driving if trouble finding directions or if minor accidents are reported.  Independent driving assessment is available to determine safety of driving.   If you are interested in the driving assessment, you can contact the following:  [employer] in [place] [phone]  Driver  Rehabilitative Services [phone]  [facility] [phone] or [phone]    Mediterranean Diet A Mediterranean diet refers to food and lifestyle choices that are based on the traditions of countries located on the [employer]. This way of eating has been shown to help prevent certain conditions and improve outcomes for people who have chronic diseases, like kidney disease and heart disease. What are tips for following this plan? Lifestyle  Cook and eat meals together with your family, when possible. Drink enough fluid to keep your urine clear or pale yellow. Be physically active every day. This includes: Aerobic exercise like running or swimming. Leisure activities like gardening, walking, or housework. Get 7-8 hours of sleep each night. If recommended by your health care provider, drink red wine in moderation. This means 1 glass a day for nonpregnant women and 2 glasses a day for men. A glass of wine equals 5 oz (150 mL). Reading food labels  Check the serving size of packaged foods. For foods such as rice and pasta, the serving size refers to the amount of cooked product, not dry. Check the total fat in packaged foods. Avoid foods that have saturated fat or trans fats. Check the ingredients list for added sugars, such as corn syrup. Shopping  At the grocery store, buy most of your food from the areas near the walls of the store. This includes: Fresh fruits and vegetables (produce). Grains, beans, nuts, and seeds. Some of these may be available in unpackaged forms or large amounts (in bulk). Fresh seafood. Poultry and eggs. Low-fat dairy products. Buy whole ingredients instead of prepackaged foods. Buy fresh fruits and vegetables in-season from local farmers markets. Buy frozen fruits and vegetables in resealable bags. If you do not have access to quality fresh seafood, buy precooked frozen shrimp or canned fish, such as tuna,  salmon, or sardines. Buy small amounts of raw or cooked vegetables, salads, or olives from the deli or salad bar at your store. Stock your pantry so you always have certain foods on hand, such as olive oil, canned tuna, canned tomatoes, rice, pasta, and beans. Cooking  Cook foods with extra-virgin olive oil instead of using butter or other vegetable oils. Have meat as a side dish, and have vegetables or grains as your main dish. This means having meat in small portions or adding small amounts of meat to foods like pasta or stew. Use beans or vegetables instead of meat in common dishes like chili or lasagna. Experiment with different cooking methods. Try roasting or broiling vegetables instead of steaming or sauteing them. Add frozen vegetables to soups, stews, pasta, or rice. Add nuts or seeds for added healthy fat at each meal. You can add these to yogurt, salads, or vegetable dishes. Marinate fish or vegetables using olive oil, lemon juice, garlic, and fresh herbs. Meal planning  Plan to eat 1 vegetarian meal one day each week. Try to work up to 2 vegetarian meals, if possible. Eat seafood 2 or more times a week. Have healthy snacks readily available, such as: Vegetable sticks with hummus. Greek yogurt. Fruit and nut trail mix. Eat balanced meals throughout the week. This includes: Fruit: 2-3 servings a day Vegetables: 4-5 servings a day Low-fat dairy: 2 servings a day Fish, poultry, or  lean meat: 1 serving a day Beans and legumes: 2 or more servings a week Nuts and seeds: 1-2 servings a day Whole grains: 6-8 servings a day Extra-virgin olive oil: 3-4 servings a day Limit red meat and sweets to only a few servings a month What are my food choices? Mediterranean diet Recommended Grains: Whole-grain pasta. Brown rice. Bulgar wheat. Polenta. Couscous. Whole-wheat bread. Orpah Cobb. Vegetables: Artichokes. Beets. Broccoli. Cabbage. Carrots. Eggplant. Green beans. Chard. Kale.  Spinach. Onions. Leeks. Peas. Squash. Tomatoes. Peppers. Radishes. Fruits: Apples. Apricots. Avocado. Berries. Bananas. Cherries. Dates. Figs. Grapes. Lemons. Melon. Oranges. Peaches. Plums. Pomegranate. Meats and other protein foods: Beans. Almonds. Sunflower seeds. Pine nuts. Peanuts. Cod. Salmon. Scallops. Shrimp. Tuna. Tilapia. Clams. Oysters. Eggs. Dairy: Low-fat milk. Cheese. Greek yogurt. Beverages: Water. Red wine. Herbal tea. Fats and oils: Extra virgin olive oil. Avocado oil. Grape seed oil. Sweets and desserts: [place] yogurt with honey. Baked apples. Poached pears. Trail mix. Seasoning and other foods: Basil. Cilantro. Coriander. Cumin. Mint. Parsley. Sage. Rosemary. Tarragon. Garlic. Oregano. Thyme. Pepper. Balsalmic vinegar. Tahini. Hummus. Tomato sauce. Olives. Mushrooms. Limit these Grains: Prepackaged pasta or rice dishes. Prepackaged cereal with added sugar. Vegetables: Deep fried potatoes (french fries). Fruits: Fruit canned in syrup. Meats and other protein foods: Beef. Pork. Lamb. Poultry with skin. Hot dogs. Tomasa Blase. Dairy: Ice cream. Sour cream. Whole milk. Beverages: Juice. Sugar-sweetened soft drinks. Beer. Liquor and spirits. Fats and oils: Butter. Canola oil. Vegetable oil. Beef fat (tallow). Lard. Sweets and desserts: Cookies. Cakes. Pies. Candy. Seasoning and other foods: Mayonnaise. Premade sauces and marinades. The items listed may not be a complete list. Talk with your dietitian about what dietary choices are right for you. Summary The Mediterranean diet includes both food and lifestyle choices. Eat a variety of fresh fruits and vegetables, beans, nuts, seeds, and whole grains. Limit the amount of red meat and sweets that you eat. Talk with your health care provider about whether it is safe for you to drink red wine in moderation. This means 1 glass a day for nonpregnant women and 2 glasses a day for men. A glass of wine equals 5 oz (150 mL). This information is  not intended to replace advice given to you by your health care provider. Make sure you discuss any questions you have with your health care provider. Document Released: 01/27/2016 Document Revised: 02/29/2016 Document Reviewed: 01/27/2016 Elsevier Interactive Patient Education  2017 [employer].

## 2023-04-05 ENCOUNTER — Encounter (HOSPITAL_COMMUNITY): Payer: BC Managed Care – PPO

## 2023-04-05 ENCOUNTER — Encounter (HOSPITAL_COMMUNITY)
Admission: RE | Admit: 2023-04-05 | Discharge: 2023-04-05 | Disposition: A | Discharge status: Home / Self Care | Payer: BC Managed Care – PPO | Source: Ambulatory Visit | Attending: Pulmonary Disease | Admitting: Pulmonary Disease

## 2023-04-05 DIAGNOSIS — D869 Sarcoidosis, unspecified: Secondary | ICD-10-CM

## 2023-04-05 NOTE — Telephone Encounter (Signed)
Received fax from [facility] that patient's first level appeal for Acthar has been DENIED for same reasons as before.  Patient has held discontinued Acthar due to numerous side effects.  Therefore, holding off on moving forward with Acthar 2nd level appeal.  Chesley Mires, PharmD, MPH, BCPS, CPP Clinical Pharmacist (Rheumatology and Pulmonology)

## 2023-04-05 NOTE — Progress Notes (Signed)
Daily Session Note  Patient Details  Name: [name] MRN: [id number] Date of Birth: 1965-07-01 Referring Provider:   Doristine Devoid Pulmonary Rehab Walk Test from 01/12/2023 in [facility]  Referring Provider [name]       Encounter Date: 04/05/2023  Check In:  Session Check In - 04/05/23 0830       Check-In   Supervising physician immediately available to respond to emergencies [facility] MD immediately available    Physician(s) Jari Favre, PA    Location [facility]    Staff Present Essie Hart, RN, BSN;Randi Idelle Crouch BS, ACSM-CEP, Exercise Physiologist;Kaylee Earlene Plater, MS, ACSM-CEP, Exercise Physiologist;Yamel Bale Hermine Messick Belarus, RD, LDN    Virtual Visit No    Medication changes reported     No    Fall or balance concerns reported    No    Tobacco Cessation No Change    Warm-up and Cool-down Performed as group-led instruction    Resistance Training Performed Yes    VAD Patient? No    PAD/SET Patient? No      Pain Assessment   Currently in Pain? No/denies    Multiple Pain Sites No             Capillary Blood Glucose: No results found for this or any previous visit (from the past 24 hour(s)).    Social History   Tobacco Use  Smoking Status Never   Passive exposure: Never  Smokeless Tobacco Never    Goals Met:  Proper associated with RPD/PD & O2 Sat Independence with exercise equipment Exercise tolerated well No report of concerns or symptoms today Strength training completed today  Goals Unmet:  Not Applicable  Comments: Service time is from 0816 to 0935.

## 2023-04-06 LAB — IMMUNOFIXATION ELECTROPHORESIS
IgG (Immunoglobin G), Serum: 768 mg/dL (ref 600–1640)
IgM, Serum: 35 mg/dL — ABNORMAL LOW (ref 50–300)
Immunoglobulin A: 169 mg/dL (ref 47–310)

## 2023-04-06 LAB — PROTEIN ELECTROPHORESIS, SERUM
Albumin ELP: 4.1 g/dL (ref 3.8–4.8)
Alpha 1: 0.3 g/dL (ref 0.2–0.3)
Alpha 2: 0.8 g/dL (ref 0.5–0.9)
Beta 2: 0.4 g/dL (ref 0.2–0.5)
Beta Globulin: 0.4 g/dL (ref 0.4–0.6)
Gamma Globulin: 0.6 g/dL — ABNORMAL LOW (ref 0.8–1.7)
Total Protein: 6.6 g/dL (ref 6.1–8.1)

## 2023-04-06 LAB — VITAMIN B6: Vitamin B6: 7.4 ng/mL (ref 2.1–21.7)

## 2023-04-10 ENCOUNTER — Encounter (HOSPITAL_COMMUNITY)
Admission: RE | Admit: 2023-04-10 | Discharge: 2023-04-10 | Disposition: A | Discharge status: Home / Self Care | Payer: BC Managed Care – PPO | Source: Ambulatory Visit | Attending: Pulmonary Disease | Admitting: Pulmonary Disease

## 2023-04-10 ENCOUNTER — Encounter (HOSPITAL_COMMUNITY): Payer: BC Managed Care – PPO

## 2023-04-10 ENCOUNTER — Encounter (HOSPITAL_BASED_OUTPATIENT_CLINIC_OR_DEPARTMENT_OTHER): Payer: Self-pay | Admitting: Pulmonary Disease

## 2023-04-10 DIAGNOSIS — D869 Sarcoidosis, unspecified: Secondary | ICD-10-CM | POA: Diagnosis not present

## 2023-04-10 NOTE — Progress Notes (Signed)
Daily Session Note  Patient Details  Name: [name] MRN: [id number] Date of Birth: [birth year] Referring Provider:   Doristine Devoid Pulmonary Rehab Walk Test from 01/12/2023 in [facility]  Referring Provider [name]       Encounter Date: 04/10/2023  Check In:  Session Check In - 04/10/23 4098       Check-In   Supervising physician immediately available to respond to emergencies [facility] MD immediately available    Physician(s) Edd Fabian, NP    Location [facility]    Staff Present Essie Hart, RN, BSN;Randi Idelle Crouch BS, ACSM-CEP, Exercise Physiologist;Kaylee Earlene Plater, MS, ACSM-CEP, Exercise Physiologist;Shiva Karis Katrinka Blazing, RT    Virtual Visit No    Medication changes reported     No    Fall or balance concerns reported    No    Tobacco Cessation No Change    Warm-up and Cool-down Performed as group-led instruction    Resistance Training Performed Yes    VAD Patient? No    PAD/SET Patient? No      Pain Assessment   Currently in Pain? No/denies    Multiple Pain Sites No             Capillary Blood Glucose: No results found for this or any previous visit (from the past 24 hour(s)).    Social History   Tobacco Use  Smoking Status Never   Passive exposure: Never  Smokeless Tobacco Never    Goals Met:  Proper associated with RPD/PD & O2 Sat Independence with exercise equipment Exercise tolerated well No report of concerns or symptoms today Strength training completed today  Goals Unmet:  Not Applicable  Comments: Service time is from 0811 to 0935.

## 2023-04-12 ENCOUNTER — Encounter (HOSPITAL_COMMUNITY)
Admission: RE | Admit: 2023-04-12 | Discharge: 2023-04-12 | Disposition: A | Discharge status: Home / Self Care | Payer: BC Managed Care – PPO | Source: Ambulatory Visit | Attending: Pulmonary Disease | Admitting: Pulmonary Disease

## 2023-04-12 ENCOUNTER — Encounter (HOSPITAL_COMMUNITY): Payer: BC Managed Care – PPO

## 2023-04-12 DIAGNOSIS — D869 Sarcoidosis, unspecified: Secondary | ICD-10-CM

## 2023-04-12 NOTE — Progress Notes (Signed)
Daily Session Note  Patient Details  Name: [name] MRN: [id number] Date of Birth: [birth year] Referring Provider:   Doristine Devoid Pulmonary Rehab Walk Test from 01/12/2023 in [facility]  Referring Provider [name]       Encounter Date: 04/12/2023  Check In:  Session Check In - 04/12/23 7829       Check-In   Supervising physician immediately available to respond to emergencies [facility] MD immediately available    Physician(s) [name], NP    Location [facility]    Staff Present Essie Hart, RN, BSN;Randi Idelle Crouch BS, ACSM-CEP, Exercise Physiologist;Jovi Alvizo Earlene Plater, MS, ACSM-CEP, Exercise Physiologist;Casey Katrinka Blazing, RT    Virtual Visit No    Medication changes reported     No    Fall or balance concerns reported    No    Tobacco Cessation No Change    Warm-up and Cool-down Performed as group-led instruction    Resistance Training Performed Yes    VAD Patient? No    PAD/SET Patient? No      Pain Assessment   Currently in Pain? No/denies    Multiple Pain Sites No             Capillary Blood Glucose: No results found for this or any previous visit (from the past 24 hour(s)).    Social History   Tobacco Use  Smoking Status Never   Passive exposure: Never  Smokeless Tobacco Never    Goals Met:  Proper associated with RPD/PD & O2 Sat Independence with exercise equipment Exercise tolerated well No report of concerns or symptoms today Strength training completed today  Goals Unmet:  Not Applicable  Comments: Service time is from 0812 to 0941.   Dr. Mechele Collin is Medical Director for Pulmonary Rehab at [facility].

## 2023-04-13 ENCOUNTER — Encounter: Payer: Self-pay | Admitting: Internal Medicine

## 2023-04-13 LAB — COPPER, SERUM: Copper: 109 ug/dL (ref 70–175)

## 2023-04-16 ENCOUNTER — Institutional Professional Consult (permissible substitution): Payer: BC Managed Care – PPO | Admitting: Psychology

## 2023-04-16 ENCOUNTER — Ambulatory Visit: Payer: Self-pay

## 2023-04-17 ENCOUNTER — Encounter (HOSPITAL_COMMUNITY)
Admission: RE | Admit: 2023-04-17 | Discharge: 2023-04-17 | Disposition: A | Discharge status: Home / Self Care | Payer: BC Managed Care – PPO | Source: Ambulatory Visit | Attending: Pulmonary Disease

## 2023-04-17 VITALS — Wt 190.7 lb

## 2023-04-17 DIAGNOSIS — D869 Sarcoidosis, unspecified: Secondary | ICD-10-CM | POA: Diagnosis not present

## 2023-04-17 NOTE — Progress Notes (Signed)
Daily Session Note  Patient Details  Name: [name] MRN: [id number] Date of Birth: [birth year] Referring Provider:   Doristine Devoid Pulmonary Rehab Walk Test from 01/12/2023 in [facility]  Referring Provider [name]       Encounter Date: 04/17/2023  Check In:  Session Check In - 04/17/23 0841       Check-In   Supervising physician immediately available to respond to emergencies [facility] MD immediately available    Physician(s) Neila Gear, NP    Location [facility]    Staff Present Essie Hart, RN, Doris Cheadle, MS, ACSM-CEP, Exercise Physiologist;Casey Katrinka Blazing, RT    Virtual Visit No    Medication changes reported     No    Fall or balance concerns reported    No    Tobacco Cessation No Change    Warm-up and Cool-down Performed as group-led instruction    Resistance Training Performed Yes    VAD Patient? No    PAD/SET Patient? No      Pain Assessment   Currently in Pain? No/denies    Multiple Pain Sites No             Capillary Blood Glucose: No results found for this or any previous visit (from the past 24 hour(s)).   Exercise Prescription Changes - 04/17/23 0900       Response to Exercise   Blood Pressure (Admit) 128/72    Blood Pressure (Exercise) 114/80    Blood Pressure (Exit) 112/66    Heart Rate (Admit) 91 bpm    Heart Rate (Exercise) 114 bpm    Heart Rate (Exit) 96 bpm    Oxygen Saturation (Admit) 96 %    Oxygen Saturation (Exercise) 97 %    Oxygen Saturation (Exit) 97 %    Rating of Perceived Exertion (Exercise) 13    Perceived Dyspnea (Exercise) 1    Duration Continue with 30 min of aerobic exercise without signs/symptoms of physical distress.    Intensity THRR unchanged      Progression   Progression Continue to progress workloads to maintain intensity without signs/symptoms of physical distress.      Resistance Training   Training Prescription Yes    Weight  black bands    Reps 10-15    Time 10 Minutes      Treadmill   MPH 2.7    Grade 1.5    Minutes 15    METs 3.3      Elliptical   Level 3    Speed 2    Minutes 15    METs 4.4             Social History   Tobacco Use  Smoking Status Never   Passive exposure: Never  Smokeless Tobacco Never    Goals Met:  Proper associated with RPD/PD & O2 Sat Independence with exercise equipment Exercise tolerated well No report of concerns or symptoms today Strength training completed today  Goals Unmet:  Not Applicable  Comments: Service time is from 0813 to [phone].   Dr. Mechele Collin is Medical Director for Pulmonary Rehab at [facility].

## 2023-04-18 ENCOUNTER — Encounter: Payer: Self-pay | Admitting: Pulmonary Disease

## 2023-04-18 ENCOUNTER — Ambulatory Visit
Admission: RE | Admit: 2023-04-18 | Discharge: 2023-04-18 | Disposition: A | Discharge status: Home / Self Care | Payer: BC Managed Care – PPO | Source: Ambulatory Visit | Attending: Physician Assistant | Admitting: Physician Assistant

## 2023-04-18 MED ORDER — GADOPICLENOL 0.5 MMOL/ML IV SOLN
9.0000 mL | Freq: Once | INTRAVENOUS | Status: AC | PRN
  Administered 2023-04-18: 9 mL via INTRAVENOUS

## 2023-04-18 NOTE — Progress Notes (Signed)
Pulmonary Individual Treatment Plan  Patient Details  Name: CHEQUITA WARLEY MRN: [id number] Date of Birth: 1966-05-10 Referring Provider:   Doristine Devoid Pulmonary Rehab Walk Test from 01/12/2023 in [facility]  Referring Provider [name]       Initial Encounter Date:  Flowsheet Row Pulmonary Rehab Walk Test from 01/12/2023 in [facility]  Date 01/12/23       Visit Diagnosis: Sarcoidosis  Patient's Home Medications on Admission:   Current Outpatient Medications:    Budeson-Glycopyrrol-Formoterol (BREZTRI AEROSPHERE) 160-9-4.8 MCG/ACT AERO, Inhale 2 puffs into the lungs in the morning and at bedtime., Disp: 10.7 g, Rfl: 5   clobetasol ointment (TEMOVATE) 0.05 %, Apply 1 Application topically 2 (two) times daily. Use for 2 weeks at a time for a flare.  You may use the cream twice a week at bedtime for maintenance dosing., Disp: 60 g, Rfl: 1   Continuous Glucose Sensor (DEXCOM G7 SENSOR) MISC, 1 Device by Does not apply route as directed., Disp: 9 each, Rfl: 3   Corticotropin Gel (ACTHAR GEL) 40 UNIT/0.5ML AUIJ, Inject 40 mg into the skin every 3 (three) days., Disp: , Rfl:    EYSUVIS 0.25 % SUSP, Apply to eye., Disp: , Rfl:    folic acid (FOLVITE) 1 MG tablet, Take 2 tablets (2 mg total) by mouth daily., Disp: 180 tablet, Rfl: 11   insulin aspart (NOVOLOG FLEXPEN) 100 UNIT/ML FlexPen, Max daily 30 units daily, Disp: 15 mL, Rfl: 11   Insulin Pen Needle 32G X 4 MM MISC, 1 Device by Does not apply route in the morning, at noon, in the evening, and at bedtime., Disp: 400 each, Rfl: 3   metFORMIN (GLUCOPHAGE-XR) 500 MG 24 hr tablet, Take 2 tablets (1,000 mg total) by mouth daily before supper., Disp: 180 tablet, Rfl: 3   methotrexate (RHEUMATREX) 2.5 MG tablet, Take 8 tablets (20 mg total) by mouth once a week., Disp: 96 tablet, Rfl: 1   omeprazole (PRILOSEC) 20 MG capsule,  Take 1 capsule (20 mg total) by mouth daily., Disp: 90 capsule, Rfl: 5   ondansetron (ZOFRAN) 4 MG tablet, Take 1 tablet (4 mg total) by mouth daily at 2 PM., Disp: 30 tablet, Rfl: 0   predniSONE (DELTASONE) 5 MG tablet, Take 2 tablets (10 mg total) by mouth daily with breakfast., Disp: 60 tablet, Rfl: 2   tirzepatide (MOUNJARO) 7.5 MG/0.5ML Pen, Inject 7.5 mg into the skin once a week., Disp: 6 mL, Rfl: 3   valACYclovir (VALTREX) 1000 MG tablet, Take 2 Tablets By Mouth Every 12 Hours for 24 hours as needed for an outbreak., Disp: 30 tablet, Rfl: 1   Vibegron 75 MG TABS, Take 1 tablet (75 mg total) by mouth daily., Disp: 90 tablet, Rfl: 3  Past Medical History: Past Medical History:  Diagnosis Date   Back pain    Constipation    Contact dermatitis 02/11/2021   Elevated LFTs 12/12/2018   Fatty liver    Fen-phen history 11/24/2018   Generalized anxiety disorder 09/25/2022   Hypercholesteremia    Hypertension    Intestinal bacterial overgrowth    Joint pain    Kidney stones    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   Lumbar pain 07/12/2021   Lymphadenopathy 06/13/2022   Migraines    Mild cognitive impairment 02/02/2023   Mixed hyperlipidemia 12/12/2018   Morbid obesity 11/24/2018   Myalgia, unspecified site 06/21/2021  Nausea in adult    Obstructive sleep apnea    mild, no CPAP   Other allergic rhinitis 04/04/2022   Pain in thoracic spine 07/18/2021   Sarcoidosis    Sleep disturbance 10/08/2019   Splenic lesion 05/20/2022   Subclinical hypothyroidism 09/2009   Type 2 diabetes mellitus without complication, without long-term current use of insulin 05/05/2022    Tobacco Use: Social History   Tobacco Use  Smoking Status Never   Passive exposure: Never  Smokeless Tobacco Never    Labs: Review Flowsheet  More data exists      Latest Ref Rng & Units 12/15/2019 05/05/2022 06/05/2022 09/19/2022 03/21/2023  Labs for ITP Cardiac and Pulmonary Rehab  Cholestrol 100 - 199 mg/dL 102  - - -  -  LDL (calc) 0 - 99 mg/dL 725  - - - -  HDL-C >36 mg/dL 73  - - - -  Trlycerides 0 - 149 mg/dL 644  - - - -  Hemoglobin A1c 4.0 - 5.6 % 6.3  6.0  - 6.6  5.7   TCO2 22 - 32 mmol/L - - 28  - -    Details            Capillary Blood Glucose: Lab Results  Component Value Date   GLUCAP 118 (H) 02/27/2023   GLUCAP 167 (H) 02/27/2023   GLUCAP 118 (H) 06/05/2022   GLUCAP 107 (H) 05/24/2022    POCT Glucose     Row Name 01/12/23 0949             POCT Blood Glucose   Pre-Exercise 122 mg/dL                Pulmonary Assessment Scores:  Pulmonary Assessment Scores     Row Name 01/12/23 0956         ADL UCSD   ADL Phase Entry     SOB Score total 19       CAT Score   CAT Score 11       mMRC Score   mMRC Score 2             UCSD: Self-administered rating of dyspnea associated with activities of daily living (ADLs) 6-point scale (0 = "not at all" to 5 = "maximal or unable to do because of breathlessness")  Scoring Scores range from 0 to 120.  Minimally important difference is 5 units  CAT: CAT can identify the health impairment of COPD patients and is better correlated with disease progression.  CAT has a scoring range of zero to 40. The CAT score is classified into four groups of low (less than 10), medium (10 - 20), high (21-30) and very high (31-40) based on the impact level of disease on health status. A CAT score over 10 suggests significant symptoms.  A worsening CAT score could be explained by an exacerbation, poor medication adherence, poor inhaler technique, or progression of COPD or comorbid conditions.  CAT MCID is 2 points  mMRC: mMRC (Modified Medical Research Council) Dyspnea Scale is used to assess the degree of baseline functional disability in patients of respiratory disease due to dyspnea. No minimal important difference is established. A decrease in score of 1 point or greater is considered a positive change.   Pulmonary Function  Assessment:  Pulmonary Function Assessment - 01/12/23 0930       Breath   Bilateral Breath Sounds Decreased    Shortness of Breath Yes;Fear of Shortness of Breath;Limiting activity  Exercise Target Goals: Exercise Program Goal: Individual exercise prescription set using results from initial 6 min walk test and THRR while considering  patient's activity barriers and safety.   Exercise Prescription Goal: Initial exercise prescription builds to 30-45 minutes a day of aerobic activity, 2-3 days per week.  Home exercise guidelines will be given to patient during program as part of exercise prescription that the participant will acknowledge.  Activity Barriers & Risk Stratification:  Activity Barriers & Cardiac Risk Stratification - 01/12/23 0915       Activity Barriers & Cardiac Risk Stratification   Activity Barriers Balance Concerns;Deconditioning;Muscular Weakness;Shortness of Breath             6 Minute Walk:  6 Minute Walk     Row Name 01/12/23 0958         6 Minute Walk   Phase Initial     Distance 1090 feet     Walk Time 6 minutes     # of Rest Breaks 0     MPH 2.06     METS 2.82     RPE 10     Perceived Dyspnea  1     VO2 Peak 9.88     Symptoms No     Resting HR 89 bpm     Resting BP 122/88     Resting Oxygen Saturation  98 %     Exercise Oxygen Saturation  during 6 min walk 95 %     Max Ex. HR 108 bpm     Max Ex. BP 126/72     2 Minute Post BP 114/70       Interval HR   1 Minute HR 104     2 Minute HR 106     3 Minute HR 104     4 Minute HR 106     5 Minute HR 102     6 Minute HR 108     2 Minute Post HR 92     Interval Heart Rate? Yes       Interval Oxygen   Interval Oxygen? Yes     Baseline Oxygen Saturation % 98 %     1 Minute Oxygen Saturation % 95 %     1 Minute Liters of Oxygen 0 L     2 Minute Oxygen Saturation % 97 %     2 Minute Liters of Oxygen 0 L     3 Minute Oxygen Saturation % 98 %     3 Minute Liters of Oxygen  0 L     4 Minute Oxygen Saturation % 97 %     4 Minute Liters of Oxygen 0 L     5 Minute Oxygen Saturation % 99 %     5 Minute Liters of Oxygen 0 L     6 Minute Oxygen Saturation % 100 %     6 Minute Liters of Oxygen 0 L     2 Minute Post Oxygen Saturation % 100 %     2 Minute Post Liters of Oxygen 0 L              Oxygen Initial Assessment:  Oxygen Initial Assessment - 01/12/23 0926       Home Oxygen   Home Oxygen Device None    Sleep Oxygen Prescription None    Home Exercise Oxygen Prescription None    Home Resting Oxygen Prescription None      Initial 6 min Walk   Oxygen  Used None      Program Oxygen Prescription   Program Oxygen Prescription None      Intervention   Short Term Goals To learn and understand importance of maintaining oxygen saturations>88%;To learn and demonstrate proper use of respiratory medications;To learn and understand importance of monitoring SPO2 with pulse oximeter and demonstrate accurate use of the pulse oximeter.;To learn and demonstrate proper pursed lip breathing techniques or other breathing techniques.     Long  Term Goals Maintenance of O2 saturations>88%;Compliance with respiratory medication;Verbalizes importance of monitoring SPO2 with pulse oximeter and return demonstration;Exhibits proper breathing techniques, such as pursed lip breathing or other method taught during program session;Demonstrates proper use of MDI's             Oxygen Re-Evaluation:  Oxygen Re-Evaluation     Row Name 01/18/23 0919 02/12/23 1226 03/13/23 1605 04/11/23 1029       Program Oxygen Prescription   Program Oxygen Prescription None None None None      Home Oxygen   Home Oxygen Device None None None None    Sleep Oxygen Prescription None None None None    Home Exercise Oxygen Prescription None None None None    Home Resting Oxygen Prescription None None None None      Goals/Expected Outcomes   Short Term Goals To learn and understand importance  of maintaining oxygen saturations>88%;To learn and demonstrate proper use of respiratory medications;To learn and understand importance of monitoring SPO2 with pulse oximeter and demonstrate accurate use of the pulse oximeter.;To learn and demonstrate proper pursed lip breathing techniques or other breathing techniques.  To learn and understand importance of maintaining oxygen saturations>88%;To learn and demonstrate proper use of respiratory medications;To learn and understand importance of monitoring SPO2 with pulse oximeter and demonstrate accurate use of the pulse oximeter.;To learn and demonstrate proper pursed lip breathing techniques or other breathing techniques.  To learn and understand importance of maintaining oxygen saturations>88%;To learn and demonstrate proper use of respiratory medications;To learn and understand importance of monitoring SPO2 with pulse oximeter and demonstrate accurate use of the pulse oximeter.;To learn and demonstrate proper pursed lip breathing techniques or other breathing techniques.  To learn and understand importance of maintaining oxygen saturations>88%;To learn and demonstrate proper use of respiratory medications;To learn and understand importance of monitoring SPO2 with pulse oximeter and demonstrate accurate use of the pulse oximeter.;To learn and demonstrate proper pursed lip breathing techniques or other breathing techniques.     Long  Term Goals Maintenance of O2 saturations>88%;Compliance with respiratory medication;Verbalizes importance of monitoring SPO2 with pulse oximeter and return demonstration;Exhibits proper breathing techniques, such as pursed lip breathing or other method taught during program session;Demonstrates proper use of MDI's Maintenance of O2 saturations>88%;Compliance with respiratory medication;Verbalizes importance of monitoring SPO2 with pulse oximeter and return demonstration;Exhibits proper breathing techniques, such as pursed lip breathing  or other method taught during program session;Demonstrates proper use of MDI's Maintenance of O2 saturations>88%;Compliance with respiratory medication;Verbalizes importance of monitoring SPO2 with pulse oximeter and return demonstration;Exhibits proper breathing techniques, such as pursed lip breathing or other method taught during program session;Demonstrates proper use of MDI's Maintenance of O2 saturations>88%;Compliance with respiratory medication;Verbalizes importance of monitoring SPO2 with pulse oximeter and return demonstration;Exhibits proper breathing techniques, such as pursed lip breathing or other method taught during program session;Demonstrates proper use of MDI's    Goals/Expected Outcomes Compliance and understanding of oxygen saturation monitoring and breathing techniques to decrease shortness of breath Compliance and understanding of oxygen saturation monitoring and  breathing techniques to decrease shortness of breath Compliance and understanding of oxygen saturation monitoring and breathing techniques to decrease shortness of breath Compliance and understanding of oxygen saturation monitoring and breathing techniques to decrease shortness of breath             Oxygen Discharge (Final Oxygen Re-Evaluation):  Oxygen Re-Evaluation - 04/11/23 1029       Program Oxygen Prescription   Program Oxygen Prescription None      Home Oxygen   Home Oxygen Device None    Sleep Oxygen Prescription None    Home Exercise Oxygen Prescription None    Home Resting Oxygen Prescription None      Goals/Expected Outcomes   Short Term Goals To learn and understand importance of maintaining oxygen saturations>88%;To learn and demonstrate proper use of respiratory medications;To learn and understand importance of monitoring SPO2 with pulse oximeter and demonstrate accurate use of the pulse oximeter.;To learn and demonstrate proper pursed lip breathing techniques or other breathing techniques.      Long  Term Goals Maintenance of O2 saturations>88%;Compliance with respiratory medication;Verbalizes importance of monitoring SPO2 with pulse oximeter and return demonstration;Exhibits proper breathing techniques, such as pursed lip breathing or other method taught during program session;Demonstrates proper use of MDI's    Goals/Expected Outcomes Compliance and understanding of oxygen saturation monitoring and breathing techniques to decrease shortness of breath             Initial Exercise Prescription:  Initial Exercise Prescription - 01/12/23 1000       Date of Initial Exercise RX and Referring Provider   Date 01/12/23    Referring Provider [name]    Expected Discharge Date 04/12/23      Treadmill   MPH 2    Grade 0    Minutes 15      Elliptical   Level 1    Speed 1    Minutes 15      Prescription Details   Frequency (times per week) 2    Duration Progress to 30 minutes of continuous aerobic without signs/symptoms of physical distress      Intensity   THRR 40-80% of Max Heartrate 66-131    Ratings of Perceived Exertion 11-13    Perceived Dyspnea 0-4      Progression   Progression Continue to progress workloads to maintain intensity without signs/symptoms of physical distress.      Resistance Training   Training Prescription Yes    Weight black bands    Reps 10-15             Perform Capillary Blood Glucose checks as needed.  Exercise Prescription Changes:   Exercise Prescription Changes     Row Name 01/30/23 1500 03/06/23 0900 03/20/23 0900 03/29/23 0939 04/17/23 0900     Response to Exercise   Blood Pressure (Admit) 130/80 132/74 130/88 132/74 128/72   Blood Pressure (Exercise) 142/80 148/80 138/78 -- 114/80   Blood Pressure (Exit) 126/76 112/70 124/76 130/74 112/66   Heart Rate (Admit) 96 bpm 80 bpm 97 bpm 88 bpm 91 bpm   Heart Rate (Exercise) 118 bpm 117 bpm 111 bpm 113 bpm 114 bpm   Heart Rate (Exit) 104 bpm 94 bpm 92 bpm 81 bpm 96 bpm    Oxygen Saturation (Admit) 96 % 98 % 97 % 96 % 96 %   Oxygen Saturation (Exercise) 96 % 97 % 97 % 95 % 97 %   Oxygen Saturation (Exit) 98 % 96 % 97 % 97 %  97 %   Rating of Perceived Exertion (Exercise) 11 13 13 12 13    Perceived Dyspnea (Exercise) 1 2 1 1 1    Duration Continue with 30 min of aerobic exercise without signs/symptoms of physical distress. Continue with 30 min of aerobic exercise without signs/symptoms of physical distress. Continue with 30 min of aerobic exercise without signs/symptoms of physical distress. Continue with 30 min of aerobic exercise without signs/symptoms of physical distress. Continue with 30 min of aerobic exercise without signs/symptoms of physical distress.   Intensity THRR unchanged THRR unchanged THRR unchanged THRR unchanged THRR unchanged     Progression   Progression -- -- -- -- Continue to progress workloads to maintain intensity without signs/symptoms of physical distress.     Resistance Training   Training Prescription Yes Yes Yes Yes Yes   Weight black bands black bands black bands black bands black bands   Reps 10-15 10-15 10-15 10-15 10-15   Time 10 Minutes 10 Minutes 10 Minutes 10 Minutes 10 Minutes     Treadmill   MPH 2 2.7 2.6 2.7 2.7   Grade 0 1.5 1.5 1.5 1.5   Minutes 15 15 15 15 15    METs 2.53 3.2 3.2 3.63 3.3     Elliptical   Level 1 2 2 2 3    Speed 1 1 1 1 2    Minutes 15 15 15 15 15    METs -- 4.3 3.9 4.1 4.4            Exercise Comments:   Exercise Comments     Row Name 01/30/23 1509 03/08/23 1522         Exercise Comments Elzina completed her first day of exercise. She exercised for 15 min on upright elliptical and treadmill. Leasa averaged 4.3 METs at 1 speed and incline on the upright elliptical and 2.4 METs at 2.0 mph on the treadmill. Eizabeth performed the warmup and cooldown standing without limitations. Discussed METs. Completed home ExRx. Kalayah is currently exercising at the [facility]. She uses the recumbent elliptical  5-7 days/wk for 10-20 min/day. I encouraged her to increase the time to 30 min/day. She agreed with my recommendations. Mablean is motivated to exercise and improve her functional capacity. I am confident in her carrying out an exercise regimen outside of rehab. We also discussed pacing strategies for ADL's as Shaquanna will get short of breath when taking out the trash. Bobbyjo understand to start her pursed lip breathing when she is short of breath.               Exercise Goals and Review:   Exercise Goals     Row Name 01/12/23 4098 01/18/23 0919 01/19/23 1541 02/12/23 1217       Exercise Goals   Increase Physical Activity Yes Yes Yes Yes    Intervention Provide advice, education, support and counseling about physical activity/exercise needs.;Develop an individualized exercise prescription for aerobic and resistive training based on initial evaluation findings, risk stratification, comorbidities and participant's personal goals. Provide advice, education, support and counseling about physical activity/exercise needs.;Develop an individualized exercise prescription for aerobic and resistive training based on initial evaluation findings, risk stratification, comorbidities and participant's personal goals. Provide advice, education, support and counseling about physical activity/exercise needs.;Develop an individualized exercise prescription for aerobic and resistive training based on initial evaluation findings, risk stratification, comorbidities and participant's personal goals. Provide advice, education, support and counseling about physical activity/exercise needs.;Develop an individualized exercise prescription for aerobic and resistive training based on initial evaluation findings,  risk stratification, comorbidities and participant's personal goals.    Expected Outcomes Short Term: Attend rehab on a regular basis to increase amount of physical activity.;Long Term: Exercising regularly at least 3-5  days a week.;Long Term: Add in home exercise to make exercise part of routine and to increase amount of physical activity. Short Term: Attend rehab on a regular basis to increase amount of physical activity.;Long Term: Exercising regularly at least 3-5 days a week.;Long Term: Add in home exercise to make exercise part of routine and to increase amount of physical activity. Short Term: Attend rehab on a regular basis to increase amount of physical activity.;Long Term: Exercising regularly at least 3-5 days a week.;Long Term: Add in home exercise to make exercise part of routine and to increase amount of physical activity. Short Term: Attend rehab on a regular basis to increase amount of physical activity.;Long Term: Exercising regularly at least 3-5 days a week.;Long Term: Add in home exercise to make exercise part of routine and to increase amount of physical activity.    Increase Strength and Stamina Yes Yes Yes Yes    Intervention Provide advice, education, support and counseling about physical activity/exercise needs.;Develop an individualized exercise prescription for aerobic and resistive training based on initial evaluation findings, risk stratification, comorbidities and participant's personal goals. Provide advice, education, support and counseling about physical activity/exercise needs.;Develop an individualized exercise prescription for aerobic and resistive training based on initial evaluation findings, risk stratification, comorbidities and participant's personal goals. Provide advice, education, support and counseling about physical activity/exercise needs.;Develop an individualized exercise prescription for aerobic and resistive training based on initial evaluation findings, risk stratification, comorbidities and participant's personal goals. Provide advice, education, support and counseling about physical activity/exercise needs.;Develop an individualized exercise prescription for aerobic and  resistive training based on initial evaluation findings, risk stratification, comorbidities and participant's personal goals.    Expected Outcomes Short Term: Increase workloads from initial exercise prescription for resistance, speed, and METs.;Short Term: Perform resistance training exercises routinely during rehab and add in resistance training at home;Long Term: Improve cardiorespiratory fitness, muscular endurance and strength as measured by increased METs and functional capacity ( ) Short Term: Increase workloads from initial exercise prescription for resistance, speed, and METs.;Short Term: Perform resistance training exercises routinely during rehab and add in resistance training at home;Long Term: Improve cardiorespiratory fitness, muscular endurance and strength as measured by increased METs and functional capacity ( ) Short Term: Increase workloads from initial exercise prescription for resistance, speed, and METs.;Short Term: Perform resistance training exercises routinely during rehab and add in resistance training at home;Long Term: Improve cardiorespiratory fitness, muscular endurance and strength as measured by increased METs and functional capacity ( ) Short Term: Increase workloads from initial exercise prescription for resistance, speed, and METs.;Short Term: Perform resistance training exercises routinely during rehab and add in resistance training at home;Long Term: Improve cardiorespiratory fitness, muscular endurance and strength as measured by increased METs and functional capacity ( )    Able to understand and use rate of perceived exertion (RPE) scale Yes Yes Yes Yes    Intervention Provide education and explanation on how to use RPE scale Provide education and explanation on how to use RPE scale Provide education and explanation on how to use RPE scale Provide education and explanation on how to use RPE scale    Expected Outcomes Short Term: Able to use RPE daily in rehab  to express subjective intensity level;Long Term:  Able to use RPE to guide intensity level when exercising independently Short Term: Able  to use RPE daily in rehab to express subjective intensity level;Long Term:  Able to use RPE to guide intensity level when exercising independently Short Term: Able to use RPE daily in rehab to express subjective intensity level;Long Term:  Able to use RPE to guide intensity level when exercising independently Short Term: Able to use RPE daily in rehab to express subjective intensity level;Long Term:  Able to use RPE to guide intensity level when exercising independently    Able to understand and use Dyspnea scale Yes Yes Yes Yes    Intervention Provide education and explanation on how to use Dyspnea scale Provide education and explanation on how to use Dyspnea scale Provide education and explanation on how to use Dyspnea scale Provide education and explanation on how to use Dyspnea scale    Expected Outcomes Short Term: Able to use Dyspnea scale daily in rehab to express subjective sense of shortness of breath during exertion;Long Term: Able to use Dyspnea scale to guide intensity level when exercising independently Short Term: Able to use Dyspnea scale daily in rehab to express subjective sense of shortness of breath during exertion;Long Term: Able to use Dyspnea scale to guide intensity level when exercising independently Short Term: Able to use Dyspnea scale daily in rehab to express subjective sense of shortness of breath during exertion;Long Term: Able to use Dyspnea scale to guide intensity level when exercising independently Short Term: Able to use Dyspnea scale daily in rehab to express subjective sense of shortness of breath during exertion;Long Term: Able to use Dyspnea scale to guide intensity level when exercising independently    Knowledge and understanding of Target Heart Rate Range (THRR) Yes Yes Yes Yes    Intervention Provide education and explanation of THRR  including how the numbers were predicted and where they are located for reference Provide education and explanation of THRR including how the numbers were predicted and where they are located for reference Provide education and explanation of THRR including how the numbers were predicted and where they are located for reference Provide education and explanation of THRR including how the numbers were predicted and where they are located for reference    Expected Outcomes Short Term: Able to state/look up THRR;Short Term: Able to use daily as guideline for intensity in rehab;Long Term: Able to use THRR to govern intensity when exercising independently Short Term: Able to state/look up THRR;Short Term: Able to use daily as guideline for intensity in rehab;Long Term: Able to use THRR to govern intensity when exercising independently Short Term: Able to state/look up THRR;Short Term: Able to use daily as guideline for intensity in rehab;Long Term: Able to use THRR to govern intensity when exercising independently Short Term: Able to state/look up THRR;Short Term: Able to use daily as guideline for intensity in rehab;Long Term: Able to use THRR to govern intensity when exercising independently    Understanding of Exercise Prescription Yes Yes Yes Yes    Intervention Provide education, explanation, and written materials on patient's individual exercise prescription Provide education, explanation, and written materials on patient's individual exercise prescription Provide education, explanation, and written materials on patient's individual exercise prescription Provide education, explanation, and written materials on patient's individual exercise prescription    Expected Outcomes Short Term: Able to explain program exercise prescription;Long Term: Able to explain home exercise prescription to exercise independently Short Term: Able to explain program exercise prescription;Long Term: Able to explain home exercise  prescription to exercise independently Short Term: Able to explain program exercise prescription;Long  Term: Able to explain home exercise prescription to exercise independently Short Term: Able to explain program exercise prescription;Long Term: Able to explain home exercise prescription to exercise independently             Exercise Goals Re-Evaluation :  Exercise Goals Re-Evaluation     Row Name 01/18/23 0919 02/12/23 1217 03/13/23 1601 04/11/23 1025       Exercise Goal Re-Evaluation   Exercise Goals Review Increase Physical Activity;Able to understand and use Dyspnea scale;Understanding of Exercise Prescription;Increase Strength and Stamina;Knowledge and understanding of Target Heart Rate Range (THRR);Able to understand and use rate of perceived exertion (RPE) scale Increase Physical Activity;Able to understand and use Dyspnea scale;Understanding of Exercise Prescription;Increase Strength and Stamina;Knowledge and understanding of Target Heart Rate Range (THRR);Able to understand and use rate of perceived exertion (RPE) scale Increase Physical Activity;Able to understand and use Dyspnea scale;Understanding of Exercise Prescription;Increase Strength and Stamina;Knowledge and understanding of Target Heart Rate Range (THRR);Able to understand and use rate of perceived exertion (RPE) scale Increase Physical Activity;Able to understand and use Dyspnea scale;Understanding of Exercise Prescription;Increase Strength and Stamina;Knowledge and understanding of Target Heart Rate Range (THRR);Able to understand and use rate of perceived exertion (RPE) scale    Comments Pt is scheduled to begin exercise 8/13. Will progress as tolerated. Kaarina has completed 1 exercise session. She exercises for 15 min on the upright elliptical and treadmill. Brayah averages 4.3 METs at level 1 and speed 1 and 2.4 METs on the treadmill. She performs the warmup and cooldown standing without limitations. Irelyn has missed several  sessions due to her surgery. She plans to return next month at a earlier class time that will benefit her. Will continue to monitor and progress as able. Asianna has completed 6 exercise session. She exercises for 15 min on the upright elliptical and treadmill. Kirrah averages 4.0 METs at level 2 and speed 1 and 3.26 METs on the treadmill. She performs the warmup and cooldown standing without limitations. She has increased her workload for the upright elliptical as METs have remained relatively the same. Her workload has increased and decreased on the treadmill. Amarylis does not feel a difference since starting rehab. Will continue to monitor and progress as able. Amayah has completed 12 exercise sessions. She exercises for 15 min on the upright elliptical and treadmill. Merced averages 4.4 METs at level 2 and speed 3 and 3.3 METs at 2.7 mph and 1.5% incline on the treadmill. She performs the warmup and cooldown standing without limitations. She has increased her workload for both exercise modes as METs have increased. Ashonte tolerates progressions well. Will continue to monitor and progress as able.    Expected Outcomes Through exercise at rehab and home, the patient will decrease shortness of breath with daily activities and feel confident in carrying out an exercise regimen at home Through exercise at rehab and home, the patient will decrease shortness of breath with daily activities and feel confident in carrying out an exercise regimen at home Through exercise at rehab and home, the patient will decrease shortness of breath with daily activities and feel confident in carrying out an exercise regimen at home Through exercise at rehab and home, the patient will decrease shortness of breath with daily activities and feel confident in carrying out an exercise regimen at home             Discharge Exercise Prescription (Final Exercise Prescription Changes):  Exercise Prescription Changes - 04/17/23 0900  Response to Exercise   Blood Pressure (Admit) 128/72    Blood Pressure (Exercise) 114/80    Blood Pressure (Exit) 112/66    Heart Rate (Admit) 91 bpm    Heart Rate (Exercise) 114 bpm    Heart Rate (Exit) 96 bpm    Oxygen Saturation (Admit) 96 %    Oxygen Saturation (Exercise) 97 %    Oxygen Saturation (Exit) 97 %    Rating of Perceived Exertion (Exercise) 13    Perceived Dyspnea (Exercise) 1    Duration Continue with 30 min of aerobic exercise without signs/symptoms of physical distress.    Intensity THRR unchanged      Progression   Progression Continue to progress workloads to maintain intensity without signs/symptoms of physical distress.      Resistance Training   Training Prescription Yes    Weight black bands    Reps 10-15    Time 10 Minutes      Treadmill   MPH 2.7    Grade 1.5    Minutes 15    METs 3.3      Elliptical   Level 3    Speed 2    Minutes 15    METs 4.4             Nutrition:  Target Goals: Understanding of nutrition guidelines, daily intake of sodium 1500mg , cholesterol 200mg , calories 30% from fat and 7% or less from saturated fats, daily to have 5 or more servings of fruits and vegetables.  Biometrics:  Pre Biometrics - 01/12/23 0909       Pre Biometrics   Grip Strength 20 kg              Nutrition Therapy Plan and Nutrition Goals:  Nutrition Therapy & Goals - 04/12/23 0859       Nutrition Therapy   Diet General Healthy Diet      Personal Nutrition Goals   Nutrition Goal Patient to improve diet quality by using the plate method as a guide for meal planning to include lean protein/plant protein, fruits, vegetables, whole grains, nonfat dairy as part of a well-balanced diet.   goal in progress.   Personal Goal #2 Patient to identify strategies for weight loss of 0.5-2.0# per week.   goal in not met.   Comments Goals in progress. She did start Moujaro in April 2024 at 198#; she has maintained her weight since starting  with our program. Trinnity has started to transition to more plant based eating to aid with inflammation and lose weight. She reports struggling with weight for most of adulthood. She does report improved fasting/am blood sugars <100. We have discussed multiple strategies for weight loss including the plate method as a guide for meal planning/portion sizes, protein supplements, benefits of high fiber/high protein intake. Her A1c has improved to a pre-diabetic range; mounjaro was increased at 10/2 endocrinology appointment. She did not start novolog insulin or CGM at this time. Larue will benefit from participation in pulmonary rehab for nutrition, exercise, and lifestyle modification.      Intervention Plan   Intervention Prescribe, educate and counsel regarding individualized specific dietary modifications aiming towards targeted core components such as weight, hypertension, lipid management, diabetes, heart failure and other comorbidities.;Nutrition handout(s) given to patient.    Expected Outcomes Short Term Goal: Understand basic principles of dietary content, such as calories, fat, sodium, cholesterol and nutrients.;Long Term Goal: Adherence to prescribed nutrition plan.  Nutrition Assessments:  MEDIFICTS Score Key: >=70 Need to make dietary changes  40-70 Heart Healthy Diet <= 40 Therapeutic Level Cholesterol Diet   Picture Your Plate Scores: <35 Unhealthy dietary pattern with much room for improvement. 41-50 Dietary pattern unlikely to meet recommendations for good health and room for improvement. 51-60 More healthful dietary pattern, with some room for improvement.  >60 Healthy dietary pattern, although there may be some specific behaviors that could be improved.    Nutrition Goals Re-Evaluation:  Nutrition Goals Re-Evaluation     Row Name 01/30/23 1423 03/01/23 1542 04/12/23 0859         Goals   Current Weight 190 lb 7.6 oz (86.4 kg) 189 lb 6 oz (85.9 kg) 191 lb  12.8 oz (87 kg)     Comment A1c 6.6, history of elevated LFTS but WNL at this time no new labs; most recent labs A1c 6.6, history of elevated LFTS but WNL at this time A1c 5.7; history of elevated LFTS but WNL at this time     Expected Outcome Kimbra is motivated to transition to more plant based eating to aid with inflammation and lose weight. She reports struggling with weight for most of adulthood. She did start Moujaro in April 2024 at 198#. She does report improved fasting/am blood sugars 100-120s. She does report eating out for lunch more often. Vina will benefit from participation in pulmonary rehab for nutrition, exercise, and lifestyle modification. Goals in progress. Krystin has only completed three pulmonary rehab sessions due to other medical procedures/appointments. She did start Moujaro in April 2024 at 198#; she is down 3.3# since starting with our program. Shambra has started to transition to more plant based eating to aid with inflammation and lose weight. She reports struggling with weight for most of adulthood. She does report improved fasting/am blood sugars <100. We discussed multiple strategies for weight loss including the plate method as a guide for meal planning/portion sizes, protein supplements, benefits of high fiber/high protein intake. Lanissa will benefit from participation in pulmonary rehab for nutrition, exercise, and lifestyle modification. Goals in progress. She did start Moujaro in April 2024 at 198#; she has maintained her weight since starting with our program. Emilio has started to transition to more plant based eating to aid with inflammation and lose weight. She reports struggling with weight for most of adulthood. She does report improved fasting/am blood sugars <100. We have discussed multiple strategies for weight loss including the plate method as a guide for meal planning/portion sizes, protein supplements, benefits of high fiber/high protein intake. Her A1c has  improved to a pre-diabetic range; mounjaro was increased at 10/2 endocrinology appointment. She did not start novolog insulin or CGM at this time. Lali will benefit from participation in pulmonary rehab for nutrition, exercise, and lifestyle modification.              Nutrition Goals Discharge (Final Nutrition Goals Re-Evaluation):  Nutrition Goals Re-Evaluation - 04/12/23 0859       Goals   Current Weight 191 lb 12.8 oz (87 kg)    Comment A1c 5.7; history of elevated LFTS but WNL at this time    Expected Outcome Goals in progress. She did start Moujaro in April 2024 at 198#; she has maintained her weight since starting with our program. Karisa has started to transition to more plant based eating to aid with inflammation and lose weight. She reports struggling with weight for most of adulthood. She does report improved fasting/am blood sugars <100.  We have discussed multiple strategies for weight loss including the plate method as a guide for meal planning/portion sizes, protein supplements, benefits of high fiber/high protein intake. Her A1c has improved to a pre-diabetic range; mounjaro was increased at 10/2 endocrinology appointment. She did not start novolog insulin or CGM at this time. Jordan will benefit from participation in pulmonary rehab for nutrition, exercise, and lifestyle modification.             Psychosocial: Target Goals: Acknowledge presence or absence of significant depression and/or stress, maximize coping skills, provide positive support system. Participant is able to verbalize types and ability to use techniques and skills needed for reducing stress and depression.  Initial Review & Psychosocial Screening:  Initial Psych Review & Screening - 01/12/23 0922       Initial Review   Current issues with None Identified      Family Dynamics   Good Support System? Yes    Comments Keysi states she has good support from her spouse      Barriers   Psychosocial  barriers to participate in program There are no identifiable barriers or psychosocial needs.             Quality of Life Scores:  Scores of 19 and below usually indicate a poorer quality of life in these areas.  A difference of  2-3 points is a clinically meaningful difference.  A difference of 2-3 points in the total score of the Quality of Life Index has been associated with significant improvement in overall quality of life, self-image, physical symptoms, and general health in studies assessing change in quality of life.  PHQ-9: Review Flowsheet       01/12/2023 12/15/2019 11/19/2018  Depression screen PHQ 2/9  Decreased Interest 0 3 0  Down, Depressed, Hopeless 0 2 0  PHQ - 2 Score 0 5 0  Altered sleeping 0 3 1  Tired, decreased energy 0 3 1  Change in appetite 0 3 0  Feeling bad or failure about yourself  0 0 0  Trouble concentrating 0 0 0  Moving slowly or fidgety/restless 0 1 0  Suicidal thoughts 0 0 0  PHQ-9 Score 0 15 2  Difficult doing work/chores - Not difficult at all -    Details           Interpretation of Total Score  Total Score Depression Severity:  1-4 = Minimal depression, 5-9 = Mild depression, 10-14 = Moderate depression, 15-19 = Moderately severe depression, 20-27 = Severe depression   Psychosocial Evaluation and Intervention:  Psychosocial Evaluation - 01/12/23 0926       Psychosocial Evaluation & Interventions   Comments Xochilt denies any psychosocial barriers or concerns    Expected Outcomes For Anayeli to participate in PR without any psychosocial barriers or concerns    Continue Psychosocial Services  No Follow up required             Psychosocial Re-Evaluation:  Psychosocial Re-Evaluation     Row Name 01/19/23 1457 02/12/23 1334 03/12/23 0924 04/12/23 1537       Psychosocial Re-Evaluation   Current issues with None Identified None Identified None Identified None Identified    Comments No changes since Serenah's orientation  appointment. Conor will start the program on 8/13/20242 Jillienne has only attended one session. She is scheduled to return to class on 9/10 due to medical procedure. No new psychosocial barriers or concers. Brynlynn denies any psychosocial barriers or concerns. She visits the beach and exercises  with her husband for stress relief. Melisia denies any psychosocial barriers or concerns at this time    Expected Outcomes For Vernona to participate in RP without any psychosocial barriers or concerns For Mckinnley to participate in RP without any psychosocial barriers or concerns For Sadie to participate in PR without any psychosocial barriers or concerns For Jenavive to participate in PR without any psychosocial barriers or concerns    Interventions Encouraged to attend Pulmonary Rehabilitation for the exercise Encouraged to attend Pulmonary Rehabilitation for the exercise Encouraged to attend Pulmonary Rehabilitation for the exercise Encouraged to attend Pulmonary Rehabilitation for the exercise    Continue Psychosocial Services  No Follow up required No Follow up required No Follow up required No Follow up required             Psychosocial Discharge (Final Psychosocial Re-Evaluation):  Psychosocial Re-Evaluation - 04/12/23 1537       Psychosocial Re-Evaluation   Current issues with None Identified    Comments Zalea denies any psychosocial barriers or concerns at this time    Expected Outcomes For Vallen to participate in PR without any psychosocial barriers or concerns    Interventions Encouraged to attend Pulmonary Rehabilitation for the exercise    Continue Psychosocial Services  No Follow up required             Education: Education Goals: Education classes will be provided on a weekly basis, covering required topics. Participant will state understanding/return demonstration of topics presented.  Learning Barriers/Preferences:  Learning Barriers/Preferences - 01/12/23 1002       Learning  Barriers/Preferences   Learning Barriers None    Learning Preferences Individual Instruction;Written Material;Verbal Instruction             Education Topics: Know Your Numbers Group instruction that is supported by a PowerPoint presentation. Instructor discusses importance of knowing and understanding resting, exercise, and post-exercise oxygen saturation, heart rate, and blood pressure. Oxygen saturation, heart rate, blood pressure, rating of perceived exertion, and dyspnea are reviewed along with a normal range for these values.  Flowsheet Row PULMONARY REHAB OTHER RESPIRATORY from 03/22/2023 in [facility] for Heart, Vascular, & Lung Health  Date 03/22/23  Educator EP  Instruction Review Code 1- Verbalizes Understanding       Exercise for the Pulmonary Patient Group instruction that is supported by a PowerPoint presentation. Instructor discusses benefits of exercise, core components of exercise, frequency, duration, and intensity of an exercise routine, importance of utilizing pulse oximetry during exercise, safety while exercising, and options of places to exercise outside of rehab.  Flowsheet Row PULMONARY REHAB OTHER RESPIRATORY from 03/15/2023 in [facility] for Heart, Vascular, & Lung Health  Date 03/15/23  Educator eP  Instruction Review Code 1- Verbalizes Understanding       MET Level  Group instruction provided by PowerPoint, verbal discussion, and written material to support subject matter. Instructor reviews what METs are and how to increase METs.    Pulmonary Medications Verbally interactive group education provided by instructor with focus on inhaled medications and proper administration. Flowsheet Row PULMONARY REHAB OTHER RESPIRATORY from 03/08/2023 in [facility] for Heart, Vascular, & Lung Health  Date 03/08/23  Educator RT  Instruction Review Code 1- Verbalizes Understanding        Anatomy and Physiology of the Respiratory System Group instruction provided by PowerPoint, verbal discussion, and written material to support subject matter. Instructor reviews respiratory cycle and anatomical components of the  respiratory system and their functions. Instructor also reviews differences in obstructive and restrictive respiratory diseases with examples of each.  Flowsheet Row PULMONARY REHAB OTHER RESPIRATORY from 03/01/2023 in [facility] for Heart, Vascular, & Lung Health  Date 03/01/23  Educator RT  Instruction Review Code 1- Verbalizes Understanding       Oxygen Safety Group instruction provided by PowerPoint, verbal discussion, and written material to support subject matter. There is an overview of "What is Oxygen" and "Why do we need it".  Instructor also reviews how to create a safe environment for oxygen use, the importance of using oxygen as prescribed, and the risks of noncompliance. There is a brief discussion on traveling with oxygen and resources the patient may utilize. Flowsheet Row PULMONARY REHAB OTHER RESPIRATORY from 03/29/2023 in [facility] for Heart, Vascular, & Lung Health  Date 03/29/23  Educator RN  Instruction Review Code 1- Verbalizes Understanding       Oxygen Use Group instruction provided by PowerPoint, verbal discussion, and written material to discuss how supplemental oxygen is prescribed and different types of oxygen supply systems. Resources for more information are provided.  Flowsheet Row PULMONARY REHAB OTHER RESPIRATORY from 04/05/2023 in [facility] for Heart, Vascular, & Lung Health  Date 04/05/23  Educator RT  Instruction Review Code 1- Verbalizes Understanding       Breathing Techniques Group instruction that is supported by demonstration and informational handouts. Instructor discusses the benefits of pursed lip and diaphragmatic breathing and detailed  demonstration on how to perform both.  Flowsheet Row PULMONARY REHAB OTHER RESPIRATORY from 04/12/2023 in [facility] for Heart, Vascular, & Lung Health  Date 04/12/23  Educator RN  Instruction Review Code 1- Verbalizes Understanding        Risk Factor Reduction Group instruction that is supported by a PowerPoint presentation. Instructor discusses the definition of a risk factor, different risk factors for pulmonary disease, and how the heart and lungs work together.   Pulmonary Diseases Group instruction provided by PowerPoint, verbal discussion, and written material to support subject matter. Instructor gives an overview of the different type of pulmonary diseases. There is also a discussion on risk factors and symptoms as well as ways to manage the diseases.   Stress and Energy Conservation Group instruction provided by PowerPoint, verbal discussion, and written material to support subject matter. Instructor gives an overview of stress and the impact it can have on the body. Instructor also reviews ways to reduce stress. There is also a discussion on energy conservation and ways to conserve energy throughout the day.   Warning Signs and Symptoms Group instruction provided by PowerPoint, verbal discussion, and written material to support subject matter. Instructor reviews warning signs and symptoms of stroke, heart attack, cold and flu. Instructor also reviews ways to prevent the spread of infection.   Other Education Group or individual verbal, written, or video instructions that support the educational goals of the pulmonary rehab program.    Knowledge Questionnaire Score:  Knowledge Questionnaire Score - 01/12/23 1002       Knowledge Questionnaire Score   Pre Score 17/18             Core Components/Risk Factors/Patient Goals at Admission:  Personal Goals and Risk Factors at Admission - 01/12/23 0919       Core Components/Risk Factors/Patient  Goals on Admission    Weight Management Weight Loss;Yes    Intervention Weight Management: Develop a  combined nutrition and exercise program designed to reach desired caloric intake, while maintaining appropriate intake of nutrient and fiber, sodium and fats, and appropriate energy expenditure required for the weight goal.;Weight Management: Provide education and appropriate resources to help participant work on and attain dietary goals.;Weight Management/Obesity: Establish reasonable short term and long term weight goals.;Obesity: Provide education and appropriate resources to help participant work on and attain dietary goals.    Admit Weight 192 lb 9.6 oz (87.4 kg)    Improve shortness of breath with ADL's Yes    Intervention Provide education, individualized exercise plan and daily activity instruction to help decrease symptoms of SOB with activities of daily living.    Expected Outcomes Short Term: Improve cardiorespiratory fitness to achieve a reduction of symptoms when performing ADLs;Long Term: Be able to perform more ADLs without symptoms or delay the onset of symptoms    Increase knowledge of respiratory medications and ability to use respiratory devices properly  Yes    Intervention Provide education and demonstration as needed of appropriate use of medications, inhalers, and oxygen therapy.    Expected Outcomes Short Term: Achieves understanding of medications use. Understands that oxygen is a medication prescribed by physician. Demonstrates appropriate use of inhaler and oxygen therapy.;Long Term: Maintain appropriate use of medications, inhalers, and oxygen therapy.             Core Components/Risk Factors/Patient Goals Review:   Goals and Risk Factor Review     Row Name 01/22/23 1551 02/12/23 1335 03/12/23 0928 04/12/23 1537       Core Components/Risk Factors/Patient Goals Review   Personal Goals Review Improve shortness of breath with ADL's;Develop more efficient breathing  techniques such as purse lipped breathing and diaphragmatic breathing and practicing self-pacing with activity.;Increase knowledge of respiratory medications and ability to use respiratory devices properly.;Weight Management/Obesity Weight Management/Obesity;Improve shortness of breath with ADL's;Develop more efficient breathing techniques such as purse lipped breathing and diaphragmatic breathing and practicing self-pacing with activity.;Increase knowledge of respiratory medications and ability to use respiratory devices properly. Weight Management/Obesity;Improve shortness of breath with ADL's;Develop more efficient breathing techniques such as purse lipped breathing and diaphragmatic breathing and practicing self-pacing with activity.;Increase knowledge of respiratory medications and ability to use respiratory devices properly. Weight Management/Obesity;Improve shortness of breath with ADL's    Review Unable to assess, Rakiyah has not yet started the program but is scheduled to start on 01/30/2023 Adalyn has attended one session so far. She is scheduled to return to class on 9/10. Goals are in progress. Goal in progress for weight loss. Marlenie has not had success yet in losing weight but is still working with our dietician. She hopes that exercising will help. She has just started her journey in class and has completed 5 sessions so far. Goal met on developing more efficient breathing techniques such as purse lipped breathing and diaphragmatic breathing; and practicing self-pacing with activity. Natesha can initiate PLB and can self-pace herself while on the elliptical and treadmill. Goal in progress on improving her shortness of breath with ADLs. She has increased both her workload and METs while maintaining her oxygen saturation >88% on room air. Goal met for increasing her knowledge of respiratory medications and ability to use respiratory devices properly. She has correctly demonstrated and voiced when to use  her medications with our respiratory therapist. Meridel will continue to benefit from PR for nutrition, education, exercise, and lifestyle modification. Goal in progress for weight loss. Noelia has not had success yet in losing weight but  is still working with our dietician. Goal in progress on improving her shortness of breath with ADLs. She has increased both her workload and METs while maintaining her oxygen saturation >88% on room air. Mahasin will continue to benefit from PR for nutrition, education, exercise, and lifestyle modification.    Expected Outcomes See admission goals See admission goals For Dakotta to lose weight and improve her SOB with ADLs For Jolicia to lose weight and improve her SOB with ADLs             Core Components/Risk Factors/Patient Goals at Discharge (Final Review):   Goals and Risk Factor Review - 04/12/23 1537       Core Components/Risk Factors/Patient Goals Review   Personal Goals Review Weight Management/Obesity;Improve shortness of breath with ADL's    Review Goal in progress for weight loss. Kaylanii has not had success yet in losing weight but is still working with our dietician. Goal in progress on improving her shortness of breath with ADLs. She has increased both her workload and METs while maintaining her oxygen saturation >88% on room air. Dinora will continue to benefit from PR for nutrition, education, exercise, and lifestyle modification.    Expected Outcomes For Morag to lose weight and improve her SOB with ADLs             ITP Comments:Pt is making expected progress toward Pulmonary Rehab goals after completing 14 sessions. Recommend continued exercise, life style modification, education, and utilization of breathing techniques to increase stamina and strength, while also decreasing shortness of breath with exertion.  Dr. Mechele Collin is Medical Director for Pulmonary Rehab at [facility].

## 2023-04-19 ENCOUNTER — Encounter (HOSPITAL_COMMUNITY)
Admission: RE | Admit: 2023-04-19 | Discharge: 2023-04-19 | Disposition: A | Discharge status: Home / Self Care | Payer: BC Managed Care – PPO | Source: Ambulatory Visit | Attending: Pulmonary Disease | Admitting: Pulmonary Disease

## 2023-04-19 DIAGNOSIS — D869 Sarcoidosis, unspecified: Secondary | ICD-10-CM | POA: Diagnosis not present

## 2023-04-19 NOTE — Progress Notes (Signed)
57 y.o. G30P2002 Married Caucasian female here for annual exam.    Her perineum is better but it does feel raw following her punch biopsy and then wound separation following intercourse.  She is using vaginal estrogen cream three times a week.  Not using any other medication.  Not having intercourse.   Does have some discomfort in the area with digital contact.   Has introital sensitivity.   Denies vaginal bleeding.   Her vaginal estrogen was first started when she had a mesh erosion following her midurethral sling procedure with urogynecology.  This is now healed.   Patient does report uterine contraction like pain with intercourse/orgasm.   Patient has sarcoidosis and will do an upcoming PET scan.  She feels fatigue related to the sarcoidosis.   PCP: Hilbert Bible, FNP   No LMP recorded. Patient has had an ablation.           Sexually active: Yes.    The current method of family planning is ablation/vasectomy.    Exercising: Yes.     Weights, pulmonary rehab Smoker:  no  OB History  Gravida Para Term Preterm AB Living  2 2 2     2   SAB IAB Ectopic Multiple Live Births          2    # Outcome Date GA Lbr Len/2nd Weight Sex Type Anes PTL Lv  2 Term      Vag-Spont     1 Term      Vag-Vacuum        Health Maintenance: Pap:  11/28/17 neg: HR HPV neg, 11/26/14 neg History of abnormal Pap:  no MMG: 12/11/22 Breast Density Cat A, BI-RADS CAT 2 benign.  She is scheduled for this month for her screening mammogram at [facility].  Colonoscopy:   [facility] Colonoscopy          Colonoscopy (Every 10 Years) Next due on 11/30/2026    11/29/2016  [facility] COLONOSCOPY   Only the first 1 history entries have been loaded, but more history exists.           BMD:  has appointment this month at [facility].  HIV: neg in preg Hep C: 03/23/21 NR  Immunization History  Administered Date(s) Administered   PFIZER(Purple Top)SARS-COV-2 Vaccination 11/05/2019   Tdap 06/04/2007, 03/08/2018       reports that she has never smoked. She has never been exposed to tobacco smoke. She has never used smokeless tobacco. She reports that she does not drink alcohol and does not use drugs.  Past Medical History:  Diagnosis Date   Back pain    Constipation    Contact dermatitis 02/11/2021   Elevated LFTs 12/12/2018   Fatty liver    Fen-phen history 11/24/2018   Generalized anxiety disorder 09/25/2022   Hypercholesteremia    Hypertension    Intestinal bacterial overgrowth    Joint pain    Kidney stones    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   Lumbar pain 07/12/2021   Lymphadenopathy 06/13/2022   Migraines    Mild cognitive impairment 02/02/2023   Mixed hyperlipidemia 12/12/2018   Morbid obesity 11/24/2018   Myalgia, unspecified site 06/21/2021   Nausea in adult    Obstructive sleep apnea    mild, no CPAP   Other allergic rhinitis 04/04/2022   Pain in thoracic spine 07/18/2021   Sarcoidosis    Sleep disturbance 10/08/2019   Splenic lesion 05/20/2022   Subclinical hypothyroidism 09/2009   Type 2 diabetes  mellitus without complication, without long-term current use of insulin 05/05/2022    Past Surgical History:  Procedure Laterality Date   BLADDER SUSPENSION N/A 06/05/2022   Procedure: TRANSVAGINAL TAPE (TVT) PROCEDURE;  Surgeon: Marguerita Beards, MD;  Location: [facility];  Service: Gynecology;  Laterality: N/A;  total time requested is 1 hour   COLONOSCOPY     CYSTOSCOPY N/A 06/05/2022   Procedure: CYSTOSCOPY;  Surgeon: Marguerita Beards, MD;  Location: [facility];  Service: Gynecology;  Laterality: N/A;   LITHOTRIPSY     LYMPH NODE DISSECTION     NOVASURE ABLATION  12/18/2006   RADIOACTIVE SEED GUIDED EXCISIONAL BREAST BIOPSY Right 05/30/2021   Procedure: RADIOACTIVE SEED GUIDED EXCISIONAL RIGHT BREAST BIOPSY;  Surgeon: Emelia Loron, MD;  Location: [facility];  Service: General;  Laterality: Right;   ROTATOR CUFF REPAIR  Right 06/20/2007    Current Outpatient Medications  Medication Sig Dispense Refill   aspirin EC 81 MG tablet Take 81 mg by mouth daily. Swallow whole.     Budeson-Glycopyrrol-Formoterol (BREZTRI AEROSPHERE) 160-9-4.8 MCG/ACT AERO Inhale 2 puffs into the lungs in the morning and at bedtime. 10.7 g 5   Coenzyme Q10 (COQ-10 PO) Take by mouth.     EYSUVIS 0.25 % SUSP Apply to eye.     folic acid (FOLVITE) 1 MG tablet Take 2 tablets (2 mg total) by mouth daily. 180 tablet 11   GARLIC PO Take by mouth.     MAGNESIUM PO Take by mouth.     metFORMIN (GLUCOPHAGE-XR) 500 MG 24 hr tablet Take 2 tablets (1,000 mg total) by mouth daily before supper. 180 tablet 3   methotrexate (RHEUMATREX) 2.5 MG tablet Take 8 tablets (20 mg total) by mouth once a week. 96 tablet 1   omeprazole (PRILOSEC) 20 MG capsule Take 1 capsule (20 mg total) by mouth daily. 90 capsule 5   ondansetron (ZOFRAN) 4 MG tablet Take 1 tablet (4 mg total) by mouth daily at 2 PM. 30 tablet 0   predniSONE (DELTASONE) 5 MG tablet Take 2 tablets (10 mg total) by mouth daily with breakfast. 60 tablet 2   tirzepatide (MOUNJARO) 7.5 MG/0.5ML Pen Inject 7.5 mg into the skin once a week. 6 mL 3   Vibegron 75 MG TABS Take 1 tablet (75 mg total) by mouth daily. 90 tablet 3   No current facility-administered medications for this visit.    Family History  Problem Relation Age of Onset   Diabetes Mother    Hyperlipidemia Mother    Thyroid disease Mother    Anxiety disorder Mother    Obesity Mother    Aortic stenosis Mother    Arthritis-Osteo Sister    Colon cancer Paternal Grandmother    Heart disease Paternal Grandfather    Pancreatic cancer Neg Hx    Stomach cancer Neg Hx    Liver disease Neg Hx    Esophageal cancer Neg Hx     Review of Systems  All other systems reviewed and are negative.   Exam:   BP 118/82   Pulse 84   Resp 12   Ht 5' 1.75" (1.568 m)   Wt 189 lb (85.7 kg)   BMI 34.85 kg/m     General appearance: alert,  cooperative and appears stated age Head: normocephalic, without obvious abnormality, atraumatic Neck: no adenopathy, supple, symmetrical, trachea midline and thyroid normal to inspection and palpation Lungs: clear to auscultation bilaterally Breasts: normal appearance, no masses or tenderness,  No nipple retraction or dimpling, No nipple discharge or bleeding, No axillary adenopathy Heart: regular rate and rhythm Abdomen: soft, non-tender; no masses, no organomegaly Extremities: extremities normal, atraumatic, no cyanosis or edema Skin: skin color, texture, turgor normal. No rashes or lesions Lymph nodes: cervical, supraclavicular, and axillary nodes normal. Neurologic: grossly normal  Pelvic: External genitalia:  no lesions              No abnormal inguinal nodes palpated.              Urethra:  normal appearing urethra with no masses, tenderness or lesions              Bartholins and Skenes: normal                 Vagina: normal appearing vagina with normal color and discharge, no lesions              Cervix: no lesions              Pap taken: yes Bimanual Exam:  Uterus:  normal size, contour, position, consistency, mobility, non-tender              Adnexa: no mass, fullness, tenderness              Rectal exam: yes.  Confirms.              Anus:  normal sphincter tone, no lesions  Chaperone was present for exam:  Warren Lacy, CMA   Assessment:  Well woman with GYN exam. Status post endometrial ablation.   Cervical cancer screening.  Status post midurethral sling.  Overactive bladder.  On Gemtesa.  Separation of wound from office perineal punch biopsy done 01/30/23:  acute and chronic inflammation with ulceration. The separation happened with intercourse.  Well healed now. Hx lichen planus of the vulva.  Biopsy in 2018.  Dyspareunia. Uterine contraction pain with intercourse/orgasm.  Breast atypia. HSV I.  Cold sores.  Has plenty of Valtrex.  Sarcoidosis.   Plan: Mammogram  screening discussed. Self breast awareness reviewed. Pap and HR HPV collected. Guidelines for Calcium, Vitamin D, regular exercise program including cardiovascular and weight bearing exercise. Continue vaginal estrogen cream short term, through end of this year.  Then try OTC vaginal vit E. Motrin 800 mg one hour prior to sex.  Rx for lidocaine to apply to the introitus one hour prior to sex Rx for Mycolocg II for vulvar irritation.  Fu yearly and prn.

## 2023-04-19 NOTE — Progress Notes (Signed)
Daily Session Note  Patient Details  Name: GENASIS BARSZCZ MRN: [id number] Date of Birth: 05-Jul-1965 Referring Provider:   Doristine Devoid Pulmonary Rehab Walk Test from 01/12/2023 in [facility]  Referring Provider [name]       Encounter Date: 04/19/2023  Check In:  Session Check In - 04/19/23 0904       Check-In   Supervising physician immediately available to respond to emergencies [facility] MD immediately available    Physician(s) Carlos Levering, NP    Location [facility]    Staff Present Essie Hart, RN, Doris Cheadle, MS, ACSM-CEP, Exercise Physiologist;Casey Katrinka Blazing, RT    Virtual Visit No    Medication changes reported     No    Fall or balance concerns reported    No    Tobacco Cessation No Change    Warm-up and Cool-down Performed as group-led instruction    Resistance Training Performed Yes    VAD Patient? No    PAD/SET Patient? No      Pain Assessment   Currently in Pain? No/denies    Multiple Pain Sites No             Capillary Blood Glucose: No results found for this or any previous visit (from the past 24 hour(s)).    Social History   Tobacco Use  Smoking Status Never   Passive exposure: Never  Smokeless Tobacco Never    Goals Met:  Proper associated with RPD/PD & O2 Sat Independence with exercise equipment Exercise tolerated well No report of concerns or symptoms today Strength training completed today  Goals Unmet:  Not Applicable  Comments: Service time is from 0810 to 0927.    Dr. Mechele Collin is Medical Director for Pulmonary Rehab at [facility].

## 2023-04-23 ENCOUNTER — Encounter: Payer: BC Managed Care – PPO | Admitting: Psychology

## 2023-04-24 ENCOUNTER — Encounter (HOSPITAL_COMMUNITY)
Admission: RE | Admit: 2023-04-24 | Discharge: 2023-04-24 | Disposition: A | Discharge status: Home / Self Care | Payer: BC Managed Care – PPO | Source: Ambulatory Visit | Attending: Pulmonary Disease | Admitting: Pulmonary Disease

## 2023-04-24 DIAGNOSIS — Z124 Encounter for screening for malignant neoplasm of cervix: Secondary | ICD-10-CM | POA: Insufficient documentation

## 2023-04-24 DIAGNOSIS — D869 Sarcoidosis, unspecified: Secondary | ICD-10-CM | POA: Insufficient documentation

## 2023-04-24 NOTE — Progress Notes (Signed)
Daily Session Note  Patient Details  Name: [name] MRN: [id number] Date of Birth: 1965-08-05 Referring Provider:   Doristine Devoid Pulmonary Rehab Walk Test from 01/12/2023 in [facility]  Referring Provider [name]       Encounter Date: 04/24/2023  Check In:  Session Check In - 04/24/23 0824       Check-In   Supervising physician immediately available to respond to emergencies [facility] MD immediately available    Physician(s) Carlos Levering, NP    Location [facility]    Staff Present Essie Hart, RN, Doris Cheadle, MS, ACSM-CEP, Exercise Physiologist;Breon Diss Erin Sons BS, ACSM-CEP, Exercise Physiologist    Virtual Visit No    Medication changes reported     No    Fall or balance concerns reported    No    Tobacco Cessation No Change    Warm-up and Cool-down Performed as group-led instruction    Resistance Training Performed Yes    VAD Patient? No    PAD/SET Patient? No      Pain Assessment   Currently in Pain? No/denies    Multiple Pain Sites No             Capillary Blood Glucose: No results found for this or any previous visit (from the past 24 hour(s)).    Social History   Tobacco Use  Smoking Status Never   Passive exposure: Never  Smokeless Tobacco Never    Goals Met:  Proper associated with RPD/PD & O2 Sat Independence with exercise equipment Exercise tolerated well No report of concerns or symptoms today Strength training completed today  Goals Unmet:  Not Applicable  Comments: Service time is from 0811 to 0912.    Dr. Mechele Collin is Medical Director for Pulmonary Rehab at [facility].

## 2023-04-26 ENCOUNTER — Encounter (HOSPITAL_COMMUNITY)
Admission: RE | Admit: 2023-04-26 | Discharge: 2023-04-26 | Disposition: A | Discharge status: Home / Self Care | Payer: BC Managed Care – PPO | Source: Ambulatory Visit | Attending: Pulmonary Disease | Admitting: Pulmonary Disease

## 2023-04-26 ENCOUNTER — Ambulatory Visit (HOSPITAL_BASED_OUTPATIENT_CLINIC_OR_DEPARTMENT_OTHER): Payer: BC Managed Care – PPO | Admitting: Family Medicine

## 2023-04-26 ENCOUNTER — Encounter (HOSPITAL_BASED_OUTPATIENT_CLINIC_OR_DEPARTMENT_OTHER): Payer: Self-pay | Admitting: Family Medicine

## 2023-04-26 VITALS — BP 126/67 | HR 89 | Ht 61.0 in | Wt 189.8 lb

## 2023-04-26 DIAGNOSIS — E119 Type 2 diabetes mellitus without complications: Secondary | ICD-10-CM

## 2023-04-26 DIAGNOSIS — E782 Mixed hyperlipidemia: Secondary | ICD-10-CM | POA: Diagnosis not present

## 2023-04-26 DIAGNOSIS — Z7689 Persons encountering health services in other specified circumstances: Secondary | ICD-10-CM | POA: Diagnosis not present

## 2023-04-26 DIAGNOSIS — D869 Sarcoidosis, unspecified: Secondary | ICD-10-CM

## 2023-04-26 DIAGNOSIS — G479 Sleep disorder, unspecified: Secondary | ICD-10-CM

## 2023-04-26 NOTE — Progress Notes (Addendum)
New Patient Office Visit  Subjective:   [name] 07-28-1965 04/26/2023  Chief Complaint  Patient presents with   Establish Care    HPI: [name] presents today to establish care at Primary Care and Sports Medicine at [facility]. Introduced to Publishing rights manager role and practice setting.  All questions answered.   Last PCP: [facility]  Last annual physical: 1 year ago  Concerns: See below   DIABETES MELLITUS: [name] presents for the medical management of diabetes.  Patient currently managed by endocrinology.  Reports fluctuations in her blood sugars due to prednisone from treatment of sarcoidosis. Current diabetes medication regimen: Metformin 1000 mg twice daily XR, Mounjaro 7.5 mg weekly IM Patient is  adhering to a diabetic diet.  Patient is exercising regularly: [facility] per [facility] Currently in [facility] as well.  Patient is  checking BS regularly. Avg: 120-200 on average  Patient is  checking their feet regularly.  Denies polydipsia, polyphagia, polyuria, open wounds or ulcers on feet.   Lab Results  Component Value Date   HGBA1C 5.7 (A) 03/21/2023    No foot exam found Lab Results  Component Value Date   MICROALBUR <0.7 03/21/2023    Wt Readings from Last 3 Encounters:  04/26/23 189 lb 12.8 oz (86.1 kg)  04/17/23 190 lb 11.2 oz (86.5 kg)  04/03/23 190 lb 11.2 oz (86.5 kg)    HYPERLIPIDEMIA: [name] presents for the medical management of hyperlipidemia.  Patient's current HLD regimen is: None Patient is not currently taking prescribed medications for HLD.  Adhering to heathy diet: Yes Exercising regularly: No  Most recent labs obtained by [facility] on March 29, 2023 with results below:  Cholesterol, Total 269 High  mg/dL 413-244 Triglycerides 010  mg/dL 2-725 HDL Cholesterol  94   mg/dL >36 VLDL Cholesterol Cal 25  mg/dL 6-44 LDL Chol Calc (NIH) 150  mg/dL 0-34  Lab Results  Component Value  Date   CHOL 261 (H) 12/15/2019   HDL 73 12/15/2019   LDLCALC 160 (H) 12/15/2019   TRIG 159 (H) 12/15/2019   CHOLHDL 3.6 12/15/2019   ASCVD Risk: 10 year: 3.8%,  Lifetime Risk 50%.    INSOMNIA:  She was having issues due to higher levels of prednisone.  She states since decrease dosage of prednisone her sleep has slightly improved.  She is having issues with staying asleep and falling asleep.  She is currently taking sleep 3 over-the-counter supplement and has noticed improvement in his ability to fall and stay asleep.  The following portions of the patient's history were reviewed and updated as appropriate: past medical history, past surgical history, family history, social history, allergies, medications, and problem list.   Patient Active Problem List   Diagnosis Date Noted   Paresthesias 04/03/2023   Mild cognitive impairment 02/02/2023   Migraines    Hypercholesteremia    Generalized anxiety disorder 09/25/2022   Hypertension 09/25/2022   High risk medication use 09/25/2022   Sarcoidosis    Lymphadenopathy 06/13/2022   Splenic lesion 05/20/2022   Type 2 diabetes mellitus without complication, without long-term current use of insulin 05/05/2022   Other allergic rhinitis 04/04/2022   Pain in thoracic spine 07/18/2021   Lumbar pain 07/12/2021   Myalgia, unspecified site 06/21/2021   Contact dermatitis 02/11/2021   Sleep disturbance 10/08/2019   Elevated LFTs 12/12/2018   Mixed hyperlipidemia 12/12/2018   Fen-phen history 11/24/2018   Obstructive sleep apnea 11/24/2018  Morbid obesity 11/24/2018   Past Medical History:  Diagnosis Date   Back pain    Constipation    Contact dermatitis 02/11/2021   Elevated LFTs 12/12/2018   Fatty liver    Fen-phen history 11/24/2018   Generalized anxiety disorder 09/25/2022   Hypercholesteremia    Hypertension    Intestinal bacterial overgrowth    Joint pain    Kidney stones    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   Lumbar pain  07/12/2021   Lymphadenopathy 06/13/2022   Migraines    Mild cognitive impairment 02/02/2023   Mixed hyperlipidemia 12/12/2018   Morbid obesity 11/24/2018   Myalgia, unspecified site 06/21/2021   Nausea in adult    Obstructive sleep apnea    mild, no CPAP   Other allergic rhinitis 04/04/2022   Pain in thoracic spine 07/18/2021   Sarcoidosis    Sleep disturbance 10/08/2019   Splenic lesion 05/20/2022   Subclinical hypothyroidism 09/2009   Type 2 diabetes mellitus without complication, without long-term current use of insulin 05/05/2022   Past Surgical History:  Procedure Laterality Date   BLADDER SUSPENSION N/A 06/05/2022   Procedure: TRANSVAGINAL TAPE (TVT) PROCEDURE;  Surgeon: Marguerita Beards, MD;  Location: [facility];  Service: Gynecology;  Laterality: N/A;  total time requested is 1 hour   COLONOSCOPY     CYSTOSCOPY N/A 06/05/2022   Procedure: CYSTOSCOPY;  Surgeon: Marguerita Beards, MD;  Location: [facility];  Service: Gynecology;  Laterality: N/A;   LITHOTRIPSY     LYMPH NODE DISSECTION     NOVASURE ABLATION  12/18/2006   RADIOACTIVE SEED GUIDED EXCISIONAL BREAST BIOPSY Right 05/30/2021   Procedure: RADIOACTIVE SEED GUIDED EXCISIONAL RIGHT BREAST BIOPSY;  Surgeon: Emelia Loron, MD;  Location: [facility];  Service: General;  Laterality: Right;   ROTATOR CUFF REPAIR Right 06/20/2007   Family History  Problem Relation Age of Onset   Diabetes Mother    Hyperlipidemia Mother    Thyroid disease Mother    Anxiety disorder Mother    Obesity Mother    Aortic stenosis Mother    Arthritis-Osteo Sister    Colon cancer Paternal Grandmother    Heart disease Paternal Grandfather    Pancreatic cancer Neg Hx    Stomach cancer Neg Hx    Liver disease Neg Hx    Esophageal cancer Neg Hx    Social History   Socioeconomic History   Marital status: Married    Spouse name: [name]   Number of children: 2   Years of  education: 16   Highest education level: Bachelor's degree (e.g., BA, AB, BS)  Occupational History   Occupation: IT trainer  Tobacco Use   Smoking status: Never    Passive exposure: Never   Smokeless tobacco: Never  Vaping Use   Vaping status: Never Used  Substance and Sexual Activity   Alcohol use: No    Alcohol/week: 0.0 standard drinks of alcohol   Drug use: No   Sexual activity: Yes    Partners: Male    Birth control/protection: Surgical    Comment: Ablation, Vasectomy  Other Topics Concern   Not on file  Social History Narrative   Right handed   Drinks coffee   Lives with husband   Two story home   unemployed   Social Determinants of Health   Financial Resource Strain: Low Risk  (04/23/2023)   Overall Financial Resource Strain (CARDIA)    Difficulty of Paying Living Expenses: Not hard at  all  Food Insecurity: No Food Insecurity (04/23/2023)   Hunger Vital Sign    Worried About Running Out of Food in the Last Year: Never true    Ran Out of Food in the Last Year: Never true  Transportation Needs: No Transportation Needs (04/23/2023)   PRAPARE - Administrator, Civil Service (Medical): No    Lack of Transportation (Non-Medical): No  Physical Activity: Unknown (04/23/2023)   Exercise Vital Sign    Days of Exercise per Week: 3 days    Minutes of Exercise per Session: Not on file  Stress: No Stress Concern Present (04/23/2023)   [employer] of Occupational Health - Occupational Stress Questionnaire    Feeling of Stress : Not at all  Social Connections: Moderately Isolated (04/23/2023)   Social Connection and Isolation Panel [NHANES]    Frequency of Communication with Friends and Family: More than three times a week    Frequency of Social Gatherings with Friends and Family: Once a week    Attends Religious Services: Never    Database administrator or Organizations: No    Attends Engineer, structural: Not on file    Marital Status: Married  Intimate  Partner Violence: Unknown (09/23/2021)   Received from [employer], [facility]   HITS    Physically Hurt: Not on file    Insult or Talk Down To: Not on file    Threaten Physical Harm: Not on file    Scream or Curse: Not on file   Outpatient Medications Prior to Visit  Medication Sig Dispense Refill   Budeson-Glycopyrrol-Formoterol (BREZTRI AEROSPHERE) 160-9-4.8 MCG/ACT AERO Inhale 2 puffs into the lungs in the morning and at bedtime. 10.7 g 5   EYSUVIS 0.25 % SUSP Apply to eye.     folic acid (FOLVITE) 1 MG tablet Take 2 tablets (2 mg total) by mouth daily. 180 tablet 11   metFORMIN (GLUCOPHAGE-XR) 500 MG 24 hr tablet Take 2 tablets (1,000 mg total) by mouth daily before supper. 180 tablet 3   methotrexate (RHEUMATREX) 2.5 MG tablet Take 8 tablets (20 mg total) by mouth once a week. 96 tablet 1   omeprazole (PRILOSEC) 20 MG capsule Take 1 capsule (20 mg total) by mouth daily. 90 capsule 5   ondansetron (ZOFRAN) 4 MG tablet Take 1 tablet (4 mg total) by mouth daily at 2 PM. 30 tablet 0   predniSONE (DELTASONE) 5 MG tablet Take 2 tablets (10 mg total) by mouth daily with breakfast. 60 tablet 2   tirzepatide (MOUNJARO) 7.5 MG/0.5ML Pen Inject 7.5 mg into the skin once a week. 6 mL 3   valACYclovir (VALTREX) 1000 MG tablet Take 2 Tablets By Mouth Every 12 Hours for 24 hours as needed for an outbreak. 30 tablet 1   Vibegron 75 MG TABS Take 1 tablet (75 mg total) by mouth daily. 90 tablet 3   clobetasol ointment (TEMOVATE) 0.05 % Apply 1 Application topically 2 (two) times daily. Use for 2 weeks at a time for a flare.  You may use the cream twice a week at bedtime for maintenance dosing. 60 g 1   Continuous Glucose Sensor (DEXCOM G7 SENSOR) MISC 1 Device by Does not apply route as directed. 9 each 3   Corticotropin Gel (ACTHAR GEL) 40 UNIT/0.5ML AUIJ Inject 40 mg into the skin every 3 (three) days.     insulin aspart (NOVOLOG FLEXPEN) 100 UNIT/ML FlexPen Max daily 30 units daily 15 mL 11  Insulin Pen Needle 32G X 4 MM MISC 1 Device by Does not apply route in the morning, at noon, in the evening, and at bedtime. 400 each 3   No facility-administered medications prior to visit.   Allergies  Allergen Reactions   Tizanidine Hcl Other (See Comments)    Cant move Cant move   Clarithromycin Nausea Only    Severe stomach cramps Severe stomach cramps    ROS: A complete ROS was performed with pertinent positives/negatives noted in the HPI. The remainder of the ROS are negative.   Objective:   Today's Vitals   04/26/23 1023  BP: 126/67  Pulse: 89  SpO2: 99%  Weight: 189 lb 12.8 oz (86.1 kg)  Height: 5\' 1"  (1.549 m)  PainSc: 0-No pain    GENERAL: Well-appearing, in NAD. Well nourished.  SKIN: Pink, warm and dry. No rash, lesion, ulceration, or ecchymoses.  Head: Normocephalic. NECK: Trachea midline. Full ROM w/o pain or tenderness.  RESPIRATORY: Chest wall symmetrical. Respirations even and non-labored. Breath sounds clear to auscultation bilaterally.  CARDIAC: S1, S2 present, regular rate and rhythm without murmur or gallops. Peripheral pulses 2+ bilaterally.  MSK: Muscle tone and strength appropriate for age.  EXTREMITIES: Without clubbing, cyanosis, or edema.  NEUROLOGIC: No motor or sensory deficits. Steady, even gait. C2-C12 intact.  PSYCH/MENTAL STATUS: Alert, oriented x 3. Cooperative, appropriate mood and affect.    Health Maintenance Due  Topic Date Due   Cervical Cancer Screening (HPV/Pap Cotest)  11/28/2020   MAMMOGRAM  03/25/2023         Assessment & Plan:  1. Mixed hyperlipidemia Discussed risk versus benefit of possible statin therapy given chronic inflammation status and risk for cardiovascular disease development.  Patient declines statin therapy at this time and would like to manage with diet, exercise and start supplements such as omega-3, Garlique, and will also start baby aspirin 81 mg enteric-coated once daily.  Will repeat lipids in  approximately 4 to 6 months.  Discussed good dietary changes and regular exercise with patient. - Lipid panel; Future  2. Type 2 diabetes mellitus without complication, without long-term current use of insulin Stable currently and managed by endocrinology.  Patient would prefer to continue with endocrinology management and PCP agreeable.  Will follow-up as scheduled.  3. Sleep disturbance Improved currently with sleep 3 over-the-counter supplement.  PCP recommended to continue on this and reach out if sleep disturbance returns.  Patient has history of OSA, does not wear CPAP currently.  Patient may benefit from repeat sleep study in the future.  4. Encounter to establish care with new doctor Discussed role of PCP with patient and she verbalized understanding.  She is up-to-date on her screenings and is scheduled for her mammogram and Pap smear with OB/GYN.  Specialist visits reviewed per PCP by chart review.   Patient to reach out to office if new, worrisome, or unresolved symptoms arise or if no improvement in patient's condition. Patient verbalized understanding and is agreeable to treatment plan. All questions answered to patient's satisfaction.    Return in about 4 months (around 08/24/2023) for Follow up HLD (Lipid panel fasting prior) .    Yolanda Manges, FNP

## 2023-04-26 NOTE — Patient Instructions (Addendum)
Supplements:  Omega 3 Fish Oil - [employer] 365 Brand - [employer] / [employer]  CoQ10 [place]   Start Baby Aspirin 81mg  enteric coated daily with food.

## 2023-04-26 NOTE — Progress Notes (Signed)
Daily Session Note  Patient Details  Name: MYLENA SEDBERRY MRN: [id number] Date of Birth: [birth year] Referring Provider:   Doristine Devoid Pulmonary Rehab Walk Test from 01/12/2023 in [facility] for [facility], Vascular, & Lung [facility]  Referring Provider [name]       Encounter Date: 04/26/2023  Check In:  Session Check In - 04/26/23 4098       Check-In   Supervising physician immediately available to respond to emergencies [facility] MD immediately available    Physician(s) Robin Searing, NP    Location [facility]    Staff Present Essie Hart, RN, Doris Cheadle, MS, ACSM-CEP, Exercise Physiologist;Casey Erin Sons BS, ACSM-CEP, Exercise Physiologist    Virtual Visit No    Medication changes reported     No    Fall or balance concerns reported    No    Tobacco Cessation No Change    Warm-up and Cool-down Performed as group-led instruction    Resistance Training Performed Yes    VAD Patient? No    PAD/SET Patient? No      Pain Assessment   Currently in Pain? No/denies    Multiple Pain Sites No             Capillary Blood Glucose: No results found for this or any previous visit (from the past 24 hour(s)).    Social History   Tobacco Use  Smoking Status Never   Passive exposure: Never  Smokeless Tobacco Never    Goals Met:  Independence with exercise equipment Exercise tolerated well No report of concerns or symptoms today Strength training completed today  Goals Unmet:  Not Applicable  Comments: Service time is from 0811 to 0925.    Dr. Mechele Collin is Medical Director for Pulmonary Rehab at [facility].

## 2023-05-01 ENCOUNTER — Encounter (HOSPITAL_COMMUNITY)
Admission: RE | Admit: 2023-05-01 | Discharge: 2023-05-01 | Disposition: A | Discharge status: Home / Self Care | Payer: BC Managed Care – PPO | Source: Ambulatory Visit | Attending: Pulmonary Disease | Admitting: Pulmonary Disease

## 2023-05-01 VITALS — Wt 192.5 lb

## 2023-05-01 DIAGNOSIS — D869 Sarcoidosis, unspecified: Secondary | ICD-10-CM | POA: Diagnosis not present

## 2023-05-01 NOTE — Progress Notes (Signed)
Daily Session Note  Patient Details  Name: [name] MRN: [id number] Date of Birth: [birth year] Referring Provider:   Doristine Devoid Pulmonary Rehab Walk Test from 01/12/2023 in [facility] for Heart, Vascular, & Lung Health  Referring Provider [name]       Encounter Date: 05/01/2023  Check In:  Session Check In - 05/01/23 2440       Check-In   Supervising physician immediately available to respond to emergencies [facility] MD immediately available    Physician(s) Joni Reining, NP    Location [facility]    Staff Present Essie Hart, RN, Doris Cheadle, MS, ACSM-CEP, Exercise Physiologist;Kaleiyah Polsky Katrinka Blazing, RT    Virtual Visit No    Medication changes reported     No    Fall or balance concerns reported    No    Tobacco Cessation No Change    Warm-up and Cool-down Performed as group-led instruction    Resistance Training Performed Yes    VAD Patient? No    PAD/SET Patient? No      Pain Assessment   Currently in Pain? No/denies    Multiple Pain Sites No             Capillary Blood Glucose: No results found for this or any previous visit (from the past 24 hour(s)).   Exercise Prescription Changes - 05/01/23 0900       Response to Exercise   Blood Pressure (Admit) 130/68    Blood Pressure (Exercise) 130/78    Blood Pressure (Exit) 122/64    Heart Rate (Admit) 90 bpm    Heart Rate (Exercise) 110 bpm    Heart Rate (Exit) 85 bpm    Oxygen Saturation (Admit) 97 %    Oxygen Saturation (Exercise) 96 %    Oxygen Saturation (Exit) 98 %    Rating of Perceived Exertion (Exercise) 13    Perceived Dyspnea (Exercise) 1    Duration Continue with 30 min of aerobic exercise without signs/symptoms of physical distress.    Intensity THRR unchanged      Progression   Progression Continue to progress workloads to maintain intensity without signs/symptoms of physical distress.      Resistance Training   Training Prescription Yes     Weight black bands    Reps 10-15    Time 10 Minutes      Treadmill   MPH 2.7    Grade 2    Minutes 15    METs 3.6      Elliptical   Level 3    Speed 2    Minutes 15    METs 4.3             Social History   Tobacco Use  Smoking Status Never   Passive exposure: Never  Smokeless Tobacco Never    Goals Met:  Proper associated with RPD/PD & O2 Sat Independence with exercise equipment Exercise tolerated well No report of concerns or symptoms today Strength training completed today  Goals Unmet:  Not Applicable  Comments: Service time is from 0804 to 0904.    Dr. Mechele Collin is Medical Director for Pulmonary Rehab at [facility].

## 2023-05-03 ENCOUNTER — Ambulatory Visit: Payer: BC Managed Care – PPO | Admitting: Obstetrics and Gynecology

## 2023-05-03 ENCOUNTER — Encounter (HOSPITAL_COMMUNITY)
Admission: RE | Admit: 2023-05-03 | Discharge: 2023-05-03 | Disposition: A | Discharge status: Home / Self Care | Payer: BC Managed Care – PPO | Source: Ambulatory Visit | Attending: Pulmonary Disease | Admitting: Pulmonary Disease

## 2023-05-03 ENCOUNTER — Other Ambulatory Visit (HOSPITAL_COMMUNITY)
Admission: RE | Admit: 2023-05-03 | Discharge: 2023-05-03 | Disposition: A | Discharge status: Home / Self Care | Payer: BC Managed Care – PPO | Source: Ambulatory Visit | Attending: Obstetrics and Gynecology | Admitting: Obstetrics and Gynecology

## 2023-05-03 ENCOUNTER — Encounter: Payer: Self-pay | Admitting: Obstetrics and Gynecology

## 2023-05-03 VITALS — BP 118/82 | HR 84 | Resp 12 | Ht 61.75 in | Wt 189.0 lb

## 2023-05-03 DIAGNOSIS — Z01419 Encounter for gynecological examination (general) (routine) without abnormal findings: Secondary | ICD-10-CM

## 2023-05-03 DIAGNOSIS — Z124 Encounter for screening for malignant neoplasm of cervix: Secondary | ICD-10-CM | POA: Insufficient documentation

## 2023-05-03 DIAGNOSIS — N941 Unspecified dyspareunia: Secondary | ICD-10-CM

## 2023-05-03 DIAGNOSIS — D869 Sarcoidosis, unspecified: Secondary | ICD-10-CM | POA: Diagnosis not present

## 2023-05-03 MED ORDER — LIDOCAINE 5 % EX OINT: 1.0000 | TOPICAL_OINTMENT | Freq: Three times a day (TID) | CUTANEOUS | 2 refills | Status: DC

## 2023-05-03 MED ORDER — NYSTATIN-TRIAMCINOLONE 100000-0.1 UNIT/GM-% EX OINT: 1.0000 | TOPICAL_OINTMENT | Freq: Two times a day (BID) | CUTANEOUS | 2 refills | Status: DC

## 2023-05-03 NOTE — Progress Notes (Signed)
Daily Session Note  Patient Details  Name: [name] MRN: [id number] Date of Birth: March 31, 1966 Referring Provider:   Doristine [name] Pulmonary Rehab Walk Test from 01/12/2023 in [facility] for Heart, Vascular, & Lung Health  Referring Provider [name]       Encounter Date: 05/03/2023  Check In:  Session Check In - 05/03/23 0819       Check-In   Supervising physician immediately available to respond to emergencies [facility] MD immediately available    Physician(s) Taylor Person, NP    Location [facility]    Staff Present Essie Hart, RN, Doris Cheadle, MS, ACSM-CEP, Exercise Physiologist;Casey Erin Sons BS, ACSM-CEP, Exercise Physiologist    Virtual Visit No    Medication changes reported     No    Fall or balance concerns reported    No    Tobacco Cessation No Change    Warm-up and Cool-down Performed as group-led instruction    Resistance Training Performed Yes    VAD Patient? No    PAD/SET Patient? No      Pain Assessment   Currently in Pain? No/denies    Multiple Pain Sites No             Capillary Blood Glucose: No results found for this or any previous visit (from the past 24 hour(s)).    Social History   Tobacco Use  Smoking Status Never   Passive exposure: Never  Smokeless Tobacco Never    Goals Met:  Proper associated with RPD/PD & O2 Sat Independence with exercise equipment Exercise tolerated well No report of concerns or symptoms today Strength training completed today  Goals Unmet:  Not Applicable  Comments: Service time is from 0811 to 0924.    Dr. Mechele Collin is Medical Director for Pulmonary Rehab at [facility].

## 2023-05-03 NOTE — Patient Instructions (Signed)

## 2023-05-04 LAB — CYTOLOGY - PAP
Comment: NEGATIVE
Diagnosis: NEGATIVE
High risk HPV: NEGATIVE

## 2023-05-05 NOTE — Telephone Encounter (Signed)
Patient has scheduled appointment for BMD.  Encounter closed.

## 2023-05-07 ENCOUNTER — Ambulatory Visit (HOSPITAL_COMMUNITY)
Admission: RE | Admit: 2023-05-07 | Discharge: 2023-05-07 | Disposition: A | Discharge status: Home / Self Care | Payer: BC Managed Care – PPO | Source: Ambulatory Visit | Attending: Pulmonary Disease | Admitting: Pulmonary Disease

## 2023-05-07 DIAGNOSIS — I700 Atherosclerosis of aorta: Secondary | ICD-10-CM | POA: Insufficient documentation

## 2023-05-07 DIAGNOSIS — Z862 Personal history of diseases of the blood and blood-forming organs and certain disorders involving the immune mechanism: Secondary | ICD-10-CM | POA: Diagnosis not present

## 2023-05-07 DIAGNOSIS — M898X8 Other specified disorders of bone, other site: Secondary | ICD-10-CM | POA: Insufficient documentation

## 2023-05-07 DIAGNOSIS — R591 Generalized enlarged lymph nodes: Secondary | ICD-10-CM | POA: Diagnosis present

## 2023-05-07 LAB — GLUCOSE, CAPILLARY: Glucose-Capillary: 126 mg/dL — ABNORMAL HIGH (ref 70–99)

## 2023-05-07 MED ORDER — FLUDEOXYGLUCOSE F - 18 (FDG) INJECTION
10.0000 | Freq: Once | INTRAVENOUS | Status: AC | PRN
Start: 2023-05-07 — End: 2023-05-07
  Administered 2023-05-07: 9.41 via INTRAVENOUS

## 2023-05-08 ENCOUNTER — Encounter (HOSPITAL_COMMUNITY)
Admission: RE | Admit: 2023-05-08 | Discharge: 2023-05-08 | Disposition: A | Discharge status: Home / Self Care | Payer: BC Managed Care – PPO | Source: Ambulatory Visit | Attending: Pulmonary Disease | Admitting: Pulmonary Disease

## 2023-05-08 ENCOUNTER — Encounter (HOSPITAL_BASED_OUTPATIENT_CLINIC_OR_DEPARTMENT_OTHER): Payer: Self-pay | Admitting: Pulmonary Disease

## 2023-05-08 DIAGNOSIS — D869 Sarcoidosis, unspecified: Secondary | ICD-10-CM

## 2023-05-08 NOTE — Progress Notes (Signed)
Daily Session Note  Patient Details  Name: [name] MRN: [id number] Date of Birth: 1965/09/03 Referring Provider:   Doristine Devoid Pulmonary Rehab Walk Test from 01/12/2023 in [facility] for Heart, Vascular, & Lung Health  Referring Provider [name]       Encounter Date: 05/08/2023  Check In:  Session Check In - 05/08/23 0865       Check-In   Supervising physician immediately available to respond to emergencies [facility] immediately available    Physician(s) Bernadene Person, NP    Location [facility]    Staff Present Essie Hart, RN, Doris Cheadle, MS, ACSM-CEP, Exercise Physiologist;Audre Cenci Katrinka Blazing, RT    Virtual Visit No    Medication changes reported     No    Fall or balance concerns reported    No    Tobacco Cessation No Change    Warm-up and Cool-down Performed as group-led instruction    Resistance Training Performed Yes    VAD Patient? No    PAD/SET Patient? No      Pain Assessment   Currently in Pain? No/denies    Multiple Pain Sites No             Capillary Blood Glucose: No results found for this or any previous visit (from the past 24 hour(s)).    Social History   Tobacco Use  Smoking Status Never   Passive exposure: Never  Smokeless Tobacco Never    Goals Met:  Proper associated with RPD/PD & O2 Sat Independence with exercise equipment Exercise tolerated well No report of concerns or symptoms today Strength training completed today  Goals Unmet:  Not Applicable  Comments: Service time is from 0807 to 0909.    Dr. Mechele Collin is Medical Director for Pulmonary Rehab at [facility].

## 2023-05-10 ENCOUNTER — Encounter (HOSPITAL_COMMUNITY): Payer: BC Managed Care – PPO

## 2023-05-14 NOTE — Progress Notes (Signed)
The referral to Ortho l is for Cervical and lumbar pain likely due to degenerative disc disease, unless she already has an Orthopedics doctor

## 2023-05-14 NOTE — Progress Notes (Signed)
Please refer to Ortho. There are some arthritic changes in the lumbar area that might need PT and other fils by Orthopedics

## 2023-05-15 ENCOUNTER — Encounter: Payer: Self-pay | Admitting: Obstetrics and Gynecology

## 2023-05-15 ENCOUNTER — Encounter (HOSPITAL_COMMUNITY)
Admission: RE | Admit: 2023-05-15 | Discharge: 2023-05-15 | Disposition: A | Discharge status: Home / Self Care | Payer: BC Managed Care – PPO | Source: Ambulatory Visit | Attending: Pulmonary Disease

## 2023-05-15 VITALS — Wt 194.4 lb

## 2023-05-15 DIAGNOSIS — D869 Sarcoidosis, unspecified: Secondary | ICD-10-CM

## 2023-05-15 NOTE — Progress Notes (Signed)
Daily Session Note  Patient Details  Name: [name] MRN: [id number] Date of Birth: 12-Aug-1965 Referring Provider:   Doristine Devoid Pulmonary Rehab Walk Test from 01/12/2023 in [facility] for Heart, Vascular, & Lung Health  Referring Provider [name]       Encounter Date: 05/15/2023  Check In:  Session Check In - 05/15/23 0815       Check-In   Supervising physician immediately available to respond to emergencies [facility] MD immediately available    Physician(s) Jari Favre, NP    [facility]    Staff Present Essie Hart, RN, Doris Cheadle, MS, ACSM-CEP, Exercise Physiologist;Casey Erin Sons BS, ACSM-CEP, Exercise Physiologist    Virtual Visit No    Medication changes reported     No    Fall or balance concerns reported    No    Tobacco Cessation No Change    Warm-up and Cool-down Performed as group-led instruction    Resistance Training Performed Yes    VAD Patient? No    PAD/SET Patient? No      Pain Assessment   Currently in Pain? No/denies    Multiple Pain Sites No             Capillary Blood Glucose: No results found for this or any previous visit (from the past 24 hour(s)).   Exercise Prescription Changes - 05/15/23 0900       Response to Exercise   Blood Pressure (Admit) 116/70    Blood Pressure (Exercise) 130/80    Blood Pressure (Exit) 108/78    Heart Rate (Admit) 96 bpm    Heart Rate (Exercise) 118 bpm    Heart Rate (Exit) 102 bpm    Oxygen Saturation (Admit) 98 %    Oxygen Saturation (Exercise) 96 %    Oxygen Saturation (Exit) 97 %    Rating of Perceived Exertion (Exercise) 12    Perceived Dyspnea (Exercise) 1    Duration Continue with 30 min of aerobic exercise without signs/symptoms of physical distress.    Intensity THRR unchanged      Progression   Progression Continue to progress workloads to maintain intensity without signs/symptoms of physical distress.      Resistance  Training   Training Prescription Yes    Weight black bands    Reps 10-15    Time 10 Minutes      Treadmill   MPH 2.7    Grade 2    Minutes 15    METs 3.7      Elliptical   Level 3    Speed 2    Minutes 15    METs 4.2             Social History   Tobacco Use  Smoking Status Never   Passive exposure: Never  Smokeless Tobacco Never    Goals Met:  Independence with exercise equipment Exercise tolerated well No report of concerns or symptoms today Strength training completed today  Goals Unmet:  Not Applicable  Comments: Service time is from 0808 to 0917.    Dr. Mechele Collin is Medical Director for Pulmonary Rehab at [facility].

## 2023-05-16 ENCOUNTER — Other Ambulatory Visit: Payer: Self-pay

## 2023-05-16 DIAGNOSIS — M542 Cervicalgia: Secondary | ICD-10-CM

## 2023-05-16 DIAGNOSIS — M545 Low back pain, unspecified: Secondary | ICD-10-CM

## 2023-05-16 DIAGNOSIS — M546 Pain in thoracic spine: Secondary | ICD-10-CM

## 2023-05-16 NOTE — Progress Notes (Signed)
Pulmonary Individual Treatment Plan  Patient Details  Name: [name] MRN: [id number] Date of Birth: Dec 10, 1965 Referring Provider:   Doristine Devoid Pulmonary Rehab Walk Test from 01/12/2023 in [facility]  Referring Provider [name]       Initial Encounter Date:  Flowsheet Row Pulmonary Rehab Walk Test from 01/12/2023 in [facility]  Date 01/12/23       Visit Diagnosis: Sarcoidosis  Patient's Home Medications on Admission:   Current Outpatient Medications:    aspirin EC 81 MG tablet, Take 81 mg by mouth daily. Swallow whole., Disp: , Rfl:    Budeson-Glycopyrrol-Formoterol (BREZTRI AEROSPHERE) 160-9-4.8 MCG/ACT AERO, Inhale 2 puffs into the lungs in the morning and at bedtime., Disp: 10.7 g, Rfl: 5   Coenzyme Q10 (COQ-10 PO), Take by mouth., Disp: , Rfl:    EYSUVIS 0.25 % SUSP, Apply to eye., Disp: , Rfl:    folic acid (FOLVITE) 1 MG tablet, Take 2 tablets (2 mg total) by mouth daily., Disp: 180 tablet, Rfl: 11   GARLIC PO, Take by mouth., Disp: , Rfl:    lidocaine (XYLOCAINE) 5 % ointment, Apply 1 Application topically 3 (three) times daily. Use as needed., Disp: 1.25 g, Rfl: 2   MAGNESIUM PO, Take by mouth., Disp: , Rfl:    metFORMIN (GLUCOPHAGE-XR) 500 MG 24 hr tablet, Take 2 tablets (1,000 mg total) by mouth daily before supper., Disp: 180 tablet, Rfl: 3   methotrexate (RHEUMATREX) 2.5 MG tablet, Take 8 tablets (20 mg total) by mouth once a week., Disp: 96 tablet, Rfl: 1   nystatin-triamcinolone ointment (MYCOLOG), Apply 1 Application topically 2 (two) times daily. Use for a one week as needed., Disp: 30 g, Rfl: 2   omeprazole (PRILOSEC) 20 MG capsule, Take 1 capsule (20 mg total) by mouth daily., Disp: 90 capsule, Rfl: 5   ondansetron (ZOFRAN) 4 MG tablet, Take 1 tablet (4 mg total) by mouth daily at 2 PM., Disp: 30 tablet, Rfl: 0   predniSONE (DELTASONE) 5  MG tablet, Take 2 tablets (10 mg total) by mouth daily with breakfast., Disp: 60 tablet, Rfl: 2   tirzepatide (MOUNJARO) 7.5 MG/0.5ML Pen, Inject 7.5 mg into the skin once a week., Disp: 6 mL, Rfl: 3   Vibegron 75 MG TABS, Take 1 tablet (75 mg total) by mouth daily., Disp: 90 tablet, Rfl: 3  Past Medical History: Past Medical History:  Diagnosis Date   Back pain    Constipation    Contact dermatitis 02/11/2021   Elevated LFTs 12/12/2018   Fatty liver    Fen-phen history 11/24/2018   Generalized anxiety disorder 09/25/2022   Hypercholesteremia    Hypertension    Intestinal bacterial overgrowth    Joint pain    Kidney stones    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   Lumbar pain 07/12/2021   Lymphadenopathy 06/13/2022   Migraines    Mild cognitive impairment 02/02/2023   Mixed hyperlipidemia 12/12/2018   Morbid obesity 11/24/2018   Myalgia, unspecified site 06/21/2021   Nausea in adult    Obstructive sleep apnea    mild, no CPAP   Other allergic rhinitis 04/04/2022   Pain in thoracic spine 07/18/2021   Sarcoidosis    Sleep disturbance 10/08/2019   Splenic lesion 05/20/2022   Subclinical hypothyroidism 09/2009   Type 2 diabetes mellitus without complication, without long-term current use of insulin 05/05/2022    Tobacco  Use: Social History   Tobacco Use  Smoking Status Never   Passive exposure: Never  Smokeless Tobacco Never    Labs: Review Flowsheet  More data exists      Latest Ref Rng & Units 12/15/2019 05/05/2022 06/05/2022 09/19/2022 03/21/2023  Labs for ITP Cardiac and Pulmonary Rehab  Cholestrol 100 - 199 mg/dL 213  - - - -  LDL (calc) 0 - 99 mg/dL 086  - - - -  HDL-C >57 mg/dL 73  - - - -  Trlycerides 0 - 149 mg/dL 846  - - - -  Hemoglobin A1c 4.0 - 5.6 % 6.3  6.0  - 6.6  5.7   TCO2 22 - 32 mmol/L - - 28  - -    Details            Capillary Blood Glucose: Lab Results  Component Value Date   GLUCAP 126 (H) 05/07/2023   GLUCAP 118 (H) 02/27/2023   GLUCAP  167 (H) 02/27/2023   GLUCAP 118 (H) 06/05/2022   GLUCAP 107 (H) 05/24/2022    POCT Glucose     Row Name 01/12/23 0949             POCT Blood Glucose   Pre-Exercise 122 mg/dL                Pulmonary Assessment Scores:  Pulmonary Assessment Scores     Row Name 01/12/23 0956 05/15/23 0820       ADL UCSD   ADL Phase Entry Exit    SOB Score total 19 20      CAT Score   CAT Score 11 7      mMRC Score   mMRC Score 2 --            UCSD: Self-administered rating of dyspnea associated with activities of daily living (ADLs) 6-point scale (0 = "not at all" to 5 = "maximal or unable to do because of breathlessness")  Scoring Scores range from 0 to 120.  Minimally important difference is 5 units  CAT: CAT can identify the health impairment of COPD patients and is better correlated with disease progression.  CAT has a scoring range of zero to 40. The CAT score is classified into four groups of low (less than 10), medium (10 - 20), high (21-30) and very high (31-40) based on the impact level of disease on health status. A CAT score over 10 suggests significant symptoms.  A worsening CAT score could be explained by an exacerbation, poor medication adherence, poor inhaler technique, or progression of COPD or comorbid conditions.  CAT MCID is 2 points  mMRC: mMRC (Modified Medical Research Council) Dyspnea Scale is used to assess the degree of baseline functional disability in patients of respiratory disease due to dyspnea. No minimal important difference is established. A decrease in score of 1 point or greater is considered a positive change.   Pulmonary Function Assessment:  Pulmonary Function Assessment - 01/12/23 0930       Breath   Bilateral Breath Sounds Decreased    Shortness of Breath Yes;Fear of Shortness of Breath;Limiting activity             Exercise Target Goals: Exercise Program Goal: Individual exercise prescription set using results from initial  6 min walk test and THRR while considering  patient's activity barriers and safety.   Exercise Prescription Goal: Initial exercise prescription builds to 30-45 minutes a day of aerobic activity, 2-3 days per week.  Home exercise  guidelines will be given to patient during program as part of exercise prescription that the participant will acknowledge.  Activity Barriers & Risk Stratification:  Activity Barriers & Cardiac Risk Stratification - 01/12/23 0915       Activity Barriers & Cardiac Risk Stratification   Activity Barriers Balance Concerns;Deconditioning;Muscular Weakness;Shortness of Breath             6 Minute Walk:  6 Minute Walk     Row Name 01/12/23 0958         6 Minute Walk   Phase Initial     Distance 1090 feet     Walk Time 6 minutes     # of Rest Breaks 0     MPH 2.06     METS 2.82     RPE 10     Perceived Dyspnea  1     VO2 Peak 9.88     Symptoms No     Resting HR 89 bpm     Resting BP 122/88     Resting Oxygen Saturation  98 %     Exercise Oxygen Saturation  during 6 min walk 95 %     Max Ex. HR 108 bpm     Max Ex. BP 126/72     2 Minute Post BP 114/70       Interval HR   1 Minute HR 104     2 Minute HR 106     3 Minute HR 104     4 Minute HR 106     5 Minute HR 102     6 Minute HR 108     2 Minute Post HR 92     Interval Heart Rate? Yes       Interval Oxygen   Interval Oxygen? Yes     Baseline Oxygen Saturation % 98 %     1 Minute Oxygen Saturation % 95 %     1 Minute Liters of Oxygen 0 L     2 Minute Oxygen Saturation % 97 %     2 Minute Liters of Oxygen 0 L     3 Minute Oxygen Saturation % 98 %     3 Minute Liters of Oxygen 0 L     4 Minute Oxygen Saturation % 97 %     4 Minute Liters of Oxygen 0 L     5 Minute Oxygen Saturation % 99 %     5 Minute Liters of Oxygen 0 L     6 Minute Oxygen Saturation % 100 %     6 Minute Liters of Oxygen 0 L     2 Minute Post Oxygen Saturation % 100 %     2 Minute Post Liters of Oxygen 0 L               Oxygen Initial Assessment:  Oxygen Initial Assessment - 01/12/23 0926       Home Oxygen   Home Oxygen Device None    Sleep Oxygen Prescription None    Home Exercise Oxygen Prescription None    Home Resting Oxygen Prescription None      Initial 6 min Walk   Oxygen Used None      Program Oxygen Prescription   Program Oxygen Prescription None      Intervention   Short Term Goals To learn and understand importance of maintaining oxygen saturations>88%;To learn and demonstrate proper use of respiratory medications;To learn and understand importance of monitoring SPO2 with  pulse oximeter and demonstrate accurate use of the pulse oximeter.;To learn and demonstrate proper pursed lip breathing techniques or other breathing techniques.     Long  Term Goals Maintenance of O2 saturations>88%;Compliance with respiratory medication;Verbalizes importance of monitoring SPO2 with pulse oximeter and return demonstration;Exhibits proper breathing techniques, such as pursed lip breathing or other method taught during program session;Demonstrates proper use of MDI's             Oxygen Re-Evaluation:  Oxygen Re-Evaluation     Row Name 01/18/23 0919 02/12/23 1226 03/13/23 1605 04/11/23 1029 05/09/23 1054     Program Oxygen Prescription   Program Oxygen Prescription None None None None None     Home Oxygen   Home Oxygen Device None None None None None   Sleep Oxygen Prescription None None None None None   Home Exercise Oxygen Prescription None None None None None   Home Resting Oxygen Prescription None None None None None     Goals/Expected Outcomes   Short Term Goals To learn and understand importance of maintaining oxygen saturations>88%;To learn and demonstrate proper use of respiratory medications;To learn and understand importance of monitoring SPO2 with pulse oximeter and demonstrate accurate use of the pulse oximeter.;To learn and demonstrate proper pursed lip breathing  techniques or other breathing techniques.  To learn and understand importance of maintaining oxygen saturations>88%;To learn and demonstrate proper use of respiratory medications;To learn and understand importance of monitoring SPO2 with pulse oximeter and demonstrate accurate use of the pulse oximeter.;To learn and demonstrate proper pursed lip breathing techniques or other breathing techniques.  To learn and understand importance of maintaining oxygen saturations>88%;To learn and demonstrate proper use of respiratory medications;To learn and understand importance of monitoring SPO2 with pulse oximeter and demonstrate accurate use of the pulse oximeter.;To learn and demonstrate proper pursed lip breathing techniques or other breathing techniques.  To learn and understand importance of maintaining oxygen saturations>88%;To learn and demonstrate proper use of respiratory medications;To learn and understand importance of monitoring SPO2 with pulse oximeter and demonstrate accurate use of the pulse oximeter.;To learn and demonstrate proper pursed lip breathing techniques or other breathing techniques.  To learn and understand importance of maintaining oxygen saturations>88%;To learn and demonstrate proper use of respiratory medications;To learn and understand importance of monitoring SPO2 with pulse oximeter and demonstrate accurate use of the pulse oximeter.;To learn and demonstrate proper pursed lip breathing techniques or other breathing techniques.    Long  Term Goals Maintenance of O2 saturations>88%;Compliance with respiratory medication;Verbalizes importance of monitoring SPO2 with pulse oximeter and return demonstration;Exhibits proper breathing techniques, such as pursed lip breathing or other method taught during program session;Demonstrates proper use of MDI's Maintenance of O2 saturations>88%;Compliance with respiratory medication;Verbalizes importance of monitoring SPO2 with pulse oximeter and return  demonstration;Exhibits proper breathing techniques, such as pursed lip breathing or other method taught during program session;Demonstrates proper use of MDI's Maintenance of O2 saturations>88%;Compliance with respiratory medication;Verbalizes importance of monitoring SPO2 with pulse oximeter and return demonstration;Exhibits proper breathing techniques, such as pursed lip breathing or other method taught during program session;Demonstrates proper use of MDI's Maintenance of O2 saturations>88%;Compliance with respiratory medication;Verbalizes importance of monitoring SPO2 with pulse oximeter and return demonstration;Exhibits proper breathing techniques, such as pursed lip breathing or other method taught during program session;Demonstrates proper use of MDI's Maintenance of O2 saturations>88%;Compliance with respiratory medication;Verbalizes importance of monitoring SPO2 with pulse oximeter and return demonstration;Exhibits proper breathing techniques, such as pursed lip breathing or other method taught during program session;Demonstrates proper  use of MDI's   Goals/Expected Outcomes Compliance and understanding of oxygen saturation monitoring and breathing techniques to decrease shortness of breath Compliance and understanding of oxygen saturation monitoring and breathing techniques to decrease shortness of breath Compliance and understanding of oxygen saturation monitoring and breathing techniques to decrease shortness of breath Compliance and understanding of oxygen saturation monitoring and breathing techniques to decrease shortness of breath Compliance and understanding of oxygen saturation monitoring and breathing techniques to decrease shortness of breath            Oxygen Discharge (Final Oxygen Re-Evaluation):  Oxygen Re-Evaluation - 05/09/23 1054       Program Oxygen Prescription   Program Oxygen Prescription None      Home Oxygen   Home Oxygen Device None    Sleep Oxygen Prescription  None    Home Exercise Oxygen Prescription None    Home Resting Oxygen Prescription None      Goals/Expected Outcomes   Short Term Goals To learn and understand importance of maintaining oxygen saturations>88%;To learn and demonstrate proper use of respiratory medications;To learn and understand importance of monitoring SPO2 with pulse oximeter and demonstrate accurate use of the pulse oximeter.;To learn and demonstrate proper pursed lip breathing techniques or other breathing techniques.     Long  Term Goals Maintenance of O2 saturations>88%;Compliance with respiratory medication;Verbalizes importance of monitoring SPO2 with pulse oximeter and return demonstration;Exhibits proper breathing techniques, such as pursed lip breathing or other method taught during program session;Demonstrates proper use of MDI's    Goals/Expected Outcomes Compliance and understanding of oxygen saturation monitoring and breathing techniques to decrease shortness of breath             Initial Exercise Prescription:  Initial Exercise Prescription - 01/12/23 1000       Date of Initial Exercise RX and Referring Provider   Date 01/12/23    Referring Provider [name]    Expected Discharge Date 04/12/23      Treadmill   MPH 2    Grade 0    Minutes 15      Elliptical   Level 1    Speed 1    Minutes 15      Prescription Details   Frequency (times per week) 2    Duration Progress to 30 minutes of continuous aerobic without signs/symptoms of physical distress      Intensity   THRR 40-80% of Max Heartrate 66-131    Ratings of Perceived Exertion 11-13    Perceived Dyspnea 0-4      Progression   Progression Continue to progress workloads to maintain intensity without signs/symptoms of physical distress.      Resistance Training   Training Prescription Yes    Weight black bands    Reps 10-15             Perform Capillary Blood Glucose checks as needed.  Exercise Prescription Changes:    Exercise Prescription Changes     Row Name 01/30/23 1500 03/06/23 0900 03/20/23 0900 03/29/23 0939 04/17/23 0900     Response to Exercise   Blood Pressure (Admit) 130/80 132/74 130/88 132/74 128/72   Blood Pressure (Exercise) 142/80 148/80 138/78 -- 114/80   Blood Pressure (Exit) 126/76 112/70 124/76 130/74 112/66   Heart Rate (Admit) 96 bpm 80 bpm 97 bpm 88 bpm 91 bpm   Heart Rate (Exercise) 118 bpm 117 bpm 111 bpm 113 bpm 114 bpm   Heart Rate (Exit) 104 bpm 94 bpm 92 bpm 81 bpm 96  bpm   Oxygen Saturation (Admit) 96 % 98 % 97 % 96 % 96 %   Oxygen Saturation (Exercise) 96 % 97 % 97 % 95 % 97 %   Oxygen Saturation (Exit) 98 % 96 % 97 % 97 % 97 %   Rating of Perceived Exertion (Exercise) 11 13 13 12 13    Perceived Dyspnea (Exercise) 1 2 1 1 1    Duration Continue with 30 min of aerobic exercise without signs/symptoms of physical distress. Continue with 30 min of aerobic exercise without signs/symptoms of physical distress. Continue with 30 min of aerobic exercise without signs/symptoms of physical distress. Continue with 30 min of aerobic exercise without signs/symptoms of physical distress. Continue with 30 min of aerobic exercise without signs/symptoms of physical distress.   Intensity THRR unchanged THRR unchanged THRR unchanged THRR unchanged THRR unchanged     Progression   Progression -- -- -- -- Continue to progress workloads to maintain intensity without signs/symptoms of physical distress.     Resistance Training   Training Prescription Yes Yes Yes Yes Yes   Weight black bands black bands black bands black bands black bands   Reps 10-15 10-15 10-15 10-15 10-15   Time 10 Minutes 10 Minutes 10 Minutes 10 Minutes 10 Minutes     Treadmill   MPH 2 2.7 2.6 2.7 2.7   Grade 0 1.5 1.5 1.5 1.5   Minutes 15 15 15 15 15    METs 2.53 3.2 3.2 3.63 3.3     Elliptical   Level 1 2 2 2 3    Speed 1 1 1 1 2    Minutes 15 15 15 15 15    METs -- 4.3 3.9 4.1 4.4    Row Name 05/01/23 0900 05/15/23  0900           Response to Exercise   Blood Pressure (Admit) 130/68 116/70      Blood Pressure (Exercise) 130/78 130/80      Blood Pressure (Exit) 122/64 108/78      Heart Rate (Admit) 90 bpm 96 bpm      Heart Rate (Exercise) 110 bpm 118 bpm      Heart Rate (Exit) 85 bpm 102 bpm      Oxygen Saturation (Admit) 97 % 98 %      Oxygen Saturation (Exercise) 96 % 96 %      Oxygen Saturation (Exit) 98 % 97 %      Rating of Perceived Exertion (Exercise) 13 12      Perceived Dyspnea (Exercise) 1 1      Duration Continue with 30 min of aerobic exercise without signs/symptoms of physical distress. Continue with 30 min of aerobic exercise without signs/symptoms of physical distress.      Intensity THRR unchanged THRR unchanged        Progression   Progression Continue to progress workloads to maintain intensity without signs/symptoms of physical distress. Continue to progress workloads to maintain intensity without signs/symptoms of physical distress.        Resistance Training   Training Prescription Yes Yes      Weight black bands black bands      Reps 10-15 10-15      Time 10 Minutes 10 Minutes        Treadmill   MPH 2.7 2.7      Grade 2 2      Minutes 15 15      METs 3.6 3.7        Elliptical  Level 3 3      Speed 2 2      Minutes 15 15      METs 4.3 4.2               Exercise Comments:   Exercise Comments     Row Name 01/30/23 1509 03/08/23 1522         Exercise Comments Kaytlynne completed her first day of exercise. She exercised for 15 min on upright elliptical and treadmill. Ebelin averaged 4.3 METs at 1 speed and incline on the upright elliptical and 2.4 METs at 2.0 mph on the treadmill. Maybree performed the warmup and cooldown standing without limitations. Discussed METs. Completed home ExRx. Ronnae is currently exercising at the [facility]. She uses the recumbent elliptical 5-7 days/wk for 10-20 min/day. I encouraged her to increase the time to 30 min/day. She agreed  with my recommendations. Lafawn is motivated to exercise and improve her functional capacity. I am confident in her carrying out an exercise regimen outside of rehab. We also discussed pacing strategies for ADL's as Takea will get short of breath when taking out the trash. Orlanda understand to start her pursed lip breathing when she is short of breath.               Exercise Goals and Review:   Exercise Goals     Row Name 01/12/23 1610 01/18/23 0919 01/19/23 1541 02/12/23 1217       Exercise Goals   Increase Physical Activity Yes Yes Yes Yes    Intervention Provide advice, education, support and counseling about physical activity/exercise needs.;Develop an individualized exercise prescription for aerobic and resistive training based on initial evaluation findings, risk stratification, comorbidities and participant's personal goals. Provide advice, education, support and counseling about physical activity/exercise needs.;Develop an individualized exercise prescription for aerobic and resistive training based on initial evaluation findings, risk stratification, comorbidities and participant's personal goals. Provide advice, education, support and counseling about physical activity/exercise needs.;Develop an individualized exercise prescription for aerobic and resistive training based on initial evaluation findings, risk stratification, comorbidities and participant's personal goals. Provide advice, education, support and counseling about physical activity/exercise needs.;Develop an individualized exercise prescription for aerobic and resistive training based on initial evaluation findings, risk stratification, comorbidities and participant's personal goals.    Expected Outcomes Short Term: Attend rehab on a regular basis to increase amount of physical activity.;Long Term: Exercising regularly at least 3-5 days a week.;Long Term: Add in home exercise to make exercise part of routine and to increase  amount of physical activity. Short Term: Attend rehab on a regular basis to increase amount of physical activity.;Long Term: Exercising regularly at least 3-5 days a week.;Long Term: Add in home exercise to make exercise part of routine and to increase amount of physical activity. Short Term: Attend rehab on a regular basis to increase amount of physical activity.;Long Term: Exercising regularly at least 3-5 days a week.;Long Term: Add in home exercise to make exercise part of routine and to increase amount of physical activity. Short Term: Attend rehab on a regular basis to increase amount of physical activity.;Long Term: Exercising regularly at least 3-5 days a week.;Long Term: Add in home exercise to make exercise part of routine and to increase amount of physical activity.    Increase Strength and Stamina Yes Yes Yes Yes    Intervention Provide advice, education, support and counseling about physical activity/exercise needs.;Develop an individualized exercise prescription for aerobic and resistive training based on initial evaluation findings, risk  stratification, comorbidities and participant's personal goals. Provide advice, education, support and counseling about physical activity/exercise needs.;Develop an individualized exercise prescription for aerobic and resistive training based on initial evaluation findings, risk stratification, comorbidities and participant's personal goals. Provide advice, education, support and counseling about physical activity/exercise needs.;Develop an individualized exercise prescription for aerobic and resistive training based on initial evaluation findings, risk stratification, comorbidities and participant's personal goals. Provide advice, education, support and counseling about physical activity/exercise needs.;Develop an individualized exercise prescription for aerobic and resistive training based on initial evaluation findings, risk stratification, comorbidities and  participant's personal goals.    Expected Outcomes Short Term: Increase workloads from initial exercise prescription for resistance, speed, and METs.;Short Term: Perform resistance training exercises routinely during rehab and add in resistance training at home;Long Term: Improve cardiorespiratory fitness, muscular endurance and strength as measured by increased METs and functional capacity ( ) Short Term: Increase workloads from initial exercise prescription for resistance, speed, and METs.;Short Term: Perform resistance training exercises routinely during rehab and add in resistance training at home;Long Term: Improve cardiorespiratory fitness, muscular endurance and strength as measured by increased METs and functional capacity ( ) Short Term: Increase workloads from initial exercise prescription for resistance, speed, and METs.;Short Term: Perform resistance training exercises routinely during rehab and add in resistance training at home;Long Term: Improve cardiorespiratory fitness, muscular endurance and strength as measured by increased METs and functional capacity ( ) Short Term: Increase workloads from initial exercise prescription for resistance, speed, and METs.;Short Term: Perform resistance training exercises routinely during rehab and add in resistance training at home;Long Term: Improve cardiorespiratory fitness, muscular endurance and strength as measured by increased METs and functional capacity ( )    Able to understand and use rate of perceived exertion (RPE) scale Yes Yes Yes Yes    Intervention Provide education and explanation on how to use RPE scale Provide education and explanation on how to use RPE scale Provide education and explanation on how to use RPE scale Provide education and explanation on how to use RPE scale    Expected Outcomes Short Term: Able to use RPE daily in rehab to express subjective intensity level;Long Term:  Able to use RPE to guide intensity level when  exercising independently Short Term: Able to use RPE daily in rehab to express subjective intensity level;Long Term:  Able to use RPE to guide intensity level when exercising independently Short Term: Able to use RPE daily in rehab to express subjective intensity level;Long Term:  Able to use RPE to guide intensity level when exercising independently Short Term: Able to use RPE daily in rehab to express subjective intensity level;Long Term:  Able to use RPE to guide intensity level when exercising independently    Able to understand and use Dyspnea scale Yes Yes Yes Yes    Intervention Provide education and explanation on how to use Dyspnea scale Provide education and explanation on how to use Dyspnea scale Provide education and explanation on how to use Dyspnea scale Provide education and explanation on how to use Dyspnea scale    Expected Outcomes Short Term: Able to use Dyspnea scale daily in rehab to express subjective sense of shortness of breath during exertion;Long Term: Able to use Dyspnea scale to guide intensity level when exercising independently Short Term: Able to use Dyspnea scale daily in rehab to express subjective sense of shortness of breath during exertion;Long Term: Able to use Dyspnea scale to guide intensity level when exercising independently Short Term: Able to use Dyspnea scale daily in rehab  to express subjective sense of shortness of breath during exertion;Long Term: Able to use Dyspnea scale to guide intensity level when exercising independently Short Term: Able to use Dyspnea scale daily in rehab to express subjective sense of shortness of breath during exertion;Long Term: Able to use Dyspnea scale to guide intensity level when exercising independently    Knowledge and understanding of Target Heart Rate Range (THRR) Yes Yes Yes Yes    Intervention Provide education and explanation of THRR including how the numbers were predicted and where they are located for reference Provide  education and explanation of THRR including how the numbers were predicted and where they are located for reference Provide education and explanation of THRR including how the numbers were predicted and where they are located for reference Provide education and explanation of THRR including how the numbers were predicted and where they are located for reference    Expected Outcomes Short Term: Able to state/look up THRR;Short Term: Able to use daily as guideline for intensity in rehab;Long Term: Able to use THRR to govern intensity when exercising independently Short Term: Able to state/look up THRR;Short Term: Able to use daily as guideline for intensity in rehab;Long Term: Able to use THRR to govern intensity when exercising independently Short Term: Able to state/look up THRR;Short Term: Able to use daily as guideline for intensity in rehab;Long Term: Able to use THRR to govern intensity when exercising independently Short Term: Able to state/look up THRR;Short Term: Able to use daily as guideline for intensity in rehab;Long Term: Able to use THRR to govern intensity when exercising independently    Understanding of Exercise Prescription Yes Yes Yes Yes    Intervention Provide education, explanation, and written materials on patient's individual exercise prescription Provide education, explanation, and written materials on patient's individual exercise prescription Provide education, explanation, and written materials on patient's individual exercise prescription Provide education, explanation, and written materials on patient's individual exercise prescription    Expected Outcomes Short Term: Able to explain program exercise prescription;Long Term: Able to explain home exercise prescription to exercise independently Short Term: Able to explain program exercise prescription;Long Term: Able to explain home exercise prescription to exercise independently Short Term: Able to explain program exercise  prescription;Long Term: Able to explain home exercise prescription to exercise independently Short Term: Able to explain program exercise prescription;Long Term: Able to explain home exercise prescription to exercise independently             Exercise Goals Re-Evaluation :  Exercise Goals Re-Evaluation     Row Name 01/18/23 0919 02/12/23 1217 03/13/23 1601 04/11/23 1025 05/09/23 1050     Exercise Goal Re-Evaluation   Exercise Goals Review Increase Physical Activity;Able to understand and use Dyspnea scale;Understanding of Exercise Prescription;Increase Strength and Stamina;Knowledge and understanding of Target Heart Rate Range (THRR);Able to understand and use rate of perceived exertion (RPE) scale Increase Physical Activity;Able to understand and use Dyspnea scale;Understanding of Exercise Prescription;Increase Strength and Stamina;Knowledge and understanding of Target Heart Rate Range (THRR);Able to understand and use rate of perceived exertion (RPE) scale Increase Physical Activity;Able to understand and use Dyspnea scale;Understanding of Exercise Prescription;Increase Strength and Stamina;Knowledge and understanding of Target Heart Rate Range (THRR);Able to understand and use rate of perceived exertion (RPE) scale Increase Physical Activity;Able to understand and use Dyspnea scale;Understanding of Exercise Prescription;Increase Strength and Stamina;Knowledge and understanding of Target Heart Rate Range (THRR);Able to understand and use rate of perceived exertion (RPE) scale Increase Physical Activity;Able to understand and use Dyspnea scale;Understanding  of Exercise Prescription;Increase Strength and Stamina;Knowledge and understanding of Target Heart Rate Range (THRR);Able to understand and use rate of perceived exertion (RPE) scale   Comments Pt is scheduled to begin exercise 8/13. Will progress as tolerated. Adaleena has completed 1 exercise session. She exercises for 15 min on the upright  elliptical and treadmill. Nga averages 4.3 METs at level 1 and speed 1 and 2.4 METs on the treadmill. She performs the warmup and cooldown standing without limitations. Elverna has missed several sessions due to her surgery. She plans to return next month at a earlier class time that will benefit her. Will continue to monitor and progress as able. Tazmin has completed 6 exercise session. She exercises for 15 min on the upright elliptical and treadmill. Kolette averages 4.0 METs at level 2 and speed 1 and 3.26 METs on the treadmill. She performs the warmup and cooldown standing without limitations. She has increased her workload for the upright elliptical as METs have remained relatively the same. Her workload has increased and decreased on the treadmill. Leasa does not feel a difference since starting rehab. Will continue to monitor and progress as able. Reginia has completed 12 exercise sessions. She exercises for 15 min on the upright elliptical and treadmill. Ticara averages 4.4 METs at level 2 and speed 3 and 3.3 METs at 2.7 mph and 1.5% incline on the treadmill. She performs the warmup and cooldown standing without limitations. She has increased her workload for both exercise modes as METs have increased. Mana tolerates progressions well. Will continue to monitor and progress as able. Tensley has completed 20 exercise sessions. She exercises for 15 min on the upright elliptical and treadmill. Laneisha averages 4.6 METs at level 2 and speed 3 and 3.3 METs at 2.7 mph and 2.0% incline on the treadmill. She performs the warmup and cooldown standing without limitations. She slightly increased her workload for the treadmill. She feel she is unable to increase on the upright elliptical at this time. Uriel still continues to exercise at the gym with her husband. Will continue to monitor and progress as able.   Expected Outcomes Through exercise at rehab and home, the patient will decrease shortness of breath with  daily activities and feel confident in carrying out an exercise regimen at home Through exercise at rehab and home, the patient will decrease shortness of breath with daily activities and feel confident in carrying out an exercise regimen at home Through exercise at rehab and home, the patient will decrease shortness of breath with daily activities and feel confident in carrying out an exercise regimen at home Through exercise at rehab and home, the patient will decrease shortness of breath with daily activities and feel confident in carrying out an exercise regimen at home Through exercise at rehab and home, the patient will decrease shortness of breath with daily activities and feel confident in carrying out an exercise regimen at home            Discharge Exercise Prescription (Final Exercise Prescription Changes):  Exercise Prescription Changes - 05/15/23 0900       Response to Exercise   Blood Pressure (Admit) 116/70    Blood Pressure (Exercise) 130/80    Blood Pressure (Exit) 108/78    Heart Rate (Admit) 96 bpm    Heart Rate (Exercise) 118 bpm    Heart Rate (Exit) 102 bpm    Oxygen Saturation (Admit) 98 %    Oxygen Saturation (Exercise) 96 %    Oxygen Saturation (Exit) 97 %  Rating of Perceived Exertion (Exercise) 12    Perceived Dyspnea (Exercise) 1    Duration Continue with 30 min of aerobic exercise without signs/symptoms of physical distress.    Intensity THRR unchanged      Progression   Progression Continue to progress workloads to maintain intensity without signs/symptoms of physical distress.      Resistance Training   Training Prescription Yes    Weight black bands    Reps 10-15    Time 10 Minutes      Treadmill   MPH 2.7    Grade 2    Minutes 15    METs 3.7      Elliptical   Level 3    Speed 2    Minutes 15    METs 4.2             Nutrition:  Target Goals: Understanding of nutrition guidelines, daily intake of sodium 1500mg , cholesterol  200mg , calories 30% from fat and 7% or less from saturated fats, daily to have 5 or more servings of fruits and vegetables.  Biometrics:  Pre Biometrics - 01/12/23 0909       Pre Biometrics   Grip Strength 20 kg              Nutrition Therapy Plan and Nutrition Goals:  Nutrition Therapy & Goals - 05/08/23 1127       Nutrition Therapy   Diet General Healthy Diet      Personal Nutrition Goals   Nutrition Goal Patient to improve diet quality by using the plate method as a guide for meal planning to include lean protein/plant protein, fruits, vegetables, whole grains, nonfat dairy as part of a well-balanced diet.   goal in progress.   Personal Goal #2 Patient to identify strategies for weight loss of 0.5-2.0# per week.   goal not met.   Comments Goals in progress. She did start Moujaro in April 2024 at 198#; she has maintained her weight since starting with our program. Suda has started to transition to more plant based eating to aid with inflammation and lose weight. She reports struggling with weight for most of adulthood. She does report improved fasting/am blood sugars <100. We have discussed multiple strategies for weight loss including the plate method as a guide for meal planning/portion sizes, protein supplements, benefits of high fiber/high protein intake. Her A1c has improved to a pre-diabetic range (6.1); mounjaro was increased at 10/2 endocrinology appointment. Lipid panel remains elevated but improved overall. Answered patient questions regarding diet and acid reflux/heartburn; she has reduced many common food triggers such as coffee with relief/improvement of symptoms. Javiah will benefit from participation in pulmonary rehab for nutrition, exercise, and lifestyle modification.      Intervention Plan   Intervention Prescribe, educate and counsel regarding individualized specific dietary modifications aiming towards targeted core components such as weight, hypertension, lipid  management, diabetes, heart failure and other comorbidities.;Nutrition handout(s) given to patient.    Expected Outcomes Short Term Goal: Understand basic principles of dietary content, such as calories, fat, sodium, cholesterol and nutrients.;Long Term Goal: Adherence to prescribed nutrition plan.             Nutrition Assessments:  Nutrition Assessments - 05/15/23 0824       Rate Your Plate Scores   Pre Score 68            MEDIFICTS Score Key: >=70 Need to make dietary changes  40-70 Heart Healthy Diet <= 40 Therapeutic Level Cholesterol Diet  Picture Your Plate Scores: <86 Unhealthy dietary pattern with much room for improvement. 41-50 Dietary pattern unlikely to meet recommendations for good health and room for improvement. 51-60 More healthful dietary pattern, with some room for improvement.  >60 Healthy dietary pattern, although there may be some specific behaviors that could be improved.    Nutrition Goals Re-Evaluation:  Nutrition Goals Re-Evaluation     Row Name 01/30/23 1423 03/01/23 1542 04/12/23 0859 05/08/23 1127       Goals   Current Weight 190 lb 7.6 oz (86.4 kg) 189 lb 6 oz (85.9 kg) 191 lb 12.8 oz (87 kg) 191 lb 12.8 oz (87 kg)    Comment A1c 6.6, history of elevated LFTS but WNL at this time no new labs; most recent labs A1c 6.6, history of elevated LFTS but WNL at this time A1c 5.7; history of elevated LFTS but WNL at this time A1c 6.1, LDL 150, Cholesterol 269, AST/ALT WNL    Expected Outcome Kerrigan is motivated to transition to more plant based eating to aid with inflammation and lose weight. She reports struggling with weight for most of adulthood. She did start Moujaro in April 2024 at 198#. She does report improved fasting/am blood sugars 100-120s. She does report eating out for lunch more often. Kyiah will benefit from participation in pulmonary rehab for nutrition, exercise, and lifestyle modification. Goals in progress. Shritha has only completed  three pulmonary rehab sessions due to other medical procedures/appointments. She did start Moujaro in April 2024 at 198#; she is down 3.3# since starting with our program. Shayli has started to transition to more plant based eating to aid with inflammation and lose weight. She reports struggling with weight for most of adulthood. She does report improved fasting/am blood sugars <100. We discussed multiple strategies for weight loss including the plate method as a guide for meal planning/portion sizes, protein supplements, benefits of high fiber/high protein intake. Mario will benefit from participation in pulmonary rehab for nutrition, exercise, and lifestyle modification. Goals in progress. She did start Moujaro in April 2024 at 198#; she has maintained her weight since starting with our program. Lunette has started to transition to more plant based eating to aid with inflammation and lose weight. She reports struggling with weight for most of adulthood. She does report improved fasting/am blood sugars <100. We have discussed multiple strategies for weight loss including the plate method as a guide for meal planning/portion sizes, protein supplements, benefits of high fiber/high protein intake. Her A1c has improved to a pre-diabetic range; mounjaro was increased at 10/2 endocrinology appointment. She did not start novolog insulin or CGM at this time. Shayonna will benefit from participation in pulmonary rehab for nutrition, exercise, and lifestyle modification. Goals in progress. She did start Moujaro in April 2024 at 198#; she has maintained her weight since starting with our program. Lucye has started to transition to more plant based eating to aid with inflammation and lose weight. She reports struggling with weight for most of adulthood. She does report improved fasting/am blood sugars <100. We have discussed multiple strategies for weight loss including the plate method as a guide for meal planning/portion  sizes, protein supplements, benefits of high fiber/high protein intake. Her A1c has improved to a pre-diabetic range (6.1); mounjaro was increased at 10/2 endocrinology appointment. Lipid panel remains elevated but improved overall. Answered patient questions regarding diet and acid reflux/heartburn; she has reduced many common food triggers such as coffee with relief/improvement of symptoms. Jerianne will benefit from participation  in pulmonary rehab for nutrition, exercise, and lifestyle modification.             Nutrition Goals Discharge (Final Nutrition Goals Re-Evaluation):  Nutrition Goals Re-Evaluation - 05/08/23 1127       Goals   Current Weight 191 lb 12.8 oz (87 kg)    Comment A1c 6.1, LDL 150, Cholesterol 269, AST/ALT WNL    Expected Outcome Goals in progress. She did start Moujaro in April 2024 at 198#; she has maintained her weight since starting with our program. Josilynn has started to transition to more plant based eating to aid with inflammation and lose weight. She reports struggling with weight for most of adulthood. She does report improved fasting/am blood sugars <100. We have discussed multiple strategies for weight loss including the plate method as a guide for meal planning/portion sizes, protein supplements, benefits of high fiber/high protein intake. Her A1c has improved to a pre-diabetic range (6.1); mounjaro was increased at 10/2 endocrinology appointment. Lipid panel remains elevated but improved overall. Answered patient questions regarding diet and acid reflux/heartburn; she has reduced many common food triggers such as coffee with relief/improvement of symptoms. Donnielle will benefit from participation in pulmonary rehab for nutrition, exercise, and lifestyle modification.             Psychosocial: Target Goals: Acknowledge presence or absence of significant depression and/or stress, maximize coping skills, provide positive support system. Participant is able to  verbalize types and ability to use techniques and skills needed for reducing stress and depression.  Initial Review & Psychosocial Screening:  Initial Psych Review & Screening - 01/12/23 0922       Initial Review   Current issues with None Identified      Family Dynamics   Good Support System? Yes    Comments Arfa states she has good support from her spouse      Barriers   Psychosocial barriers to participate in program There are no identifiable barriers or psychosocial needs.             Quality of Life Scores:  Scores of 19 and below usually indicate a poorer quality of life in these areas.  A difference of  2-3 points is a clinically meaningful difference.  A difference of 2-3 points in the total score of the Quality of Life Index has been associated with significant improvement in overall quality of life, self-image, physical symptoms, and general health in studies assessing change in quality of life.  PHQ-9: Review Flowsheet       05/15/2023 01/12/2023 12/15/2019 11/19/2018  Depression screen PHQ 2/9  Decreased Interest 0 0 3 0  Down, Depressed, Hopeless 0 0 2 0  PHQ - 2 Score 0 0 5 0  Altered sleeping 0 0 3 1  Tired, decreased energy 0 0 3 1  Change in appetite 0 0 3 0  Feeling bad or failure about yourself  0 0 0 0  Trouble concentrating 0 0 0 0  Moving slowly or fidgety/restless 0 0 1 0  Suicidal thoughts 0 0 0 0  PHQ-9 Score 0 0 15 2  Difficult doing work/chores Not difficult at all - Not difficult at all -    Details           Interpretation of Total Score  Total Score Depression Severity:  1-4 = Minimal depression, 5-9 = Mild depression, 10-14 = Moderate depression, 15-19 = Moderately severe depression, 20-27 = Severe depression   Psychosocial Evaluation and Intervention:  Psychosocial  Evaluation - 01/12/23 0926       Psychosocial Evaluation & Interventions   Comments Zamyah denies any psychosocial barriers or concerns    Expected Outcomes For  Gioia to participate in PR without any psychosocial barriers or concerns    Continue Psychosocial Services  No Follow up required             Psychosocial Re-Evaluation:  Psychosocial Re-Evaluation     Row Name 01/19/23 1457 02/12/23 1334 03/12/23 0924 04/12/23 1537 05/09/23 0850     Psychosocial Re-Evaluation   Current issues with None Identified None Identified None Identified None Identified None Identified   Comments No changes since Gari's orientation appointment. Areen will start the program on 8/13/20242 Bular has only attended one session. She is scheduled to return to class on 9/10 due to medical procedure. No new psychosocial barriers or concers. Mamediarra denies any psychosocial barriers or concerns. She visits the beach and exercises with her husband for stress relief. Mariona denies any psychosocial barriers or concerns at this time Leslieanne continues to deny any psychosocial barriers or concerns at this time.   Expected Outcomes For Zohara to participate in RP without any psychosocial barriers or concerns For Sofija to participate in RP without any psychosocial barriers or concerns For Weslyn to participate in PR without any psychosocial barriers or concerns For Anjelicia to participate in PR without any psychosocial barriers or concerns For Gittel to participate in PR without any psychosocial barriers or concerns   Interventions Encouraged to attend Pulmonary Rehabilitation for the exercise Encouraged to attend Pulmonary Rehabilitation for the exercise Encouraged to attend Pulmonary Rehabilitation for the exercise Encouraged to attend Pulmonary Rehabilitation for the exercise Encouraged to attend Pulmonary Rehabilitation for the exercise   Continue Psychosocial Services  No Follow up required No Follow up required No Follow up required No Follow up required No Follow up required            Psychosocial Discharge (Final Psychosocial Re-Evaluation):  Psychosocial Re-Evaluation -  05/09/23 0850       Psychosocial Re-Evaluation   Current issues with None Identified    Comments Chrishauna continues to deny any psychosocial barriers or concerns at this time.    Expected Outcomes For Princella to participate in PR without any psychosocial barriers or concerns    Interventions Encouraged to attend Pulmonary Rehabilitation for the exercise    Continue Psychosocial Services  No Follow up required             Education: Education Goals: Education classes will be provided on a weekly basis, covering required topics. Participant will state understanding/return demonstration of topics presented.  Learning Barriers/Preferences:  Learning Barriers/Preferences - 01/12/23 1002       Learning Barriers/Preferences   Learning Barriers None    Learning Preferences Individual Instruction;Written Material;Verbal Instruction             Education Topics: Know Your Numbers Group instruction that is supported by a PowerPoint presentation. Instructor discusses importance of knowing and understanding resting, exercise, and post-exercise oxygen saturation, heart rate, and blood pressure. Oxygen saturation, heart rate, blood pressure, rating of perceived exertion, and dyspnea are reviewed along with a normal range for these values.  Flowsheet Row PULMONARY REHAB OTHER RESPIRATORY from 03/22/2023 in [facility]  Date 03/22/23  Educator EP  Instruction Review Code 1- Verbalizes Understanding       Exercise for the Pulmonary Patient Group instruction that is supported by a  PowerPoint presentation. Instructor discusses benefits of exercise, core components of exercise, frequency, duration, and intensity of an exercise routine, importance of utilizing pulse oximetry during exercise, safety while exercising, and options of places to exercise outside of rehab.  Flowsheet Row PULMONARY REHAB OTHER RESPIRATORY from 03/15/2023 in [facility]  Date 03/15/23  Educator eP  Instruction Review Code 1- Verbalizes Understanding       MET Level  Group instruction provided by PowerPoint, verbal discussion, and written material to support subject matter. Instructor reviews what METs are and how to increase METs.    Pulmonary Medications Verbally interactive group education provided by instructor with focus on inhaled medications and proper administration. Flowsheet Row PULMONARY REHAB OTHER RESPIRATORY from 03/08/2023 in [facility]  Date 03/08/23  Educator RT  Instruction Review Code 1- Verbalizes Understanding       Anatomy and Physiology of the Respiratory System Group instruction provided by PowerPoint, verbal discussion, and written material to support subject matter. Instructor reviews respiratory cycle and anatomical components of the respiratory system and their functions. Instructor also reviews differences in obstructive and restrictive respiratory diseases with examples of each.  Flowsheet Row PULMONARY REHAB OTHER RESPIRATORY from 03/01/2023 in [facility]  Date 03/01/23  Educator RT  Instruction Review Code 1- Verbalizes Understanding       Oxygen Safety Group instruction provided by PowerPoint, verbal discussion, and written material to support subject matter. There is an overview of "What is Oxygen" and "Why do we need it".  Instructor also reviews how to create a safe environment for oxygen use, the importance of using oxygen as prescribed, and the risks of noncompliance. There is a brief discussion on traveling with oxygen and resources the patient may utilize. Flowsheet Row PULMONARY REHAB OTHER RESPIRATORY from 03/29/2023 in [facility]  Date 03/29/23  Educator RN   Instruction Review Code 1- Verbalizes Understanding       Oxygen Use Group instruction provided by PowerPoint, verbal discussion, and written material to discuss how supplemental oxygen is prescribed and different types of oxygen supply systems. Resources for more information are provided.  Flowsheet Row PULMONARY REHAB OTHER RESPIRATORY from 04/05/2023 in [facility]  Date 04/05/23  Educator RT  Instruction Review Code 1- Verbalizes Understanding       Breathing Techniques Group instruction that is supported by demonstration and informational handouts. Instructor discusses the benefits of pursed lip and diaphragmatic breathing and detailed demonstration on how to perform both.  Flowsheet Row PULMONARY REHAB OTHER RESPIRATORY from 04/12/2023 in [facility]  Date 04/12/23  Educator RN  Instruction Review Code 1- Verbalizes Understanding        Risk Factor Reduction Group instruction that is supported by a PowerPoint presentation. Instructor discusses the definition of a risk factor, different risk factors for pulmonary disease, and how the heart and lungs work together. Flowsheet Row PULMONARY REHAB OTHER RESPIRATORY from 05/03/2023 in [facility]  Date 05/03/23  Educator EP  Instruction Review Code 1- Verbalizes Understanding       Pulmonary Diseases Group instruction provided by PowerPoint, verbal discussion, and written material to support subject matter. Instructor gives an overview  of the different type of pulmonary diseases. There is also a discussion on risk factors and symptoms as well as ways to manage the diseases.   Stress and Energy Conservation Group instruction provided by PowerPoint, verbal discussion, and written material to support subject matter. Instructor gives an overview of stress  and the impact it can have on the body. Instructor also reviews ways to reduce stress. There is also a discussion on energy conservation and ways to conserve energy throughout the day. Flowsheet Row PULMONARY REHAB OTHER RESPIRATORY from 04/19/2023 in [facility]  Date 04/19/23  Educator RN  Instruction Review Code 1- Verbalizes Understanding       Warning Signs and Symptoms Group instruction provided by PowerPoint, verbal discussion, and written material to support subject matter. Instructor reviews warning signs and symptoms of stroke, heart attack, cold and flu. Instructor also reviews ways to prevent the spread of infection. Flowsheet Row PULMONARY REHAB OTHER RESPIRATORY from 04/26/2023 in [facility]  Date 04/26/23  Educator RN  Instruction Review Code 1- Verbalizes Understanding       Other Education Group or individual verbal, written, or video instructions that support the educational goals of the pulmonary rehab program.    Knowledge Questionnaire Score:  Knowledge Questionnaire Score - 05/15/23 0820       Knowledge Questionnaire Score   Post Score 18/18             Core Components/Risk Factors/Patient Goals at Admission:  Personal Goals and Risk Factors at Admission - 01/12/23 0919       Core Components/Risk Factors/Patient Goals on Admission    Weight Management Weight Loss;Yes    Intervention Weight Management: Develop a combined nutrition and exercise program designed to reach desired caloric intake, while maintaining appropriate intake of nutrient and fiber, sodium and fats, and appropriate energy expenditure required for the weight goal.;Weight Management: Provide education and appropriate resources to help participant work on and attain dietary goals.;Weight Management/Obesity: Establish reasonable short term and long term weight  goals.;Obesity: Provide education and appropriate resources to help participant work on and attain dietary goals.    Admit Weight 192 lb 9.6 oz (87.4 kg)    Improve shortness of breath with ADL's Yes    Intervention Provide education, individualized exercise plan and daily activity instruction to help decrease symptoms of SOB with activities of daily living.    Expected Outcomes Short Term: Improve cardiorespiratory fitness to achieve a reduction of symptoms when performing ADLs;Long Term: Be able to perform more ADLs without symptoms or delay the onset of symptoms    Increase knowledge of respiratory medications and ability to use respiratory devices properly  Yes    Intervention Provide education and demonstration as needed of appropriate use of medications, inhalers, and oxygen therapy.    Expected Outcomes Short Term: Achieves understanding of medications use. Understands that oxygen is a medication prescribed by physician. Demonstrates appropriate use of inhaler and oxygen therapy.;Long Term: Maintain appropriate use of medications, inhalers, and oxygen therapy.             Core Components/Risk Factors/Patient Goals Review:   Goals and Risk Factor Review     Row Name 01/22/23 1551 02/12/23 1335 03/12/23 0928 04/12/23 1537 05/09/23 0851     Core Components/Risk Factors/Patient Goals Review   Personal Goals Review Improve shortness of breath with ADL's;Develop more efficient breathing techniques such as purse lipped breathing  and diaphragmatic breathing and practicing self-pacing with activity.;Increase knowledge of respiratory medications and ability to use respiratory devices properly.;Weight Management/Obesity Weight Management/Obesity;Improve shortness of breath with ADL's;Develop more efficient breathing techniques such as purse lipped breathing and diaphragmatic breathing and practicing self-pacing with activity.;Increase knowledge of respiratory medications and ability to use  respiratory devices properly. Weight Management/Obesity;Improve shortness of breath with ADL's;Develop more efficient breathing techniques such as purse lipped breathing and diaphragmatic breathing and practicing self-pacing with activity.;Increase knowledge of respiratory medications and ability to use respiratory devices properly. Weight Management/Obesity;Improve shortness of breath with ADL's --   Review Unable to assess, Keyonta has not yet started the program but is scheduled to start on 01/30/2023 Janitza has attended one session so far. She is scheduled to return to class on 9/10. Goals are in progress. Goal in progress for weight loss. Lorijean has not had success yet in losing weight but is still working with our dietician. She hopes that exercising will help. She has just started her journey in class and has completed 5 sessions so far. Goal met on developing more efficient breathing techniques such as purse lipped breathing and diaphragmatic breathing; and practicing self-pacing with activity. Sacheen can initiate PLB and can self-pace herself while on the elliptical and treadmill. Goal in progress on improving her shortness of breath with ADLs. She has increased both her workload and METs while maintaining her oxygen saturation >88% on room air. Goal met for increasing her knowledge of respiratory medications and ability to use respiratory devices properly. She has correctly demonstrated and voiced when to use her medications with our respiratory therapist. Clotile will continue to benefit from PR for nutrition, education, exercise, and lifestyle modification. Goal in progress for weight loss. Lona has not had success yet in losing weight but is still working with our dietician. Goal in progress on improving her shortness of breath with ADLs. She has increased both her workload and METs while maintaining her oxygen saturation >88% on room air. Britanee will continue to benefit from PR for nutrition,  education, exercise, and lifestyle modification. Goal in progress for weight loss. Eilish has not had success yet in losing weight but is still working with our dietitian to meet her goals. Goal in progress on improving her shortness of breath with ADLs. She has increased both her workload and METs while maintaining her oxygen saturation >88% on room air. Koty will continue to benefit from PR for nutrition, education, exercise, and lifestyle modification.   Expected Outcomes See admission goals See admission goals For Delvina to lose weight and improve her SOB with ADLs For Rayvn to lose weight and improve her SOB with ADLs For Kyrene to lose weight and improve her SOB with ADLs            Core Components/Risk Factors/Patient Goals at Discharge (Final Review):   Goals and Risk Factor Review - 05/09/23 0851       Core Components/Risk Factors/Patient Goals Review   Review Goal in progress for weight loss. Shelea has not had success yet in losing weight but is still working with our dietitian to meet her goals. Goal in progress on improving her shortness of breath with ADLs. She has increased both her workload and METs while maintaining her oxygen saturation >88% on room air. Keysa will continue to benefit from PR for nutrition, education, exercise, and lifestyle modification.    Expected Outcomes For Sherika to lose weight and improve her SOB with ADLs  ITP Comments:Pt is making expected progress toward Pulmonary Rehab goals after completing 21 session(s). Recommend continued exercise, life style modification, education, and utilization of breathing techniques to increase stamina and strength, while also decreasing shortness of breath with exertion.  Dr. Mechele Collin is Medical Director for Pulmonary Rehab at [facility].

## 2023-05-21 ENCOUNTER — Ambulatory Visit (INDEPENDENT_AMBULATORY_CARE_PROVIDER_SITE_OTHER): Payer: BC Managed Care – PPO | Admitting: Pulmonary Disease

## 2023-05-21 ENCOUNTER — Encounter (HOSPITAL_BASED_OUTPATIENT_CLINIC_OR_DEPARTMENT_OTHER): Payer: Self-pay | Admitting: Pulmonary Disease

## 2023-05-21 VITALS — BP 112/70 | HR 99 | Ht 61.75 in | Wt 190.0 lb

## 2023-05-21 DIAGNOSIS — D869 Sarcoidosis, unspecified: Secondary | ICD-10-CM | POA: Diagnosis not present

## 2023-05-21 DIAGNOSIS — R0600 Dyspnea, unspecified: Secondary | ICD-10-CM | POA: Diagnosis not present

## 2023-05-21 DIAGNOSIS — Z79899 Other long term (current) drug therapy: Secondary | ICD-10-CM

## 2023-05-21 MED ORDER — BREZTRI AEROSPHERE 160-9-4.8 MCG/ACT IN AERO: 2.0000 | INHALATION_SPRAY | Freq: Two times a day (BID) | RESPIRATORY_TRACT | 3 refills | Status: AC

## 2023-05-21 MED ORDER — METHOTREXATE SODIUM 2.5 MG PO TABS: 20.0000 mg | ORAL_TABLET | ORAL | 2 refills | Status: DC

## 2023-05-21 NOTE — Progress Notes (Signed)
Synopsis: Referred in April 2024 for abnormal PET scan, adenopathy by Fatima Sanger, FNP  Subjective:   PATIENT ID: [name] GENDER: female DOB: [birth year], MRN: [id number]  No chief complaint on file.  Synopsis: This is a 57 year old female, past medical history of back pain, constipation, type 2 diabetes, hypertension, kidney stones.  Presents with a new diagnosis of sarcoidosis after a inguinal node excision that shows not nonnecrotizing granulomas.  She has abnormal pet imaging that shows mediastinal and hilar adenopathy as well as splenic involvement. She reports that she has had difficulty concentrating, fatigue and brain fog for over a year.  She was started on prednisone and methotrexate by rheumatology.  She has had an eye exam and there is no evidence of uveitis.     11/27/22 She is a new patient to me to establish care for sarcoid management. She has been on methotrexate 20 mg weekly since 10/22/22. This was started by Rheumatology. Currently still on prednisone 10 mg, has been this medication since March 2024. Feels fatigued prior to her weekly dosing of methotrexate on Sunday and then will feel worse after taking it on Mondays. She currently is followed by GI for belching and nausea and has had negative work-up. She continues to have shortness of breath with exertion. She reports brain fog and balance issues sometimes. Denies falling. Will need to hold onto things due to floating sensation at times.   02/01/23 Since our last visit she has been enrolled in Pulmonary rehab. She continues methotrexate 20 mg and folic acid daily. She felt benefit with Breztri ONE puff twice a day. Requesting refills because it helped with her shortness of breath and tolerating walking up steps easier. Denies cough or wheezing. Cough has nearly resolved. Did feel more fatigued when stepped down to prednisone 5 mg. Started taking advil for general body aches and pains. Continues to have night  sweats.  05/21/23 She has been participating in Pulmonary rehab. She has shortness of breath with activity. No wheezing or coughing. Not using Breztri as consistently. She is on methotrexate 20 mg weekly and folic acid daily. Started Acthar in 02/2023 however stopped in 03/2023 and headaches improved. Remains on prednisone 10 mg daily. Since our last visit she has had normal ONO.   Past Medical History:  Diagnosis Date   Back pain    Constipation    Contact dermatitis 02/11/2021   Elevated LFTs 12/12/2018   Fatty liver    Fen-phen history 11/24/2018   Generalized anxiety disorder 09/25/2022   Hypercholesteremia    Hypertension    Intestinal bacterial overgrowth    Joint pain    Kidney stones    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   Lumbar pain 07/12/2021   Lymphadenopathy 06/13/2022   Migraines    Mild cognitive impairment 02/02/2023   Mixed hyperlipidemia 12/12/2018   Morbid obesity 11/24/2018   Myalgia, unspecified site 06/21/2021   Nausea in adult    Obstructive sleep apnea    mild, no CPAP   Other allergic rhinitis 04/04/2022   Pain in thoracic spine 07/18/2021   Sarcoidosis    Sleep disturbance 10/08/2019   Splenic lesion 05/20/2022   Subclinical hypothyroidism 09/2009   Type 2 diabetes mellitus without complication, without long-term current use of insulin 05/05/2022     Family History  Problem Relation Age of Onset   Diabetes Mother    Hyperlipidemia Mother    Thyroid disease Mother    Anxiety disorder Mother  Obesity Mother    Aortic stenosis Mother    Arthritis-Osteo Sister    Colon cancer Paternal Grandmother    Heart disease Paternal Grandfather    Pancreatic cancer Neg Hx    Stomach cancer Neg Hx    Liver disease Neg Hx    Esophageal cancer Neg Hx      Past Surgical History:  Procedure Laterality Date   BLADDER SUSPENSION N/A 06/05/2022   Procedure: TRANSVAGINAL TAPE (TVT) PROCEDURE;  Surgeon: Marguerita Beards, MD;  Location: [facility];  Service: Gynecology;  Laterality: N/A;  total time requested is 1 hour   COLONOSCOPY     CYSTOSCOPY N/A 06/05/2022   Procedure: CYSTOSCOPY;  Surgeon: Marguerita Beards, MD;  Location: [facility];  Service: Gynecology;  Laterality: N/A;   LITHOTRIPSY     LYMPH NODE DISSECTION     NOVASURE ABLATION  12/18/2006   RADIOACTIVE SEED GUIDED EXCISIONAL BREAST BIOPSY Right 05/30/2021   Procedure: RADIOACTIVE SEED GUIDED EXCISIONAL RIGHT BREAST BIOPSY;  Surgeon: Emelia Loron, MD;  Location: [facility];  Service: General;  Laterality: Right;   ROTATOR CUFF REPAIR Right 06/20/2007    Social History   Socioeconomic History   Marital status: Married    Spouse name: Alinda Money   Number of children: 2   Years of education: 16   Highest education level: Bachelor's degree (e.g., BA, AB, BS)  Occupational History   Occupation: IT trainer  Tobacco Use   Smoking status: Never    Passive exposure: Never   Smokeless tobacco: Never  Vaping Use   Vaping status: Never Used  Substance and Sexual Activity   Alcohol use: No    Alcohol/week: 0.0 standard drinks of alcohol   Drug use: No   Sexual activity: Yes    Partners: Male    Birth control/protection: Surgical    Comment: Ablation, Vasectomy  Other Topics Concern   Not on file  Social History Narrative   Right handed   Drinks coffee   Lives with husband   Two story home   unemployed   Social Determinants of Health   Financial Resource Strain: Low Risk  (04/23/2023)   Overall Financial Resource Strain (CARDIA)    Difficulty of Paying Living Expenses: Not hard at all  Food Insecurity: No Food Insecurity (04/23/2023)   Hunger Vital Sign    Worried About Running Out of Food in the Last Year: Never true    Ran Out of Food in the Last Year: Never true  Transportation Needs: No Transportation Needs (04/23/2023)   PRAPARE - Administrator, Civil Service (Medical): No    Lack of Transportation  (Non-Medical): No  Physical Activity: Unknown (04/23/2023)   Exercise Vital Sign    Days of Exercise per Week: 3 days    Minutes of Exercise per Session: Not on file  Stress: No Stress Concern Present (04/23/2023)   [employer] of Occupational Health - Occupational Stress Questionnaire    Feeling of Stress : Not at all  Social Connections: Moderately Isolated (04/23/2023)   Social Connection and Isolation Panel [NHANES]    Frequency of Communication with Friends and Family: More than three times a week    Frequency of Social Gatherings with Friends and Family: Once a week    Attends Religious Services: Never    Database administrator or Organizations: No    Attends Engineer, structural: Not on file    Marital Status: Married  Intimate  Partner Violence: Unknown (09/23/2021)   [facility]   HITS    Physically Hurt: Not on file    Insult or Talk Down To: Not on file    Threaten Physical Harm: Not on file    Scream or Curse: Not on file     Allergies  Allergen Reactions   Tizanidine Hcl Other (See Comments)    Cant move Cant move   Clarithromycin Nausea Only    Severe stomach cramps Severe stomach cramps     Outpatient Medications Prior to Visit  Medication Sig Dispense Refill   aspirin EC 81 MG tablet Take 81 mg by mouth daily. Swallow whole.     Budeson-Glycopyrrol-Formoterol (BREZTRI AEROSPHERE) 160-9-4.8 MCG/ACT AERO Inhale 2 puffs into the lungs in the morning and at bedtime. 10.7 g 5   Coenzyme Q10 (COQ-10 PO) Take by mouth.     EYSUVIS 0.25 % SUSP Apply to eye.     folic acid (FOLVITE) 1 MG tablet Take 2 tablets (2 mg total) by mouth daily. 180 tablet 11   GARLIC PO Take by mouth.     lidocaine (XYLOCAINE) 5 % ointment Apply 1 Application topically 3 (three) times daily. Use as needed. 1.25 g 2   MAGNESIUM PO Take by mouth.     metFORMIN (GLUCOPHAGE-XR) 500 MG 24 hr tablet Take 2 tablets (1,000 mg total) by mouth daily before  supper. 180 tablet 3   methotrexate (RHEUMATREX) 2.5 MG tablet Take 8 tablets (20 mg total) by mouth once a week. 96 tablet 1   nystatin-triamcinolone ointment (MYCOLOG) Apply 1 Application topically 2 (two) times daily. Use for a one week as needed. 30 g 2   omeprazole (PRILOSEC) 20 MG capsule Take 1 capsule (20 mg total) by mouth daily. 90 capsule 5   ondansetron (ZOFRAN) 4 MG tablet Take 1 tablet (4 mg total) by mouth daily at 2 PM. 30 tablet 0   predniSONE (DELTASONE) 5 MG tablet Take 2 tablets (10 mg total) by mouth daily with breakfast. 60 tablet 2   tirzepatide (MOUNJARO) 7.5 MG/0.5ML Pen Inject 7.5 mg into the skin once a week. 6 mL 3   Vibegron 75 MG TABS Take 1 tablet (75 mg total) by mouth daily. 90 tablet 3   No facility-administered medications prior to visit.    Review of Systems  Constitutional:  Negative for chills, diaphoresis, fever, malaise/fatigue and weight loss.  HENT:  Negative for congestion.   Respiratory:  Positive for shortness of breath. Negative for cough, hemoptysis, sputum production and wheezing.   Cardiovascular:  Negative for chest pain, palpitations and leg swelling.     Objective:   There were no vitals filed for this visit.    on RA BMI Readings from Last 3 Encounters:  05/15/23 35.85 kg/m  05/03/23 34.85 kg/m  05/01/23 36.37 kg/m   Wt Readings from Last 3 Encounters:  05/15/23 194 lb 7.1 oz (88.2 kg)  05/03/23 189 lb (85.7 kg)  05/01/23 192 lb 7.4 oz (87.3 kg)   Physical Exam: General: Well-appearing, no acute distress HENT: [place], AT Eyes: EOMI, no scleral icterus Respiratory: Clear to auscultation bilaterally.  No crackles, wheezing or rales Cardiovascular: RRR, -M/R/G, no JVD Extremities:-Edema,-tenderness Neuro: AAO x4, CNII-XII grossly intact Psych: Normal mood, normal affect  Data Reviewed:  Labs:    Latest Ref Rng & Units 02/01/2023   10:22 AM 11/27/2022   11:18 AM 06/05/2022    9:12 AM  CBC  WBC 3.4 - 10.8  x10E3/uL 12.8   12.5    Hemoglobin 11.1 - 15.9 g/dL 51.8  84.1  66.0   Hematocrit 34.0 - 46.6 % 42.7  43.1  42.0   Platelets 150 - 450 x10E3/uL 373          Latest Ref Rng & Units 04/03/2023    9:58 AM 02/01/2023   10:22 AM 11/27/2022   11:18 AM  CMP  Glucose 70 - 99 mg/dL  97  630   BUN 6 - 24 mg/dL  9  11   Creatinine 1.60 - 1.00 mg/dL  1.09  3.23   Sodium 557 - 144 mmol/L  143  141   Potassium 3.5 - 5.2 mmol/L  5.4  5.0   Chloride 96 - 106 mmol/L  103  101   CO2 20 - 29 mmol/L  24  25   Calcium 8.7 - 10.2 mg/dL  32.2  02.5   Total Protein 6.1 - 8.1 g/dL 6.6  7.2  7.0   Total Bilirubin 0.0 - 1.2 mg/dL  0.3  0.5   Alkaline Phos 44 - 121 IU/L  90  82   AST 0 - 40 IU/L  24  24   ALT 0 - 32 IU/L  29  25   Stable electrolytes, kidney function and liver  Labs from 10/12/22 OSH provided by patient on phone: CBC with diff, CMET, TSH - overall within normal limits except for hyperglycemia  Chest Imaging:  05/24/2022 which reveals hypermetabolic adenopathy and splenic involvement consistent with sarcoid. CT Chest 09/13/22 - Stable 5 mm left upper lobe pulmonary nodule. Stable mediastinal and hilar lymph nodes. Overall normal parenchyma PET/CT 05/07/23 - Interval resolution of previous tracer avid lymphadenopathy within CAP and in liver and spleen.  Pulmonary Functions Testing Results:    Latest Ref Rng & Units 11/17/2022    8:44 AM  PFT Results  FVC-Pre L 2.41   FVC-Predicted Pre % 77   FVC-Post L 2.44   FVC-Predicted Post % 78   Pre FEV1/FVC % % 86   Post FEV1/FCV % % 87   FEV1-Pre L 2.07   FEV1-Predicted Pre % 85   FEV1-Post L 2.11   DLCO uncorrected ml/min/mmHg 19.26   DLCO UNC% % 102   DLCO corrected ml/min/mmHg 19.26   DLCO COR %Predicted % 102   DLVA Predicted % 118   TLC L 4.15   TLC % Predicted % 88   RV % Predicted % 96   11/17/22 - Interpretation: Normal PFTs. 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 Correlation with clinical and radiological findings is recommended.   Echocardiogram:  11/16/22 - Normal EF.      Assessment & Plan:   No diagnosis found. Discussion: Patient has had evidence of sarcoid since 05/24/2022 with extensive hypermetabolic lymphadenopathy involving the chest, abdomen and pelvis and hepatic and diffuse splenic hypermetabolic disease. Her symptoms worsened to the point of needing immunosuppression starting in Feb 2024 initiated by her Rheumatologist. Sarcoids remains uncontrolled on methotrexate 20 mg. Trialed acthar from 02/2023 to 03/2023 but discontinued due to headaches. PET/CT reviewed and improved hypermetabolic activity  Sarcoid with deconditioning High risk medication management --Continue methotrexate 20 mg weekly. Plan for 18-24 month course with plan to start weaning off 04/2024. --Taper prednisone 5 mg x 1 month. Then prednisone 2.5 mg x 1 month. --If joint persists despite over the counter ibuprofen and tylenol, please contact your  rheumatologist (Dr. Dimple Casey) for appointment --Take omeprazole 20 mg daily  --Labs ordered below  Shortness of breath --PFTs and echo normal --CONTINUE Breztri ONE puff TWICE a day. Refilled  History of OSA Fatigue, excessive daytime sleepiness --ONO 2024 normal  Brain fog --Scheduled with [facility] Neuro[facility]arcoid clinic [facility] Champ Mungo, MD @ [facility]) in January 2025  Current Outpatient Medications:    aspirin EC 81 MG tablet, Take 81 mg by mouth daily. Swallow whole., Disp: , Rfl:    Budeson-Glycopyrrol-Formoterol (BREZTRI AEROSPHERE) 160-9-4.8 MCG/ACT AERO, Inhale 2 puffs into the lungs in the morning and at bedtime., Disp: 10.7 g, Rfl: 5   Coenzyme Q10 (COQ-10 PO), Take by mouth., Disp: , Rfl:    EYSUVIS 0.25 % SUSP, Apply to eye., Disp: , Rfl:    folic acid (FOLVITE) 1 MG tablet, Take 2 tablets (2 mg total) by mouth daily., Disp: 180 tablet, Rfl: 11   GARLIC PO, Take by  mouth., Disp: , Rfl:    lidocaine (XYLOCAINE) 5 % ointment, Apply 1 Application topically 3 (three) times daily. Use as needed., Disp: 1.25 g, Rfl: 2   MAGNESIUM PO, Take by mouth., Disp: , Rfl:    metFORMIN (GLUCOPHAGE-XR) 500 MG 24 hr tablet, Take 2 tablets (1,000 mg total) by mouth daily before supper., Disp: 180 tablet, Rfl: 3   methotrexate (RHEUMATREX) 2.5 MG tablet, Take 8 tablets (20 mg total) by mouth once a week., Disp: 96 tablet, Rfl: 1   nystatin-triamcinolone ointment (MYCOLOG), Apply 1 Application topically 2 (two) times daily. Use for a one week as needed., Disp: 30 g, Rfl: 2   omeprazole (PRILOSEC) 20 MG capsule, Take 1 capsule (20 mg total) by mouth daily., Disp: 90 capsule, Rfl: 5   ondansetron (ZOFRAN) 4 MG tablet, Take 1 tablet (4 mg total) by mouth daily at 2 PM., Disp: 30 tablet, Rfl: 0   predniSONE (DELTASONE) 5 MG tablet, Take 2 tablets (10 mg total) by mouth daily with breakfast., Disp: 60 tablet, Rfl: 2   tirzepatide (MOUNJARO) 7.5 MG/0.5ML Pen, Inject 7.5 mg into the skin once a week., Disp: 6 mL, Rfl: 3   Vibegron 75 MG TABS, Take 1 tablet (75 mg total) by mouth daily., Disp: 90 tablet, Rfl: 3  I have spent a total time of 45-minutes on the day of the appointment including chart review, data review, collecting history, coordinating care and discussing medical diagnosis and plan with the patient/family. Past medical history, allergies, medications were reviewed. Pertinent imaging, labs and tests included in this note have been reviewed and interpreted independently by me.  Jaquan Sadowsky Mechele Collin, MD [facility] 05/21/2023 8:09 AM

## 2023-05-21 NOTE — Patient Instructions (Addendum)
Sarcoid with deconditioning High risk medication management --Continue methotrexate 20 mg weekly. Plan for 18-24 month course with plan to start weaning off 04/2024. --Taper prednisone 5 mg x 1 month. Then prednisone 2.5 mg x 1 month. --If joint persists despite over the counter ibuprofen and tylenol, please contact your rheumatologist (Dr. Dimple Casey) for appointment --Take omeprazole 20 mg daily   Shortness of breath --PFTs and echo normal --CONTINUE Breztri ONE puff TWICE a day. Refilled  History of OSA Fatigue, excessive daytime sleepiness --ONO 2024 normal  Brain fog --Scheduled with [facility]sarcoid clinic [facility] Champ Mungo, MD @ [facility]) in January 2025

## 2023-05-22 ENCOUNTER — Encounter (HOSPITAL_COMMUNITY)
Admission: RE | Admit: 2023-05-22 | Discharge: 2023-05-22 | Disposition: A | Discharge status: Home / Self Care | Payer: BC Managed Care – PPO | Source: Ambulatory Visit | Attending: Pulmonary Disease | Admitting: Pulmonary Disease

## 2023-05-22 DIAGNOSIS — D869 Sarcoidosis, unspecified: Secondary | ICD-10-CM | POA: Diagnosis present

## 2023-05-22 LAB — CBC WITH DIFFERENTIAL/PLATELET
Basophils Absolute: 0.1 10*3/uL (ref 0.0–0.2)
Basos: 1 %
EOS (ABSOLUTE): 0.1 10*3/uL (ref 0.0–0.4)
Eos: 1 %
Hematocrit: 42.8 % (ref 34.0–46.6)
Hemoglobin: 14 g/dL (ref 11.1–15.9)
Immature Grans (Abs): 0.1 10*3/uL (ref 0.0–0.1)
Immature Granulocytes: 1 %
Lymphocytes Absolute: 1.1 10*3/uL (ref 0.7–3.1)
Lymphs: 8 %
MCH: 31.5 pg (ref 26.6–33.0)
MCHC: 32.7 g/dL (ref 31.5–35.7)
MCV: 96 fL (ref 79–97)
Monocytes Absolute: 0.7 10*3/uL (ref 0.1–0.9)
Monocytes: 5 %
Neutrophils Absolute: 11.8 10*3/uL — ABNORMAL HIGH (ref 1.4–7.0)
Neutrophils: 84 %
Platelets: 425 10*3/uL (ref 150–450)
RBC: 4.45 x10E6/uL (ref 3.77–5.28)
RDW: 14.2 % (ref 11.7–15.4)
WBC: 13.9 10*3/uL — ABNORMAL HIGH (ref 3.4–10.8)

## 2023-05-22 LAB — COMPREHENSIVE METABOLIC PANEL
ALT: 22 [IU]/L (ref 0–32)
AST: 24 [IU]/L (ref 0–40)
Albumin: 4.3 g/dL (ref 3.8–4.9)
Alkaline Phosphatase: 90 [IU]/L (ref 44–121)
BUN/Creatinine Ratio: 13 (ref 9–23)
BUN: 12 mg/dL (ref 6–24)
Bilirubin Total: 0.3 mg/dL (ref 0.0–1.2)
CO2: 22 mmol/L (ref 20–29)
Calcium: 9.4 mg/dL (ref 8.7–10.2)
Chloride: 100 mmol/L (ref 96–106)
Creatinine, Ser: 0.92 mg/dL (ref 0.57–1.00)
Globulin, Total: 2.4 g/dL (ref 1.5–4.5)
Glucose: 158 mg/dL — ABNORMAL HIGH (ref 70–99)
Potassium: 4.5 mmol/L (ref 3.5–5.2)
Sodium: 140 mmol/L (ref 134–144)
Total Protein: 6.7 g/dL (ref 6.0–8.5)
eGFR: 73 mL/min/{1.73_m2} (ref >59)

## 2023-05-22 NOTE — Progress Notes (Unsigned)
    Aleen Sells D.[facility] [address] [place] [address] Phone: [phone]   Assessment and Plan:     There are no diagnoses linked to this encounter.  ***   Pertinent previous records reviewed include ***    Follow Up: ***     Subjective:   I, Byford Schools, am serving as a Neurosurgeon for Doctor Richardean Sale  Chief Complaint: back and neck pain   HPI:   05/23/2023 Patient 57 year old female with back and neck pain. Patient states   Relevant Historical Information: ***  Additional pertinent review of systems negative.   Current Outpatient Medications:    aspirin EC 81 MG tablet, Take 81 mg by mouth daily. Swallow whole., Disp: , Rfl:    BREZTRI AEROSPHERE 160-9-4.8 MCG/ACT AERO, Inhale 2 puffs into the lungs in the morning and at bedtime., Disp: 32.1 g, Rfl: 3   Coenzyme Q10 (COQ-10 PO), Take by mouth., Disp: , Rfl:    folic acid (FOLVITE) 1 MG tablet, Take 2 tablets (2 mg total) by mouth daily., Disp: 180 tablet, Rfl: 11   GARLIC PO, Take by mouth., Disp: , Rfl:    lidocaine (XYLOCAINE) 5 % ointment, Apply 1 Application topically 3 (three) times daily. Use as needed., Disp: 1.25 g, Rfl: 2   MAGNESIUM PO, Take by mouth., Disp: , Rfl:    metFORMIN (GLUCOPHAGE-XR) 500 MG 24 hr tablet, Take 2 tablets (1,000 mg total) by mouth daily before supper., Disp: 180 tablet, Rfl: 3   methotrexate (RHEUMATREX) 2.5 MG tablet, Take 8 tablets (20 mg total) by mouth once a week., Disp: 96 tablet, Rfl: 2   nystatin-triamcinolone ointment (MYCOLOG), Apply 1 Application topically 2 (two) times daily. Use for a one week as needed., Disp: 30 g, Rfl: 2   omeprazole (PRILOSEC) 20 MG capsule, Take 1 capsule (20 mg total) by mouth daily., Disp: 90 capsule, Rfl: 5   ondansetron (ZOFRAN) 4 MG tablet, Take 1 tablet (4 mg total) by mouth daily at 2 PM., Disp: 30 tablet, Rfl: 0   predniSONE (DELTASONE) 5 MG tablet, Take 2 tablets (10 mg total) by mouth  daily with breakfast., Disp: 60 tablet, Rfl: 2   tirzepatide (MOUNJARO) 7.5 MG/0.5ML Pen, Inject 7.5 mg into the skin once a week., Disp: 6 mL, Rfl: 3   Vibegron 75 MG TABS, Take 1 tablet (75 mg total) by mouth daily., Disp: 90 tablet, Rfl: 3   Objective:     There were no vitals filed for this visit.    There is no height or weight on file to calculate BMI.    Physical Exam:    ***   Electronically signed by:  Aleen Sells D.[facility] 4:34 PM 05/22/23

## 2023-05-22 NOTE — Progress Notes (Signed)
Daily Session Note  Patient Details  Name: [name] MRN: [id number] Date of Birth: 1965/11/24 Referring Provider:   Doristine Devoid Pulmonary Rehab Walk Test from 01/12/2023 in [facility]  Referring Provider [name]       Encounter Date: 05/22/2023  Check In:  Session Check In - 05/22/23 0815       Check-In   Supervising physician immediately available to respond to emergencies [facility] MD immediately available    Physician(s) [name], NP    Location [facility]    Staff Present Raford Pitcher, MS, ACSM-CEP, Exercise Physiologist;Randi Dionisio Paschal, ACSM-CEP, Exercise Physiologist    Virtual Visit No    Medication changes reported     No    Tobacco Cessation No Change    Warm-up and Cool-down Performed as group-led instruction    Resistance Training Performed Yes    VAD Patient? No    PAD/SET Patient? No      Pain Assessment   Currently in Pain? No/denies             Capillary Blood Glucose: Results for orders placed or performed in visit on 05/21/23 (from the past 24 hour(s))  CBC with Differential     Status: Abnormal   Collection Time: 05/21/23  9:36 AM  Result Value Ref Range   WBC 13.9 (H) 3.4 - 10.8 x10E3/uL   RBC 4.45 3.77 - 5.28 x10E6/uL   Hemoglobin 14.0 11.1 - 15.9 g/dL   Hematocrit 40.9 81.1 - 46.6 %   MCV 96 79 - 97 fL   MCH 31.5 26.6 - 33.0 pg   MCHC 32.7 31.5 - 35.7 g/dL   RDW 91.4 78.2 - 95.6 %   Platelets 425 150 - 450 x10E3/uL   Neutrophils 84 Not Estab. %   Lymphs 8 Not Estab. %   Monocytes 5 Not Estab. %   Eos 1 Not Estab. %   Basos 1 Not Estab. %   Neutrophils Absolute 11.8 (H) 1.4 - 7.0 x10E3/uL   Lymphocytes Absolute 1.1 0.7 - 3.1 x10E3/uL   Monocytes Absolute 0.7 0.1 - 0.9 x10E3/uL   EOS (ABSOLUTE) 0.1 0.0 - 0.4 x10E3/uL   Basophils Absolute 0.1 0.0 - 0.2 x10E3/uL   Immature Granulocytes 1 Not Estab. %   Immature Grans (Abs) 0.1 0.0 - 0.1 x10E3/uL    Narrative   Performed at:  [address] [employer] [address] [place], [place]  [id number] Lab Director: Jolene Schimke MD, Phone:  [phone]  Comp Met (CMET)     Status: Abnormal   Collection Time: 05/21/23  9:36 AM  Result Value Ref Range   Glucose 158 (H) 70 - 99 mg/dL   BUN 12 6 - 24 mg/dL   Creatinine, Ser 1.32 0.57 - 1.00 mg/dL   eGFR 73 >44 WN/UUV/2.53   BUN/Creatinine Ratio 13 9 - 23   Sodium 140 134 - 144 mmol/L   Potassium 4.5 3.5 - 5.2 mmol/L   Chloride 100 96 - 106 mmol/L   CO2 22 20 - 29 mmol/L   Calcium 9.4 8.7 - 10.2 mg/dL   Total Protein 6.7 6.0 - 8.5 g/dL   Albumin 4.3 3.8 - 4.9 g/dL   Globulin, Total 2.4 1.5 - 4.5 g/dL   Bilirubin Total 0.3 0.0 - 1.2 mg/dL   Alkaline Phosphatase 90 44 - 121 IU/L   AST 24 0 - 40 IU/L   ALT 22 0 - 32 IU/L  Narrative   Performed at:  [address] [facility] [address] [place], [place]  [id number] Lab Director: Jolene Schimke MD, Phone:  [phone]      Social History   Tobacco Use  Smoking Status Never   Passive exposure: Never  Smokeless Tobacco Never    Goals Met:  Proper associated with RPD/PD & O2 Sat Exercise tolerated well No report of concerns or symptoms today  Goals Unmet:  Not Applicable  Comments: Service time is from 0803 to 0836.    Dr. Mechele Collin is Medical Director for Pulmonary Rehab at [facility].

## 2023-05-23 ENCOUNTER — Ambulatory Visit (INDEPENDENT_AMBULATORY_CARE_PROVIDER_SITE_OTHER): Payer: BC Managed Care – PPO | Admitting: Sports Medicine

## 2023-05-23 VITALS — HR 92 | Ht 61.0 in | Wt 193.0 lb

## 2023-05-23 DIAGNOSIS — M5441 Lumbago with sciatica, right side: Secondary | ICD-10-CM

## 2023-05-23 DIAGNOSIS — G8929 Other chronic pain: Secondary | ICD-10-CM

## 2023-05-23 DIAGNOSIS — M542 Cervicalgia: Secondary | ICD-10-CM | POA: Diagnosis not present

## 2023-05-23 DIAGNOSIS — M5442 Lumbago with sciatica, left side: Secondary | ICD-10-CM

## 2023-05-23 DIAGNOSIS — R200 Anesthesia of skin: Secondary | ICD-10-CM

## 2023-05-23 DIAGNOSIS — R202 Paresthesia of skin: Secondary | ICD-10-CM

## 2023-05-23 MED ORDER — MELOXICAM 15 MG PO TABS: 15.0000 mg | ORAL_TABLET | Freq: Every day | ORAL | 0 refills | Status: DC

## 2023-05-23 NOTE — Patient Instructions (Signed)
Neck and low back HEP  - Start meloxicam 15 mg daily x2 weeks.  If still having pain after 2 weeks, complete 3rd-week of NSAID. May use remaining NSAID as needed once daily for pain control.  Do not to use additional over-the-counter NSAIDs (ibuprofen, naproxen, Advil, Aleve) while taking prescription NSAIDs.  May use Tylenol [phone] mg 2 to 3 times a day for breakthrough pain. Recommend discussing your PET scan and dexa scan with rheumatology 4 week follow up

## 2023-05-23 NOTE — Progress Notes (Signed)
Discharge Progress Report  Patient Details  Name: [name] MRN: [id number] Date of Birth: 09-11-1965 Referring Provider:   Doristine Devoid Pulmonary Rehab Walk Test from 01/12/2023 in [facility]  Referring Provider [name]        Number of Visits: 22  Reason for Discharge:  Patient reached a stable level of exercise. Patient independent in their exercise. Patient has met program and personal goals.  Smoking History:  Social History   Tobacco Use  Smoking Status Never   Passive exposure: Never  Smokeless Tobacco Never    Diagnosis:  Sarcoidosis  ADL [facility]:  Pulmonary Assessment Scores     Row Name 01/12/23 0956 05/15/23 0820 05/23/23 0931     ADL [facility]SD   ADL Phase Entry Exit --   SOB Score total 19 20 --     CAT Score   CAT Score 11 7 --     mMRC Score   mMRC Score 2 -- 2            Initial Exercise Prescription:  Initial Exercise Prescription - 01/12/23 1000       Date of Initial Exercise RX and Referring Provider   Date 01/12/23    Referring Provider [name]    Expected Discharge Date 04/12/23      Treadmill   MPH 2    Grade 0    Minutes 15      Elliptical   Level 1    Speed 1    Minutes 15      Prescription Details   Frequency (times per week) 2    Duration Progress to 30 minutes of continuous aerobic without signs/symptoms of physical distress      Intensity   THRR 40-80% of Max Heartrate 66-131    Ratings of Perceived Exertion 11-13    Perceived Dyspnea 0-4      Progression   Progression Continue to progress workloads to maintain intensity without signs/symptoms of physical distress.      Resistance Training   Training Prescription Yes    Weight black bands    Reps 10-15             Discharge Exercise Prescription (Final Exercise Prescription Changes):  Exercise Prescription Changes - 05/15/23 0900       Response to Exercise   Blood Pressure (Admit)  116/70    Blood Pressure (Exercise) 130/80    Blood Pressure (Exit) 108/78    Heart Rate (Admit) 96 bpm    Heart Rate (Exercise) 118 bpm    Heart Rate (Exit) 102 bpm    Oxygen Saturation (Admit) 98 %    Oxygen Saturation (Exercise) 96 %    Oxygen Saturation (Exit) 97 %    Rating of Perceived Exertion (Exercise) 12    Perceived Dyspnea (Exercise) 1    Duration Continue with 30 min of aerobic exercise without signs/symptoms of physical distress.    Intensity THRR unchanged      Progression   Progression Continue to progress workloads to maintain intensity without signs/symptoms of physical distress.      Resistance Training   Training Prescription Yes    Weight black bands    Reps 10-15    Time 10 Minutes      Treadmill   MPH 2.7    Grade 2    Minutes 15    METs 3.7      Elliptical   Level 3    Speed  2    Minutes 15    METs 4.2             Functional Capacity:  6 Minute Walk     Row Name 01/12/23 0958 05/22/23 0849       6 Minute Walk   Phase Initial Discharge    Distance 1090 feet 1624 feet    Distance % Change -- 48.99 %    Distance Feet Change -- 534 ft    Walk Time 6 minutes 6 minutes    # of Rest Breaks 0 0    MPH 2.06 3.08    METS 2.82 3.84    RPE 10 11    Perceived Dyspnea  1 1    VO2 Peak 9.88 13.45    Symptoms No No    Resting HR 89 bpm 92 bpm    Resting BP 122/88 120/80    Resting Oxygen Saturation  98 % 97 %    Exercise Oxygen Saturation  during 6 min walk 95 % 96 %    Max Ex. HR 108 bpm 119 bpm    Max Ex. BP 126/72 124/80    2 Minute Post BP 114/70 114/78      Interval HR   1 Minute HR 104 104    2 Minute HR 106 111    3 Minute HR 104 114    4 Minute HR 106 113    5 Minute HR 102 110    6 Minute HR 108 119    2 Minute Post HR 92 93    Interval Heart Rate? Yes Yes      Interval Oxygen   Interval Oxygen? Yes Yes    Baseline Oxygen Saturation % 98 % 97 %    1 Minute Oxygen Saturation % 95 % 97 %    1 Minute Liters of Oxygen 0 L  0 L    2 Minute Oxygen Saturation % 97 % 97 %    2 Minute Liters of Oxygen 0 L 0 L    3 Minute Oxygen Saturation % 98 % 96 %    3 Minute Liters of Oxygen 0 L 0 L    4 Minute Oxygen Saturation % 97 % 96 %    4 Minute Liters of Oxygen 0 L 0 L    5 Minute Oxygen Saturation % 99 % 97 %    5 Minute Liters of Oxygen 0 L 0 L    6 Minute Oxygen Saturation % 100 % 97 %    6 Minute Liters of Oxygen 0 L 0 L    2 Minute Post Oxygen Saturation % 100 % 98 %    2 Minute Post Liters of Oxygen 0 L 0 L             Psychological, QOL, Others - Outcomes: PHQ 2/9:    05/15/2023    8:21 AM 01/12/2023   11:22 AM 12/15/2019    9:23 AM 11/19/2018    1:17 PM  Depression screen PHQ 2/9  Decreased Interest 0 0 3 0  Down, Depressed, Hopeless 0 0 2 0  PHQ - 2 Score 0 0 5 0  Altered sleeping 0 0 3 1  Tired, decreased energy 0 0 3 1  Change in appetite 0 0 3 0  Feeling bad or failure about yourself  0 0 0 0  Trouble concentrating 0 0 0 0  Moving slowly or fidgety/restless 0 0 1 0  Suicidal thoughts 0  0 0 0  PHQ-9 Score 0 0 15 2  Difficult doing work/chores Not difficult at all  Not difficult at all     Quality of Life:   Personal Goals: Goals established at orientation with interventions provided to work toward goal.  Personal Goals and Risk Factors at Admission - 01/12/23 0919       Core Components/Risk Factors/Patient Goals on Admission    Weight Management Weight Loss;Yes    Intervention Weight Management: Develop a combined nutrition and exercise program designed to reach desired caloric intake, while maintaining appropriate intake of nutrient and fiber, sodium and fats, and appropriate energy expenditure required for the weight goal.;Weight Management: Provide education and appropriate resources to help participant work on and attain dietary goals.;Weight Management/Obesity: Establish reasonable short term and long term weight goals.;Obesity: Provide education and appropriate resources to help  participant work on and attain dietary goals.    Admit Weight 192 lb 9.6 oz (87.4 kg)    Improve shortness of breath with ADL's Yes    Intervention Provide education, individualized exercise plan and daily activity instruction to help decrease symptoms of SOB with activities of daily living.    Expected Outcomes Short Term: Improve cardiorespiratory fitness to achieve a reduction of symptoms when performing ADLs;Long Term: Be able to perform more ADLs without symptoms or delay the onset of symptoms    Increase knowledge of respiratory medications and ability to use respiratory devices properly  Yes    Intervention Provide education and demonstration as needed of appropriate use of medications, inhalers, and oxygen therapy.    Expected Outcomes Short Term: Achieves understanding of medications use. Understands that oxygen is a medication prescribed by physician. Demonstrates appropriate use of inhaler and oxygen therapy.;Long Term: Maintain appropriate use of medications, inhalers, and oxygen therapy.              Personal Goals Discharge:  Goals and Risk Factor Review     Row Name 01/22/23 1551 02/12/23 1335 03/12/23 0928 04/12/23 1537 05/09/23 0851     Core Components/Risk Factors/Patient Goals Review   Personal Goals Review Improve shortness of breath with ADL's;Develop more efficient breathing techniques such as purse lipped breathing and diaphragmatic breathing and practicing self-pacing with activity.;Increase knowledge of respiratory medications and ability to use respiratory devices properly.;Weight Management/Obesity Weight Management/Obesity;Improve shortness of breath with ADL's;Develop more efficient breathing techniques such as purse lipped breathing and diaphragmatic breathing and practicing self-pacing with activity.;Increase knowledge of respiratory medications and ability to use respiratory devices properly. Weight Management/Obesity;Improve shortness of breath with ADL's;Develop  more efficient breathing techniques such as purse lipped breathing and diaphragmatic breathing and practicing self-pacing with activity.;Increase knowledge of respiratory medications and ability to use respiratory devices properly. Weight Management/Obesity;Improve shortness of breath with ADL's --   Review Unable to assess, Dinamarie has not yet started the program but is scheduled to start on 01/30/2023 Shyne has attended one session so far. She is scheduled to return to class on 9/10. Goals are in progress. Goal in progress for weight loss. Jylian has not had success yet in losing weight but is still working with our dietician. She hopes that exercising will help. She has just started her journey in class and has completed 5 sessions so far. Goal met on developing more efficient breathing techniques such as purse lipped breathing and diaphragmatic breathing; and practicing self-pacing with activity. Ahyanna can initiate PLB and can self-pace herself while on the elliptical and treadmill. Goal in progress on improving her shortness of  breath with ADLs. She has increased both her workload and METs while maintaining her oxygen saturation >88% on room air. Goal met for increasing her knowledge of respiratory medications and ability to use respiratory devices properly. She has correctly demonstrated and voiced when to use her medications with our respiratory therapist. Khaylee will continue to benefit from PR for nutrition, education, exercise, and lifestyle modification. Goal in progress for weight loss. Joelie has not had success yet in losing weight but is still working with our dietician. Goal in progress on improving her shortness of breath with ADLs. She has increased both her workload and METs while maintaining her oxygen saturation >88% on room air. Shama will continue to benefit from PR for nutrition, education, exercise, and lifestyle modification. Goal in progress for weight loss. Shynice has not had success  yet in losing weight but is still working with our dietitian to meet her goals. Goal in progress on improving her shortness of breath with ADLs. She has increased both her workload and METs while maintaining her oxygen saturation >88% on room air. Summah will continue to benefit from PR for nutrition, education, exercise, and lifestyle modification.   Expected Outcomes See admission goals See admission goals For Joleta to lose weight and improve her SOB with ADLs For Annalin to lose weight and improve her SOB with ADLs For Lasha to lose weight and improve her SOB with ADLs    Row Name 05/23/23 8413             Core Components/Risk Factors/Patient Goals Review   Personal Goals Review Weight Management/Obesity;Improve shortness of breath with ADL's       Review Discharge core components/risk factors/patient goals at time of discharge are as follows: Goal not met weight loss. Lylah stayed at the same weight as when she started the program. Due to stress from her husband's health, she stated she has been snacking more often. She has spoken to our dietitian throughout her time in the program and has the tools necessary when she does decide to commit. Goal met on improving her shortness of breath with ADLs. Payden increased both her workload and METs while maintaining her oxygen saturation >88% on room air. She was happy with the progress she made during the program. We are proud of her accomplishments throughout Pulmonary Rehab!       Expected Outcomes For Lasundra to lose weight post pulmonary rehab                Exercise Goals and Review:  Exercise Goals     Row Name 01/12/23 2440 01/18/23 0919 01/19/23 1541 02/12/23 1217       Exercise Goals   Increase Physical Activity Yes Yes Yes Yes    Intervention Provide advice, education, support and counseling about physical activity/exercise needs.;Develop an individualized exercise prescription for aerobic and resistive training based on initial  evaluation findings, risk stratification, comorbidities and participant's personal goals. Provide advice, education, support and counseling about physical activity/exercise needs.;Develop an individualized exercise prescription for aerobic and resistive training based on initial evaluation findings, risk stratification, comorbidities and participant's personal goals. Provide advice, education, support and counseling about physical activity/exercise needs.;Develop an individualized exercise prescription for aerobic and resistive training based on initial evaluation findings, risk stratification, comorbidities and participant's personal goals. Provide advice, education, support and counseling about physical activity/exercise needs.;Develop an individualized exercise prescription for aerobic and resistive training based on initial evaluation findings, risk stratification, comorbidities and participant's personal goals.    Expected Outcomes  Short Term: Attend rehab on a regular basis to increase amount of physical activity.;Long Term: Exercising regularly at least 3-5 days a week.;Long Term: Add in home exercise to make exercise part of routine and to increase amount of physical activity. Short Term: Attend rehab on a regular basis to increase amount of physical activity.;Long Term: Exercising regularly at least 3-5 days a week.;Long Term: Add in home exercise to make exercise part of routine and to increase amount of physical activity. Short Term: Attend rehab on a regular basis to increase amount of physical activity.;Long Term: Exercising regularly at least 3-5 days a week.;Long Term: Add in home exercise to make exercise part of routine and to increase amount of physical activity. Short Term: Attend rehab on a regular basis to increase amount of physical activity.;Long Term: Exercising regularly at least 3-5 days a week.;Long Term: Add in home exercise to make exercise part of routine and to increase amount of  physical activity.    Increase Strength and Stamina Yes Yes Yes Yes    Intervention Provide advice, education, support and counseling about physical activity/exercise needs.;Develop an individualized exercise prescription for aerobic and resistive training based on initial evaluation findings, risk stratification, comorbidities and participant's personal goals. Provide advice, education, support and counseling about physical activity/exercise needs.;Develop an individualized exercise prescription for aerobic and resistive training based on initial evaluation findings, risk stratification, comorbidities and participant's personal goals. Provide advice, education, support and counseling about physical activity/exercise needs.;Develop an individualized exercise prescription for aerobic and resistive training based on initial evaluation findings, risk stratification, comorbidities and participant's personal goals. Provide advice, education, support and counseling about physical activity/exercise needs.;Develop an individualized exercise prescription for aerobic and resistive training based on initial evaluation findings, risk stratification, comorbidities and participant's personal goals.    Expected Outcomes Short Term: Increase workloads from initial exercise prescription for resistance, speed, and METs.;Short Term: Perform resistance training exercises routinely during rehab and add in resistance training at home;Long Term: Improve cardiorespiratory fitness, muscular endurance and strength as measured by increased METs and functional capacity ( ) Short Term: Increase workloads from initial exercise prescription for resistance, speed, and METs.;Short Term: Perform resistance training exercises routinely during rehab and add in resistance training at home;Long Term: Improve cardiorespiratory fitness, muscular endurance and strength as measured by increased METs and functional capacity ( ) Short Term: Increase  workloads from initial exercise prescription for resistance, speed, and METs.;Short Term: Perform resistance training exercises routinely during rehab and add in resistance training at home;Long Term: Improve cardiorespiratory fitness, muscular endurance and strength as measured by increased METs and functional capacity ( ) Short Term: Increase workloads from initial exercise prescription for resistance, speed, and METs.;Short Term: Perform resistance training exercises routinely during rehab and add in resistance training at home;Long Term: Improve cardiorespiratory fitness, muscular endurance and strength as measured by increased METs and functional capacity ( )    Able to understand and use rate of perceived exertion (RPE) scale Yes Yes Yes Yes    Intervention Provide education and explanation on how to use RPE scale Provide education and explanation on how to use RPE scale Provide education and explanation on how to use RPE scale Provide education and explanation on how to use RPE scale    Expected Outcomes Short Term: Able to use RPE daily in rehab to express subjective intensity level;Long Term:  Able to use RPE to guide intensity level when exercising independently Short Term: Able to use RPE daily in rehab to express subjective intensity level;Long Term:  Able to use RPE to guide intensity level when exercising independently Short Term: Able to use RPE daily in rehab to express subjective intensity level;Long Term:  Able to use RPE to guide intensity level when exercising independently Short Term: Able to use RPE daily in rehab to express subjective intensity level;Long Term:  Able to use RPE to guide intensity level when exercising independently    Able to understand and use Dyspnea scale Yes Yes Yes Yes    Intervention Provide education and explanation on how to use Dyspnea scale Provide education and explanation on how to use Dyspnea scale Provide education and explanation on how to use Dyspnea  scale Provide education and explanation on how to use Dyspnea scale    Expected Outcomes Short Term: Able to use Dyspnea scale daily in rehab to express subjective sense of shortness of breath during exertion;Long Term: Able to use Dyspnea scale to guide intensity level when exercising independently Short Term: Able to use Dyspnea scale daily in rehab to express subjective sense of shortness of breath during exertion;Long Term: Able to use Dyspnea scale to guide intensity level when exercising independently Short Term: Able to use Dyspnea scale daily in rehab to express subjective sense of shortness of breath during exertion;Long Term: Able to use Dyspnea scale to guide intensity level when exercising independently Short Term: Able to use Dyspnea scale daily in rehab to express subjective sense of shortness of breath during exertion;Long Term: Able to use Dyspnea scale to guide intensity level when exercising independently    Knowledge and understanding of Target Heart Rate Range (THRR) Yes Yes Yes Yes    Intervention Provide education and explanation of THRR including how the numbers were predicted and where they are located for reference Provide education and explanation of THRR including how the numbers were predicted and where they are located for reference Provide education and explanation of THRR including how the numbers were predicted and where they are located for reference Provide education and explanation of THRR including how the numbers were predicted and where they are located for reference    Expected Outcomes Short Term: Able to state/look up THRR;Short Term: Able to use daily as guideline for intensity in rehab;Long Term: Able to use THRR to govern intensity when exercising independently Short Term: Able to state/look up THRR;Short Term: Able to use daily as guideline for intensity in rehab;Long Term: Able to use THRR to govern intensity when exercising independently Short Term: Able to  state/look up THRR;Short Term: Able to use daily as guideline for intensity in rehab;Long Term: Able to use THRR to govern intensity when exercising independently Short Term: Able to state/look up THRR;Short Term: Able to use daily as guideline for intensity in rehab;Long Term: Able to use THRR to govern intensity when exercising independently    Understanding of Exercise Prescription Yes Yes Yes Yes    Intervention Provide education, explanation, and written materials on patient's individual exercise prescription Provide education, explanation, and written materials on patient's individual exercise prescription Provide education, explanation, and written materials on patient's individual exercise prescription Provide education, explanation, and written materials on patient's individual exercise prescription    Expected Outcomes Short Term: Able to explain program exercise prescription;Long Term: Able to explain home exercise prescription to exercise independently Short Term: Able to explain program exercise prescription;Long Term: Able to explain home exercise prescription to exercise independently Short Term: Able to explain program exercise prescription;Long Term: Able to explain home exercise prescription to exercise independently Short Term: Able  to explain program exercise prescription;Long Term: Able to explain home exercise prescription to exercise independently             Exercise Goals Re-Evaluation:  Exercise Goals Re-Evaluation     Row Name 01/18/23 0919 02/12/23 1217 03/13/23 1601 04/11/23 1025 05/09/23 1050     Exercise Goal Re-Evaluation   Exercise Goals Review Increase Physical Activity;Able to understand and use Dyspnea scale;Understanding of Exercise Prescription;Increase Strength and Stamina;Knowledge and understanding of Target Heart Rate Range (THRR);Able to understand and use rate of perceived exertion (RPE) scale Increase Physical Activity;Able to understand and use Dyspnea  scale;Understanding of Exercise Prescription;Increase Strength and Stamina;Knowledge and understanding of Target Heart Rate Range (THRR);Able to understand and use rate of perceived exertion (RPE) scale Increase Physical Activity;Able to understand and use Dyspnea scale;Understanding of Exercise Prescription;Increase Strength and Stamina;Knowledge and understanding of Target Heart Rate Range (THRR);Able to understand and use rate of perceived exertion (RPE) scale Increase Physical Activity;Able to understand and use Dyspnea scale;Understanding of Exercise Prescription;Increase Strength and Stamina;Knowledge and understanding of Target Heart Rate Range (THRR);Able to understand and use rate of perceived exertion (RPE) scale Increase Physical Activity;Able to understand and use Dyspnea scale;Understanding of Exercise Prescription;Increase Strength and Stamina;Knowledge and understanding of Target Heart Rate Range (THRR);Able to understand and use rate of perceived exertion (RPE) scale   Comments Pt is scheduled to begin exercise 8/13. Will progress as tolerated. Ahsoka has completed 1 exercise session. She exercises for 15 min on the upright elliptical and treadmill. Angi averages 4.3 METs at level 1 and speed 1 and 2.4 METs on the treadmill. She performs the warmup and cooldown standing without limitations. Prima has missed several sessions due to her surgery. She plans to return next month at a earlier class time that will benefit her. Will continue to monitor and progress as able. Kalya has completed 6 exercise session. She exercises for 15 min on the upright elliptical and treadmill. Priti averages 4.0 METs at level 2 and speed 1 and 3.26 METs on the treadmill. She performs the warmup and cooldown standing without limitations. She has increased her workload for the upright elliptical as METs have remained relatively the same. Her workload has increased and decreased on the treadmill. Alysiana does not feel a  difference since starting rehab. Will continue to monitor and progress as able. Bg has completed 12 exercise sessions. She exercises for 15 min on the upright elliptical and treadmill. Zaria averages 4.4 METs at level 2 and speed 3 and 3.3 METs at 2.7 mph and 1.5% incline on the treadmill. She performs the warmup and cooldown standing without limitations. She has increased her workload for both exercise modes as METs have increased. Marguerette tolerates progressions well. Will continue to monitor and progress as able. Luciel has completed 20 exercise sessions. She exercises for 15 min on the upright elliptical and treadmill. Jerrika averages 4.6 METs at level 2 and speed 3 and 3.3 METs at 2.7 mph and 2.0% incline on the treadmill. She performs the warmup and cooldown standing without limitations. She slightly increased her workload for the treadmill. She feel she is unable to increase on the upright elliptical at this time. Lashana still continues to exercise at the gym with her husband. Will continue to monitor and progress as able.   Expected Outcomes Through exercise at rehab and home, the patient will decrease shortness of breath with daily activities and feel confident in carrying out an exercise regimen at home Through exercise at rehab and home,  the patient will decrease shortness of breath with daily activities and feel confident in carrying out an exercise regimen at home Through exercise at rehab and home, the patient will decrease shortness of breath with daily activities and feel confident in carrying out an exercise regimen at home Through exercise at rehab and home, the patient will decrease shortness of breath with daily activities and feel confident in carrying out an exercise regimen at home Through exercise at rehab and home, the patient will decrease shortness of breath with daily activities and feel confident in carrying out an exercise regimen at home    Row Name 05/22/23 0845              Exercise Goal Re-Evaluation   Exercise Goals Review Increase Physical Activity;Able to understand and use Dyspnea scale;Understanding of Exercise Prescription;Increase Strength and Stamina;Knowledge and understanding of Target Heart Rate Range (THRR);Able to understand and use rate of perceived exertion (RPE) scale       Comments Quetcy has completed 22 exercise sessions. Her peak METs were 4.5 on the upright elliptical and 3.7 on the treadmill. I am confident in [place] completing an exercise regimen at home. She feels she has benefited from rehab.       Expected Outcomes Through exercise at rehab and home, the patient will decrease shortness of breath with daily activities and feel confident in carrying out an exercise regimen at home                Nutrition & Weight - Outcomes:  Pre Biometrics - 01/12/23 0909       Pre Biometrics   Grip Strength 20 kg              Nutrition:  Nutrition Therapy & Goals - 05/08/23 1127       Nutrition Therapy   Diet General Healthy Diet      Personal Nutrition Goals   Nutrition Goal Patient to improve diet quality by using the plate method as a guide for meal planning to include lean protein/plant protein, fruits, vegetables, whole grains, nonfat dairy as part of a well-balanced diet.   goal in progress.   Personal Goal #2 Patient to identify strategies for weight loss of 0.5-2.0# per week.   goal not met.   Comments Goals in progress. She did start Moujaro in April 2024 at 198#; she has maintained her weight since starting with our program. Chandelle has started to transition to more plant based eating to aid with inflammation and lose weight. She reports struggling with weight for most of adulthood. She does report improved fasting/am blood sugars <100. We have discussed multiple strategies for weight loss including the plate method as a guide for meal planning/portion sizes, protein supplements, benefits of high fiber/high protein intake. Her  A1c has improved to a pre-diabetic range (6.1); mounjaro was increased at 10/2 endocrinology appointment. Lipid panel remains elevated but improved overall. Answered patient questions regarding diet and acid reflux/heartburn; she has reduced many common food triggers such as coffee with relief/improvement of symptoms. Sienna will benefit from participation in pulmonary rehab for nutrition, exercise, and lifestyle modification.      Intervention Plan   Intervention Prescribe, educate and counsel regarding individualized specific dietary modifications aiming towards targeted core components such as weight, hypertension, lipid management, diabetes, heart failure and other comorbidities.;Nutrition handout(s) given to patient.    Expected Outcomes Short Term Goal: Understand basic principles of dietary content, such as calories, fat, sodium, cholesterol and nutrients.;Long Term Goal:  Adherence to prescribed nutrition plan.             Nutrition Discharge:  Nutrition Assessments - 05/15/23 0824       Rate Your Plate Scores   Pre Score 68             Education Questionnaire Score:  Knowledge Questionnaire Score - 05/15/23 0820       Knowledge Questionnaire Score   Post Score 18/18            Dois Davenport graduated Pulmonary Rehab on 05/22/23 completing 22 sessions. At the time of discharge, Raiya denied any psychosocial barriers or concerns.   Discharge core components/risk factors/patient goals at time of discharge are as follows: Goal not met weight loss. Akai stayed at the same weight as when she started the program. Due to stress from her husband's health, she stated she has been snacking more often. She has spoken to our dietitian throughout her time in the program and has the tools necessary when she does decide to commit. Goal met on improving her shortness of breath with ADLs. Leellen increased both her workload and METs while maintaining her oxygen saturation >88% on room air. She  was happy with the progress she made during the program. We are proud of her accomplishments throughout Pulmonary Rehab!   Goals reviewed with patient; copy given to patient.

## 2023-05-24 ENCOUNTER — Ambulatory Visit: Payer: BC Managed Care – PPO | Admitting: Nurse Practitioner

## 2023-05-27 ENCOUNTER — Encounter (HOSPITAL_BASED_OUTPATIENT_CLINIC_OR_DEPARTMENT_OTHER): Payer: Self-pay | Admitting: Pulmonary Disease

## 2023-05-30 ENCOUNTER — Encounter (HOSPITAL_BASED_OUTPATIENT_CLINIC_OR_DEPARTMENT_OTHER): Payer: Self-pay | Admitting: Family Medicine
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
## 2023-06-22 ENCOUNTER — Other Ambulatory Visit: Payer: Self-pay | Admitting: Sports Medicine

## 2023-06-25 ENCOUNTER — Ambulatory Visit: Payer: BC Managed Care – PPO | Admitting: Sports Medicine
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
## 2023-06-27 ENCOUNTER — Ambulatory Visit: Payer: BC Managed Care – PPO | Admitting: Sports Medicine

## 2023-06-27 VITALS — BP 130/82 | HR 102 | Ht 61.0 in | Wt 195.0 lb

## 2023-06-27 DIAGNOSIS — R202 Paresthesia of skin: Secondary | ICD-10-CM

## 2023-06-27 DIAGNOSIS — R200 Anesthesia of skin: Secondary | ICD-10-CM | POA: Diagnosis not present

## 2023-06-27 DIAGNOSIS — M9902 Segmental and somatic dysfunction of thoracic region: Secondary | ICD-10-CM

## 2023-06-27 DIAGNOSIS — M9908 Segmental and somatic dysfunction of rib cage: Secondary | ICD-10-CM

## 2023-06-27 DIAGNOSIS — M542 Cervicalgia: Secondary | ICD-10-CM

## 2023-06-27 DIAGNOSIS — M9901 Segmental and somatic dysfunction of cervical region: Secondary | ICD-10-CM

## 2023-06-27 DIAGNOSIS — M5442 Lumbago with sciatica, left side: Secondary | ICD-10-CM

## 2023-06-27 DIAGNOSIS — M5441 Lumbago with sciatica, right side: Secondary | ICD-10-CM

## 2023-06-27 DIAGNOSIS — M9903 Segmental and somatic dysfunction of lumbar region: Secondary | ICD-10-CM

## 2023-06-27 DIAGNOSIS — G8929 Other chronic pain: Secondary | ICD-10-CM

## 2023-06-27 DIAGNOSIS — M9905 Segmental and somatic dysfunction of pelvic region: Secondary | ICD-10-CM

## 2023-06-27 NOTE — Patient Instructions (Signed)
PT referral   Follow up 4-6 weeks

## 2023-07-08 ENCOUNTER — Encounter (HOSPITAL_BASED_OUTPATIENT_CLINIC_OR_DEPARTMENT_OTHER): Payer: Self-pay | Admitting: Family Medicine

## 2023-07-11 ENCOUNTER — Other Ambulatory Visit: Payer: Self-pay | Admitting: Student

## 2023-07-11 DIAGNOSIS — D869 Sarcoidosis, unspecified: Secondary | ICD-10-CM

## 2023-07-12 ENCOUNTER — Other Ambulatory Visit: Payer: Self-pay | Admitting: Student

## 2023-07-12 DIAGNOSIS — D869 Sarcoidosis, unspecified: Secondary | ICD-10-CM

## 2023-07-16 ENCOUNTER — Other Ambulatory Visit: Payer: BC Managed Care – PPO

## 2023-07-16 ENCOUNTER — Other Ambulatory Visit (HOSPITAL_BASED_OUTPATIENT_CLINIC_OR_DEPARTMENT_OTHER): Payer: Self-pay | Admitting: Family Medicine

## 2023-07-16 ENCOUNTER — Ambulatory Visit
Admission: RE | Admit: 2023-07-16 | Discharge: 2023-07-16 | Disposition: A | Discharge status: Home / Self Care | Payer: BC Managed Care – PPO | Source: Ambulatory Visit | Attending: Student | Admitting: Student

## 2023-07-16 DIAGNOSIS — E782 Mixed hyperlipidemia: Secondary | ICD-10-CM

## 2023-07-16 DIAGNOSIS — E119 Type 2 diabetes mellitus without complications: Secondary | ICD-10-CM

## 2023-07-16 DIAGNOSIS — D869 Sarcoidosis, unspecified: Secondary | ICD-10-CM

## 2023-07-16 MED ORDER — GADOPICLENOL 0.5 MMOL/ML IV SOLN
8.5000 mL | Freq: Once | INTRAVENOUS | Status: AC | PRN
Start: 2023-07-16 — End: 2023-07-16
  Administered 2023-07-16: 8.5 mL via INTRAVENOUS
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
## 2023-07-25 ENCOUNTER — Ambulatory Visit (INDEPENDENT_AMBULATORY_CARE_PROVIDER_SITE_OTHER): Payer: BC Managed Care – PPO | Admitting: Sports Medicine

## 2023-07-25 VITALS — BP 132/88 | HR 101 | Ht 61.0 in | Wt 192.6 lb

## 2023-07-25 DIAGNOSIS — M9908 Segmental and somatic dysfunction of rib cage: Secondary | ICD-10-CM | POA: Diagnosis not present

## 2023-07-25 DIAGNOSIS — M9901 Segmental and somatic dysfunction of cervical region: Secondary | ICD-10-CM | POA: Diagnosis not present

## 2023-07-25 DIAGNOSIS — M542 Cervicalgia: Secondary | ICD-10-CM | POA: Diagnosis not present

## 2023-07-25 DIAGNOSIS — M9902 Segmental and somatic dysfunction of thoracic region: Secondary | ICD-10-CM

## 2023-07-25 DIAGNOSIS — R200 Anesthesia of skin: Secondary | ICD-10-CM

## 2023-07-25 DIAGNOSIS — F411 Generalized anxiety disorder: Secondary | ICD-10-CM

## 2023-07-25 MED ORDER — LORAZEPAM 0.5 MG PO TABS: ORAL_TABLET | ORAL | 0 refills | Status: DC

## 2023-07-25 NOTE — Patient Instructions (Addendum)
 Epidural CSI Follow up two weeks after to discuss results.

## 2023-07-25 NOTE — Addendum Note (Signed)
 Addended by: Lynae Pederson on: 07/25/2023 11:40 AM   Modules accepted: Orders

## 2023-08-06 IMAGING — CT CT CHEST W/O CM
1 series · 15 of 34 positions shown, 19 images · non-contrast
Comparison: [DATE] [DATE], [DATE].  [DATE] [DATE], [DATE].

CLINICAL DATA: Shortness of breath, pulmonary nodule.

EXAM:
CT CHEST WITHOUT CONTRAST
TECHNIQUE: Multidetector CT imaging of the chest was performed following the
standard protocol without IV contrast.

[Series 2: chest w/(date) · axial · 0.81mm/px · z∈[-289,-35]mm · 15 of 149 slices shown, 19 images]
[im 11/149  mediastinal]
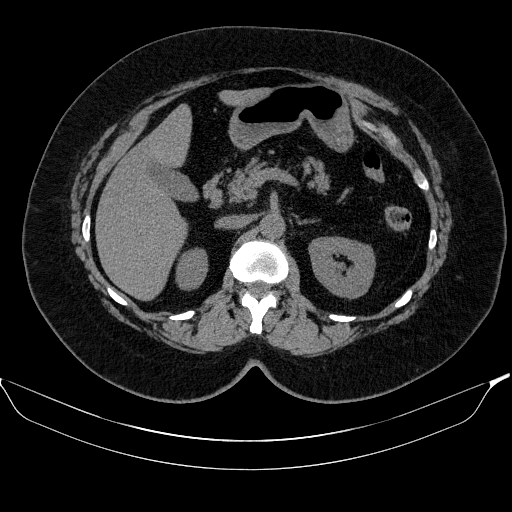
[im 11/149  lung]
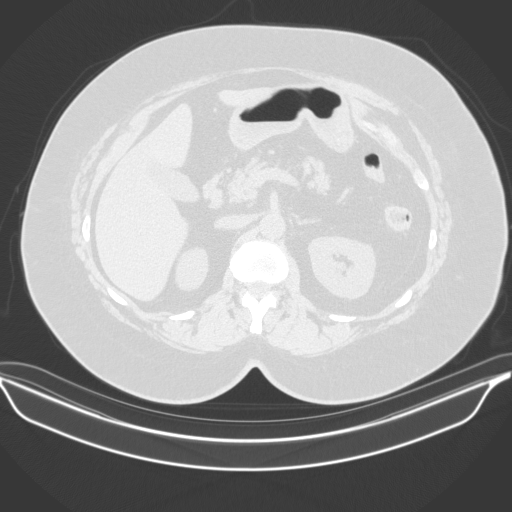
[im 22/149  lung]
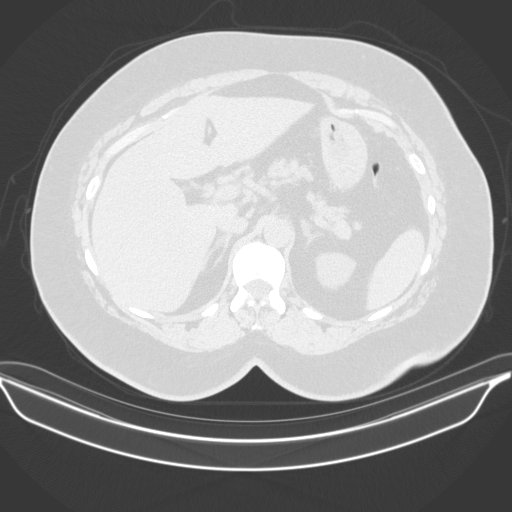
[im 30/149  lung]
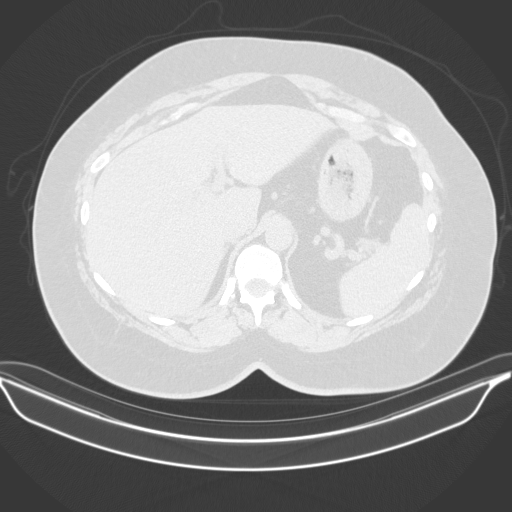
[im 39/149  lung]
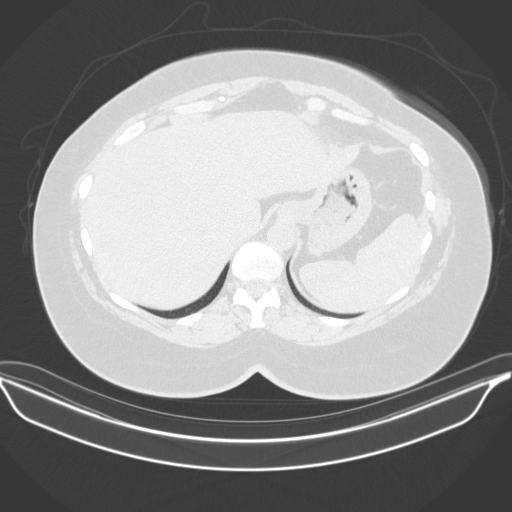
[im 50/149  mediastinal]
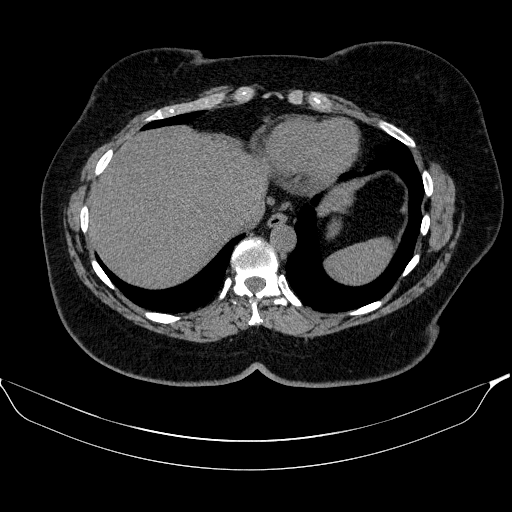
[im 50/149  lung]
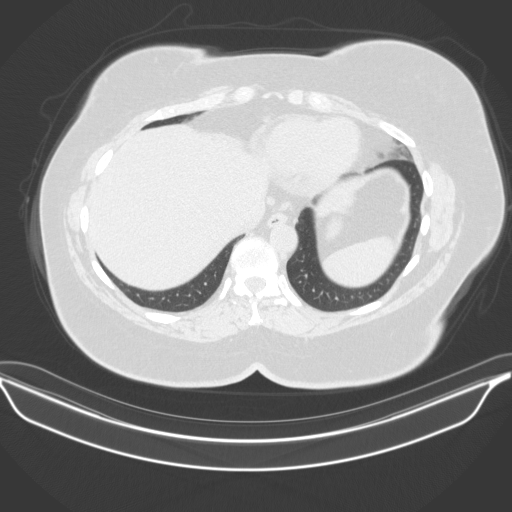
[im 60/149  lung]
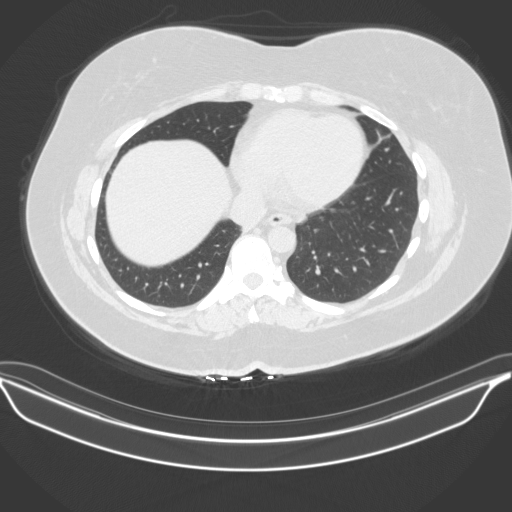
[im 66/149  lung]
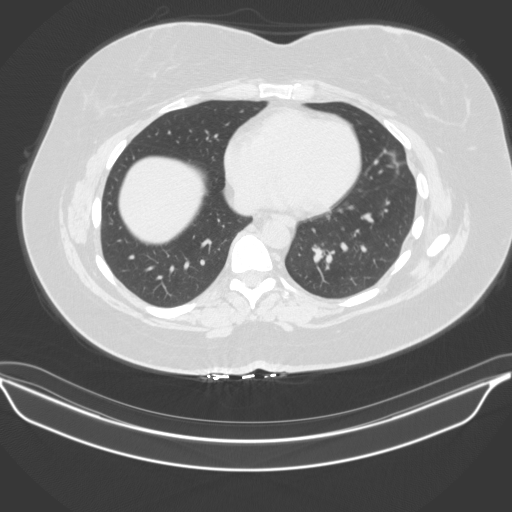
[im 77/149  lung]
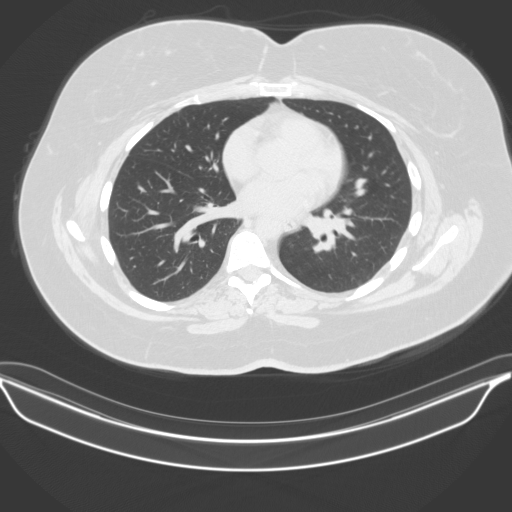
[im 83/149  mediastinal]
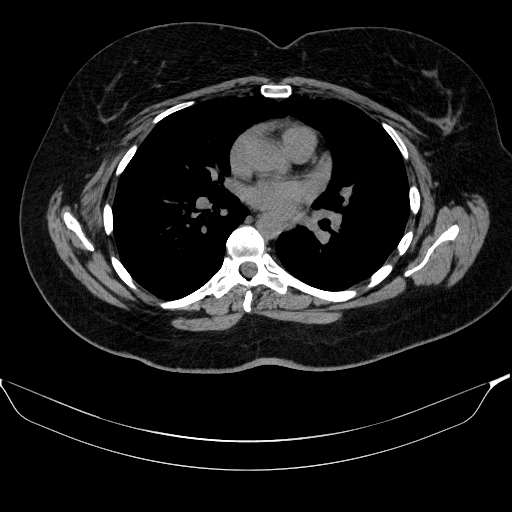
[im 83/149  lung]
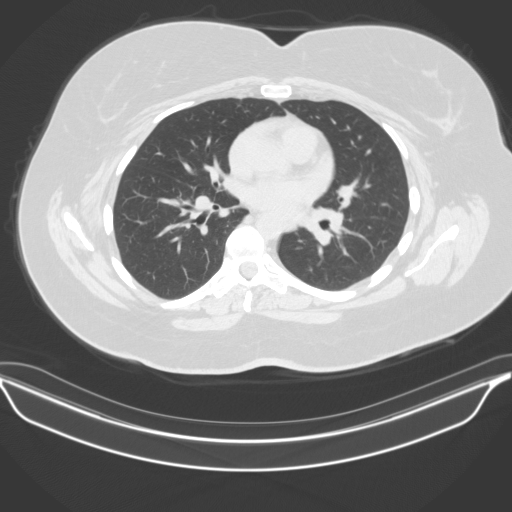
[im 89/149  lung]
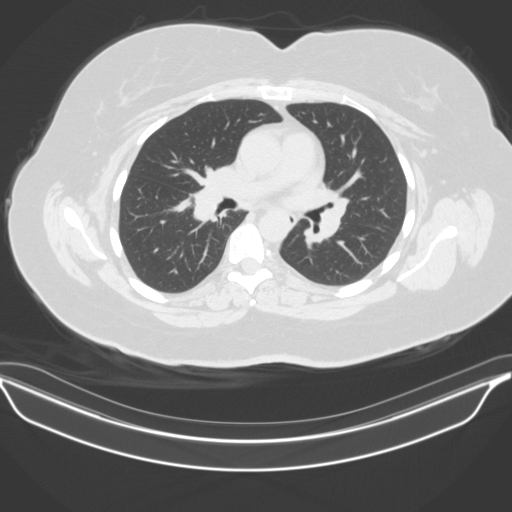
[im 99/149  lung]
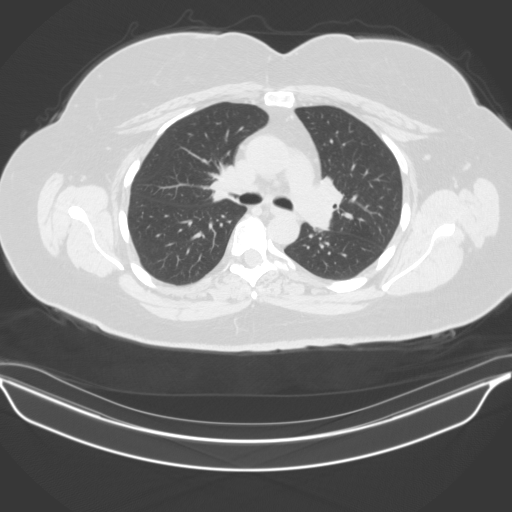
[im 110/149  lung]
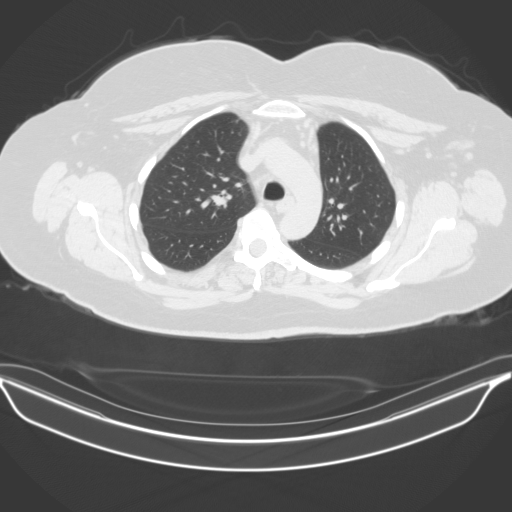
[im 119/149  mediastinal]
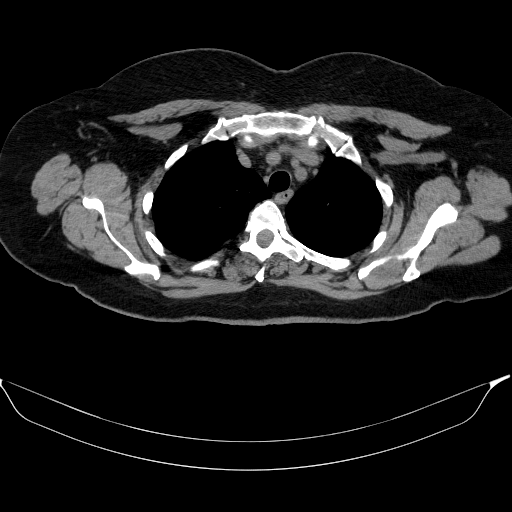
[im 119/149  lung]
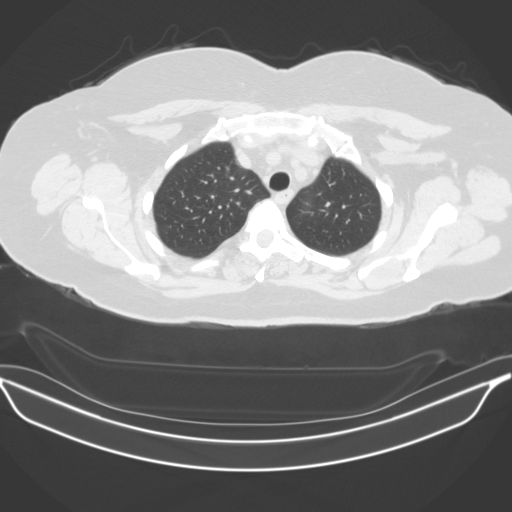
[im 127/149  lung]
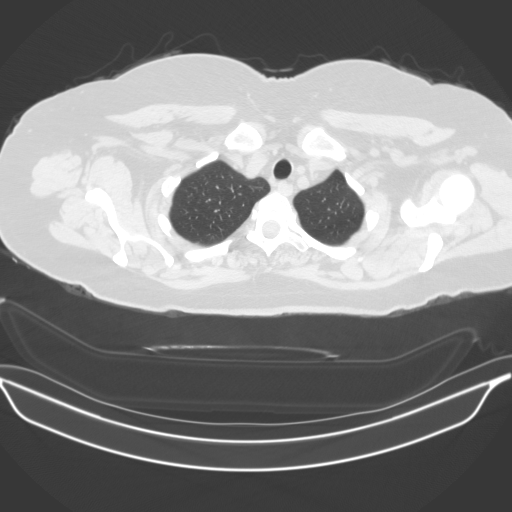
[im 138/149  lung]
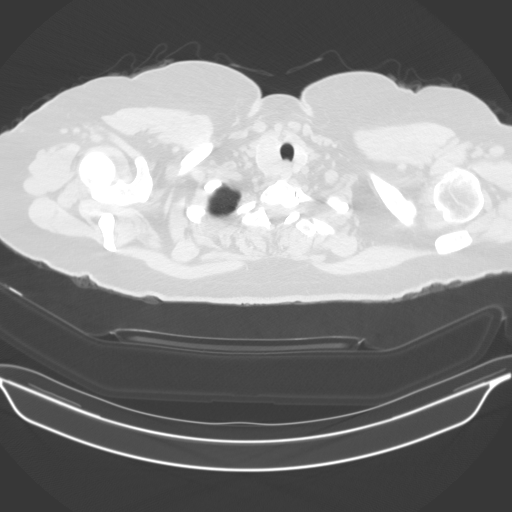

[15 of 34 positions shown; findings below may reference images not displayed]

FINDINGS: Cardiovascular: No significant vascular findings. Normal heart size.
No pericardial effusion.

Mediastinum/Nodes: 3 cm right thyroid nodule is noted. No
significant adenopathy is noted. Esophagus is unremarkable.

Lungs/Pleura: No pneumothorax or pleural effusion is noted. Stable 4
mm nodule is noted in left upper lobe best seen on image number 77
of series 3. This can be considered benign at this point with no
further follow-up required.

Upper Abdomen: Hepatic steatosis.

Musculoskeletal: No chest wall mass or suspicious bone lesions
identified.
IMPRESSION: 3 mm right thyroid nodule is noted. Recommend thyroid US. (Ref: [HOSPITAL]. [DATE]): 143-50).

Stable 4 mm nodule is noted in left upper lobe. This can be
considered benign at this point with no further follow-up required.

Hepatic steatosis.

## 2023-08-13 ENCOUNTER — Other Ambulatory Visit (HOSPITAL_BASED_OUTPATIENT_CLINIC_OR_DEPARTMENT_OTHER): Payer: Self-pay | Admitting: Family Medicine

## 2023-08-13 ENCOUNTER — Other Ambulatory Visit: Payer: Self-pay | Admitting: Pulmonary Disease

## 2023-08-13 ENCOUNTER — Encounter: Payer: Self-pay | Admitting: Sports Medicine

## 2023-08-14 ENCOUNTER — Ambulatory Visit: Payer: BC Managed Care – PPO | Admitting: Obstetrics and Gynecology

## 2023-08-14 LAB — COMPREHENSIVE METABOLIC PANEL
ALT: 29 [IU]/L (ref 0–32)
AST: 31 [IU]/L (ref 0–40)
Albumin: 4.4 g/dL (ref 3.8–4.9)
Alkaline Phosphatase: 103 [IU]/L (ref 44–121)
BUN/Creatinine Ratio: 14 (ref 9–23)
BUN: 11 mg/dL (ref 6–24)
Bilirubin Total: 0.4 mg/dL (ref 0.0–1.2)
CO2: 24 mmol/L (ref 20–29)
Calcium: 9.1 mg/dL (ref 8.7–10.2)
Chloride: 100 mmol/L (ref 96–106)
Creatinine, Ser: 0.77 mg/dL (ref 0.57–1.00)
Globulin, Total: 2.3 g/dL (ref 1.5–4.5)
Glucose: 91 mg/dL (ref 70–99)
Potassium: 4.8 mmol/L (ref 3.5–5.2)
Sodium: 142 mmol/L (ref 134–144)
Total Protein: 6.7 g/dL (ref 6.0–8.5)
eGFR: 90 mL/min/{1.73_m2} (ref >59)

## 2023-08-14 LAB — NMR LIPOPROFILE+LIPIDS+IR
Cholesterol, Total: 235 mg/dL — ABNORMAL HIGH (ref 100–199)
HDL Particle Number: 43.7 umol/L (ref >=30.5)
HDL Size: 9.4 nmol (ref >=9.2)
HDL-C: 69 mg/dL (ref >39)
LDL Particle Number: 1827 nmol/L — ABNORMAL HIGH (ref <1000)
LDL Size: 21.4 nmol (ref >20.5)
LDL Size: 21.4 nmol (ref >=20.8)
LDL-C (NIH Calc): 138 mg/dL — ABNORMAL HIGH (ref 0–99)
LP-IR Score: 40 (ref <=45)
Large HDL-P: 8.1 umol/L (ref >=4.8)
Large VLDL-P: 5.1 nmol/L — ABNORMAL HIGH (ref <=2.7)
Small LDL Particle Number: 714 nmol/L — ABNORMAL HIGH (ref <=527)
Small LDL-P: 714 nmol/L — ABNORMAL HIGH (ref <=527)
Triglycerides: 162 mg/dL — ABNORMAL HIGH (ref 0–149)
VLDL Size: 48.8 nmol — ABNORMAL HIGH (ref <=46.6)

## 2023-08-14 LAB — CBC WITH DIFFERENTIAL/PLATELET
Basophils Absolute: 0.1 10*3/uL (ref 0.0–0.2)
Basos: 1 %
EOS (ABSOLUTE): 0.3 10*3/uL (ref 0.0–0.4)
Eos: 4 %
Hematocrit: 42 % (ref 34.0–46.6)
Hemoglobin: 13.8 g/dL (ref 11.1–15.9)
Immature Grans (Abs): 0 10*3/uL (ref 0.0–0.1)
Immature Granulocytes: 1 %
Lymphocytes Absolute: 1.2 10*3/uL (ref 0.7–3.1)
Lymphs: 16 %
MCH: 30.3 pg (ref 26.6–33.0)
MCHC: 32.9 g/dL (ref 31.5–35.7)
MCV: 92 fL (ref 79–97)
Monocytes Absolute: 0.7 10*3/uL (ref 0.1–0.9)
Monocytes: 9 %
Neutrophils Absolute: 5.4 10*3/uL (ref 1.4–7.0)
Neutrophils: 69 %
Platelets: 442 10*3/uL (ref 150–450)
RBC: 4.56 x10E6/uL (ref 3.77–5.28)
RDW: 13.4 % (ref 11.7–15.4)
WBC: 7.8 10*3/uL (ref 3.4–10.8)

## 2023-08-15 ENCOUNTER — Encounter (HOSPITAL_BASED_OUTPATIENT_CLINIC_OR_DEPARTMENT_OTHER): Payer: Self-pay | Admitting: Pulmonary Disease

## 2023-08-16 ENCOUNTER — Encounter: Payer: Self-pay | Admitting: Obstetrics and Gynecology

## 2023-08-16 ENCOUNTER — Encounter (HOSPITAL_BASED_OUTPATIENT_CLINIC_OR_DEPARTMENT_OTHER): Payer: Self-pay | Admitting: Pulmonary Disease

## 2023-08-16 ENCOUNTER — Ambulatory Visit (INDEPENDENT_AMBULATORY_CARE_PROVIDER_SITE_OTHER): Payer: BC Managed Care – PPO | Admitting: Obstetrics and Gynecology

## 2023-08-16 ENCOUNTER — Encounter (HOSPITAL_BASED_OUTPATIENT_CLINIC_OR_DEPARTMENT_OTHER): Payer: Self-pay | Admitting: Family Medicine

## 2023-08-16 ENCOUNTER — Ambulatory Visit (HOSPITAL_BASED_OUTPATIENT_CLINIC_OR_DEPARTMENT_OTHER): Payer: BC Managed Care – PPO | Admitting: Pulmonary Disease

## 2023-08-16 VITALS — BP 122/84 | HR 72 | Ht 61.0 in | Wt 190.0 lb

## 2023-08-16 VITALS — BP 120/84 | HR 84

## 2023-08-16 DIAGNOSIS — D869 Sarcoidosis, unspecified: Secondary | ICD-10-CM | POA: Diagnosis not present

## 2023-08-16 DIAGNOSIS — R0600 Dyspnea, unspecified: Secondary | ICD-10-CM

## 2023-08-16 DIAGNOSIS — N3281 Overactive bladder: Secondary | ICD-10-CM | POA: Insufficient documentation

## 2023-08-16 DIAGNOSIS — Z79899 Other long term (current) drug therapy: Secondary | ICD-10-CM | POA: Diagnosis not present

## 2023-08-16 DIAGNOSIS — D849 Immunodeficiency, unspecified: Secondary | ICD-10-CM

## 2023-08-16 MED ORDER — FOLIC ACID 1 MG PO TABS: 2.0000 mg | ORAL_TABLET | Freq: Every day | ORAL | 11 refills | Status: DC

## 2023-08-16 MED ORDER — VIBEGRON 75 MG PO TABS: 75.0000 mg | ORAL_TABLET | Freq: Every day | ORAL | 3 refills | Status: DC

## 2023-08-16 NOTE — Progress Notes (Signed)
 [facility] Return Visit  SUBJECTIVE  History of Present Illness: [name] is a 58 y.o. female seen in follow-up for OAB. She has been on Gemtesa 75mg  daily. She is also s/p midurethral sling in Dec 2023.   She feels like she has more urgency. Can usually hold a few hours at a time. Usually leaks on the way to the bathroom. Wakes 1 time a night to urinate. Drinks 20 oz decaf coffee, drinks about a gallon of water per day. She has dry mouth. Denies   No leakage with cough or sneeze.   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/24/2018), Generalized anxiety disorder (09/25/2022), Hypercholesteremia, Hypertension, Intestinal bacterial overgrowth, Joint pain, Kidney stones, Lichen planus (2018), Lumbar pain (07/12/2021), Lymphadenopathy (06/13/2022), Migraines, Mild cognitive impairment (02/02/2023), Mixed hyperlipidemia (12/12/2018), Morbid obesity (11/24/2018), Myalgia, unspecified site (06/21/2021), Nausea in adult, Obstructive sleep apnea, Other allergic rhinitis (04/04/2022), Pain in thoracic spine (07/18/2021), Sarcoidosis, Sleep disturbance (10/08/2019), Splenic lesion (05/20/2022), Subclinical hypothyroidism (09/2009), and Type 2 diabetes mellitus without complication, without long-term current use of insulin (05/05/2022).   Past Surgical History: She  has a past surgical history that includes Novasure ablation (12/18/2006); Lithotripsy; Rotator cuff repair (Right, 06/20/2007); Colonoscopy; Radioactive seed guided excisional breast biopsy (Right, 05/30/2021); Bladder suspension (N/A, 06/05/2022); Cystoscopy (N/A, 06/05/2022); Lymph node dissection; and Biopsy thyroid (Right, 07/07/2021).   Medications: She has a current medication list which includes the following prescription(s): aspirin ec, breztri  aerosphere, coenzyme q10, garlic, lidocaine, lorazepam, magnesium, metformin, methotrexate, nystatin-triamcinolone ointment, omeprazole, ondansetron, prednisone, tirzepatide, folic acid, and vibegron.   Allergies: Patient is allergic to tizanidine hcl and clarithromycin.   Social History: Patient  reports that she has never smoked. She has never been exposed to tobacco smoke. She has never used smokeless tobacco. She reports that she does not drink alcohol and does not use drugs.     OBJECTIVE     Physical Exam: Vitals:   08/16/23 0825  BP: 120/84  Pulse: 84   Gen: No apparent distress, A&O x 3.  Detailed Urogynecologic Evaluation:  Deferred.   ASSESSMENT AND PLAN    Ms. Jezewski is a 58 y.o. with:  1. Overactive bladder     Overactive bladder Assessment & Plan: - We discussed that she should modify her fluid intake to decrease coffee and water. If she is drinking over 130oz of water, should try to reduce to <100oz and see if this improves her symptoms.  - We reviewed alternative treatment options such as pelvic physical therapy, PTNS, intravesical botox and sacral nerve stimulation. She may be interested in trying PTNS and handout provided for her to review.  - For now, she wants to stay on the gemtesa and reassess after fluid modifications  Orders: -     Vibegron; Take 1 tablet (75 mg total) by mouth daily.  Dispense: 90 tablet; Refill: 3  Return 1 year or sooner if needed   Marguerita Beards, MD

## 2023-08-16 NOTE — Patient Instructions (Addendum)
  Sarcoid with deconditioning High risk medication management --Continue methotrexate 20 mg weekly. Plan for 18-24 month course with plan to start weaning off 04/2024. --ORDER CBC with diff and CMET for end of April 2025 --Off prednisone since 06/2023 --OK to stop omeprazole now off steroids --If joint persists despite over the counter ibuprofen and tylenol, please contact your rheumatologist (Dr. Dimple Casey) for appointment  Shortness of breath - improved --PFTs and echo normal --CONTINUE Breztri ONE puff TWICE a day as needed

## 2023-08-16 NOTE — Progress Notes (Signed)
 Synopsis: Referred in April 2024 for abnormal PET scan, adenopathy by Hilbert Bible, *  Subjective:   PATIENT ID: [name] GENDER: female DOB: [birth year], MRN: [id number]  Chief Complaint  Patient presents with   Follow-up    Sarcoidosis   Synopsis: This is a 58 year old female, past medical history of back pain, constipation, type 2 diabetes, hypertension, kidney stones.  Presents with a new diagnosis of sarcoidosis after a inguinal node excision that shows not nonnecrotizing granulomas.  She has abnormal pet imaging that shows mediastinal and hilar adenopathy as well as splenic involvement. She reports that she has had difficulty concentrating, fatigue and brain fog for over a year.  She was started on prednisone and methotrexate by rheumatology.  She has had an eye exam and there is no evidence of uveitis.     11/27/22 She is a new patient to me to establish care for sarcoid management. She has been on methotrexate 20 mg weekly since 10/22/22. This was started by Rheumatology. Currently still on prednisone 10 mg, has been this medication since March 2024. Feels fatigued prior to her weekly dosing of methotrexate on Sunday and then will feel worse after taking it on Mondays. She currently is followed by GI for belching and nausea and has had negative work-up. She continues to have shortness of breath with exertion. She reports brain fog and balance issues sometimes. Denies falling. Will need to hold onto things due to floating sensation at times.   02/01/23 Since our last visit she has been enrolled in Pulmonary rehab. She continues methotrexate 20 mg and folic acid daily. She felt benefit with Breztri ONE puff twice a day. Requesting refills because it helped with her shortness of breath and tolerating walking up steps easier. Denies cough or wheezing. Cough has nearly resolved. Did feel more fatigued when stepped down to prednisone 5 mg. Started taking advil for general body aches  and pains. Continues to have night sweats.  05/21/23 She has been participating in Pulmonary rehab. She has shortness of breath with activity. No wheezing or coughing. Not using Breztri as consistently. She is on methotrexate 20 mg weekly and folic acid daily. Started Acthar in 02/2023 however stopped in 03/2023 and headaches improved. Remains on prednisone 10 mg daily. Since our last visit she has had normal ONO.   08/16/23 She reports being off of prednisone since end of January and feeling tired with achy joints in her hand. Does admit being sedentary at home. Goes to the gym five days a week but primary does weight for 45-60 min. Using [place] three times a week. Denies significant shortness of breath, cough or wheezing.  Past Medical History:  Diagnosis Date   Back pain    Constipation    Contact dermatitis 02/11/2021   Elevated LFTs 12/12/2018   Fatty liver    Fen-phen history 11/24/2018   Generalized anxiety disorder 09/25/2022   Hypercholesteremia    Hypertension    Intestinal bacterial overgrowth    Joint pain    Kidney stones    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   Lumbar pain 07/12/2021   Lymphadenopathy 06/13/2022   Migraines    Mild cognitive impairment 02/02/2023   Mixed hyperlipidemia 12/12/2018   Morbid obesity 11/24/2018   Myalgia, unspecified site 06/21/2021   Nausea in adult    Obstructive sleep apnea    mild, no CPAP   Other allergic rhinitis 04/04/2022   Pain in thoracic spine 07/18/2021   Sarcoidosis  Sleep disturbance 10/08/2019   Splenic lesion 05/20/2022   Subclinical hypothyroidism 09/2009   Type 2 diabetes mellitus without complication, without long-term current use of insulin 05/05/2022     Family History  Problem Relation Age of Onset   Diabetes Mother    Hyperlipidemia Mother    Thyroid disease Mother    Anxiety disorder Mother    Obesity Mother    Aortic stenosis Mother    Arthritis-Osteo Sister    Colon cancer Paternal Grandmother    Heart  disease Paternal Grandfather    Pancreatic cancer Neg Hx    Stomach cancer Neg Hx    Liver disease Neg Hx    Esophageal cancer Neg Hx      Past Surgical History:  Procedure Laterality Date   BIOPSY THYROID Right 07/07/2021   FNA Right nodule benign   BLADDER SUSPENSION N/A 06/05/2022   Procedure: TRANSVAGINAL TAPE (TVT) PROCEDURE;  Surgeon: Marguerita Beards, MD;  Location: [facility];  Service: Gynecology;  Laterality: N/A;  total time requested is 1 hour   COLONOSCOPY     CYSTOSCOPY N/A 06/05/2022   Procedure: CYSTOSCOPY;  Surgeon: Marguerita Beards, MD;  Location[facility];  Service: Gynecology;  Laterality: N/A;   LITHOTRIPSY     LYMPH NODE DISSECTION     NOVASURE ABLATION  12/18/2006   RADIOACTIVE SEED GUIDED EXCISIONAL BREAST BIOPSY Right 05/30/2021   Procedure: RADIOACTIVE SEED GUIDED EXCISIONAL RIGHT BREAST BIOPSY;  Surgeon: Emelia Loron, MD;  Location: [facility];  Service: General;  Laterality: Right;   ROTATOR CUFF REPAIR Right 06/20/2007    Social History   Socioeconomic History   Marital status: Married    Spouse name: Alinda Money   Number of children: 2   Years of education: 16   Highest education level: Bachelor's degree (e.g., BA, AB, BS)  Occupational History   Occupation: IT trainer  Tobacco Use   Smoking status: Never    Passive exposure: Never   Smokeless tobacco: Never  Vaping Use   Vaping status: Never Used  Substance and Sexual Activity   Alcohol use: No    Alcohol/week: 0.0 standard drinks of alcohol   Drug use: No   Sexual activity: Yes    Partners: Male    Birth control/protection: Surgical    Comment: Ablation, Vasectomy  Other Topics Concern   Not on file  Social History Narrative   Right handed   Drinks coffee   Lives with husband   Two story home   unemployed   Social Drivers of Health   Financial Resource Strain: Low Risk  (04/23/2023)   Overall Financial Resource Strain (CARDIA)     Difficulty of Paying Living Expenses: Not hard at all  Food Insecurity: No Food Insecurity (04/23/2023)   Hunger Vital Sign    Worried About Running Out of Food in the Last Year: Never true    Ran Out of Food in the Last Year: Never true  Transportation Needs: No Transportation Needs (04/23/2023)   PRAPARE - Administrator, Civil Service (Medical): No    Lack of Transportation (Non-Medical): No  Physical Activity: Unknown (04/23/2023)   Exercise Vital Sign    Days of Exercise per Week: 3 days    Minutes of Exercise per Session: Not on file  Stress: No Stress Concern Present (04/23/2023)   [employer] of Occupational Health - Occupational Stress Questionnaire    Feeling of Stress : Not at all  Social Connections: Moderately  Isolated (04/23/2023)   Social Connection and Isolation Panel [NHANES]    Frequency of Communication with Friends and Family: More than three times a week    Frequency of Social Gatherings with Friends and Family: Once a week    Attends Religious Services: Never    Database administrator or Organizations: No    Attends Engineer, structural: Not on file    Marital Status: Married  Intimate Partner Violence: Unknown (09/23/2021)   Received from [employer], [facility]   HITS    Physically Hurt: Not on file    Insult or Talk Down To: Not on file    Threaten Physical Harm: Not on file    Scream or Curse: Not on file     Allergies  Allergen Reactions   Tizanidine Hcl Other (See Comments)    Cant move Cant move Zanaflex - brand name   Clarithromycin Nausea Only    Severe stomach cramps Severe stomach cramps     Outpatient Medications Prior to Visit  Medication Sig Dispense Refill   aspirin EC 81 MG tablet Take 81 mg by mouth daily. Swallow whole.     BREZTRI AEROSPHERE 160-9-4.8 MCG/ACT AERO Inhale 2 puffs into the lungs in the morning and at bedtime. 32.1 g 3   Coenzyme Q10 (COQ-10 PO) Take by mouth.     folic acid (FOLVITE) 1  MG tablet Take 2 tablets (2 mg total) by mouth daily. 180 tablet 11   GARLIC PO Take by mouth.     lidocaine (XYLOCAINE) 5 % ointment Apply 1 Application topically 3 (three) times daily. Use as needed. 1.25 g 2   LORazepam (ATIVAN) 0.5 MG tablet 1-2 tabs 30 - 60 min prior to MRI. Do not drive with this medicine. 4 tablet 0   MAGNESIUM PO Take by mouth.     metFORMIN (GLUCOPHAGE-XR) 500 MG 24 hr tablet Take 2 tablets (1,000 mg total) by mouth daily before supper. 180 tablet 3   methotrexate (RHEUMATREX) 2.5 MG tablet Take 8 tablets (20 mg total) by mouth once a week. 96 tablet 2   nystatin-triamcinolone ointment (MYCOLOG) Apply 1 Application topically 2 (two) times daily. Use for a one week as needed. 30 g 2   omeprazole (PRILOSEC) 20 MG capsule Take 1 capsule (20 mg total) by mouth daily. 90 capsule 5   ondansetron (ZOFRAN) 4 MG tablet Take 1 tablet (4 mg total) by mouth daily at 2 PM. 30 tablet 0   tirzepatide (MOUNJARO) 7.5 MG/0.5ML Pen Inject 7.5 mg into the skin once a week. 6 mL 3   Vibegron 75 MG TABS Take 1 tablet (75 mg total) by mouth daily. 90 tablet 3   predniSONE (DELTASONE) 5 MG tablet Take 2 tablets (10 mg total) by mouth daily with breakfast. (Patient not taking: Reported on 08/16/2023) 60 tablet 2   No facility-administered medications prior to visit.    Review of Systems  Constitutional:  Positive for malaise/fatigue. Negative for chills, diaphoresis, fever and weight loss.  HENT:  Negative for congestion.   Respiratory:  Negative for cough, hemoptysis, sputum production, shortness of breath and wheezing.   Cardiovascular:  Negative for chest pain, palpitations and leg swelling.  Musculoskeletal:  Positive for joint pain and myalgias.     Objective:   Vitals:   08/16/23 0934  BP: 122/84  Pulse: 72  SpO2: 97%  Weight: 190 lb (86.2 kg)  Height: 5\' 1"  (1.549 m)    97% on RA BMI Readings  from Last 3 Encounters:  08/16/23 35.90 kg/m  07/25/23 36.39 kg/m  06/27/23  36.84 kg/m   Wt Readings from Last 3 Encounters:  08/16/23 190 lb (86.2 kg)  07/25/23 192 lb 9.6 oz (87.4 kg)  06/27/23 195 lb (88.5 kg)   Physical Exam: General: Well-appearing, no acute distress HENT: [place], AT Eyes: EOMI, no scleral icterus Respiratory: Clear to auscultation bilaterally.  No crackles, wheezing or rales Cardiovascular: RRR, -M/R/G, no JVD Extremities:-Edema,-tenderness Neuro: AAO x4, CNII-XII grossly intact Psych: Normal mood, normal affect   Data Reviewed:  Labs:    Latest Ref Rng & Units 08/13/2023    8:26 AM 05/21/2023    9:36 AM 02/01/2023   10:22 AM  CBC  WBC 3.4 - 10.8 x10E3/uL 7.8  13.9  12.8   Hemoglobin 11.1 - 15.9 g/dL 16.1  09.6  04.5   Hematocrit 34.0 - 46.6 % 42.0  42.8  42.7   Platelets 150 - 450 x10E3/uL 442  425  373   Resolved leukocytosis     Latest Ref Rng & Units 08/13/2023    8:26 AM 05/21/2023    9:36 AM 04/03/2023    9:58 AM  CMP  Glucose 70 - 99 mg/dL 91  409    BUN 6 - 24 mg/dL 11  12    Creatinine 8.11 - 1.00 mg/dL 9.14  7.82    Sodium 956 - 144 mmol/L 142  140    Potassium 3.5 - 5.2 mmol/L 4.8  4.5    Chloride 96 - 106 mmol/L 100  100    CO2 20 - 29 mmol/L 24  22    Calcium 8.7 - 10.2 mg/dL 9.1  9.4    Total Protein 6.0 - 8.5 g/dL 6.7  6.7  6.6   Total Bilirubin 0.0 - 1.2 mg/dL 0.4  0.3    Alkaline Phos 44 - 121 IU/L 103  90    AST 0 - 40 IU/L 31  24    ALT 0 - 32 IU/L 29  22    Stable electrolytes, kidney function and liver  Chest Imaging:  05/24/2022 which reveals hypermetabolic adenopathy and splenic involvement consistent with sarcoid. CT Chest 09/13/22 - Stable 5 mm left upper lobe pulmonary nodule. Stable mediastinal and hilar lymph nodes. Overall normal parenchyma PET/CT 05/07/23 - Interval resolution of previous tracer avid lymphadenopathy within CAP and in liver and spleen.  Pulmonary Functions Testing Results:    Latest Ref Rng & Units 11/17/2022    8:44 AM  PFT Results  FVC-Pre L 2.41   FVC-Predicted Pre  % 77   FVC-Post L 2.44   FVC-Predicted Post % 78   Pre FEV1/FVC % % 86   Post FEV1/FCV % % 87   FEV1-Pre L 2.07   FEV1-Predicted Pre % 85   FEV1-Post L 2.11   DLCO uncorrected ml/min/mmHg 19.26   DLCO UNC% % 102   DLCO corrected ml/min/mmHg 19.26   DLCO COR %Predicted % 102   DLVA Predicted % 118   TLC L 4.15   TLC % Predicted % 88   RV % Predicted % 96   11/17/22 - Interpretation: Normal PFTs. 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 Correlation with clinical and radiological findings is recommended.   Echocardiogram:  11/16/22 - Normal EF.      Assessment & Plan:   No diagnosis found.  Discussion: Patient has had evidence of sarcoid since  05/24/2022 with extensive hypermetabolic lymphadenopathy involving the chest, abdomen and pelvis and hepatic and diffuse splenic hypermetabolic disease. Her symptoms worsened to the point of needing immunosuppression starting in Feb 2024 initiated by her Rheumatologist. Sarcoids remains uncontrolled on methotrexate 20 mg. Trialed acthar from 02/2023 to 03/2023 but discontinued due to headaches. PET/CT reviewed and improved hypermetabolic activity  Sarcoid with deconditioning High risk medication management --Continue methotrexate 20 mg weekly with folic acid 2 mg daily. Plan for 18-24 month course with plan to start weaning off 04/2024. --ORDER CBC with diff and CMET for end of April 2025 --Off prednisone since 06/2023 --OK to stop omeprazole now off steroids --If joint persists despite over the counter ibuprofen and tylenol, please contact your rheumatologist (Dr. Dimple Casey) for appointment  Shortness of breath - improved --PFTs and echo normal --CONTINUE Breztri ONE puff TWICE a day as needed  History of OSA Fatigue, excessive daytime sleepiness --ONO 2024 normal  Brain fog --Following [facility] Neuro[facility]arcoid clinic [facility] Champ Mungo, MD @ [facility])  --Planning for sleep study  Current Outpatient Medications:    aspirin EC 81 MG tablet, Take 81 mg by mouth daily. Swallow whole., Disp: , Rfl:    BREZTRI AEROSPHERE 160-9-4.8 MCG/ACT AERO, Inhale 2 puffs into the lungs in the morning and at bedtime., Disp: 32.1 g, Rfl: 3   Coenzyme Q10 (COQ-10 PO), Take by mouth., Disp: , Rfl:    folic acid (FOLVITE) 1 MG tablet, Take 2 tablets (2 mg total) by mouth daily., Disp: 180 tablet, Rfl: 11   GARLIC PO, Take by mouth., Disp: , Rfl:    lidocaine (XYLOCAINE) 5 % ointment, Apply 1 Application topically 3 (three) times daily. Use as needed., Disp: 1.25 g, Rfl: 2   LORazepam (ATIVAN) 0.5 MG tablet, 1-2 tabs 30 - 60 min prior to MRI. Do not drive with this medicine., Disp: 4 tablet, Rfl: 0   MAGNESIUM PO, Take by mouth., Disp: , Rfl:    metFORMIN (GLUCOPHAGE-XR) 500 MG 24 hr tablet, Take 2 tablets (1,000 mg total) by mouth daily before supper., Disp: 180 tablet, Rfl: 3   methotrexate (RHEUMATREX) 2.5 MG tablet, Take 8 tablets (20 mg total) by mouth once a week., Disp: 96 tablet, Rfl: 2   nystatin-triamcinolone ointment (MYCOLOG), Apply 1 Application topically 2 (two) times daily. Use for a one week as needed., Disp: 30 g, Rfl: 2   omeprazole (PRILOSEC) 20 MG capsule, Take 1 capsule (20 mg total) by mouth daily., Disp: 90 capsule, Rfl: 5   ondansetron (ZOFRAN) 4 MG tablet, Take 1 tablet (4 mg total) by mouth daily at 2 PM., Disp: 30 tablet, Rfl: 0   tirzepatide (MOUNJARO) 7.5 MG/0.5ML Pen, Inject 7.5 mg into the skin once a week., Disp: 6 mL, Rfl: 3   Vibegron 75 MG TABS, Take 1 tablet (75 mg total) by mouth daily., Disp: 90 tablet, Rfl: 3   predniSONE (DELTASONE) 5 MG tablet, Take 2 tablets (10 mg total) by mouth daily with breakfast. (Patient not taking: Reported on 08/16/2023), Disp: 60 tablet, Rfl: 2  I have spent a total time of 40-minutes on the day of the appointment including chart review, data review, collecting history,  coordinating care and discussing medical diagnosis and plan with the patient/family. Past medical history, allergies, medications were reviewed. Pertinent imaging, labs and tests included in this note have been reviewed and interpreted independently by me.  Tigerlily Christine Mechele Collin, MD [facility] 08/16/2023 10:00 AM

## 2023-08-16 NOTE — Progress Notes (Signed)
 Hi [name], Looking at your lipo profile, there is only been slight decrease in your LDL and total cholesterol little when compared to your previous lipid panel.  I believe that you are still at a higher risk for coronary artery disease, heart attack and stroke with levels this high.  I would recommend medication at this time and if you are open to a discussion regarding this please let me know.

## 2023-08-16 NOTE — Assessment & Plan Note (Signed)
-   We discussed that she should modify her fluid intake to decrease coffee and water. If she is drinking over 130oz of water, should try to reduce to <100oz and see if this improves her symptoms.  - We reviewed alternative treatment options such as pelvic physical therapy, PTNS, intravesical botox and sacral nerve stimulation. She may be interested in trying PTNS and handout provided for her to review.  - For now, she wants to stay on the gemtesa and reassess after fluid modifications

## 2023-08-17 NOTE — Discharge Instructions (Signed)

## 2023-08-20 ENCOUNTER — Ambulatory Visit
Admission: RE | Admit: 2023-08-20 | Discharge: 2023-08-20 | Disposition: A | Discharge status: Home / Self Care | Payer: BC Managed Care – PPO | Source: Ambulatory Visit | Attending: Sports Medicine | Admitting: Sports Medicine

## 2023-08-20 DIAGNOSIS — M9908 Segmental and somatic dysfunction of rib cage: Secondary | ICD-10-CM

## 2023-08-20 DIAGNOSIS — M542 Cervicalgia: Secondary | ICD-10-CM

## 2023-08-20 DIAGNOSIS — M9901 Segmental and somatic dysfunction of cervical region: Secondary | ICD-10-CM

## 2023-08-20 DIAGNOSIS — R200 Anesthesia of skin: Secondary | ICD-10-CM

## 2023-08-20 DIAGNOSIS — M9902 Segmental and somatic dysfunction of thoracic region: Secondary | ICD-10-CM

## 2023-08-20 MED ORDER — TRIAMCINOLONE ACETONIDE 40 MG/ML IJ SUSP (RADIOLOGY)
60.0000 mg | Freq: Once | INTRAMUSCULAR | Status: AC
  Administered 2023-08-20: 60 mg via EPIDURAL

## 2023-08-20 MED ORDER — IOPAMIDOL (ISOVUE-M 300) INJECTION 61%
1.0000 mL | Freq: Once | INTRAMUSCULAR | Status: AC | PRN
  Administered 2023-08-20: 1 mL via EPIDURAL

## 2023-08-21 ENCOUNTER — Encounter: Payer: Self-pay | Admitting: Obstetrics and Gynecology

## 2023-08-21 ENCOUNTER — Other Ambulatory Visit (HOSPITAL_BASED_OUTPATIENT_CLINIC_OR_DEPARTMENT_OTHER): Payer: Self-pay | Admitting: Family Medicine

## 2023-08-21 DIAGNOSIS — E782 Mixed hyperlipidemia: Secondary | ICD-10-CM

## 2023-08-21 NOTE — Telephone Encounter (Signed)
 Jon Gills, please see mychart response sent by pt.

## 2023-08-21 NOTE — Telephone Encounter (Signed)
 Called and spoke with pt and have scheduled her for a 6 month follow up. Pt will plan on coming a few days before the visit to have labs drawn so that way the labs can be discussed during her OV.  Routing to [employer] as an Financial planner.

## 2023-08-24 ENCOUNTER — Ambulatory Visit (HOSPITAL_BASED_OUTPATIENT_CLINIC_OR_DEPARTMENT_OTHER): Payer: BC Managed Care – PPO | Admitting: Family Medicine
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
## 2023-09-03 ENCOUNTER — Ambulatory Visit (INDEPENDENT_AMBULATORY_CARE_PROVIDER_SITE_OTHER): Admitting: Obstetrics and Gynecology

## 2023-09-03 ENCOUNTER — Encounter: Payer: Self-pay | Admitting: Obstetrics and Gynecology

## 2023-09-03 ENCOUNTER — Ambulatory Visit (INDEPENDENT_AMBULATORY_CARE_PROVIDER_SITE_OTHER): Admitting: Sports Medicine

## 2023-09-03 VITALS — BP 132/74 | HR 106 | Ht 61.0 in | Wt 190.0 lb

## 2023-09-03 VITALS — BP 122/83 | HR 90

## 2023-09-03 DIAGNOSIS — M542 Cervicalgia: Secondary | ICD-10-CM | POA: Diagnosis not present

## 2023-09-03 DIAGNOSIS — R350 Frequency of micturition: Secondary | ICD-10-CM

## 2023-09-03 DIAGNOSIS — N3281 Overactive bladder: Secondary | ICD-10-CM

## 2023-09-03 DIAGNOSIS — R200 Anesthesia of skin: Secondary | ICD-10-CM

## 2023-09-03 NOTE — Progress Notes (Signed)
 [facility]  PTNS VISIT  CC:  Overactive bladder  58 y.o. with refractory overactive bladder who presents for percutaneous tibial nerve stimulation. The patient presents for PTNS session # 1.   Procedure: The patient was placed in the sitting position and the left lower extremity was prepped in the usual fashion. The PTNS needle was then inserted at a 60 degree angle, 5 cm cephalad and 2 cm posterior to the medial malleolus. The PTNS unit was then programmed and an optimal response was noted at 5 milliamps. The PTNS stimulation was then performed at this setting for 30 minutes without incident and the patient tolerated the procedure well. The needle was removed and hemostasis was noted.   The pt will return in 1 week for PTNS session # 2. All questions were answered.

## 2023-09-03 NOTE — Addendum Note (Signed)
 Addended by: Jerene Canny R on: 09/03/2023 10:08 AM   Modules accepted: Orders

## 2023-09-03 NOTE — Patient Instructions (Addendum)
 Nerve conduction referral  Call us if you would like gabapentin  Follow up 1-2 weeks after nerve conduction study

## 2023-09-06 ENCOUNTER — Encounter: Payer: Self-pay | Admitting: Sports Medicine

## 2023-09-06 ENCOUNTER — Encounter (HOSPITAL_BASED_OUTPATIENT_CLINIC_OR_DEPARTMENT_OTHER): Payer: Self-pay | Admitting: Pulmonary Disease

## 2023-09-06 NOTE — Telephone Encounter (Signed)
**Note De-identified  [place] Obfuscation** Please advise 

## 2023-09-10 ENCOUNTER — Encounter: Payer: Self-pay | Admitting: Obstetrics and Gynecology

## 2023-09-10 ENCOUNTER — Ambulatory Visit (INDEPENDENT_AMBULATORY_CARE_PROVIDER_SITE_OTHER): Admitting: Obstetrics and Gynecology

## 2023-09-10 VITALS — BP 122/81 | HR 77

## 2023-09-10 DIAGNOSIS — N3281 Overactive bladder: Secondary | ICD-10-CM | POA: Diagnosis not present

## 2023-09-10 DIAGNOSIS — R350 Frequency of micturition: Secondary | ICD-10-CM

## 2023-09-10 NOTE — Progress Notes (Signed)
 [facility]  PTNS VISIT  CC:  Overactive bladder  58 y.o. with refractory overactive bladder who presents for percutaneous tibial nerve stimulation. The patient presents for PTNS session # 2.   Procedure: The patient was placed in the sitting position and the right lower extremity was prepped in the usual fashion. The PTNS needle was then inserted at a 60 degree angle, 5 cm cephalad and 2 cm posterior to the medial malleolus. The PTNS unit was then programmed and an optimal response was noted at 5 milliamps. The PTNS stimulation was then performed at this setting for 30 minutes without incident and the patient tolerated the procedure well. The needle was removed and hemostasis was noted.   The pt will return in 1 week for PTNS session # 3. All questions were answered.

## 2023-09-11 ENCOUNTER — Ambulatory Visit: Payer: BC Managed Care – PPO | Admitting: Internal Medicine

## 2023-09-17 ENCOUNTER — Ambulatory Visit (INDEPENDENT_AMBULATORY_CARE_PROVIDER_SITE_OTHER): Admitting: Obstetrics and Gynecology

## 2023-09-17 ENCOUNTER — Encounter: Payer: Self-pay | Admitting: Obstetrics and Gynecology

## 2023-09-17 VITALS — BP 128/60 | HR 100

## 2023-09-17 DIAGNOSIS — N3281 Overactive bladder: Secondary | ICD-10-CM

## 2023-09-17 DIAGNOSIS — R350 Frequency of micturition: Secondary | ICD-10-CM

## 2023-09-17 NOTE — Progress Notes (Signed)
 [facility]  PTNS VISIT  CC:  Overactive bladder  58 y.o. with refractory overactive bladder who presents for percutaneous tibial nerve stimulation. The patient presents for PTNS session # 3.   Procedure: The patient was placed in the sitting position and the left lower extremity was prepped in the usual fashion. The PTNS needle was then inserted at a 60 degree angle, 5 cm cephalad and 2 cm posterior to the medial malleolus. The PTNS unit was then programmed and an optimal response was noted at 6 milliamps. The PTNS stimulation was then performed at this setting for 30 minutes without incident and the patient tolerated the procedure well. The needle was removed and hemostasis was noted.   The pt will return in 1 week for PTNS session # 4. All questions were answered.

## 2023-09-18 NOTE — Progress Notes (Unsigned)
 Name: [name]  MRN/ DOB: [id number], 1966/03/05   Age/ Sex: 58 y.o., female    PCP: Hilbert Bible, FNP   Reason for Endocrinology Evaluation: Type 2 Diabetes Mellitus     Date of Initial Endocrinology Visit: 05/05/2022    PATIENT IDENTIFIER: [name] is a 58 y.o. female with a past medical history of OSA, DM and HTN, Sarcoidosis (Dx 2024). The patient presented for initial endocrinology clinic visit on 05/05/2022 for consultative assistance with her diabetes management.    HPI: [name] was    Diagnosed with DM 03/2021 Prior Medications tried/Intolerance: Metformin started 01/2022 and Jardiance 03/2022 but this was discontinued due to recurrent genital infections 04/2022. Ozempic -nausea  Hemoglobin A1c has ranged from 5.7% in 2018, peaking at 6.5% in 03/2022.  On her initial visit to our clinic she had an A1c 6.0%, she was on jardiance and Metformin, we stopped Jardiance due to recurrent genital infections    Started Mounjaro/2024   THYROID HISTORY: Patient was noted with multinodular goiter on thyroid ultrasound 04/2021.  She is S/P FNA of the right mid 2.8 cm nodule with atypia of undetermined significance (Bethesda category III). Per pt Afirma was benign    Due to symptoms of dysphagia, barium swallow was performed which was normal 05/2022    SUBJECTIVE:   During the last visit (03/21/2023): A1c 5.7%      Today (09/18/23): [name] is here for a follow up on diabetes management and MNG .  She checks her blood sugars 1 times daily. The patient has not had hypoglycemic episodes since the last clinic visit.  She is undergoing pulmonary rehab for pulmonary sarcoidosis, she is on methotrexate and folic acid, she felt with fatigue with stepping down to prednisone 5 mg, she is on corticotropin gel  IM Q  72 hrs  She was seen by GYN 03/06/2023 for dysuria She follows with neurology for mild cognitive impairment   Sob with exertion  She  continues with fatigue    Chronic nausea has improved  Denies vomiting  Denies constipation or diarrhea  Denies local neck swelling  Denies palpitations  Denies tremors  Has tingling and numbness of arms and legs , she has a follow up with neurology    HOME DIABETES REGIMEN: Metformin 500 mg 2 tabs daily with supper  Mounjaro 7.5 mg weekly Correction factor: NovoLog (BG -120/35) TIDQAC and QHS    Statin: no ACE-I/ARB: yes Prior Diabetic Education: no  METER DOWNLOAD SUMMARY: 98-290 mg/dL    DIABETIC COMPLICATIONS: Microvascular complications:   Denies: CKD, retinopathy, neuropathy  Last eye exam: Completed 11/2021  Macrovascular complications:   Denies: CAD, PVD, CVA   PAST HISTORY: Past Medical History:  Past Medical History:  Diagnosis Date   Back pain    Constipation    Contact dermatitis 02/11/2021   Elevated LFTs 12/12/2018   Fatty liver    Fen-phen history 11/24/2018   Generalized anxiety disorder 09/25/2022   Hypercholesteremia    Hypertension    Intestinal bacterial overgrowth    Joint pain    Kidney stones    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   Lumbar pain 07/12/2021   Lymphadenopathy 06/13/2022   Migraines    Mild cognitive impairment 02/02/2023   Mixed hyperlipidemia 12/12/2018   Morbid obesity 11/24/2018   Myalgia, unspecified site 06/21/2021   Nausea in adult    Obstructive sleep apnea    mild, no CPAP   Other allergic rhinitis 04/04/2022  Pain in thoracic spine 07/18/2021   Sarcoidosis    Sleep disturbance 10/08/2019   Splenic lesion 05/20/2022   Subclinical hypothyroidism 09/2009   Type 2 diabetes mellitus without complication, without long-term current use of insulin 05/05/2022   Past Surgical History:  Past Surgical History:  Procedure Laterality Date   BIOPSY THYROID Right 07/07/2021   FNA Right nodule benign   BLADDER SUSPENSION N/A 06/05/2022   Procedure: TRANSVAGINAL TAPE (TVT) PROCEDURE;  Surgeon: Marguerita Beards,  MD;  Location: [facility];  Service: Gynecology;  Laterality: N/A;  total time requested is 1 hour   COLONOSCOPY     CYSTOSCOPY N/A 06/05/2022   Procedure: CYSTOSCOPY;  Surgeon: Marguerita Beards, MD;  Location: [facility];  Service: Gynecology;  Laterality: N/A;   LITHOTRIPSY     LYMPH NODE DISSECTION     NOVASURE ABLATION  12/18/2006   RADIOACTIVE SEED GUIDED EXCISIONAL BREAST BIOPSY Right 05/30/2021   Procedure: RADIOACTIVE SEED GUIDED EXCISIONAL RIGHT BREAST BIOPSY;  Surgeon: Emelia Loron, MD;  Location: [facility];  Service: General;  Laterality: Right;   ROTATOR CUFF REPAIR Right 06/20/2007    Social History:  reports that she has never smoked. She has never been exposed to tobacco smoke. She has never used smokeless tobacco. She reports that she does not drink alcohol and does not use drugs. Family History:  Family History  Problem Relation Age of Onset   Diabetes Mother    Hyperlipidemia Mother    Thyroid disease Mother    Anxiety disorder Mother    Obesity Mother    Aortic stenosis Mother    Arthritis-Osteo Sister    Colon cancer Paternal Grandmother    Heart disease Paternal Grandfather    Pancreatic cancer Neg Hx    Stomach cancer Neg Hx    Liver disease Neg Hx    Esophageal cancer Neg Hx      HOME MEDICATIONS: Allergies as of 09/19/2023       Reactions   Tizanidine Hcl Other (See Comments)   Cant move Cant move Zanaflex - brand name   Clarithromycin Nausea Only   Severe stomach cramps Severe stomach cramps        Medication List        Accurate as of September 18, 2023  2:03 PM. If you have any questions, ask your nurse or doctor.          Breztri Aerosphere 160-9-4.8 MCG/ACT Aero Generic drug: budeson-glycopyrrolate-formoterol Inhale 2 puffs into the lungs in the morning and at bedtime.   COQ-10 PO Take by mouth.   folic acid 1 MG tablet Commonly known as: FOLVITE Take 2 tablets (2 mg  total) by mouth daily.   GARLIC PO Take by mouth.   lidocaine 5 % ointment Commonly known as: XYLOCAINE Apply 1 Application topically 3 (three) times daily. Use as needed.   MAGNESIUM PO Take by mouth.   metFORMIN 500 MG 24 hr tablet Commonly known as: GLUCOPHAGE-XR Take 2 tablets (1,000 mg total) by mouth daily before supper.   methotrexate 2.5 MG tablet Commonly known as: RHEUMATREX Take 8 tablets (20 mg total) by mouth once a week.   nystatin-triamcinolone ointment Commonly known as: MYCOLOG Apply 1 Application topically 2 (two) times daily. Use for a one week as needed.   omeprazole 20 MG capsule Commonly known as: PRILOSEC Take 1 capsule (20 mg total) by mouth daily.   ondansetron 4 MG tablet Commonly known as: ZOFRAN Take 1 tablet (  4 mg total) by mouth daily at 2 PM.   tirzepatide 7.5 MG/0.5ML Pen Commonly known as: MOUNJARO Inject 7.5 mg into the skin once a week.   Vibegron 75 MG Tabs Take 1 tablet (75 mg total) by mouth daily.         ALLERGIES: Allergies  Allergen Reactions   Tizanidine Hcl Other (See Comments)    Cant move Cant move Zanaflex - brand name   Clarithromycin Nausea Only    Severe stomach cramps Severe stomach cramps     REVIEW OF SYSTEMS: A comprehensive ROS was conducted with the patient and is negative except as per HPI    OBJECTIVE:   VITAL SIGNS: There were no vitals taken for this visit.   PHYSICAL EXAM:  General: Pt appears well and is in NAD  Neck: General: Supple without adenopathy or carotid bruits. Thyroid: Thyroid size normal.  Right  nodule appreciated.   Lungs: Clear with good BS bilat with no rales, rhonchi, or wheezes  Heart: RRR   Abdomen:  soft, nontender  Extremities:  Lower extremities - No pretibial edema.   Neuro: MS is good with appropriate affect, pt is alert and Ox3   Dm Foot Exam 03/21/2023  The skin of the feet is intact without sores or ulcerations. The pedal pulses are 2+ on right and 2+  on left. The sensation is intact to a screening 5.07, 10 gram monofilament bilaterally    DATA REVIEWED:  Lab Results  Component Value Date   HGBA1C 5.7 (A) 03/21/2023   HGBA1C 6.6 (A) 09/19/2022   HGBA1C 6.0 (A) 05/05/2022    Latest Reference Range & Units 02/01/23 10:22  Sodium 134 - 144 mmol/L 143  Potassium 3.5 - 5.2 mmol/L 5.4 (H)  Chloride 96 - 106 mmol/L 103  CO2 20 - 29 mmol/L 24  Glucose 70 - 99 mg/dL 97  BUN 6 - 24 mg/dL 9  Creatinine 7.84 - 6.96 mg/dL 2.95  Calcium 8.7 - 28.4 mg/dL 13.2  BUN/Creatinine Ratio 9 - 23  10  eGFR >59 mL/min/1.73 72  Alkaline Phosphatase 44 - 121 IU/L 90  Albumin 3.8 - 4.9 g/dL 4.8  AST 0 - 40 IU/L 24  ALT 0 - 32 IU/L 29  Total Protein 6.0 - 8.5 g/dL 7.2  Total Bilirubin 0.0 - 1.2 mg/dL 0.3   4/40/1027 TSH 2.53 uIU/mL   Thyroid Ultrasound 01/20/2022 Estimated total number of nodules >/= 1 cm: 2   Number of spongiform nodules >/=  2 cm not described below (TR1): 0   Number of mixed cystic and solid nodules >/= 1.5 cm not described below (TR2): 0   _________________________________________________________   Nodule labeled 1, inferior right thyroid, 2.8 cm. Nodule is unchanged and has undergone prior biopsy. Assuming benign result, no further specific follow-up would be indicated.   Nodule labeled 2 inferior left thyroid, 1.9 cm. This has TR 3 characteristics and meets criteria for surveillance.   No adenopathy   IMPRESSION: Left inferior thyroid nodule (labeled 2, 1.9 cm, TR 3) meets criteria for surveillance, as designated by the newly established ACR TI-RADS criteria. Surveillance ultrasound study recommended to be performed annually up to 5 years.   FNA right mid 07/07/2021 Clinical History: Rt. Mid thyroid nodule 2.5 x 2.8 x 2.1 cm  Specimen Submitted:  A. THYROID, RIGHT LOBE, FINE NEEDLE ASPIRATION:    FINAL MICROSCOPIC DIAGNOSIS:  - Atypia of undetermined significance (Bethesda category III)    ASSESSMENT /  PLAN / RECOMMENDATIONS:  1) Type 2 Diabetes Mellitus, Optimally controlled, Without complications - Most recent A1c of 5.7%. Goal A1c < 7.0 %.    -A1c remains at goal  -She is already on 5 mg of prednisone for sarcoidosis, she was recently started on corticotropin injections every 72 hours which has caused hyperglycemia as high as 290 Mg/DL, we discussed providing her with a prescription of NovoLog to be used based on correction scale as below For hyperglycemia -I will increase her Mounjaro -She did develop nausea with Ozempic -She developed recurrent genital infections with Jardiance -Dexcom prescription was sent, and a sample provided  MEDICATIONS: Take metformin 500 mg XR 2 tabs before supper Increase  Mounjaro 7.5 mg weekly  Start correction factor: NovoLog (BG -120/35) TIDQAC and QHS  EDUCATION / INSTRUCTIONS: BG monitoring instructions: Patient is instructed to check her blood sugars 1 times a day, fasting. Call [facility] if: BG persistently < 70  I reviewed the Rule of 15 for the treatment of hypoglycemia in detail with the patient. Literature supplied.   2) Diabetic complications:  Eye: Does not have known diabetic retinopathy.  Neuro/ Feet: Does not have known diabetic peripheral neuropathy. Renal: Patient does not have known baseline CKD.    3) MNG   - S/P FNA of the 2.8 cm right nodule with Atypia . Afirma benign per pt ( record not available )  -Repeating thyroid ultrasound showed stability 01/2022 -TFTs have been normal -Will proceed with a repeat ultrasound for this year     Follow-up in 6 months   Signed electronically by: Lyndle Herrlich, MD  [facility]  [facility] [address] [place], [place] [address] Phone: [phone] FAX: [phone]   CC: Hilbert Bible, FNP [address] [place] [address] Phone: [phone]  Fax:  [phone]    Return to Endocrinology clinic as below: Future Appointments  Date Time Provider Department Center  09/19/2023  8:30 AM Ahmari Garton, Konrad Dolores, MD [facility] None  09/24/2023 11:40 AM Selmer Dominion, NP [facility]  10/01/2023 11:40 AM Selmer Dominion, NP [facility]  10/08/2023 11:40 AM Selmer Dominion, NP [facility]  03/10/2024  9:30 AM Hilbert Bible, FNP [facility]  05/07/2024  9:30 AM Patton Salles, MD [facility] None

## 2023-09-19 ENCOUNTER — Ambulatory Visit: Payer: BC Managed Care – PPO | Admitting: Internal Medicine

## 2023-09-19 ENCOUNTER — Encounter: Payer: Self-pay | Admitting: Internal Medicine

## 2023-09-19 VITALS — BP 122/80 | HR 93 | Ht 61.0 in | Wt 180.0 lb

## 2023-09-19 DIAGNOSIS — Z7984 Long term (current) use of oral hypoglycemic drugs: Secondary | ICD-10-CM | POA: Diagnosis not present

## 2023-09-19 DIAGNOSIS — Z7985 Long-term (current) use of injectable non-insulin antidiabetic drugs: Secondary | ICD-10-CM | POA: Diagnosis not present

## 2023-09-19 DIAGNOSIS — E042 Nontoxic multinodular goiter: Secondary | ICD-10-CM

## 2023-09-19 DIAGNOSIS — E119 Type 2 diabetes mellitus without complications: Secondary | ICD-10-CM | POA: Diagnosis not present

## 2023-09-19 LAB — POCT GLYCOSYLATED HEMOGLOBIN (HGB A1C): Hemoglobin A1C: 5.1 % (ref 4.0–5.6)

## 2023-09-19 MED ORDER — METFORMIN HCL ER 500 MG PO TB24: 500.0000 mg | ORAL_TABLET | Freq: Every day | ORAL | 3 refills | Status: DC

## 2023-09-19 NOTE — Patient Instructions (Addendum)
 Continue  Mounjaro 7.5 mg weekly  Decrease Metformin 500 mg , 1 tablets before Supper     HOW TO TREAT LOW BLOOD SUGARS (Blood sugar LESS THAN 70 MG/DL) Please follow the RULE OF 15 for the treatment of hypoglycemia treatment (when your (blood sugars are less than 70 mg/dL)   STEP 1: Take 15 grams of carbohydrates when your blood sugar is low, which includes:  3-4 GLUCOSE TABS  OR 3-4 OZ OF JUICE OR REGULAR SODA OR ONE TUBE OF GLUCOSE GEL    STEP 2: RECHECK blood sugar in 15 MINUTES STEP 3: If your blood sugar is still low at the 15 minute recheck --> then, go back to STEP 1 and treat AGAIN with another 15 grams of carbohydrates.

## 2023-09-24 ENCOUNTER — Ambulatory Visit (INDEPENDENT_AMBULATORY_CARE_PROVIDER_SITE_OTHER): Admitting: Obstetrics and Gynecology

## 2023-09-24 ENCOUNTER — Encounter: Payer: Self-pay | Admitting: Obstetrics and Gynecology

## 2023-09-24 VITALS — BP 137/86 | HR 89

## 2023-09-24 DIAGNOSIS — R350 Frequency of micturition: Secondary | ICD-10-CM

## 2023-09-24 DIAGNOSIS — N3281 Overactive bladder: Secondary | ICD-10-CM | POA: Diagnosis not present

## 2023-09-24 NOTE — Progress Notes (Signed)
 [facility]  PTNS VISIT  CC:  Overactive bladder  58 y.o. with refractory overactive bladder who presents for percutaneous tibial nerve stimulation. The patient presents for PTNS session # 4.   Procedure: The patient was placed in the sitting position and the right lower extremity was prepped in the usual fashion. The PTNS needle was then inserted at a 60 degree angle, 5 cm cephalad and 2 cm posterior to the medial malleolus. The PTNS unit was then programmed and an optimal response was noted at 2 milliamps. The PTNS stimulation was then performed at this setting for 30 minutes without incident and the patient tolerated the procedure well. The needle was removed and hemostasis was noted.   The pt will return in 1 week for PTNS session # 5. All questions were answered.

## 2023-09-24 NOTE — Telephone Encounter (Signed)
 New referral placed.

## 2023-09-24 NOTE — Addendum Note (Signed)
 Addended by: Jerene Canny R on: 09/24/2023 01:22 PM   Modules accepted: Orders

## 2023-10-01 ENCOUNTER — Ambulatory Visit: Admitting: Obstetrics and Gynecology

## 2023-10-04 ENCOUNTER — Encounter (HOSPITAL_BASED_OUTPATIENT_CLINIC_OR_DEPARTMENT_OTHER): Payer: Self-pay | Admitting: Pulmonary Disease

## 2023-10-04 NOTE — Telephone Encounter (Signed)
 FYI

## 2023-10-08 ENCOUNTER — Encounter: Payer: Self-pay | Admitting: Obstetrics and Gynecology

## 2023-10-08 ENCOUNTER — Ambulatory Visit (INDEPENDENT_AMBULATORY_CARE_PROVIDER_SITE_OTHER): Admitting: Obstetrics and Gynecology

## 2023-10-08 ENCOUNTER — Other Ambulatory Visit: Payer: Self-pay

## 2023-10-08 VITALS — BP 109/76 | HR 89

## 2023-10-08 DIAGNOSIS — N3281 Overactive bladder: Secondary | ICD-10-CM

## 2023-10-08 DIAGNOSIS — R350 Frequency of micturition: Secondary | ICD-10-CM

## 2023-10-08 LAB — POCT URINALYSIS DIPSTICK
Bilirubin, UA: NEGATIVE
Blood, UA: NEGATIVE
Glucose, UA: NEGATIVE
Leukocytes, UA: NEGATIVE
Nitrite, UA: NEGATIVE
Protein, UA: NEGATIVE
Spec Grav, UA: 1.015 (ref 1.010–1.025)
Urobilinogen, UA: 0.2 U/dL
pH, UA: 8 (ref 5.0–8.0)

## 2023-10-08 MED ORDER — VIBEGRON 75 MG PO TABS
75.0000 mg | ORAL_TABLET | Freq: Every day | ORAL | 3 refills | Status: DC
Start: 2023-10-08 — End: 2024-02-04

## 2023-10-08 NOTE — Progress Notes (Signed)
 [facility]  PTNS VISIT  CC:  Overactive bladder  58 y.o. with refractory overactive bladder who presents for percutaneous tibial nerve stimulation. The patient presents for PTNS session # 5.   Procedure: The patient was placed in the sitting position and the left lower extremity was prepped in the usual fashion. The PTNS needle was then inserted at a 60 degree angle,  cm cephalad and 2 cm posterior to the medial malleolus. The PTNS unit was then programmed and an optimal response was noted at 3 milliamps. The PTNS stimulation was then performed at this setting for 30 minutes without incident and the patient tolerated the procedure well. The needle was removed and hemostasis was noted.   The pt will return in 1 week for PTNS session # 6. All questions were answered.

## 2023-10-15 ENCOUNTER — Encounter: Payer: Self-pay | Admitting: Obstetrics and Gynecology

## 2023-10-15 ENCOUNTER — Ambulatory Visit (INDEPENDENT_AMBULATORY_CARE_PROVIDER_SITE_OTHER): Admitting: Obstetrics and Gynecology

## 2023-10-15 ENCOUNTER — Other Ambulatory Visit (HOSPITAL_BASED_OUTPATIENT_CLINIC_OR_DEPARTMENT_OTHER): Payer: Self-pay | Admitting: Pulmonary Disease

## 2023-10-15 VITALS — BP 115/65 | HR 81

## 2023-10-15 DIAGNOSIS — N3281 Overactive bladder: Secondary | ICD-10-CM

## 2023-10-15 DIAGNOSIS — R350 Frequency of micturition: Secondary | ICD-10-CM | POA: Diagnosis not present

## 2023-10-15 LAB — CBC WITH DIFFERENTIAL/PLATELET
Basophils Absolute: 0.1 10*3/uL (ref 0.0–0.2)
Basos: 1 %
EOS (ABSOLUTE): 0.3 10*3/uL (ref 0.0–0.4)
Eos: 4 %
Hematocrit: 41.1 % (ref 34.0–46.6)
Hemoglobin: 13.6 g/dL (ref 11.1–15.9)
Immature Grans (Abs): 0 10*3/uL (ref 0.0–0.1)
Immature Granulocytes: 0 %
Lymphocytes Absolute: 1.3 10*3/uL (ref 0.7–3.1)
Lymphs: 16 %
MCH: 30.7 pg (ref 26.6–33.0)
MCHC: 33.1 g/dL (ref 31.5–35.7)
MCV: 93 fL (ref 79–97)
Monocytes Absolute: 0.8 10*3/uL (ref 0.1–0.9)
Monocytes: 10 %
Neutrophils Absolute: 5.6 10*3/uL (ref 1.4–7.0)
Neutrophils: 69 %
Platelets: 346 10*3/uL (ref 150–450)
RBC: 4.43 x10E6/uL (ref 3.77–5.28)
RDW: 14.6 % (ref 11.7–15.4)
WBC: 8.1 10*3/uL (ref 3.4–10.8)

## 2023-10-15 LAB — COMPREHENSIVE METABOLIC PANEL WITH GFR
ALT: 21 IU/L (ref 0–32)
AST: 25 IU/L (ref 0–40)
Albumin: 4.2 g/dL (ref 3.8–4.9)
Alkaline Phosphatase: 83 IU/L (ref 44–121)
BUN/Creatinine Ratio: 15 (ref 9–23)
BUN: 13 mg/dL (ref 6–24)
Bilirubin Total: 0.4 mg/dL (ref 0.0–1.2)
CO2: 23 mmol/L (ref 20–29)
Calcium: 9.5 mg/dL (ref 8.7–10.2)
Chloride: 104 mmol/L (ref 96–106)
Creatinine, Ser: 0.85 mg/dL (ref 0.57–1.00)
Globulin, Total: 2.1 g/dL (ref 1.5–4.5)
Glucose: 84 mg/dL (ref 70–99)
Potassium: 4.2 mmol/L (ref 3.5–5.2)
Sodium: 143 mmol/L (ref 134–144)
Total Protein: 6.3 g/dL (ref 6.0–8.5)
eGFR: 80 mL/min/{1.73_m2} (ref >59)

## 2023-10-15 NOTE — Progress Notes (Signed)
 [facility]  PTNS VISIT  CC:  Overactive bladder  58 y.o. with refractory overactive bladder who presents for percutaneous tibial nerve stimulation. The patient presents for PTNS session # 6. Patient is not seeing much improvement in symptoms and still has significant urgency and frequency.   Procedure: The patient was placed in the sitting position and the right lower extremity was prepped in the usual fashion. The PTNS needle was then inserted at a 60 degree angle, 5 cm cephalad and 2 cm posterior to the medial malleolus. The PTNS unit was then programmed and an optimal response was noted at 13 milliamps. The PTNS stimulation was then performed at this setting for 30 minutes without incident and the patient tolerated the procedure well. The needle was removed and hemostasis was noted.   The pt will return in 1 week for PTNS session # 7. All questions were answered.

## 2023-10-16 ENCOUNTER — Encounter: Payer: Self-pay | Admitting: Obstetrics and Gynecology

## 2023-10-17 NOTE — Progress Notes (Signed)
 Pt notified on VM of results

## 2023-10-22 ENCOUNTER — Ambulatory Visit: Admitting: Obstetrics and Gynecology

## 2023-10-23 ENCOUNTER — Ambulatory Visit: Admitting: Obstetrics and Gynecology

## 2023-10-29 ENCOUNTER — Ambulatory Visit: Admitting: Obstetrics and Gynecology

## 2023-11-05 ENCOUNTER — Ambulatory Visit: Admitting: Obstetrics and Gynecology

## 2023-11-13 ENCOUNTER — Ambulatory Visit: Admitting: Sports Medicine

## 2023-11-13 ENCOUNTER — Ambulatory Visit: Admitting: Obstetrics and Gynecology

## 2023-11-19 ENCOUNTER — Ambulatory Visit: Admitting: Obstetrics and Gynecology

## 2023-11-19 ENCOUNTER — Encounter: Payer: Self-pay | Admitting: Internal Medicine

## 2023-11-26 ENCOUNTER — Ambulatory Visit: Admitting: Obstetrics and Gynecology

## 2023-12-05 ENCOUNTER — Ambulatory Visit (INDEPENDENT_AMBULATORY_CARE_PROVIDER_SITE_OTHER)

## 2023-12-05 ENCOUNTER — Encounter (HOSPITAL_BASED_OUTPATIENT_CLINIC_OR_DEPARTMENT_OTHER): Payer: Self-pay | Admitting: Pulmonary Disease

## 2023-12-05 VITALS — BP 138/74 | HR 84

## 2023-12-05 DIAGNOSIS — R350 Frequency of micturition: Secondary | ICD-10-CM

## 2023-12-05 DIAGNOSIS — N3281 Overactive bladder: Secondary | ICD-10-CM

## 2023-12-05 NOTE — Progress Notes (Signed)
 [name] is a 58 y.o. female is here today for a self-cath teaching. I demonstrated on the pt using a mirror how to use a 35fr cath to drain urine from the bladder. Pt was able to demonstrate successfully using the catheter and verbalized understanding. Pt was given a self-cath bag with several catheters, lubricant, a mirror, measuring hat.

## 2023-12-05 NOTE — Patient Instructions (Signed)
 Please keep all scheduled follow ups.  It was a pleasure to see you today!  Thank you for trusting me with your care!

## 2023-12-06 NOTE — Telephone Encounter (Signed)
 Lets reach out to pharmacy team to see their recommendations

## 2023-12-06 NOTE — Telephone Encounter (Signed)
 Can you help with this per Tammy and recommendations?

## 2023-12-06 NOTE — Telephone Encounter (Signed)
**Note De-identified  [place] Obfuscation** Please advise 

## 2023-12-07 ENCOUNTER — Other Ambulatory Visit: Payer: Self-pay | Admitting: Student

## 2023-12-07 DIAGNOSIS — R569 Unspecified convulsions: Secondary | ICD-10-CM

## 2023-12-24 ENCOUNTER — Ambulatory Visit
Admission: RE | Admit: 2023-12-24 | Discharge: 2023-12-24 | Disposition: A | Discharge status: Home / Self Care | Source: Ambulatory Visit | Attending: Student | Admitting: Student

## 2023-12-24 DIAGNOSIS — R569 Unspecified convulsions: Secondary | ICD-10-CM

## 2023-12-24 MED ORDER — GADOPICLENOL 0.5 MMOL/ML IV SOLN
8.0000 mL | Freq: Once | INTRAVENOUS | Status: AC | PRN
  Administered 2023-12-24: 8 mL via INTRAVENOUS

## 2024-01-24 ENCOUNTER — Ambulatory Visit: Admitting: Obstetrics and Gynecology

## 2024-02-04 ENCOUNTER — Encounter (HOSPITAL_BASED_OUTPATIENT_CLINIC_OR_DEPARTMENT_OTHER): Payer: Self-pay | Admitting: Pulmonary Disease

## 2024-02-04 ENCOUNTER — Ambulatory Visit (HOSPITAL_BASED_OUTPATIENT_CLINIC_OR_DEPARTMENT_OTHER): Admitting: Pulmonary Disease

## 2024-02-04 VITALS — BP 114/76 | HR 76 | Ht 61.0 in | Wt 164.0 lb

## 2024-02-04 DIAGNOSIS — G4733 Obstructive sleep apnea (adult) (pediatric): Secondary | ICD-10-CM | POA: Diagnosis not present

## 2024-02-04 DIAGNOSIS — D869 Sarcoidosis, unspecified: Secondary | ICD-10-CM | POA: Diagnosis not present

## 2024-02-04 LAB — CBC WITH DIFFERENTIAL/PLATELET
Basophils Absolute: 0.1 x10E3/uL (ref 0.0–0.2)
Basos: 1 %
EOS (ABSOLUTE): 0.3 x10E3/uL (ref 0.0–0.4)
Eos: 4 %
Hematocrit: 40.6 % (ref 34.0–46.6)
Hemoglobin: 13.6 g/dL (ref 11.1–15.9)
Immature Grans (Abs): 0 x10E3/uL (ref 0.0–0.1)
Immature Granulocytes: 0 %
Lymphocytes Absolute: 1.3 x10E3/uL (ref 0.7–3.1)
Lymphs: 16 %
MCH: 32.8 pg (ref 26.6–33.0)
MCHC: 33.5 g/dL (ref 31.5–35.7)
MCV: 98 fL — ABNORMAL HIGH (ref 79–97)
Monocytes Absolute: 0.8 x10E3/uL (ref 0.1–0.9)
Monocytes: 10 %
Neutrophils Absolute: 5.6 x10E3/uL (ref 1.4–7.0)
Neutrophils: 69 %
Platelets: 352 x10E3/uL (ref 150–450)
RBC: 4.15 x10E6/uL (ref 3.77–5.28)
RDW: 14.4 % (ref 11.7–15.4)
WBC: 8.1 x10E3/uL (ref 3.4–10.8)

## 2024-02-04 LAB — COMPREHENSIVE METABOLIC PANEL WITH GFR
ALT: 18 IU/L (ref 0–32)
AST: 22 IU/L (ref 0–40)
Albumin: 4.3 g/dL (ref 3.8–4.9)
Alkaline Phosphatase: 91 IU/L (ref 44–121)
BUN/Creatinine Ratio: 15 (ref 9–23)
BUN: 12 mg/dL (ref 6–24)
Bilirubin Total: 0.4 mg/dL (ref 0.0–1.2)
CO2: 23 mmol/L (ref 20–29)
Calcium: 9.3 mg/dL (ref 8.7–10.2)
Chloride: 102 mmol/L (ref 96–106)
Creatinine, Ser: 0.79 mg/dL (ref 0.57–1.00)
Globulin, Total: 2.3 g/dL (ref 1.5–4.5)
Glucose: 88 mg/dL (ref 70–99)
Potassium: 4.5 mmol/L (ref 3.5–5.2)
Sodium: 141 mmol/L (ref 134–144)
Total Protein: 6.6 g/dL (ref 6.0–8.5)
eGFR: 87 mL/min/1.73 (ref >59)

## 2024-02-04 MED ORDER — METHOTREXATE SODIUM 2.5 MG PO TABS: 20.0000 mg | ORAL_TABLET | ORAL | 1 refills | Status: DC

## 2024-02-04 MED ORDER — ONDANSETRON HCL 4 MG PO TABS: 4.0000 mg | ORAL_TABLET | Freq: Every day | ORAL | 0 refills | Status: AC

## 2024-02-04 NOTE — Patient Instructions (Addendum)
 Moderate obstructive sleep apnea --ONO 2024 normal --HST 11/16/23 - AHI 22.25 --ORDER CPAP supplies: Auto CPAP 5-15 cm H20 --REFERRAL to ENT for [facility] evaluation  Sarcoidosis --Continue methotrexate  20 mg weekly with folic acid  2 mg daily. Currently has been on for 18 months  >>Reduce by 1 pill every 2 weeks until off.  Follow-up with me in 2 months

## 2024-02-04 NOTE — Progress Notes (Unsigned)
 Synopsis: Referred in April 2024 for abnormal PET scan, adenopathy by Knute Thersia Bitters, *  Subjective:   PATIENT ID: [name] GENDER: female DOB: 08/15/1965, MRN: [id number]  No chief complaint on file.  Synopsis: This is a 58 year old female, past medical history of back pain, constipation, type 2 diabetes, hypertension, kidney stones.  Presents with a new diagnosis of sarcoidosis after a inguinal node excision that shows not nonnecrotizing granulomas.  She has abnormal pet imaging that shows mediastinal and hilar adenopathy as well as splenic involvement. She reports that she has had difficulty concentrating, fatigue and brain fog for over a year.  She was started on prednisone  and methotrexate  by rheumatology.  She has had an eye exam and there is no evidence of uveitis.     11/27/22 She is a new patient to me to establish care for sarcoid management. She has been on methotrexate  20 mg weekly since 10/22/22. This was started by Rheumatology. Currently still on prednisone  10 mg, has been this medication since March 2024. Feels fatigued prior to her weekly dosing of methotrexate  on Sunday and then will feel worse after taking it on Mondays. She currently is followed by GI for belching and nausea and has had negative work-up. She continues to have shortness of breath with exertion. She reports brain fog and balance issues sometimes. Denies falling. Will need to hold onto things due to floating sensation at times.   02/01/23 Since our last visit she has been enrolled in Pulmonary rehab. She continues methotrexate  20 mg and folic acid  daily. She felt benefit with Breztri  ONE puff twice a day. Requesting refills because it helped with her shortness of breath and tolerating walking up steps easier. Denies cough or wheezing. Cough has nearly resolved. Did feel more fatigued when stepped down to prednisone  5 mg. Started taking advil  for general body aches and pains. Continues to have night  sweats.  05/21/23 She has been participating in Pulmonary rehab. She has shortness of breath with activity. No wheezing or coughing. Not using Breztri  as consistently. She is on methotrexate  20 mg weekly and folic acid  daily. Started Acthar  in 02/2023 however stopped in 03/2023 and headaches improved. Remains on prednisone  10 mg daily. Since our last visit she has had normal ONO.   08/16/23 She reports being off of prednisone  since end of January and feeling tired with achy joints in her hand. Does admit being sedentary at home. Goes to the gym five days a week but primary does weight for 45-60 min. Using Breztri  three times a week. Denies significant shortness of breath, cough or wheezing.  02/04/24 She continues to have fatigue and difficulty concentrating. 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 Arithmetic is also difficult for her. She feels frustrated and has noticed her memory and concentration is affecting her daily life including when cooking or performing errands as well. She is compliant with methotrexate  20 mg weekly and folic acid  daily. She had a sleep study completed in May that was positive for sleep apnea. She reports waking up at least once to go to the bathroom. Has previously tried CPAP in the past and unable to tolerate due to severe migraines/headaches. Husband reports occasional snoring, no witnessed apnea.   Past Medical History:  Diagnosis Date   Back pain    Constipation    Contact dermatitis 02/11/2021   Elevated LFTs 12/12/2018   Fatty liver    Fen-phen history 11/24/2018  Generalized anxiety disorder 09/25/2022   Hypercholesteremia    Hypertension    Intestinal bacterial overgrowth    Joint pain    Kidney stones    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   Lumbar pain 07/12/2021   Lymphadenopathy 06/13/2022   Migraines    Mild cognitive impairment 02/02/2023   Mixed hyperlipidemia 12/12/2018   Morbid obesity 11/24/2018    Myalgia, unspecified site 06/21/2021   Nausea in adult    Obstructive sleep apnea    mild, no CPAP   Other allergic rhinitis 04/04/2022   Pain in thoracic spine 07/18/2021   Sarcoidosis    Sleep disturbance 10/08/2019   Splenic lesion 05/20/2022   Subclinical hypothyroidism 09/2009   Type 2 diabetes mellitus without complication, without long-term current use of insulin  05/05/2022     Family History  Problem Relation Age of Onset   Diabetes Mother    Hyperlipidemia Mother    Thyroid  disease Mother    Anxiety disorder Mother    Obesity Mother    Aortic stenosis Mother    Arthritis-Osteo Sister    Colon cancer Paternal Grandmother    Heart disease Paternal Grandfather    Pancreatic cancer Neg Hx    Stomach cancer Neg Hx    Liver disease Neg Hx    Esophageal cancer Neg Hx      Past Surgical History:  Procedure Laterality Date   BIOPSY THYROID  Right 07/07/2021   FNA Right nodule benign   BLADDER SUSPENSION N/A 06/05/2022   Procedure: TRANSVAGINAL TAPE (TVT) PROCEDURE;  Surgeon: Marilynne Rosaline SAILOR, MD;  Location: [facility];  Service: Gynecology;  Laterality: N/A;  total time requested is 1 hour   COLONOSCOPY     CYSTOSCOPY N/A 06/05/2022   Procedure: CYSTOSCOPY;  Surgeon: Marilynne Rosaline SAILOR, MD;  Location[facility]  Service: Gynecology;  Laterality: N/A;   LITHOTRIPSY     LYMPH NODE DISSECTION     NOVASURE ABLATION  12/18/2006   RADIOACTIVE SEED GUIDED EXCISIONAL BREAST BIOPSY Right 05/30/2021   Procedure: RADIOACTIVE SEED GUIDED EXCISIONAL RIGHT BREAST BIOPSY;  Surgeon: Ebbie Cough, MD;  Location: [facility];  Service: General;  Laterality: Right;   ROTATOR CUFF REPAIR Right 06/20/2007    Social History   Socioeconomic History   Marital status: Married    Spouse name: Koren   Number of children: 2   Years of education: 16   Highest education level: Bachelor's degree (e.g., BA, AB, BS)  Occupational  History   Occupation: IT trainer  Tobacco Use   Smoking status: Never    Passive exposure: Never   Smokeless tobacco: Never  Vaping Use   Vaping status: Never Used  Substance and Sexual Activity   Alcohol use: No    Alcohol/week: 0.0 standard drinks of alcohol   Drug use: No   Sexual activity: Yes    Partners: Male    Birth control/protection: Surgical    Comment: Ablation, Vasectomy  Other Topics Concern   Not on file  Social History Narrative   Right handed   Drinks coffee   Lives with husband   Two story home   unemployed   Social Drivers of Health   Financial Resource Strain: Low Risk  (04/23/2023)   Overall Financial Resource Strain (CARDIA)    Difficulty of Paying Living Expenses: Not hard at all  Food Insecurity: No Food Insecurity (04/23/2023)   Hunger Vital Sign    Worried About Running Out of Food in the Last  Year: Never true    Ran Out of Food in the Last Year: Never true  Transportation Needs: No Transportation Needs (04/23/2023)   PRAPARE - Administrator, Civil Service (Medical): No    Lack of Transportation (Non-Medical): No  Physical Activity: Unknown (04/23/2023)   Exercise Vital Sign    Days of Exercise per Week: 3 days    Minutes of Exercise per Session: Not on file  Stress: No Stress Concern Present (04/23/2023)   [employer] of Occupational Health - Occupational Stress Questionnaire    Feeling of Stress : Not at all  Social Connections: Moderately Isolated (04/23/2023)   Social Connection and Isolation Panel    Frequency of Communication with Friends and Family: More than three times a week    Frequency of Social Gatherings with Friends and Family: Once a week    Attends Religious Services: Never    Database administrator or Organizations: No    Attends Engineer, structural: Not on file    Marital Status: Married  Intimate Partner Violence: Unknown (09/23/2021)   Received from [facility]   HITS    Physically Hurt: Not on file     Insult or Talk Down To: Not on file    Threaten Physical Harm: Not on file    Scream or Curse: Not on file     Allergies  Allergen Reactions   Tizanidine Hcl Other (See Comments)    Cant move Cant move Zanaflex - brand name   Clarithromycin Nausea Only    Severe stomach cramps Severe stomach cramps     Outpatient Medications Prior to Visit  Medication Sig Dispense Refill   BREZTRI  AEROSPHERE 160-9-4.8 MCG/ACT AERO Inhale 2 puffs into the lungs in the morning and at bedtime. 32.1 g 3   folic acid  (FOLVITE ) 1 MG tablet Take 2 tablets (2 mg total) by mouth daily. 180 tablet 11   GARLIC PO Take by mouth.     lidocaine  (XYLOCAINE ) 5 % ointment Apply 1 Application topically 3 (three) times daily. Use as needed. 1.25 g 2   Loteprednol Etabonate (EYSUVIS) 0.25 % SUSP      MAGNESIUM PO Take by mouth.     metFORMIN  (GLUCOPHAGE -XR) 500 MG 24 hr tablet Take 1 tablet (500 mg total) by mouth daily before supper. 90 tablet 3   methotrexate  (RHEUMATREX) 2.5 MG tablet Take 8 tablets (20 mg total) by mouth once a week. 96 tablet 2   nystatin -triamcinolone  ointment (MYCOLOG) Apply 1 Application topically 2 (two) times daily. Use for a one week as needed. 30 g 2   ondansetron  (ZOFRAN ) 4 MG tablet Take 1 tablet (4 mg total) by mouth daily at 2 PM. 30 tablet 0   tirzepatide  (MOUNJARO ) 7.5 MG/0.5ML Pen Inject 7.5 mg into the skin once a week. 6 mL 3   TYRVAYA 0.03 MG/ACT SOLN      Vibegron  75 MG TABS Take 1 tablet (75 mg total) by mouth daily. 90 tablet 3   No facility-administered medications prior to visit.    ROS   Objective:   There were no vitals filed for this visit.     on RA BMI Readings from Last 3 Encounters:  09/19/23 34.01 kg/m  09/03/23 35.90 kg/m  08/16/23 35.90 kg/m   Wt Readings from Last 3 Encounters:  09/19/23 180 lb (81.6 kg)  09/03/23 190 lb (86.2 kg)  08/16/23 190 lb (86.2 kg)   Physical Exam: General: Well-appearing, no acute distress HENT: [place],  AT Eyes:  EOMI, no scleral icterus Respiratory: ***Clear to auscultation bilaterally.  No crackles, wheezing or rales Cardiovascular: RRR, -M/R/G, no JVD Extremities:-Edema,-tenderness Neuro: AAO x4, CNII-XII grossly intact Psych: Normal mood, normal affect  Data Reviewed:  Labs:    Latest Ref Rng & Units 10/15/2023   11:03 AM 08/13/2023    8:26 AM 05/21/2023    9:36 AM  CBC  WBC 3.4 - 10.8 x10E3/uL 8.1  7.8  13.9   Hemoglobin 11.1 - 15.9 g/dL 86.3  86.1  85.9   Hematocrit 34.0 - 46.6 % 41.1  42.0  42.8   Platelets 150 - 450 x10E3/uL 346  442  425   Resolved leukocytosis     Latest Ref Rng & Units 10/15/2023   11:03 AM 08/13/2023    8:26 AM 05/21/2023    9:36 AM  CMP  Glucose 70 - 99 mg/dL 84  91  841   BUN 6 - 24 mg/dL 13  11  12    Creatinine 0.57 - 1.00 mg/dL 9.14  9.22  9.07   Sodium 134 - 144 mmol/L 143  142  140   Potassium 3.5 - 5.2 mmol/L 4.2  4.8  4.5   Chloride 96 - 106 mmol/L 104  100  100   CO2 20 - 29 mmol/L 23  24  22    Calcium 8.7 - 10.2 mg/dL 9.5  9.1  9.4   Total Protein 6.0 - 8.5 g/dL 6.3  6.7  6.7   Total Bilirubin 0.0 - 1.2 mg/dL 0.4  0.4  0.3   Alkaline Phos 44 - 121 IU/L 83  103  90   AST 0 - 40 IU/L 25  31  24    ALT 0 - 32 IU/L 21  29  22    Stable electrolytes, kidney function and liver  Chest Imaging:  05/24/2022 which reveals hypermetabolic adenopathy and splenic involvement consistent with sarcoid. CT Chest 09/13/22 - Stable 5 mm left upper lobe pulmonary nodule. Stable mediastinal and hilar lymph nodes. Overall normal parenchyma PET/CT 05/07/23 - Interval resolution of previous tracer avid lymphadenopathy within CAP and in liver and spleen.  Pulmonary Functions Testing Results:    Latest Ref Rng & Units 11/17/2022    8:44 AM  PFT Results  FVC-Pre L 2.41   FVC-Predicted Pre % 77   FVC-Post L 2.44   FVC-Predicted Post % 78   Pre FEV1/FVC % % 86   Post FEV1/FCV % % 87   FEV1-Pre L 2.07   FEV1-Predicted Pre % 85   FEV1-Post L 2.11   DLCO uncorrected  ml/min/mmHg 19.26   DLCO [facility]% % 102   DLCO corrected ml/min/mmHg 19.26   DLCO COR %Predicted % 102   DLVA Predicted % 118   TLC L 4.15   TLC % Predicted % 88   RV % Predicted % 96   11/17/22 - Interpretation: Normal PFTs. 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 Correlation with clinical and radiological findings is recommended.   Echocardiogram:  11/16/22 - Normal EF.      Assessment & Plan:   No diagnosis found.  Discussion: Patient has had evidence of sarcoid since 05/24/2022 with extensive hypermetabolic lymphadenopathy involving the chest, abdomen and pelvis and hepatic and diffuse splenic hypermetabolic disease. Her symptoms worsened to the point of needing immunosuppression starting in Feb 2024 initiated by her Rheumatologist. Sarcoids remains uncontrolled on methotrexate  20 mg. Trialed acthar  from  02/2023 to 03/2023 but discontinued due to headaches. PET/CT reviewed and improved hypermetabolic activity  WORKIN on disability  Sarcoid with deconditioning High risk medication management --Continue methotrexate  20 mg weekly with folic acid  2 mg daily. Currently has been on for 18 months  >>Reduce by 1 pill every 2 weeks until off.  >>Will follow-up in 2 months and discuss repeat PET/CT --Labs every 3-6 months: CBC with diff and CMET  --Off prednisone  since 06/2023 --OK to stop omeprazole  now off steroids --If joint pain persists despite over the counter ibuprofen  and tylenol , please contact your rheumatologist (Dr. Jeannetta) for appointment  Shortness of breath - improved --PFTs and echo normal --CONTINUE Breztri  ONE puff TWICE a day as needed  Moderate obstructive sleep apnea --ONO 2024 normal --HST 11/16/23 - AHI 22.25 --ORDER CPAP supplies: Auto CPAP 5-15 cm H20  Brain fog --Following [facility]  Nausea  --Zofran  PRN   Current Outpatient Medications:     BREZTRI  AEROSPHERE 160-9-4.8 MCG/ACT AERO, Inhale 2 puffs into the lungs in the morning and at bedtime., Disp: 32.1 g, Rfl: 3   folic acid  (FOLVITE ) 1 MG tablet, Take 2 tablets (2 mg total) by mouth daily., Disp: 180 tablet, Rfl: 11   GARLIC PO, Take by mouth., Disp: , Rfl:    lidocaine  (XYLOCAINE ) 5 % ointment, Apply 1 Application topically 3 (three) times daily. Use as needed., Disp: 1.25 g, Rfl: 2   Loteprednol Etabonate (EYSUVIS) 0.25 % SUSP, , Disp: , Rfl:    MAGNESIUM PO, Take by mouth., Disp: , Rfl:    metFORMIN  (GLUCOPHAGE -XR) 500 MG 24 hr tablet, Take 1 tablet (500 mg total) by mouth daily before supper., Disp: 90 tablet, Rfl: 3   methotrexate  (RHEUMATREX) 2.5 MG tablet, Take 8 tablets (20 mg total) by mouth once a week., Disp: 96 tablet, Rfl: 2   nystatin -triamcinolone  ointment (MYCOLOG), Apply 1 Application topically 2 (two) times daily. Use for a one week as needed., Disp: 30 g, Rfl: 2   ondansetron  (ZOFRAN ) 4 MG tablet, Take 1 tablet (4 mg total) by mouth daily at 2 PM., Disp: 30 tablet, Rfl: 0   tirzepatide  (MOUNJARO ) 7.5 MG/0.5ML Pen, Inject 7.5 mg into the skin once a week., Disp: 6 mL, Rfl: 3   TYRVAYA 0.03 MG/ACT SOLN, , Disp: , Rfl:    Vibegron  75 MG TABS, Take 1 tablet (75 mg total) by mouth daily., Disp: 90 tablet, Rfl: 3  I have spent a total time of***-minutes on the day of the appointment including chart review, data review, collecting history, coordinating care and discussing medical diagnosis and plan with the patient/family. Past medical history, allergies, medications were reviewed. Pertinent imaging, labs and tests included in this note have been reviewed and interpreted independently by me.  Brecken Dewoody Slater Staff, MD [facility] 02/04/2024 8:16 AM

## 2024-02-05 ENCOUNTER — Encounter (HOSPITAL_BASED_OUTPATIENT_CLINIC_OR_DEPARTMENT_OTHER): Payer: Self-pay | Admitting: Pulmonary Disease

## 2024-02-05 ENCOUNTER — Ambulatory Visit (HOSPITAL_BASED_OUTPATIENT_CLINIC_OR_DEPARTMENT_OTHER): Payer: Self-pay | Admitting: Pulmonary Disease

## 2024-02-05 NOTE — Progress Notes (Signed)
 Pt.notified

## 2024-02-07 ENCOUNTER — Other Ambulatory Visit: Payer: Self-pay | Admitting: Internal Medicine

## 2024-02-08 ENCOUNTER — Encounter (INDEPENDENT_AMBULATORY_CARE_PROVIDER_SITE_OTHER): Payer: Self-pay

## 2024-02-15 ENCOUNTER — Encounter (HOSPITAL_BASED_OUTPATIENT_CLINIC_OR_DEPARTMENT_OTHER): Payer: Self-pay | Admitting: Pulmonary Disease

## 2024-02-15 DIAGNOSIS — D869 Sarcoidosis, unspecified: Secondary | ICD-10-CM

## 2024-02-15 NOTE — Telephone Encounter (Signed)
 Is referral appropriate for me to place?

## 2024-02-15 NOTE — Telephone Encounter (Signed)
 OK for referral for patient's preference for rheumatology

## 2024-02-27 ENCOUNTER — Ambulatory Visit: Admitting: Obstetrics and Gynecology

## 2024-03-04 ENCOUNTER — Telehealth (HOSPITAL_BASED_OUTPATIENT_CLINIC_OR_DEPARTMENT_OTHER): Payer: Self-pay

## 2024-03-04 NOTE — Telephone Encounter (Signed)
 CMN received for CPAP supplies signed by provider and faxed confirmation received

## 2024-03-10 ENCOUNTER — Ambulatory Visit (HOSPITAL_BASED_OUTPATIENT_CLINIC_OR_DEPARTMENT_OTHER): Admitting: Family Medicine

## 2024-03-10 ENCOUNTER — Ambulatory Visit: Admitting: Internal Medicine

## 2024-03-17 LAB — OPHTHALMOLOGY REPORT-SCANNED

## 2024-03-20 ENCOUNTER — Ambulatory Visit: Admitting: Internal Medicine

## 2024-04-04 ENCOUNTER — Ambulatory Visit (HOSPITAL_BASED_OUTPATIENT_CLINIC_OR_DEPARTMENT_OTHER): Admitting: Pulmonary Disease

## 2024-04-04 ENCOUNTER — Encounter (HOSPITAL_BASED_OUTPATIENT_CLINIC_OR_DEPARTMENT_OTHER): Payer: Self-pay | Admitting: Pulmonary Disease

## 2024-04-04 VITALS — BP 137/84 | HR 74 | Wt 167.0 lb

## 2024-04-04 DIAGNOSIS — D869 Sarcoidosis, unspecified: Secondary | ICD-10-CM

## 2024-04-04 DIAGNOSIS — R591 Generalized enlarged lymph nodes: Secondary | ICD-10-CM | POA: Diagnosis not present

## 2024-04-04 NOTE — Progress Notes (Signed)
 Synopsis: Referred in April 2024 for abnormal PET scan, adenopathy by Knute Thersia Bitters, *  Subjective:   PATIENT ID: [name] GENDER: female DOB: 22-Oct-1965, MRN: [id number]  Chief Complaint  Patient presents with   Sarcoidosis    Follow up    Synopsis: Ms. [name] is a 58 year old female with sarcoid, type 2 diabetes, hypertension, kidney stones who presents for sarcoid follow-up. Sarcoid diagnosed with inguinal node excision that showed nonnecrotizing granulomas.  She has abnormal pet imaging that shows mediastinal and hilar adenopathy as well as splenic involvement. She reports that she has had difficulty concentrating, fatigue and brain fog for over a year.  She was started on prednisone  and methotrexate  by rheumatology.  She has had an eye exam and there is no evidence of uveitis.     11/27/22 She is a new patient to me to establish care for sarcoid management. She has been on methotrexate  20 mg weekly since 10/22/22. This was started by Rheumatology. Currently still on prednisone  10 mg, has been this medication since March 2024. Feels fatigued prior to her weekly dosing of methotrexate  on Sunday and then will feel worse after taking it on Mondays. She currently is followed by GI for belching and nausea and has had negative work-up. She continues to have shortness of breath with exertion. She reports brain fog and balance issues sometimes. Denies falling. Will need to hold onto things due to floating sensation at times.   02/01/23 Since our last visit she has been enrolled in Pulmonary rehab. She continues methotrexate  20 mg and folic acid  daily. She felt benefit with Breztri  ONE puff twice a day. Requesting refills because it helped with her shortness of breath and tolerating walking up steps easier. Denies cough or wheezing. Cough has nearly resolved. Did feel more fatigued when stepped down to prednisone  5 mg. Started taking advil  for general body aches and pains. Continues  to have night sweats.  05/21/23 She has been participating in Pulmonary rehab. She has shortness of breath with activity. No wheezing or coughing. Not using Breztri  as consistently. She is on methotrexate  20 mg weekly and folic acid  daily. Started Acthar  in 02/2023 however stopped in 03/2023 and headaches improved. Remains on prednisone  10 mg daily. Since our last visit she has had normal ONO.   08/16/23 She reports being off of prednisone  since end of January and feeling tired with achy joints in her hand. Does admit being sedentary at home. Goes to the gym five days a week but primary does weight for 45-60 min. Using Breztri  three times a week. Denies significant shortness of breath, cough or wheezing.  02/04/24 She continues to have fatigue and difficulty concentrating. 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 Arithmetic is also difficult for her. She feels frustrated and has noticed her memory and concentration is affecting her daily life including when cooking or performing errands as well. She is compliant with methotrexate  20 mg weekly and folic acid  daily. She had a sleep study completed in May that was positive for sleep apnea. Has poor sleep quality. She reports waking up at least once to go to the bathroom. Has previously tried CPAP in the past and unable to tolerate due to severe migraines/headaches. Husband reports occasional snoring, no witnessed apnea. Denies morning headaches. She is currently being followed by [facility] for neuropathy and also recently started on Vimpat for new diagnosis of seizures. Denies shortness of breath,  chest pain, cough or wheezing.  04/04/24 Since our last visit she is on methotrexate  and has been titrating off of it as we discussed. Currently on 4 pills weekly and folic acid  2 mg daily. Will decrease to 3 pills this Sunday. Has received her CPAP supplies and been using it since 02/21/24. Wearing CPAP nightly for >4  hours. She reports unchanged sleep quality. Unsure if frequency of headaches has changed since starting. Her concentration is unchanged and fatigue is unchanged.   Past Medical History:  Diagnosis Date   Back pain    Constipation    Contact dermatitis 02/11/2021   Elevated LFTs 12/12/2018   Fatty liver    Fen-phen history 11/24/2018   Generalized anxiety disorder 09/25/2022   Hypercholesteremia    Hypertension    Intestinal bacterial overgrowth    Joint pain    Kidney stones    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   Lumbar pain 07/12/2021   Lymphadenopathy 06/13/2022   Migraines    Mild cognitive impairment 02/02/2023   Mixed hyperlipidemia 12/12/2018   Morbid obesity 11/24/2018   Myalgia, unspecified site 06/21/2021   Nausea in adult    Obstructive sleep apnea    mild, no CPAP   Other allergic rhinitis 04/04/2022   Pain in thoracic spine 07/18/2021   Sarcoidosis    Sleep disturbance 10/08/2019   Splenic lesion 05/20/2022   Subclinical hypothyroidism 09/2009   Type 2 diabetes mellitus without complication, without long-term current use of insulin  05/05/2022     Family History  Problem Relation Age of Onset   Diabetes Mother    Hyperlipidemia Mother    Thyroid  disease Mother    Anxiety disorder Mother    Obesity Mother    Aortic stenosis Mother    Arthritis-Osteo Sister    Colon cancer Paternal Grandmother    Heart disease Paternal Grandfather    Pancreatic cancer Neg Hx    Stomach cancer Neg Hx    Liver disease Neg Hx    Esophageal cancer Neg Hx      Past Surgical History:  Procedure Laterality Date   BIOPSY THYROID  Right 07/07/2021   FNA Right nodule benign   BLADDER SUSPENSION N/A 06/05/2022   Procedure: TRANSVAGINAL TAPE (TVT) PROCEDURE;  Surgeon: Marilynne Rosaline SAILOR, MD;  Location: [facility];  Service: Gynecology;  Laterality: N/A;  total time requested is 1 hour   COLONOSCOPY     CYSTOSCOPY N/A 06/05/2022   Procedure: CYSTOSCOPY;  Surgeon:  Marilynne Rosaline SAILOR, MD;  Location[facility];  Service: Gynecology;  Laterality: N/A;   LITHOTRIPSY     LYMPH NODE DISSECTION     NOVASURE ABLATION  12/18/2006   RADIOACTIVE SEED GUIDED EXCISIONAL BREAST BIOPSY Right 05/30/2021   Procedure: RADIOACTIVE SEED GUIDED EXCISIONAL RIGHT BREAST BIOPSY;  Surgeon: Ebbie Cough, MD;  Location: [facility];  Service: General;  Laterality: Right;   ROTATOR CUFF REPAIR Right 06/20/2007    Social History   Socioeconomic History   Marital status: Married    Spouse name: Koren   Number of children: 2   Years of education: 16   Highest education level: Bachelor's degree (e.g., BA, AB, BS)  Occupational History   Occupation: IT trainer  Tobacco Use   Smoking status: Never    Passive exposure: Never   Smokeless tobacco: Never  Vaping Use   Vaping status: Never Used  Substance and Sexual Activity   Alcohol use: No    Alcohol/week: 0.0 standard drinks  of alcohol   Drug use: No   Sexual activity: Yes    Partners: Male    Birth control/protection: Surgical    Comment: Ablation, Vasectomy  Other Topics Concern   Not on file  Social History Narrative   Right handed   Drinks coffee   Lives with husband   Two story home   unemployed   Social Drivers of Health   Financial Resource Strain: Low Risk  (04/23/2023)   Overall Financial Resource Strain (CARDIA)    Difficulty of Paying Living Expenses: Not hard at all  Food Insecurity: No Food Insecurity (04/23/2023)   Hunger Vital Sign    Worried About Running Out of Food in the Last Year: Never true    Ran Out of Food in the Last Year: Never true  Transportation Needs: No Transportation Needs (04/23/2023)   PRAPARE - Administrator, Civil Service (Medical): No    Lack of Transportation (Non-Medical): No  Physical Activity: Unknown (04/23/2023)   Exercise Vital Sign    Days of Exercise per Week: 3 days    Minutes of Exercise per Session: Not on file   Stress: No Stress Concern Present (04/23/2023)   [employer] of Occupational Health - Occupational Stress Questionnaire    Feeling of Stress : Not at all  Social Connections: Moderately Isolated (04/23/2023)   Social Connection and Isolation Panel    Frequency of Communication with Friends and Family: More than three times a week    Frequency of Social Gatherings with Friends and Family: Once a week    Attends Religious Services: Never    Database administrator or Organizations: No    Attends Engineer, structural: Not on file    Marital Status: Married  Intimate Partner Violence: Unknown (09/23/2021)   Received from [facility]   HITS    Physically Hurt: Not on file    Insult or Talk Down To: Not on file    Threaten Physical Harm: Not on file    Scream or Curse: Not on file     Allergies  Allergen Reactions   Tizanidine Hcl Other (See Comments)    Cant move Cant move Zanaflex - brand name   Clarithromycin Nausea Only    Severe stomach cramps Severe stomach cramps     Outpatient Medications Prior to Visit  Medication Sig Dispense Refill   BREZTRI  AEROSPHERE 160-9-4.8 MCG/ACT AERO Inhale 2 puffs into the lungs in the morning and at bedtime. 32.1 g 3   folic acid  (FOLVITE ) 1 MG tablet Take 2 tablets (2 mg total) by mouth daily. 180 tablet 11   GARLIC PO Take by mouth.     lidocaine  (XYLOCAINE ) 5 % ointment Apply 1 Application topically 3 (three) times daily. Use as needed. 1.25 g 2   MAGNESIUM PO Take by mouth.     metFORMIN  (GLUCOPHAGE -XR) 500 MG 24 hr tablet Take 1 tablet (500 mg total) by mouth daily before supper. 90 tablet 3   methotrexate  (RHEUMATREX) 2.5 MG tablet Take 8 tablets (20 mg total) by mouth once a week. 96 tablet 1   nystatin -triamcinolone  ointment (MYCOLOG) Apply 1 Application topically 2 (two) times daily. Use for a one week as needed. 30 g 2   ondansetron  (ZOFRAN ) 4 MG tablet Take 1 tablet (4 mg total) by mouth daily at 2 PM. 30 tablet 0    tirzepatide  (MOUNJARO ) 7.5 MG/0.5ML Pen INJECT 7.5 MG SUBCUTANEOUSLY WEEKLY 6 mL 1   TYRVAYA 0.03 MG/ACT SOLN  No facility-administered medications prior to visit.    Review of Systems  Constitutional:  Positive for malaise/fatigue. Negative for chills, diaphoresis, fever and weight loss.  HENT:  Negative for congestion.   Respiratory:  Negative for cough, hemoptysis, sputum production, shortness of breath and wheezing.   Cardiovascular:  Negative for chest pain, palpitations and leg swelling.     Objective:   Vitals:   04/04/24 0929  BP: 137/84  Pulse: 74  SpO2: 98%  Weight: 167 lb (75.8 kg)     98% on RA BMI Readings from Last 3 Encounters:  04/04/24 31.55 kg/m  02/04/24 30.99 kg/m  09/19/23 34.01 kg/m   Wt Readings from Last 3 Encounters:  04/04/24 167 lb (75.8 kg)  02/04/24 164 lb (74.4 kg)  09/19/23 180 lb (81.6 kg)   Physical Exam: General: Well-appearing, no acute distress HENT: [place], AT Eyes: EOMI, no scleral icterus Respiratory: Clear to auscultation bilaterally.  No crackles, wheezing or rales Cardiovascular: RRR, -M/R/G, no JVD Extremities:-Edema,-tenderness Neuro: AAO x4, CNII-XII grossly intact Psych: Normal mood, normal affect  Data Reviewed:  Labs:    Latest Ref Rng & Units 02/04/2024    9:26 AM 10/15/2023   11:03 AM 08/13/2023    8:26 AM  CBC  WBC 3.4 - 10.8 x10E3/uL 8.1  8.1  7.8   Hemoglobin 11.1 - 15.9 g/dL 86.3  86.3  86.1   Hematocrit 34.0 - 46.6 % 40.6  41.1  42.0   Platelets 150 - 450 x10E3/uL 352  346  442   Normal blood counts     Latest Ref Rng & Units 02/04/2024    9:26 AM 10/15/2023   11:03 AM 08/13/2023    8:26 AM  CMP  Glucose 70 - 99 mg/dL 88  84  91   BUN 6 - 24 mg/dL 12  13  11    Creatinine 0.57 - 1.00 mg/dL 9.20  9.14  9.22   Sodium 134 - 144 mmol/L 141  143  142   Potassium 3.5 - 5.2 mmol/L 4.5  4.2  4.8   Chloride 96 - 106 mmol/L 102  104  100   CO2 20 - 29 mmol/L 23  23  24    Calcium 8.7 - 10.2 mg/dL 9.3   9.5  9.1   Total Protein 6.0 - 8.5 g/dL 6.6  6.3  6.7   Total Bilirubin 0.0 - 1.2 mg/dL 0.4  0.4  0.4   Alkaline Phos 44 - 121 IU/L 91  83  103   AST 0 - 40 IU/L 22  25  31    ALT 0 - 32 IU/L 18  21  29    Stable electrolytes, liver and kidney function  Chest Imaging:  PET/CT 05/24/2022 which reveals hypermetabolic adenopathy and splenic involvement consistent with sarcoid. CT Chest 09/13/22 - Stable 5 mm left upper lobe pulmonary nodule. Stable mediastinal and hilar lymph nodes. Overall normal parenchyma PET/CT 05/07/23 - Interval resolution of previous tracer avid lymphadenopathy within CAP and in liver and spleen.  Pulmonary Functions Testing Results:    Latest Ref Rng & Units 11/17/2022    8:44 AM  PFT Results  FVC-Pre L 2.41   FVC-Predicted Pre % 77   FVC-Post L 2.44   FVC-Predicted Post % 78   Pre FEV1/FVC % % 86   Post FEV1/FCV % % 87   FEV1-Pre L 2.07   FEV1-Predicted Pre % 85   FEV1-Post L 2.11   DLCO uncorrected ml/min/mmHg 19.26   DLCO UNC% % 102  DLCO corrected ml/min/mmHg 19.26   DLCO COR %Predicted % 102   DLVA Predicted % 118   TLC L 4.15   TLC % Predicted % 88   RV % Predicted % 96   11/17/22 - Interpretation: Normal PFTs. No significant BD response  Pathology:  07/05/22 Final Pathologic Diagnosis  [facility]    LYMPH NODE, RIGHT GROIN; EXCISIONAL BIOPSY: Non-necrotizing granulomatous lymphadenitis. (See comment)    Electronically signed by Darus Lessie Salt, MD on 07/05/2022 at  2:19 PM   Comment  [facility]  Sections demonstrate a lymph node almost entirely replaced by multiple non-caseating granulomata.  Few residual node elements are seen at the periphery with follicles and germinal center formation present.  An atypical infiltrate is not seen.   Appropriately controlled stains are performed.  GMS and AFB stains are negative for organisms.   Concurrent flow cytometry did not identify any  monotypic B-cell or abnormal T-cell population (see [id number]).   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 Correlation with clinical and radiological findings is recommended.   Final interpretation performed at [facility], Department of Pathology, [facility] [address] [place], [place] [address]; CLIA# [id number].      Echocardiogram:  11/16/22 - Normal EF.   Sleep test 11/14/23 - AHI = 22.5 per hour   03/05/24-04/03/24 Usage days 30/30 days 100% >4 hours 29 days 97% Avg usage 7 hours 10 min Auto 5-15 cm H20 AHI 0.3    Assessment & Plan:   No diagnosis found.   Discussion: Patient has had evidence of sarcoid since 05/24/2022 with extensive hypermetabolic lymphadenopathy involving the chest, abdomen and pelvis and hepatic and diffuse splenic hypermetabolic disease.  Her symptoms worsened to the point of needing immunosuppression starting in February 2024 initiated by her rheumatologist.  Sarcoid remains uncontrolled on methotrexate  20 mg.  Had trialed Acthar  from 02/2023 to 03/2023 but discontinued due to headaches.  PET/CT 05/06/2024 with improved hypermetabolic activity on immunosuppression.  Her ongoing headaches and now recent diagnosis of seizure may potentially be related to methotrexate  however unclear if secondary to medication versus a primary etiology.  At this point she is completed greater than 18 months of immunosuppression with methotrexate  with good response so reasonable to continue titrating off to reduce risk of adverse events.  Will be off of methotrexate  by the end of November.  Will repeat PET/CT at end of December.  Due to her sarcoid, seizures, uncontrolled OSA and high risk medication use, her symptoms are prevent her from performing her prior work functions. She has marked disability in remembering, understanding, concentration and applying information. Agree with pursuing  disability due to her current condition. With ongoing treatment, our goal is to treat with improvement in the future. Will reassess in 6-12 months eligibility to return to work if symptoms improve/resolve.  Sarcoid with deconditioning High risk medication management --08/2022 Initiated on prednisone  --10/22/22 methotrexate  20 mg weekly and prednisone  10 mg --06/2023 Weaned off prednisone . Remains on methotrexate  20 mg weekly --02/05/24 Start weaning methotrexate  --Continue to wean methotrexate  1 pill every 2 weeks. Decrease folic acid  to 1 mg daily. Currently has been on for >18 months  >>Reduce by 1 pill every 2 weeks until off. Should be off meds ~December 2025.  >>ORDER restaging PET/CT for end of 05/2024 --Labs every 3-6 months: CBC with diff, CMET  Shortness of breath - resolved --PFTs and echo normal --CONTINUE Breztri  ONE  puff TWICE a day as needed  Moderate obstructive sleep apnea on CPAP --Home sleep study 11/16/23 - AHI 22.25 --Patient uses NIV for more than four hours nightly for at least 70% of nights during the last three months of usage. The patient has been using and benefiting from PAP use and will continue to benefit from therapy.  --CONTINUE autoCPAP 5-15 cm H20 --Scheduled for ENT for [facility] evaluation next week  Brain fog Seizures --Following [facility]. On Vimpat. No indication to dose adjust methotrexate   Nausea  --Zofran  PRN   Current Outpatient Medications:    BREZTRI  AEROSPHERE 160-9-4.8 MCG/ACT AERO, Inhale 2 puffs into the lungs in the morning and at bedtime., Disp: 32.1 g, Rfl: 3   folic acid  (FOLVITE ) 1 MG tablet, Take 2 tablets (2 mg total) by mouth daily., Disp: 180 tablet, Rfl: 11   GARLIC PO, Take by mouth., Disp: , Rfl:    lidocaine  (XYLOCAINE ) 5 % ointment, Apply 1 Application topically 3 (three) times daily. Use as needed., Disp: 1.25 g, Rfl: 2   MAGNESIUM PO, Take by mouth., Disp: , Rfl:    metFORMIN  (GLUCOPHAGE -XR) 500 MG 24 hr tablet, Take 1  tablet (500 mg total) by mouth daily before supper., Disp: 90 tablet, Rfl: 3   methotrexate  (RHEUMATREX) 2.5 MG tablet, Take 8 tablets (20 mg total) by mouth once a week., Disp: 96 tablet, Rfl: 1   nystatin -triamcinolone  ointment (MYCOLOG), Apply 1 Application topically 2 (two) times daily. Use for a one week as needed., Disp: 30 g, Rfl: 2   ondansetron  (ZOFRAN ) 4 MG tablet, Take 1 tablet (4 mg total) by mouth daily at 2 PM., Disp: 30 tablet, Rfl: 0   tirzepatide  (MOUNJARO ) 7.5 MG/0.5ML Pen, INJECT 7.5 MG SUBCUTANEOUSLY WEEKLY, Disp: 6 mL, Rfl: 1   TYRVAYA 0.03 MG/ACT SOLN, , Disp: , Rfl:   I have spent a total time of 30-minutes on the day of the appointment including chart review, data review, collecting history, coordinating care and discussing medical diagnosis and plan with the patient/family. Past medical history, allergies, medications were reviewed. Pertinent imaging, labs and tests included in this note have been reviewed and interpreted independently by me.  Micayla Brathwaite Slater Staff, MD [facility] 04/04/2024 9:33 AM

## 2024-04-04 NOTE — Telephone Encounter (Signed)
 Any advice on referral recommendations for [facility]?

## 2024-04-04 NOTE — Patient Instructions (Addendum)
 Sarcoid with deconditioning --Continue to wean methotrexate  1 pill every 2 weeks. Decrease folic acid  to 1 mg daily. Currently has been on for >18 months  >>Reduce by 1 pill every 2 weeks until off. Should be off meds ~December 2025.  >>ORDER restaging PET/CT for end of 05/2024  Moderate obstructive sleep apnea on CPAP --Home sleep study 11/16/23 - AHI 22.25 --Patient uses NIV for more than four hours nightly for at least 70% of nights during the last three months of usage. The patient has been using and benefiting from PAP use and will continue to benefit from therapy.  --CONTINUE autoCPAP 5-15 cm H20 --Scheduled for ENT for [facility] evaluation next week

## 2024-04-07 ENCOUNTER — Ambulatory Visit: Admitting: Internal Medicine

## 2024-04-09 ENCOUNTER — Encounter: Payer: Self-pay | Admitting: Internal Medicine

## 2024-04-09 ENCOUNTER — Institutional Professional Consult (permissible substitution) (INDEPENDENT_AMBULATORY_CARE_PROVIDER_SITE_OTHER): Admitting: Otolaryngology

## 2024-04-09 ENCOUNTER — Other Ambulatory Visit

## 2024-04-09 ENCOUNTER — Ambulatory Visit: Admitting: Internal Medicine

## 2024-04-09 VITALS — BP 130/80 | HR 79 | Ht 61.0 in | Wt 166.2 lb

## 2024-04-09 DIAGNOSIS — Z7985 Long-term (current) use of injectable non-insulin antidiabetic drugs: Secondary | ICD-10-CM | POA: Diagnosis not present

## 2024-04-09 DIAGNOSIS — E042 Nontoxic multinodular goiter: Secondary | ICD-10-CM

## 2024-04-09 DIAGNOSIS — E119 Type 2 diabetes mellitus without complications: Secondary | ICD-10-CM | POA: Diagnosis not present

## 2024-04-09 DIAGNOSIS — Z7984 Long term (current) use of oral hypoglycemic drugs: Secondary | ICD-10-CM | POA: Diagnosis not present

## 2024-04-09 LAB — POCT GLYCOSYLATED HEMOGLOBIN (HGB A1C): Hemoglobin A1C: 5 % (ref 4.0–5.6)

## 2024-04-09 MED ORDER — MOUNJARO 7.5 MG/0.5ML ~~LOC~~ SOAJ: 7.5000 mg | SUBCUTANEOUS | 3 refills | Status: AC

## 2024-04-09 NOTE — Patient Instructions (Signed)
 Continue  Mounjaro  7.5 mg weekly  Stop  Metformin  500 mg , 1 tablets before Supper     HOW TO TREAT LOW BLOOD SUGARS (Blood sugar LESS THAN 70 MG/DL) Please follow the RULE OF 15 for the treatment of hypoglycemia treatment (when your (blood sugars are less than 70 mg/dL)   STEP 1: Take 15 grams of carbohydrates when your blood sugar is low, which includes:  3-4 GLUCOSE TABS  OR 3-4 OZ OF JUICE OR REGULAR SODA OR ONE TUBE OF GLUCOSE GEL    STEP 2: RECHECK blood sugar in 15 MINUTES STEP 3: If your blood sugar is still low at the 15 minute recheck --> then, go back to STEP 1 and treat AGAIN with another 15 grams of carbohydrates.

## 2024-04-09 NOTE — Progress Notes (Signed)
 Name: [name]  MRN/ DOB: [id number], 1966-06-08   Age/ Sex: 58 y.o., female    PCP: Knute Thersia Bitters, FNP   Reason for Endocrinology Evaluation: Type 2 Diabetes Mellitus     Date of Initial Endocrinology Visit: 05/05/2022    PATIENT IDENTIFIER: [name] is a 58 y.o. female with a past medical history of OSA, DM and HTN, Sarcoidosis (Dx 2024). The patient presented for initial endocrinology clinic visit on 05/05/2022 for consultative assistance with her diabetes management.    HPI: [name] was    Diagnosed with DM 03/2021 Prior Medications tried/Intolerance: Metformin  started 01/2022 and Jardiance 03/2022 but this was discontinued due to recurrent genital infections 04/2022. Ozempic  -nausea  Hemoglobin A1c has ranged from 5.7% in 2018, peaking at 6.5% in 03/2022.  On her initial visit to our clinic she had an A1c 6.0%, she was on jardiance and Metformin , we stopped Jardiance due to recurrent genital infections    Started Mounjaro  09/2022  She was started on Novolog  per correction scale while on prednisone  for sarcoidosis 03/2023 . Was OFF prednisone  06/2023    She was off NovoLog  by April, 2025 with an A1c of 5.1%  Discontinue metformin  03/2024 with an A1c of 5.0%  THYROID  HISTORY: Patient was noted with multinodular goiter on thyroid  ultrasound 04/2021.  She is S/P FNA of the right mid 2.8 cm nodule with atypia of undetermined significance (Bethesda category III). Per pt Afirma was benign    Due to symptoms of dysphagia, barium swallow was performed which was normal 05/2022    SUBJECTIVE:   During the last visit (09/19/2023): A1c 5.1%      Today (04/09/24): [name] is here for a follow up on diabetes management and MNG .  She checks her blood sugars 1 times daily. The patient has not had hypoglycemic episodes since the last clinic visit.   She had a follow-up with [facility] neurology for brain fog and cognitive impairment, neuropsychological  testing suggested that she has mild cognitive impairment that is multifactorial.  Recent EEG showed right temporal epileptiform discharges, she was started on Vimpat, gabapentin was discontinued and she was started on Cymbalta She continues to follow-up with pulmonary for sarcoidosis, she is on methotrexate  and folic acid  She has been following up with urogynecology for overactive bladder   Off Prednisone  06/2023  Patient continues with weight loss, she has been on autoimmune diet Has chronic nausea , no vomiting, doesn't think its related to mounjaro   Started prednisone  for joint aches for ~ 12 days  No heartburn  Has occasional constipation - on Senokot  No local neck swelling  No palpitations  She does weight training  Has numbness of  left hand and feet    HOME DIABETES REGIMEN: Metformin  500 mg 1 tab daily with supper  Mounjaro  7.5 mg weekly    Statin: no ACE-I/ARB: yes Prior Diabetic Education: no  METER DOWNLOAD SUMMARY: 87-116 mg/dL    DIABETIC COMPLICATIONS: Microvascular complications:   Denies: CKD, retinopathy, neuropathy  Last eye exam: Completed 04/13/2023  Macrovascular complications:   Denies: CAD, PVD, CVA   PAST HISTORY: Past Medical History:  Past Medical History:  Diagnosis Date   Back pain    Constipation    Contact dermatitis 02/11/2021   Elevated LFTs 12/12/2018   Fatty liver    Fen-phen history 11/24/2018   Generalized anxiety disorder 09/25/2022   Hypercholesteremia    Hypertension    Intestinal bacterial overgrowth    Joint pain  Kidney stones    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   Lumbar pain 07/12/2021   Lymphadenopathy 06/13/2022   Migraines    Mild cognitive impairment 02/02/2023   Mixed hyperlipidemia 12/12/2018   Morbid obesity 11/24/2018   Myalgia, unspecified site 06/21/2021   Nausea in adult    Obstructive sleep apnea    mild, no CPAP   Other allergic rhinitis 04/04/2022   Pain in thoracic spine 07/18/2021    Sarcoidosis    Sleep disturbance 10/08/2019   Splenic lesion 05/20/2022   Subclinical hypothyroidism 09/2009   Type 2 diabetes mellitus without complication, without long-term current use of insulin  05/05/2022   Past Surgical History:  Past Surgical History:  Procedure Laterality Date   BIOPSY THYROID  Right 07/07/2021   FNA Right nodule benign   BLADDER SUSPENSION N/A 06/05/2022   Procedure: TRANSVAGINAL TAPE (TVT) PROCEDURE;  Surgeon: Marilynne Rosaline SAILOR, MD;  Location: [facility];  Service: Gynecology;  Laterality: N/A;  total time requested is 1 hour   COLONOSCOPY     CYSTOSCOPY N/A 06/05/2022   Procedure: CYSTOSCOPY;  Surgeon: Marilynne Rosaline SAILOR, MD;  [facility]: [facility];  Service: Gynecology;  Laterality: N/A;   LITHOTRIPSY     LYMPH NODE DISSECTION     NOVASURE ABLATION  12/18/2006   RADIOACTIVE SEED GUIDED EXCISIONAL BREAST BIOPSY Right 05/30/2021   Procedure: RADIOACTIVE SEED GUIDED EXCISIONAL RIGHT BREAST BIOPSY;  Surgeon: Ebbie Cough, MD;  Location: [facility];  Service: General;  Laterality: Right;   ROTATOR CUFF REPAIR Right 06/20/2007    Social History:  reports that she has never smoked. She has never been exposed to tobacco smoke. She has never used smokeless tobacco. She reports that she does not drink alcohol and does not use drugs. Family History:  Family History  Problem Relation Age of Onset   Diabetes Mother    Hyperlipidemia Mother    Thyroid  disease Mother    Anxiety disorder Mother    Obesity Mother    Aortic stenosis Mother    Arthritis-Osteo Sister    Colon cancer Paternal Grandmother    Heart disease Paternal Grandfather    Pancreatic cancer Neg Hx    Stomach cancer Neg Hx    Liver disease Neg Hx    Esophageal cancer Neg Hx      HOME MEDICATIONS: Allergies as of 04/09/2024       Reactions   Tizanidine Hcl Other (See Comments)   Cant move Cant move Zanaflex - brand name    Clarithromycin Nausea Only   Severe stomach cramps Severe stomach cramps        Medication List        Accurate as of April 09, 2024  9:04 AM. If you have any questions, ask your nurse or doctor.          Breztri  Aerosphere 160-9-4.8 MCG/ACT Aero inhaler Generic drug: budesonide-glycopyrrolate-formoterol Inhale 2 puffs into the lungs in the morning and at bedtime.   folic acid  1 MG tablet Commonly known as: FOLVITE  Take 2 tablets (2 mg total) by mouth daily.   GARLIC PO Take by mouth.   lidocaine  5 % ointment Commonly known as: XYLOCAINE  Apply 1 Application topically 3 (three) times daily. Use as needed.   MAGNESIUM PO Take by mouth.   metFORMIN  500 MG 24 hr tablet Commonly known as: GLUCOPHAGE -XR Take 1 tablet (500 mg total) by mouth daily before supper.   methotrexate  2.5 MG tablet Commonly known as: RHEUMATREX Take  8 tablets (20 mg total) by mouth once a week.   Mounjaro  7.5 MG/0.5ML Pen Generic drug: tirzepatide  INJECT 7.5 MG SUBCUTANEOUSLY WEEKLY   nystatin -triamcinolone  ointment Commonly known as: MYCOLOG Apply 1 Application topically 2 (two) times daily. Use for a one week as needed.   ondansetron  4 MG tablet Commonly known as: ZOFRAN  Take 1 tablet (4 mg total) by mouth daily at 2 PM.   Tyrvaya 0.03 MG/ACT Soln Generic drug: Varenicline Tartrate         ALLERGIES: Allergies  Allergen Reactions   Tizanidine Hcl Other (See Comments)    Cant move Cant move Zanaflex - brand name   Clarithromycin Nausea Only    Severe stomach cramps Severe stomach cramps     REVIEW OF SYSTEMS: A comprehensive ROS was conducted with the patient and is negative except as per HPI    OBJECTIVE:   VITAL SIGNS: BP 130/80 (BP Location: Left Arm, Patient Position: Sitting, Cuff Size: Normal)   Pulse 79   Ht 5' 1 (1.549 m)   Wt 166 lb 3.2 oz (75.4 kg)   SpO2 97%   BMI 31.40 kg/m      Filed Weights   04/09/24 0852  Weight: 166 lb 3.2 oz (75.4  kg)      PHYSICAL EXAM:  General: Pt appears well and is in NAD  Neck: General: Supple without adenopathy or carotid bruits. Thyroid :  Right thyroid  asymmetry has been noted  Lungs: Clear with good BS bilat   Heart: RRR   Abdomen:  soft, nontender  Extremities:  Lower extremities - No pretibial edema.   Neuro: MS is good with appropriate affect, pt is alert and Ox3   Dm Foot Exam 04/09/2024  The skin of the feet is intact without sores or ulcerations. The pedal pulses are 2+ on right and 2+ on left. The sensation is intact to a screening 5.07, 10 gram monofilament bilaterally    DATA REVIEWED:  Lab Results  Component Value Date   HGBA1C 5.0 04/09/2024   HGBA1C 5.1 09/19/2023   HGBA1C 5.7 (A) 03/21/2023     Latest Reference Range & Units 02/04/24 09:26  Comprehensive metabolic panel with GFR  Rpt  Sodium 134 - 144 mmol/L 141  Potassium 3.5 - 5.2 mmol/L 4.5  Chloride 96 - 106 mmol/L 102  CO2 20 - 29 mmol/L 23  Glucose 70 - 99 mg/dL 88  BUN 6 - 24 mg/dL 12  Creatinine 9.42 - 8.99 mg/dL 9.20  Calcium 8.7 - 89.7 mg/dL 9.3  BUN/Creatinine Ratio 9 - 23  15  eGFR >59 mL/min/1.73 87  Alkaline Phosphatase 44 - 121 IU/L 91  Albumin 3.8 - 4.9 g/dL 4.3  AST 0 - 40 IU/L 22  ALT 0 - 32 IU/L 18  Total Protein 6.0 - 8.5 g/dL 6.6  Total Bilirubin 0.0 - 1.2 mg/dL 0.4  Rpt: View report in Results Review for more information  Thyroid  Ultrasound 03/28/2023  Estimated total number of nodules >/= 1 cm: 2   Number of spongiform nodules >/=  2 cm not described below (TR1): 0   Number of mixed cystic and solid nodules >/= 1.5 cm not described below (TR2): 0   _________________________________________________________   Nodule 1: 3.2 x 2.5 x 2.3 cm right mid thyroid  nodule does not demonstrate significant interval change in size. Please correlate with prior FNA results from 07/07/2021.   _________________________________________________________   Nodule 2: 1.4 x 1.1 x 1.0 cm  solid isoechoic left inferior thyroid  nodule has  slightly decreased in size since prior examination where it measured 1.9 x 1.5 x 1.1 cm. This TI-RADS 3 nodule no longer meets criteria for FNA or imaging surveillance.   _________________________________________________________   No new thyroid  nodules are seen.   IMPRESSION: 1. Previously biopsied right mid thyroid  nodule is unchanged in size. Please correlate with prior FNA results from 07/07/2021. 2. Interval decrease in size of TI-RADS 3 left inferior thyroid  nodule, which no longer meets criteria for FNA or imaging follow-up.  FNA right mid 07/07/2021 Clinical History: Rt. Mid thyroid  nodule 2.5 x 2.8 x 2.1 cm  Specimen Submitted:  A. THYROID , RIGHT LOBE, FINE NEEDLE ASPIRATION:    FINAL MICROSCOPIC DIAGNOSIS:  - Atypia of undetermined significance (Bethesda category III)    AFIRMA benign   ASSESSMENT / PLAN / RECOMMENDATIONS:   1) Type 2 Diabetes Mellitus, Optimally controlled, Without complications - Most recent A1c of 5.1%. Goal A1c < 7.0 %.    -A1c remains optimal -She did develop nausea with Ozempic  -She developed recurrent genital infections with Jardiance -I recommended continuing current dose of Mounjaro , and discontinuing metformin  - MA/CR ratio pending  MEDICATIONS: Decrease  metformin  500 mg XR , 1 tab before supper Continue  Mounjaro  7.5 mg weekly   EDUCATION / INSTRUCTIONS: BG monitoring instructions: Patient is instructed to check her blood sugars 1 times a day, fasting. Call [facility] if: BG persistently < 70  I reviewed the Rule of 15 for the treatment of hypoglycemia in detail with the patient. Literature supplied.   2) Diabetic complications:  Eye: Does not have known diabetic retinopathy.  Neuro/ Feet: Does not have known diabetic peripheral neuropathy. Renal: Patient does not have known baseline CKD.    3) MNG  -No local neck symptoms -Patient is clinically euthyroid -  S/P FNA of the 2.8 cm right nodule with Atypia . Afirma benign per pt ( record not available )  - An order for repeat ultrasound has been placed    Follow-up in 6 months   Signed electronically by: Stefano Redgie Butts, MD  [facility] Endocrinology  [facility] [address] [place], [place] [address] Phone: [phone] FAX: [phone]   CC: Knute Thersia Bitters, FNP [address] [place] [address] Phone: [phone]  Fax: [phone]    Return to Endocrinology clinic as below: Future Appointments  Date Time Provider Department Center  05/01/2024  9:00 AM Marilynne Rosaline SAILOR, MD [facility]  05/06/2024 11:10 AM Knute Thersia Bitters, FNP [facility] [id number]  05/07/2024  9:30 AM Cathlyn JAYSON Nikki Bobie FORBES, MD [facility] None  05/20/2024 11:40 AM Marilynne Rosaline SAILOR, MD [facility]  06/09/2024 10:00 AM [facility] PET CT 1 [facility] [place]  07/10/2024 11:00 AM Kassie Acquanetta Bradley, MD [facility] [phone] [address]

## 2024-04-10 ENCOUNTER — Ambulatory Visit: Payer: Self-pay | Admitting: Internal Medicine

## 2024-04-10 LAB — MICROALBUMIN / CREATININE URINE RATIO
Creatinine, Urine: 30 mg/dL (ref 20–275)
Microalb, Ur: <0.2 mg/dL

## 2024-04-21 ENCOUNTER — Encounter (HOSPITAL_BASED_OUTPATIENT_CLINIC_OR_DEPARTMENT_OTHER): Payer: Self-pay | Admitting: Pulmonary Disease

## 2024-04-21 NOTE — Telephone Encounter (Signed)
**Note De-identified  [place] Obfuscation** Please advise 

## 2024-05-01 ENCOUNTER — Ambulatory Visit: Admitting: Obstetrics and Gynecology

## 2024-05-05 ENCOUNTER — Ambulatory Visit
Admission: RE | Admit: 2024-05-05 | Discharge: 2024-05-05 | Disposition: A | Discharge status: Home / Self Care | Source: Ambulatory Visit | Attending: Internal Medicine | Admitting: Internal Medicine

## 2024-05-05 ENCOUNTER — Encounter (HOSPITAL_BASED_OUTPATIENT_CLINIC_OR_DEPARTMENT_OTHER): Payer: Self-pay | Admitting: Family Medicine

## 2024-05-05 DIAGNOSIS — E042 Nontoxic multinodular goiter: Secondary | ICD-10-CM

## 2024-05-05 DIAGNOSIS — E782 Mixed hyperlipidemia: Secondary | ICD-10-CM

## 2024-05-06 ENCOUNTER — Encounter (HOSPITAL_BASED_OUTPATIENT_CLINIC_OR_DEPARTMENT_OTHER): Admitting: Family Medicine

## 2024-05-06 NOTE — Progress Notes (Unsigned)
 58 y.o. G61P2002 Married Caucasian female here for annual exam.    PCP: Knute Thersia Bitters, FNP   No LMP recorded. Patient has had an ablation.           Sexually active: Yes.    The current method of family planning is vasectomy and ablation.    Menopausal hormone therapy:  n/a Exercising: {yes no:314532}  {types:19826} Smoker:  no  OB History  Gravida Para Term Preterm AB Living  2 2 2   2   SAB IAB Ectopic Multiple Live Births      2    # Outcome Date GA Lbr Len/2nd Weight Sex Type Anes PTL Lv  2 Term      Vag-Spont     1 Term      Vag-Vacuum        HEALTH MAINTENANCE: Last 2 paps:  05/03/23 neg HR HPV neg, 11/28/17 neg HPV neg History of abnormal Pap or positive HPV:  no Mammogram:   05/15/23 Breast Density Cat A, BIRADS Cat 1 neg  Colonoscopy:  04/13/21 Bone Density:  05/15/23   Result  osteopenia    Immunization History  Administered Date(s) Administered   PFIZER(Purple Top)SARS-COV-2 Vaccination 11/05/2019   Tdap 06/04/2007, 03/08/2018      reports that she has never smoked. She has never been exposed to tobacco smoke. She has never used smokeless tobacco. She reports that she does not drink alcohol and does not use drugs.  Past Medical History:  Diagnosis Date   Back pain    Constipation    Contact dermatitis 02/11/2021   Elevated LFTs 12/12/2018   Fatty liver    Fen-phen history 11/24/2018   Generalized anxiety disorder 09/25/2022   Hypercholesteremia    Hypertension    Intestinal bacterial overgrowth    Joint pain    Kidney stones    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   Lumbar pain 07/12/2021   Lymphadenopathy 06/13/2022   Migraines    Mild cognitive impairment 02/02/2023   Mixed hyperlipidemia 12/12/2018   Morbid obesity 11/24/2018   Myalgia, unspecified site 06/21/2021   Nausea in adult    Obstructive sleep apnea    mild, no CPAP   Other allergic rhinitis 04/04/2022   Pain in thoracic spine 07/18/2021   Sarcoidosis    Sleep disturbance  10/08/2019   Splenic lesion 05/20/2022   Subclinical hypothyroidism 09/2009   Type 2 diabetes mellitus without complication, without long-term current use of insulin  05/05/2022    Past Surgical History:  Procedure Laterality Date   BIOPSY THYROID  Right 07/07/2021   FNA Right nodule benign   BLADDER SUSPENSION N/A 06/05/2022   Procedure: TRANSVAGINAL TAPE (TVT) PROCEDURE;  Surgeon: Marilynne Rosaline SAILOR, MD;  Location[facility];  Service: Gynecology;  Laterality: N/A;  total time requested is 1 hour   COLONOSCOPY     CYSTOSCOPY N/A 06/05/2022   Procedure: CYSTOSCOPY;  Surgeon: Marilynne Rosaline SAILOR, MD;  Location: [facility];  Service: Gynecology;  Laterality: N/A;   LITHOTRIPSY     LYMPH NODE DISSECTION     NOVASURE ABLATION  12/18/2006   RADIOACTIVE SEED GUIDED EXCISIONAL BREAST BIOPSY Right 05/30/2021   Procedure: RADIOACTIVE SEED GUIDED EXCISIONAL RIGHT BREAST BIOPSY;  Surgeon: Ebbie Cough, MD;  Location: [facility];  Service: General;  Laterality: Right;   ROTATOR CUFF REPAIR Right 06/20/2007    Current Outpatient Medications  Medication Sig Dispense Refill   BREZTRI  AEROSPHERE 160-9-4.8 MCG/ACT AERO Inhale 2 puffs  into the lungs in the morning and at bedtime. 32.1 g 3   folic acid  (FOLVITE ) 1 MG tablet Take 2 tablets (2 mg total) by mouth daily. 180 tablet 11   GARLIC PO Take by mouth.     lidocaine  (XYLOCAINE ) 5 % ointment Apply 1 Application topically 3 (three) times daily. Use as needed. 1.25 g 2   MAGNESIUM PO Take by mouth.     methotrexate  (RHEUMATREX) 2.5 MG tablet Take 8 tablets (20 mg total) by mouth once a week. 96 tablet 1   nystatin -triamcinolone  ointment (MYCOLOG) Apply 1 Application topically 2 (two) times daily. Use for a one week as needed. 30 g 2   ondansetron  (ZOFRAN ) 4 MG tablet Take 1 tablet (4 mg total) by mouth daily at 2 PM. 30 tablet 0   tirzepatide  (MOUNJARO ) 7.5 MG/0.5ML Pen Inject 7.5 mg into the  skin once a week. 6 mL 3   TYRVAYA 0.03 MG/ACT SOLN      No current facility-administered medications for this visit.    ALLERGIES: Tizanidine hcl and Clarithromycin  Family History  Problem Relation Age of Onset   Diabetes Mother    Hyperlipidemia Mother    Thyroid  disease Mother    Anxiety disorder Mother    Obesity Mother    Aortic stenosis Mother    Arthritis-Osteo Sister    Colon cancer Paternal Grandmother    Heart disease Paternal Grandfather    Pancreatic cancer Neg Hx    Stomach cancer Neg Hx    Liver disease Neg Hx    Esophageal cancer Neg Hx     Review of Systems  PHYSICAL EXAM:  There were no vitals taken for this visit.    General appearance: alert, cooperative and appears stated age Head: normocephalic, without obvious abnormality, atraumatic Neck: no adenopathy, supple, symmetrical, trachea midline and thyroid  normal to inspection and palpation Lungs: clear to auscultation bilaterally Breasts: normal appearance, no masses or tenderness, No nipple retraction or dimpling, No nipple discharge or bleeding, No axillary adenopathy Heart: regular rate and rhythm Abdomen: soft, non-tender; no masses, no organomegaly Extremities: extremities normal, atraumatic, no cyanosis or edema Skin: skin color, texture, turgor normal. No rashes or lesions Lymph nodes: cervical, supraclavicular, and axillary nodes normal. Neurologic: grossly normal  Pelvic: External genitalia:  no lesions              No abnormal inguinal nodes palpated.              Urethra:  normal appearing urethra with no masses, tenderness or lesions              Bartholins and Skenes: normal                 Vagina: normal appearing vagina with normal color and discharge, no lesions              Cervix: no lesions              Pap taken: {yes no:314532} Bimanual Exam:  Uterus:  normal size, contour, position, consistency, mobility, non-tender              Adnexa: no mass, fullness, tenderness               Rectal exam: {yes no:314532}.  Confirms.              Anus:  normal sphincter tone, no lesions  Chaperone was present for exam:  {BSCHAPERONE:31226::Emily F, CMA}  ASSESSMENT: Well woman visit with gynecologic  exam.  PHQ-2-9: ***  ***  PLAN: Mammogram screening discussed. Self breast awareness reviewed. Pap and HRV collected:  {yes no:314532} Guidelines for Calcium, Vitamin D , regular exercise program including cardiovascular and weight bearing exercise. Medication refills:  *** {LABS (Optional):23779} Follow up:  ***    Additional counseling given.  {yes [id number]. ***  total time was spent for this patient encounter, including preparation, face-to-face counseling with the patient, coordination of care, and documentation of the encounter in addition to doing the well woman visit with gynecologic exam.

## 2024-05-07 ENCOUNTER — Encounter: Payer: Self-pay | Admitting: Obstetrics and Gynecology

## 2024-05-07 ENCOUNTER — Ambulatory Visit (INDEPENDENT_AMBULATORY_CARE_PROVIDER_SITE_OTHER): Payer: BC Managed Care – PPO | Admitting: Obstetrics and Gynecology

## 2024-05-07 ENCOUNTER — Other Ambulatory Visit: Payer: Self-pay | Admitting: Student

## 2024-05-07 VITALS — BP 124/84 | HR 89 | Ht 61.5 in | Wt 169.0 lb

## 2024-05-07 DIAGNOSIS — Z01419 Encounter for gynecological examination (general) (routine) without abnormal findings: Secondary | ICD-10-CM | POA: Diagnosis not present

## 2024-05-07 DIAGNOSIS — Z1331 Encounter for screening for depression: Secondary | ICD-10-CM | POA: Diagnosis not present

## 2024-05-07 DIAGNOSIS — G35D Multiple sclerosis, unspecified: Secondary | ICD-10-CM

## 2024-05-07 MED ORDER — VALACYCLOVIR HCL 1 G PO TABS: ORAL_TABLET | ORAL | 2 refills | Status: AC

## 2024-05-07 MED ORDER — ESTRADIOL 0.01 % VA CREA: TOPICAL_CREAM | VAGINAL | 2 refills | Status: AC

## 2024-05-07 MED ORDER — LIDOCAINE 5 % EX OINT: 1.0000 | TOPICAL_OINTMENT | Freq: Three times a day (TID) | CUTANEOUS | 2 refills | Status: AC

## 2024-05-07 MED ORDER — NYSTATIN-TRIAMCINOLONE 100000-0.1 UNIT/GM-% EX OINT: 1.0000 | TOPICAL_OINTMENT | Freq: Two times a day (BID) | CUTANEOUS | 2 refills | Status: AC

## 2024-05-07 NOTE — Patient Instructions (Signed)

## 2024-05-08 ENCOUNTER — Ambulatory Visit (INDEPENDENT_AMBULATORY_CARE_PROVIDER_SITE_OTHER): Admitting: Pulmonary Disease

## 2024-05-08 ENCOUNTER — Encounter: Payer: Self-pay | Admitting: Oncology

## 2024-05-08 ENCOUNTER — Encounter (HOSPITAL_BASED_OUTPATIENT_CLINIC_OR_DEPARTMENT_OTHER): Payer: Self-pay | Admitting: Pulmonary Disease

## 2024-05-08 VITALS — BP 131/87 | HR 75 | Temp 98.8°F | Ht 61.5 in | Wt 169.8 lb

## 2024-05-08 DIAGNOSIS — G4733 Obstructive sleep apnea (adult) (pediatric): Secondary | ICD-10-CM

## 2024-05-08 NOTE — Patient Instructions (Addendum)
 X home sleep test to reassess   VISIT SUMMARY: Today, we discussed your sleep apnea and the potential use of the Inspire device. You have been using a CPAP machine but find it uncomfortable and still experience fatigue. We reviewed your significant weight loss and its impact on your condition. We also considered alternative treatments and next steps.  YOUR PLAN: -OBSTRUCTIVE SLEEP APNEA: Obstructive sleep apnea is a condition where your airway becomes blocked during sleep, causing breathing pauses. Your initial sleep study showed moderate sleep apnea, but your significant weight loss has likely improved your condition. Your CPAP machine is effectively reducing apnea events, but you still feel fatigued. We will conduct a repeat home sleep study to reassess your condition and adjust your CPAP settings to a pressure range of 5-10 cm H2O. If CPAP continues to be uncomfortable or ineffective, we may consider a dental appliance as an alternative.  INSTRUCTIONS: Please complete the repeat home sleep study as ordered. We will reassess your treatment plan based on the results. Continue using your CPAP machine with the adjusted settings and maintain your active lifestyle.                      Contains text generated by [employer].                                 Contains text generated by [employer]bridge.

## 2024-05-08 NOTE — Progress Notes (Signed)
 New Patient Pulmonology Office Visit   Subjective:  Patient ID: [name], female    DOB: 1965-10-24  MRN: [id number]  Referred by: [name], *  CC:  Chief Complaint  Patient presents with   Consult   Sleep Apnea    Discuss inspire   New pt for eval of OSA , for inspire , has seen dr [name] for sarcoid Sarcoid diagnosed with inguinal node excision that showed nonnecrotizing granulomas.   Has previously tried CPAP in the past and unable to tolerate due to severe migraines/headaches.   PMH : type 2 diabetes, hypertension, kidney stones    Discussed the use of AI scribe software for clinical note transcription with the patient, who gave verbal consent to proceed.  History of Present Illness [name] is a 58 year old female with sleep apnea who presents for evaluation of the Inspire device. She is accompanied by her [name]. She was referred by [name] for evaluation of her sleep apnea and consideration of the [facility] device.  [name] has been using a CPAP machine since September but finds it uncomfortable, particularly on her nose. Despite its use, she experiences fatigue and poor sleep quality, often waking at 3 AM and unable to return to sleep. A sleep study in May showed 20 apneic events per hour. Since losing 40 pounds through an autoimmune diet, her CPAP machine records 0.2 to 0.3 events per hour.  She has sarcoidosis and previously used methotrexate  and prednisone , which she has discontinued. She experiences joint pain and fatigue, which she attributes to sarcoidosis. Her weight loss, from nearly 200 pounds to 169 pounds, was aided by Mounjaro , initially prescribed due to prednisone -induced weight gain. She has stopped metformin  as her A1c levels have improved.  [name] maintain an active lifestyle, going to the gym every morning, which she feels helps with her energy levels.     Significant tests/ events  reviewed  11/14/23 - AHI = 22.5 per hour   ROS  Fatigue + Lost 40 lbs  Allergies: Tizanidine hcl and Clarithromycin  Current Outpatient Medications:    BREZTRI  AEROSPHERE 160-9-4.8 MCG/ACT AERO, Inhale 2 puffs into the lungs in the morning and at bedtime., Disp: 32.1 g, Rfl: 3   DULoxetine (CYMBALTA) 30 MG capsule, Take by mouth daily., Disp: , Rfl:    estradiol  (ESTRACE ) 0.01 % CREA vaginal cream, Place 1/2 gram in the vagina every night for two weeks, and then place 1/2 gram in the vagina twice a week at bedtime., Disp: 42.5 g, Rfl: 2   GARLIC PO, Take by mouth., Disp: , Rfl:    Lacosamide 100 MG TABS, Take 2 tablets by mouth 2 (two) times daily., Disp: , Rfl:    lidocaine  (XYLOCAINE ) 5 % ointment, Apply 1 Application topically 3 (three) times daily. Use as needed., Disp: 1.25 g, Rfl: 2   nystatin -triamcinolone  ointment (MYCOLOG), Apply 1 Application topically 2 (two) times daily. Use for a one week as needed., Disp: 30 g, Rfl: 2   ondansetron  (ZOFRAN ) 4 MG tablet, Take 1 tablet (4 mg total) by mouth daily at 2 PM., Disp: 30 tablet, Rfl: 0   tirzepatide  (MOUNJARO ) 7.5 MG/0.5ML Pen, Inject 7.5 mg into the skin once a week., Disp: 6 mL, Rfl: 3   TRYPTYR 0.003 % SOLN, Apply 1 drop to eye., Disp: , Rfl:    TYRVAYA 0.03 MG/ACT SOLN, , Disp: , Rfl:    valACYclovir  (VALTREX ) 1000 MG tablet, Take 2  tablet (2000 mg) by mouth twice a day for one day as needed., Disp: 30 tablet, Rfl: 2   folic acid  (FOLVITE ) 1 MG tablet, Take 2 tablets (2 mg total) by mouth daily. (Patient not taking: Reported on 05/08/2024), Disp: 180 tablet, Rfl: 11   MAGNESIUM PO, Take by mouth. (Patient not taking: Reported on 05/08/2024), Disp: , Rfl:    methotrexate  (RHEUMATREX) 2.5 MG tablet, Take 8 tablets (20 mg total) by mouth once a week. (Patient not taking: Reported on 05/08/2024), Disp: 96 tablet, Rfl: 1 Past Medical History:  Diagnosis Date   Back pain    Constipation    Contact dermatitis 02/11/2021   Elevated  LFTs 12/12/2018   Fatty liver    Fen-phen history 11/24/2018   Generalized anxiety disorder 09/25/2022   Hypercholesteremia    Hypertension    Intestinal bacterial overgrowth    Joint pain    Kidney stones    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   Lumbar pain 07/12/2021   Lymphadenopathy 06/13/2022   Migraines    Mild cognitive impairment 02/02/2023   Mixed hyperlipidemia 12/12/2018   Morbid obesity 11/24/2018   Myalgia, unspecified site 06/21/2021   Nausea in adult    Obstructive sleep apnea    mild, no CPAP   Other allergic rhinitis 04/04/2022   Pain in thoracic spine 07/18/2021   Sarcoidosis    Sleep disturbance 10/08/2019   Splenic lesion 05/20/2022   Subclinical hypothyroidism 09/2009   Type 2 diabetes mellitus without complication, without long-term current use of insulin  05/05/2022   Past Surgical History:  Procedure Laterality Date   BIOPSY THYROID  Right 07/07/2021   FNA Right nodule benign   BLADDER SUSPENSION N/A 06/05/2022   Procedure: TRANSVAGINAL TAPE (TVT) PROCEDURE;  Surgeon: Taylor Rosaline SAILOR, MD;  Location: [facility];  Service: Gynecology;  Laterality: N/A;  total time requested is 1 hour   COLONOSCOPY     CYSTOSCOPY N/A 06/05/2022   Procedure: CYSTOSCOPY;  Surgeon: Taylor Rosaline SAILOR, MD;  Location: [facility];  Service: Gynecology;  Laterality: N/A;   LITHOTRIPSY     LYMPH NODE DISSECTION     NOVASURE ABLATION  12/18/2006   RADIOACTIVE SEED GUIDED EXCISIONAL BREAST BIOPSY Right 05/30/2021   Procedure: RADIOACTIVE SEED GUIDED EXCISIONAL RIGHT BREAST BIOPSY;  Surgeon: Taylor Cough, MD;  Location: [facility];  Service: General;  Laterality: Right;   ROTATOR CUFF REPAIR Right 06/20/2007   Family History  Problem Relation Age of Onset   Diabetes Mother    Hyperlipidemia Mother    Thyroid  disease Mother    Anxiety disorder Mother    Obesity Mother    Aortic stenosis Mother    Arthritis-Osteo Sister     Colon cancer Paternal Grandmother    Heart disease Paternal Grandfather    Pancreatic cancer Neg Hx    Stomach cancer Neg Hx    Liver disease Neg Hx    Esophageal cancer Neg Hx    Social History   Socioeconomic History   Marital status: Married    Spouse name: Taylor   Number of children: 2   Years of education: 16   Highest education level: Bachelor's degree (e.g., BA, AB, BS)  Occupational History   Occupation: IT TRAINER  Tobacco Use   Smoking status: Never    Passive exposure: Never   Smokeless tobacco: Never  Vaping Use   Vaping status: Never Used  Substance and Sexual Activity   Alcohol use: No    Alcohol/week:  0.0 standard drinks of alcohol   Drug use: No   Sexual activity: Yes    Partners: Male    Birth control/protection: Surgical    Comment: Ablation, Vasectomy  Other Topics Concern   Not on file  Social History Narrative   Right handed   Drinks coffee   Lives with [name]   Two story home   unemployed   Social Drivers of Health   Financial Resource Strain: Low Risk  (04/23/2023)   Overall Financial Resource Strain (CARDIA)    Difficulty of Paying Living Expenses: Not hard at all  Food Insecurity: No Food Insecurity (04/23/2023)   Hunger Vital Sign    Worried About Running Out of Food in the Last Year: Never true    Ran Out of Food in the Last Year: Never true  Transportation Needs: No Transportation Needs (04/23/2023)   PRAPARE - Administrator, Civil Service (Medical): No    Lack of Transportation (Non-Medical): No  Physical Activity: Unknown (04/23/2023)   Exercise Vital Sign    Days of Exercise per Week: 3 days    Minutes of Exercise per Session: Not on file  Stress: No Stress Concern Present (04/23/2023)   [employer] of Occupational Health - Occupational Stress Questionnaire    Feeling of Stress : Not at all  Social Connections: Moderately Isolated (04/23/2023)   Social Connection and Isolation Panel    Frequency of Communication with  Friends and Family: More than three times a week    Frequency of Social Gatherings with Friends and Family: Once a week    Attends Religious Services: Never    Database Administrator or Organizations: No    Attends Engineer, Structural: Not on file    Marital Status: Married  Intimate Partner Violence: Unknown (09/23/2021)   Received from [facility]   HITS    Physically Hurt: Not on file    Insult or Talk Down To: Not on file    Threaten Physical Harm: Not on file    Scream or Curse: Not on file       Objective:  BP 131/87   Pulse 75   Temp 98.8 F (37.1 C)   Ht 5' 1.5 (1.562 m)   Wt 169 lb 12.8 oz (77 kg)   SpO2 95%   BMI 31.56 kg/m    Physical Exam  Gen. Pleasant, well-nourished, in no distress ENT - no thrush, no pallor/icterus,no post nasal drip Neck: No JVD, no thyromegaly, no carotid bruits Lungs: no use of accessory muscles, no dullness to percussion, clear without rales or rhonchi  Cardiovascular: Rhythm regular, heart sounds  normal, no murmurs or gallops, no peripheral edema Musculoskeletal: No deformities, no cyanosis or clubbing        Assessment & Plan:  Assessment and Plan Assessment & Plan Obstructive sleep apnea Moderate obstructive sleep apnea with 22 events per hour on initial sleep study. Significant weight loss of 40 pounds since the last study may have improved the condition. Current CPAP therapy reduces events to 0.2-0.3 per hour, indicating effective treatment, great usage, no lak Persistent fatigue suggests other contributing factors such as sarcoidosis or its treatment. Cardiac risk is low if events are less than 15 per hour. Inspire implant not recommended due to uncertainty of current apnea severity and potential for further improvement with weight loss. Dental appliance discussed as an alternative if CPAP is not tolerated or effective. - Ordered repeat home sleep study to assess current apnea severity. -  Adjusted CPAP settings to  5-10 cm H2O pressure range. - Will consider dental appliance if CPAP is not tolerated or effective. - Will reassess treatment plan based on repeat sleep study results. -Hold off HNS for now,doubt this will be necessary       No follow-ups on file.   Harden ROCKFORD Jude, MD

## 2024-05-09 ENCOUNTER — Other Ambulatory Visit: Payer: Self-pay | Admitting: Student

## 2024-05-09 DIAGNOSIS — G35D Multiple sclerosis, unspecified: Secondary | ICD-10-CM

## 2024-05-20 ENCOUNTER — Ambulatory Visit: Admitting: Obstetrics and Gynecology

## 2024-05-22 LAB — HM MAMMOGRAPHY

## 2024-05-23 ENCOUNTER — Encounter: Payer: Self-pay | Admitting: Obstetrics and Gynecology

## 2024-05-23 LAB — COMPREHENSIVE METABOLIC PANEL WITH GFR
ALT: 15 IU/L (ref 0–32)
AST: 20 IU/L (ref 0–40)
Albumin: 4.6 g/dL (ref 3.8–4.9)
Alkaline Phosphatase: 103 IU/L (ref 49–135)
BUN/Creatinine Ratio: 15 (ref 9–23)
BUN: 14 mg/dL (ref 6–24)
Bilirubin Total: 0.4 mg/dL (ref 0.0–1.2)
CO2: 23 mmol/L (ref 20–29)
Calcium: 9.4 mg/dL (ref 8.7–10.2)
Chloride: 101 mmol/L (ref 96–106)
Creatinine, Ser: 0.92 mg/dL (ref 0.57–1.00)
Globulin, Total: 2.3 g/dL (ref 1.5–4.5)
Glucose: 86 mg/dL (ref 70–99)
Potassium: 4.6 mmol/L (ref 3.5–5.2)
Sodium: 140 mmol/L (ref 134–144)
Total Protein: 6.9 g/dL (ref 6.0–8.5)
eGFR: 72 mL/min/1.73 (ref >59)

## 2024-05-23 LAB — NMR LIPOPROFILE+LIPIDS+IR
Cholesterol, Total: 255 mg/dL — ABNORMAL HIGH (ref 100–199)
HDL Particle Number: 37.6 umol/L (ref >=30.5)
HDL Size: 9.8 nm (ref >=9.2)
HDL-C: 70 mg/dL (ref >39)
LDL Particle Number: 1565 nmol/L — ABNORMAL HIGH (ref <1000)
LDL Size: 22.2 nm (ref >20.5)
LDL Size: 22.2 nm (ref >=20.8)
LDL-C (NIH Calc): 159 mg/dL — ABNORMAL HIGH (ref 0–99)
LP-IR Score: 30 (ref <=45)
Large HDL-P: 12.3 umol/L (ref >=4.8)
Large VLDL-P: 5 nmol/L — ABNORMAL HIGH (ref <=2.7)
Small LDL Particle Number: 155 nmol/L (ref <=527)
Small LDL-P: 155 nmol/L (ref <=527)
Triglycerides: 146 mg/dL (ref 0–149)
VLDL Size: 47 nm — ABNORMAL HIGH (ref <=46.6)

## 2024-05-24 ENCOUNTER — Ambulatory Visit: Payer: Self-pay | Admitting: Obstetrics and Gynecology

## 2024-05-26 ENCOUNTER — Encounter

## 2024-05-26 DIAGNOSIS — G4733 Obstructive sleep apnea (adult) (pediatric): Secondary | ICD-10-CM

## 2024-05-27 ENCOUNTER — Ambulatory Visit (HOSPITAL_BASED_OUTPATIENT_CLINIC_OR_DEPARTMENT_OTHER): Payer: Self-pay | Admitting: Family Medicine

## 2024-05-27 NOTE — Progress Notes (Signed)
 Hi [name],  Your LDL particle number has improved from check in February. Your LDL has consistently remained around the 150's in the past year. Your triglycerides have improved from last check in February as well. Your HDL did increase as well which is great for heart protection. At this time based on risk stratification, your ASCVD risk does not indicate suggested need for statin therapy. Please continue to work on your LDL with supplementation and heart healthy diet.

## 2024-05-28 ENCOUNTER — Encounter: Payer: Self-pay | Admitting: Obstetrics and Gynecology

## 2024-06-02 ENCOUNTER — Other Ambulatory Visit

## 2024-06-04 ENCOUNTER — Other Ambulatory Visit

## 2024-06-08 ENCOUNTER — Telehealth: Payer: Self-pay | Admitting: Pulmonary Disease

## 2024-06-08 DIAGNOSIS — G4733 Obstructive sleep apnea (adult) (pediatric): Secondary | ICD-10-CM | POA: Diagnosis not present

## 2024-06-08 NOTE — Telephone Encounter (Signed)
 Call patient  Sleep study result  Date of study: 05/26/2024  Impression:  Moderate obstructive sleep apnea with AHI of 17.7 with O2 nadir of 82% Saturations below 88% for 3 minutes Previous sleep study showed AHI of 22  Recommendation:  Suggest to continue current CPAP therapy Other therapies may be considered as clinically indicated. Avoid alcohol, sedatives and other CNS depressants that may worsen sleep apnea and disrupt normal sleep architecture. Sleep hygiene should be reviewed to assess factors that may improve sleep quality. Weight management and regular exercise should be continued

## 2024-06-09 ENCOUNTER — Ambulatory Visit (HOSPITAL_COMMUNITY)
Admission: RE | Admit: 2024-06-09 | Discharge: 2024-06-09 | Disposition: A | Discharge status: Home / Self Care | Source: Ambulatory Visit | Attending: Pulmonary Disease | Admitting: Pulmonary Disease

## 2024-06-09 DIAGNOSIS — N200 Calculus of kidney: Secondary | ICD-10-CM | POA: Diagnosis not present

## 2024-06-09 DIAGNOSIS — R591 Generalized enlarged lymph nodes: Secondary | ICD-10-CM | POA: Insufficient documentation

## 2024-06-09 DIAGNOSIS — D869 Sarcoidosis, unspecified: Secondary | ICD-10-CM | POA: Diagnosis present

## 2024-06-09 DIAGNOSIS — R160 Hepatomegaly, not elsewhere classified: Secondary | ICD-10-CM | POA: Insufficient documentation

## 2024-06-09 DIAGNOSIS — M898X8 Other specified disorders of bone, other site: Secondary | ICD-10-CM | POA: Diagnosis not present

## 2024-06-09 DIAGNOSIS — I700 Atherosclerosis of aorta: Secondary | ICD-10-CM | POA: Insufficient documentation

## 2024-06-09 LAB — GLUCOSE, CAPILLARY: Glucose-Capillary: 96 mg/dL (ref 70–99)

## 2024-06-09 MED ORDER — FLUDEOXYGLUCOSE F - 18 (FDG) INJECTION
8.2200 | Freq: Once | INTRAVENOUS | Status: AC
  Administered 2024-06-09: 8.22 via INTRAVENOUS

## 2024-06-16 NOTE — Telephone Encounter (Signed)
 Pt.notified

## 2024-06-17 ENCOUNTER — Encounter: Payer: Self-pay | Admitting: Obstetrics and Gynecology

## 2024-06-17 ENCOUNTER — Encounter (HOSPITAL_BASED_OUTPATIENT_CLINIC_OR_DEPARTMENT_OTHER): Payer: Self-pay | Admitting: Pulmonary Disease

## 2024-06-17 ENCOUNTER — Telehealth (HOSPITAL_BASED_OUTPATIENT_CLINIC_OR_DEPARTMENT_OTHER): Payer: Self-pay | Admitting: Obstetrics and Gynecology

## 2024-06-17 NOTE — Telephone Encounter (Signed)
 Please advise if we can proceed with orthodontist referral

## 2024-06-18 ENCOUNTER — Telehealth: Payer: Self-pay

## 2024-06-18 NOTE — Telephone Encounter (Signed)
 Updated [name] that we have sent her information over to our billing department to have them reach out to the insurance company. I did explain she will receive another EOB but it may take weeks to recieve. I reiterated we will keep her updated with changes

## 2024-06-18 NOTE — Telephone Encounter (Signed)
 I spoke with [name] to let her know we received her message. I explained that Amy, who handles prior authorizations, is out until 1/5, but that we will follow up with her next week and that the matter is a high priority.

## 2024-06-23 ENCOUNTER — Ambulatory Visit
Admission: RE | Admit: 2024-06-23 | Discharge: 2024-06-23 | Disposition: A | Discharge status: Home / Self Care | Source: Ambulatory Visit | Attending: Student | Admitting: Student

## 2024-06-23 DIAGNOSIS — G35D Multiple sclerosis, unspecified: Secondary | ICD-10-CM

## 2024-06-23 MED ORDER — GADOPICLENOL 0.5 MMOL/ML IV SOLN
1.0000 mL | Freq: Once | INTRAVENOUS | Status: AC | PRN
  Administered 2024-06-23: 1 mL via INTRAVENOUS

## 2024-06-25 ENCOUNTER — Ambulatory Visit
Admission: RE | Admit: 2024-06-25 | Discharge: 2024-06-25 | Disposition: A | Discharge status: Home / Self Care | Source: Ambulatory Visit | Attending: Student | Admitting: Student

## 2024-06-25 DIAGNOSIS — G35D Multiple sclerosis, unspecified: Secondary | ICD-10-CM

## 2024-06-25 MED ORDER — GADOPICLENOL 0.5 MMOL/ML IV SOLN
8.0000 mL | Freq: Once | INTRAVENOUS | Status: AC | PRN
  Administered 2024-06-25: 8 mL via INTRAVENOUS

## 2024-06-26 ENCOUNTER — Ambulatory Visit (HOSPITAL_BASED_OUTPATIENT_CLINIC_OR_DEPARTMENT_OTHER): Payer: Self-pay | Admitting: Pulmonary Disease

## 2024-06-26 DIAGNOSIS — R948 Abnormal results of function studies of other organs and systems: Secondary | ICD-10-CM

## 2024-06-27 ENCOUNTER — Other Ambulatory Visit (HOSPITAL_BASED_OUTPATIENT_CLINIC_OR_DEPARTMENT_OTHER): Payer: Self-pay

## 2024-06-27 ENCOUNTER — Telehealth: Payer: Self-pay | Admitting: Hematology and Oncology

## 2024-06-27 DIAGNOSIS — R948 Abnormal results of function studies of other organs and systems: Secondary | ICD-10-CM

## 2024-06-27 NOTE — Telephone Encounter (Signed)
 spoke with pt about scheduling appt. Pt is aware of new appt date and time.

## 2024-07-01 LAB — LACTATE DEHYDROGENASE: LDH: 188 IU/L (ref 119–226)

## 2024-07-01 LAB — PROTEIN ELECTROPHORESIS, SERUM
A/G Ratio: 1.2 (ref 0.7–1.7)
Albumin ELP: 3.7 g/dL (ref 2.9–4.4)
Alpha 1: 0.3 g/dL (ref 0.0–0.4)
Alpha 2: 0.9 g/dL (ref 0.4–1.0)
Beta: 1.2 g/dL (ref 0.7–1.3)
Gamma Globulin: 0.9 g/dL (ref 0.4–1.8)
Globulin, Total: 3.2 g/dL (ref 2.2–3.9)
Total Protein: 6.9 g/dL (ref 6.0–8.5)

## 2024-07-01 LAB — KAPPA/LAMBDA LIGHT CHAINS
Ig Kappa Free Light Chain: 15.3 mg/L (ref 3.3–19.4)
Ig Lambda Free Light Chain: 18.2 mg/L (ref 5.7–26.3)
KAPPA/LAMBDA RATIO: 0.84 (ref 0.26–1.65)

## 2024-07-03 ENCOUNTER — Encounter (HOSPITAL_BASED_OUTPATIENT_CLINIC_OR_DEPARTMENT_OTHER): Payer: Self-pay | Admitting: Pulmonary Disease

## 2024-07-03 ENCOUNTER — Inpatient Hospital Stay: Attending: Hematology and Oncology | Admitting: Hematology and Oncology

## 2024-07-03 ENCOUNTER — Other Ambulatory Visit: Payer: Self-pay | Admitting: Hematology and Oncology

## 2024-07-03 ENCOUNTER — Inpatient Hospital Stay

## 2024-07-03 VITALS — BP 127/70 | HR 65 | Temp 97.2°F | Resp 13 | Wt 168.1 lb

## 2024-07-03 DIAGNOSIS — M899 Disorder of bone, unspecified: Secondary | ICD-10-CM | POA: Insufficient documentation

## 2024-07-03 DIAGNOSIS — R898 Other abnormal findings in specimens from other organs, systems and tissues: Secondary | ICD-10-CM | POA: Diagnosis not present

## 2024-07-03 DIAGNOSIS — D869 Sarcoidosis, unspecified: Secondary | ICD-10-CM | POA: Diagnosis present

## 2024-07-03 DIAGNOSIS — Z800 Family history of malignant neoplasm of digestive organs: Secondary | ICD-10-CM | POA: Insufficient documentation

## 2024-07-03 LAB — CBC WITH DIFFERENTIAL (CANCER CENTER ONLY)
Abs Immature Granulocytes: 0.03 K/uL (ref 0.00–0.07)
Basophils Absolute: 0.1 K/uL (ref 0.0–0.1)
Basophils Relative: 2 %
Eosinophils Absolute: 0.4 K/uL (ref 0.0–0.5)
Eosinophils Relative: 5 %
HCT: 43.1 % (ref 36.0–46.0)
Hemoglobin: 14.7 g/dL (ref 12.0–15.0)
Immature Granulocytes: 0 %
Lymphocytes Relative: 19 %
Lymphs Abs: 1.3 K/uL (ref 0.7–4.0)
MCH: 30.5 pg (ref 26.0–34.0)
MCHC: 34.1 g/dL (ref 30.0–36.0)
MCV: 89.4 fL (ref 80.0–100.0)
Monocytes Absolute: 0.7 K/uL (ref 0.1–1.0)
Monocytes Relative: 10 %
Neutro Abs: 4.3 K/uL (ref 1.7–7.7)
Neutrophils Relative %: 64 %
Platelet Count: 319 K/uL (ref 150–400)
RBC: 4.82 MIL/uL (ref 3.87–5.11)
RDW: 13.1 % (ref 11.5–15.5)
WBC Count: 6.7 K/uL (ref 4.0–10.5)
nRBC: 0 % (ref 0.0–0.2)

## 2024-07-03 LAB — CMP (CANCER CENTER ONLY)
ALT: 17 U/L (ref 0–44)
AST: 31 U/L (ref 15–41)
Albumin: 4.6 g/dL (ref 3.5–5.0)
Alkaline Phosphatase: 93 U/L (ref 38–126)
Anion gap: 12 (ref 5–15)
BUN: 12 mg/dL (ref 6–20)
CO2: 27 mmol/L (ref 22–32)
Calcium: 9.5 mg/dL (ref 8.9–10.3)
Chloride: 103 mmol/L (ref 98–111)
Creatinine: 0.89 mg/dL (ref 0.44–1.00)
GFR, Estimated: >60 mL/min
Glucose, Bld: 96 mg/dL (ref 70–99)
Potassium: 4.3 mmol/L (ref 3.5–5.1)
Sodium: 141 mmol/L (ref 135–145)
Total Bilirubin: 0.4 mg/dL (ref 0.0–1.2)
Total Protein: 7.6 g/dL (ref 6.5–8.1)

## 2024-07-03 LAB — SEDIMENTATION RATE: Sed Rate: 7 mm/h (ref 0–22)

## 2024-07-03 LAB — LACTATE DEHYDROGENASE: LDH: 190 U/L (ref 105–235)

## 2024-07-03 LAB — C-REACTIVE PROTEIN: CRP: <0.5 mg/dL

## 2024-07-03 NOTE — Progress Notes (Signed)
 " [name] [phone]   [phone]  PROGRESS NOTE  Patient Care Team: Knute Thersia Bitters, [name] as PCP - General (Family Medicine) Dina Camie FORBES RIGGERS (Neurology)  Hematological/Oncological History # Sarcoidosis  # PET Avid Multifocal Bone Lesions 05/19/2022: Establish care with Dr. Federico due to splenic lesions 06/30/2022: Excisional lymph node biopsy findings concerning for sarcoidosis 05/07/2023: PET CT scan showed interval resolution of previously FDG avid lymphadenopathy, however there were multiple new scattered foci of hypermetabolism in the spine, pelvis, and proximal femur. 1/5-06/25/2024: MRI of the brain, cervical spine, thoracic spine, and lumbar spine showed the FDG avid lesions, noted they appeared stable/indolent.  Interval History:  [name] [name] 59 y.o. female with medical history significant for sarcoidosis who presents for a follow up visit. The patient's last visit was on 05/19/2022 at which time she establish care due to splenic lesions. In the interim since the last visit she was diagnosed with sarcoidosis from an excisional lymph node biopsy and subsequently underwent treatment for sarcoidosis under the care of the pulmonary team.  On exam today [name] reports been well overall since our last visit.  She reports that she has had hip pain for about 1 year but is not having any other overt signs of bone or back pain.  She reports she has had no issues with fevers or chills but does have some occasional bouts of sweats.  She has lost about 40 pounds from March to July thanks to an anti-inflammatory diet.  She notes that she has had no other infectious symptoms such as runny nose, sore throat, cough.  Overall she is at her baseline level of health and has no additional questions concerns or complaints today.  Full 10 point ROS is otherwise negative.  The bulk of our discussion focused on the results of her PET CT scan and steps  moving forward.  We discussed options including monitoring versus a bone marrow biopsy.  She was agreeable to pursuing the bone marrow biopsy procedure.  MEDICAL HISTORY:  Past Medical History:  Diagnosis Date   Back pain    Constipation    Contact dermatitis 02/11/2021   Elevated LFTs 12/12/2018   Fatty liver    Fen-phen history 11/24/2018   Generalized anxiety disorder 09/25/2022   Hypercholesteremia    Hypertension    Intestinal bacterial overgrowth    Joint pain    Kidney stones    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   Lumbar pain 07/12/2021   Lymphadenopathy 06/13/2022   Migraines    Mild cognitive impairment 02/02/2023   Mixed hyperlipidemia 12/12/2018   Morbid obesity 11/24/2018   Myalgia, unspecified site 06/21/2021   Nausea in adult    Obstructive sleep apnea    mild, no CPAP   Other allergic rhinitis 04/04/2022   Pain in thoracic spine 07/18/2021   Sarcoidosis    Sleep disturbance 10/08/2019   Splenic lesion 05/20/2022   Subclinical hypothyroidism 09/2009   Type 2 diabetes mellitus without complication, without long-term current use of insulin  05/05/2022    SURGICAL HISTORY: Past Surgical History:  Procedure Laterality Date   BIOPSY THYROID  Right 07/07/2021   FNA Right nodule benign   BLADDER SUSPENSION N/A 06/05/2022   Procedure: TRANSVAGINAL TAPE (TVT) PROCEDURE;  Surgeon: Marilynne Rosaline SAILOR, MD;  Location: [facility] ;  Service: Gynecology;  Laterality: N/A;  total time requested is 1 hour   COLONOSCOPY     CYSTOSCOPY N/A 06/05/2022   Procedure: CYSTOSCOPY;  Surgeon: Marilynne Rosaline SAILOR, MD;  Location: [facility];  Service: Gynecology;  Laterality: N/A;   LITHOTRIPSY     LYMPH NODE DISSECTION     NOVASURE ABLATION  12/18/2006   RADIOACTIVE SEED GUIDED EXCISIONAL BREAST BIOPSY Right 05/30/2021   Procedure: RADIOACTIVE SEED GUIDED EXCISIONAL RIGHT BREAST BIOPSY;  Surgeon: Ebbie Cough, MD;  Location: [facility];  Service: General;  Laterality: Right;   ROTATOR CUFF REPAIR Right 06/20/2007    SOCIAL HISTORY: Social History   Socioeconomic History   Marital status: Married    Spouse name: Koren   Number of children: 2   Years of education: 16   Highest education level: Bachelor's degree (e.g., BA, AB, BS)  Occupational History   Occupation: IT TRAINER  Tobacco Use   Smoking status: Never    Passive exposure: Never   Smokeless tobacco: Never  Vaping Use   Vaping status: Never Used  Substance and Sexual Activity   Alcohol use: No    Alcohol/week: 0.0 standard drinks of alcohol   Drug use: No   Sexual activity: Yes    Partners: Male    Birth control/protection: Surgical    Comment: Ablation, Vasectomy  Other Topics Concern   Not on file  Social History Narrative   Right handed   Drinks coffee   Lives with husband   Two story home   unemployed   Social Drivers of Health   Tobacco Use: Low Risk (05/08/2024)   Patient History    Smoking Tobacco Use: Never    Smokeless Tobacco Use: Never    Passive Exposure: Never  Financial Resource Strain: Low Risk (04/23/2023)   Overall Financial Resource Strain (CARDIA)    Difficulty of Paying Living Expenses: Not hard at all  Food Insecurity: No Food Insecurity (04/23/2023)   Hunger Vital Sign    Worried About Running Out of Food in the Last Year: Never true    Ran Out of Food in the Last Year: Never true  Transportation Needs: No Transportation Needs (04/23/2023)   PRAPARE - Administrator, Civil Service (Medical): No    Lack of Transportation (Non-Medical): No  Physical Activity: Unknown (04/23/2023)   Exercise Vital Sign    Days of Exercise per Week: 3 days    Minutes of Exercise per Session: Not on file  Stress: No Stress Concern Present (04/23/2023)   [employer] of Occupational Health - Occupational Stress Questionnaire    Feeling of Stress : Not at all  Social Connections: Moderately Isolated (04/23/2023)   Social  Connection and Isolation Panel    Frequency of Communication with Friends and Family: More than three times a week    Frequency of Social Gatherings with Friends and Family: Once a week    Attends Religious Services: Never    Database Administrator or Organizations: No    Attends Engineer, Structural: Not on file    Marital Status: Married  Intimate Partner Violence: Unknown (09/23/2021)   Received from [facility]   HITS    Physically Hurt: Not on file    Insult or Talk Down To: Not on file    Threaten Physical Harm: Not on file    Scream or Curse: Not on file  Depression (PHQ2-9): Low Risk (05/07/2024)   Depression (PHQ2-9)    PHQ-2 Score: 0  Alcohol Screen: Not on file  Housing: Low Risk (04/23/2023)   Housing    Last Housing Risk Score: 0  Utilities:  Not on file  Health Literacy: Not on file    FAMILY HISTORY: Family History  Problem Relation Age of Onset   Diabetes Mother    Hyperlipidemia Mother    Thyroid  disease Mother    Anxiety disorder Mother    Obesity Mother    Aortic stenosis Mother    Arthritis-Osteo Sister    Colon cancer Paternal Grandmother    Heart disease Paternal Grandfather    Pancreatic cancer Neg Hx    Stomach cancer Neg Hx    Liver disease Neg Hx    Esophageal cancer Neg Hx     ALLERGIES:  is allergic to tizanidine hcl and clarithromycin.  MEDICATIONS:  Current Outpatient Medications  Medication Sig Dispense Refill   BREZTRI  AEROSPHERE 160-9-4.8 MCG/ACT AERO Inhale 2 puffs into the lungs in the morning and at bedtime. 32.1 g 3   DULoxetine (CYMBALTA) 30 MG capsule Take by mouth daily.     estradiol  (ESTRACE ) 0.01 % CREA vaginal cream Place 1/2 gram in the vagina every night for two weeks, and then place 1/2 gram in the vagina twice a week at bedtime. 42.5 g 2   Lacosamide 100 MG TABS Take 2 tablets by mouth 2 (two) times daily.     lidocaine  (XYLOCAINE ) 5 % ointment Apply 1 Application topically 3 (three) times daily. Use as needed.  1.25 g 2   nystatin -triamcinolone  ointment (MYCOLOG) Apply 1 Application topically 2 (two) times daily. Use for a one week as needed. 30 g 2   ondansetron  (ZOFRAN ) 4 MG tablet Take 1 tablet (4 mg total) by mouth daily at 2 PM. 30 tablet 0   tirzepatide  (MOUNJARO ) 7.5 MG/0.5ML Pen Inject 7.5 mg into the skin once a week. 6 mL 3   TRYPTYR 0.003 % SOLN Apply 1 drop to eye.     valACYclovir  (VALTREX ) 1000 MG tablet Take 2 tablet (2000 mg) by mouth twice a day for one day as needed. 30 tablet 2   No current facility-administered medications for this visit.    REVIEW OF SYSTEMS:   Constitutional: ( - ) fevers, ( - )  chills , ( - ) night sweats Eyes: ( - ) blurriness of vision, ( - ) double vision, ( - ) watery eyes Ears, nose, mouth, throat, and face: ( - ) mucositis, ( - ) sore throat Respiratory: ( - ) cough, ( - ) dyspnea, ( - ) wheezes Cardiovascular: ( - ) palpitation, ( - ) chest discomfort, ( - ) lower extremity swelling Gastrointestinal:  ( - ) nausea, ( - ) heartburn, ( - ) change in bowel habits Skin: ( - ) abnormal skin rashes Lymphatics: ( - ) new lymphadenopathy, ( - ) easy bruising Neurological: ( - ) numbness, ( - ) tingling, ( - ) new weaknesses Behavioral/Psych: ( - ) mood change, ( - ) new changes  All other systems were reviewed with the patient and are negative.  PHYSICAL EXAMINATION:  Vitals:   07/03/24 0935  BP: 127/70  Pulse: 65  Resp: 13  Temp: (!) 97.2 F (36.2 C)  SpO2: 100%   Filed Weights   07/03/24 0935  Weight: 168 lb 1.6 oz (76.2 kg)    GENERAL: Well-appearing middle-age Caucasian female, alert, no distress and comfortable SKIN: skin color, texture, turgor are normal, no rashes or significant lesions EYES: conjunctiva are pink and non-injected, sclera clear LUNGS: clear to auscultation and percussion with normal breathing effort HEART: regular rate & rhythm and no murmurs and no  lower extremity edema Musculoskeletal: no cyanosis of digits and no  clubbing  PSYCH: alert & oriented x 3, fluent speech NEURO: no focal motor/sensory deficits  LABORATORY DATA:  I have reviewed the data as listed    Latest Ref Rng & Units 07/03/2024    9:13 AM 02/04/2024    9:26 AM 10/15/2023   11:03 AM  CBC  WBC 4.0 - 10.5 K/uL 6.7  8.1  8.1   Hemoglobin 12.0 - 15.0 g/dL 85.2  86.3  86.3   Hematocrit 36.0 - 46.0 % 43.1  40.6  41.1   Platelets 150 - 400 K/uL 319  352  346        Latest Ref Rng & Units 07/03/2024    9:13 AM 06/27/2024   10:56 AM 05/22/2024    8:43 AM  CMP  Glucose 70 - 99 mg/dL 96   86   BUN 6 - 20 mg/dL 12   14   Creatinine 9.55 - 1.00 mg/dL 9.10   9.07   Sodium 864 - 145 mmol/L 141   140   Potassium 3.5 - 5.1 mmol/L 4.3   4.6   Chloride 98 - 111 mmol/L 103   101   CO2 22 - 32 mmol/L 27   23   Calcium 8.9 - 10.3 mg/dL 9.5   9.4   Total Protein 6.5 - 8.1 g/dL 7.6  6.9  6.9   Total Bilirubin 0.0 - 1.2 mg/dL 0.4   0.4   Alkaline Phos 38 - 126 U/L 93   103   AST 15 - 41 U/L 31   20   ALT 0 - 44 U/L 17   15     No results found for: MPROTEIN No results found for: KPAFRELGTCHN, LAMBDASER, KAPLAMBRATIO  RADIOGRAPHIC STUDIES: MR BRAIN W WO CONTRAST Result Date: 06/27/2024 MR BRAIN WITHOUT THEN WITH IV CONTRAST COMPARISON: 12/24/2023 CLINICAL HISTORY: Multiple sclerosis follow-up. TECHNIQUE: MRI of the brain with and without IV contrast. FINDINGS: There is normal development of the brain. Diffusion weighted images demonstrate no acute ischemic event. There are a few relatively stable FLAIR hyperintensities in the left periventricular frontal lobe and parietal lobe. No significant progression or suspicious enhancement is present. There is no midline shift, mass effect, hydrocephalus or hemorrhage. Appropriate flow voids are seen in the intracranial vessels. The visualized paranasal sinuses, orbits and mastoid air cells are clear. IMPRESSION: Stable mild FLAIR nonspecific hyperintensities. No significant interval change. No suspicious  enhancement identified. No acute intracranial abnormality. Electronically signed by: Norleen Satchel MD 06/27/2024 01:46 PM EST RP Workstation: [id number]   MR CERVICAL SPINE W WO CONTRAST Result Date: 06/27/2024 MR CERVICAL SPINE WITHOUT THEN WITH IV CONTRAST COMPARISON: 04/18/2023 CLINICAL HISTORY: Multiple sclerosis follow-up. Pain. TECHNIQUE: T1, T2 and STIR sagittal images were performed. Axial T2-weighted images were performed through the cervical spine with and without IV contrast. IV contrast. FINDINGS: There is stable degenerative spondylosis and slight loss of the normal cervical lordosis. Stable small area of low attenuation the posterior C7 vertebral body demonstrating low signal on T1, high signal on T2 with enhancement. This is nonspecific. Again, its stability suggests an indolent process. There is no significant interval change compared the prior examination. There is no vertebral body height loss, subluxation or marrow replacing process. Cervical cord is normal in signal. Posterior fossa demonstrates no abnormality. C2-3: There is no focal disc protrusion, foraminal or spinal stenosis. Mild facet arthrosis. C3-4: There is no focal disc protrusion, foraminal or spinal stenosis. Moderate facet  arthrosis, left greater than right. C4-5: Disc desiccation and mild left foraminal narrowing secondary to uncovertebral spine facet arthrosis. No high-grade foraminal or spinal stenosis. C5-6: Disc desiccation with mild-to-moderate bilateral foraminal narrowing, left greater than right. There is slight effacement of ventral thecal sac. Findings are stable. C6-7: Disc desiccation without significant foraminal or spinal stenosis. The upper thoracic spine demonstrates no abnormality. Stable dominant right thyroid  mass. Correlation with prior thyroid  ultrasound recommended. If fine-needle aspiration has not been performed aspiration recommended. IMPRESSION: Stable degenerative spondylosis with mild foraminal  narrowing secondary to disc disease and facet arthrosis. No significant interval change. Stable small lesion in the posterior C7 vertebral body. Its stability suggests an indolent process. Dominant right thyroid  lesion as above. Electronically signed by: Norleen Satchel MD 06/27/2024 01:42 PM EST RP Workstation: [id number]   MR Lumbar Spine W Wo Contrast Result Date: 06/27/2024 MR LUMBAR SPINE WITHOUT THEN WITH IV CONTRAST COMPARISON: Lumbar MRI 04/18/2023 CLINICAL HISTORY: Demyelinating disease follow-up. TECHNIQUE: SAG T2, SAG T1, SAG STIR, AX T2, AX T1 with and without IV contrast. FINDINGS: There is normal alignment of the lumbar spine. Mild disc desiccation is present. There is a relatively stable focal area of abnormal marrow signal in the superior L5 vertebral body. This is indeterminate. Slightly increased in activity on the recent PET/CT. This could reflect an atypical metastatic lesion in the appropriate clinical setting. Its stability suggesting at least an indolent process. There is no vertebral body height loss, subluxation or marrow replacing process. The sacrum and SI joints are unremarkable so far as visualized. Conus and cauda equina are unremarkable. T12-L1: There is no focal disc protrusion, foraminal or spinal stenosis. L1-2: There is no focal disc protrusion, foraminal or spinal stenosis. L2-3: There is no focal disc protrusion, foraminal or spinal stenosis. Stable mild facet arthrosis. L3-4: There is no focal disc protrusion, foraminal or spinal stenosis. Stable mild-to-moderate facet arthrosis. L4-5: There is no focal disc protrusion, foraminal or spinal stenosis. Stable moderate facet arthrosis. L5-S1: There is no focal disc protrusion, foraminal or spinal stenosis. Stable moderate facet arthrosis. Stable benign appearing cyst in the upper pole of the left kidney. Otherwise, the retroperitoneal structures demonstrate no significant abnormality. IMPRESSION: Stable facet arthrosis. No  significant foraminal or spinal stenosis. No suspicious enhancement. Stable lesion in the posterior superior L5 vertebral body which is slightly increased in uptake on the recent PET/CT. This could reflect an atypical metastatic lesion in the appropriate clinical setting. Its stability suggesting at least an indolent process. Electronically signed by: Norleen Satchel MD 06/27/2024 01:37 PM EST RP Workstation: [id number]   MR THORACIC SPINE W WO CONTRAST Result Date: 06/27/2024 MR THORACIC SPINE WITHOUT THEN WITH IV CONTRAST COMPARISON: 07/16/2023 CLINICAL HISTORY: Multiple sclerosis. TECHNIQUE: T1, T2 and STIR sagittal images were performed. Axial T2-weighted images were performed through the thoracic spine with and without IV contrast. FINDINGS: There is stable degenerative spondylosis and scoliotic curvature of the cervicothoracic spine. There is mild disc desiccation. There is no focal extrusion, foraminal or spinal stenosis. There is no vertebral body height loss or subluxation. There are a few likely benign hemangioma scattered throughout the thoracic spine. There is a new low signal lesion in the T6 vertebral body which was not apparent on the prior examination. This is of uncertain etiology. This was questionably increased in signal on the recent PET/CT. At a minimum follow-up recommended in 2-3 months to ensure stability. The thoracic cord is unremarkable. Right thyroid  nodule. See prior thyroid  ultrasound. IMPRESSION: Degenerative spondylosis. There are  a few new lesions and approximately T6 vertebral body which may have been increase in activity on PET/CT. These are indeterminate. Metastatic disease cannot be excluded. Follow-up MRI recommended in 2-3 months to ensure stability. No evidence of abnormal cord enhancement or cord disease. Electronically signed by: Norleen Satchel MD 06/27/2024 01:33 PM EST RP Workstation: [id number]   NM PET Image Restage (PS) Skull Base to Thigh (F-18 FDG) Result Date:  06/22/2024 CLINICAL DATA:  Subsequent treatment strategy for sarcoid. EXAM: NUCLEAR MEDICINE PET SKULL BASE TO THIGH TECHNIQUE: 8.2 mCi F-18 FDG was injected intravenously. Full-ring PET imaging was performed from the skull base to thigh after the radiotracer. CT data was obtained and used for attenuation correction and anatomic localization. Fasting blood glucose: 96 mg/dl COMPARISON:  88/81/7975. FINDINGS: Mediastinal blood pool activity: SUV max 2.2 Liver activity: SUV max NA NECK: No abnormal hypermetabolism. Incidental CT findings: None. CHEST: No abnormal hypermetabolism. Incidental CT findings: 3.7 cm low-attenuation right thyroid  nodule. Atherosclerotic calcification of the aorta. Heart is at the upper limits of normal in size. No pericardial effusion. ABDOMEN/PELVIS: No abnormal hypermetabolism. Incidental CT findings: Liver margin may be minimally irregular. Punctate right renal stone. SKELETON: Focal hypermetabolism the T6 vertebral body, unchanged. Several new focal areas of hypermetabolism in the spine, pelvis and proximal right femur. Index SUV max in the right femoral neck, 5.3. Incidental CT findings: Degenerative changes in the spine. IMPRESSION: 1. Multiple new scattered foci of hypermetabolism in the spine, pelvis and proximal right femur. Although sarcoid could have this appearance, metastatic disease is also considered. No additional evidence of metabolically active sarcoid in the neck, chest, abdomen or pelvis. 2. 3.7 cm low-attenuation right thyroid  nodule. Previous thyroid  ultrasound 05/05/2024. 3. Liver margin may be minimally irregular, raising suspicion for mild cirrhosis. 4. Punctate right renal stone. 5.  Aortic atherosclerosis (ICD10-I70.0). Electronically Signed   By: Newell Eke M.D.   On: 06/22/2024 16:57    ASSESSMENT & PLAN ZYONNA VARDAMAN 59 y.o. female with medical history significant for sarcoidosis who presents for a follow up visit.   # Sarcoidosis  # PET Avid  Multifocal Bone Lesions -- At this time etiology of the bone lesions could include lymphomas, sarcoidosis, or other bone marrow abnormality.  No clear evidence of a primary tumor elsewhere in the body. -- Patient is not currently having any focal symptoms or bone pain. -- Initial myeloma workup was unremarkable.  No evidence of a monoclonal gammopathy or suspicion for myeloma --An unrelated series of MRIs of the spine also showed these lesions, noted to be reassuringly benign appearing. -- Discussed options moving forward including monitoring with observation versus bone marrow biopsy.  The patient notes she like to pursue bone marrow biopsy. -- Will plan to discuss results with patient once bone marrow biopsy results return.  Orders Placed This Encounter  Procedures   CT BIOPSY    Standing Status:   Future    Expected Date:   07/15/2024    Expiration Date:   07/08/2025    Lab orders requested (DO NOT place separate lab orders, these will be automatically ordered during procedure specimen collection)::   Surgical Pathology    Reason for Exam (SYMPTOM  OR DIAGNOSIS REQUIRED):   requesting bone marrow biopsy due to FDG avid lesions in marrow    Preferred location?:   [facility]   CT BONE MARROW BIOPSY & ASPIRATION    Concern for Sarcoidosis vs lymphoma    Standing Status:   Future  Expected Date:   07/15/2024    Expiration Date:   07/08/2025    Reason for Exam (SYMPTOM  OR DIAGNOSIS REQUIRED):   requesting bone marrow biopsy due to FDG avid marrow lesions    Preferred location?:   [facility]    All questions were answered. The patient knows to call the clinic with any problems, questions or concerns.  A total of more than 30 minutes were spent on this encounter with face-to-face time and non-face-to-face time, including preparing to see the patient, ordering tests and/or medications, counseling the patient and coordination of care as outlined above.   Norleen IVAR Kidney,  MD Department of Hematology/Oncology [facility] Phone: [phone] Pager: [phone] Email: [email or web address]  07/08/2024 2:22 PM  "

## 2024-07-04 NOTE — Telephone Encounter (Signed)
 Please advise

## 2024-07-10 ENCOUNTER — Ambulatory Visit (HOSPITAL_BASED_OUTPATIENT_CLINIC_OR_DEPARTMENT_OTHER): Admitting: Pulmonary Disease

## 2024-07-23 ENCOUNTER — Other Ambulatory Visit: Payer: Self-pay | Admitting: Radiology

## 2024-07-23 ENCOUNTER — Other Ambulatory Visit (HOSPITAL_COMMUNITY): Payer: Self-pay | Admitting: Radiology

## 2024-07-23 DIAGNOSIS — R591 Generalized enlarged lymph nodes: Secondary | ICD-10-CM

## 2024-07-24 ENCOUNTER — Ambulatory Visit (HOSPITAL_COMMUNITY)
Admission: RE | Admit: 2024-07-24 | Discharge: 2024-07-24 | Disposition: A | Source: Ambulatory Visit | Attending: Hematology and Oncology

## 2024-07-24 ENCOUNTER — Other Ambulatory Visit: Payer: Self-pay

## 2024-07-24 ENCOUNTER — Encounter (HOSPITAL_COMMUNITY): Payer: Self-pay

## 2024-07-24 DIAGNOSIS — R591 Generalized enlarged lymph nodes: Secondary | ICD-10-CM

## 2024-07-24 DIAGNOSIS — R898 Other abnormal findings in specimens from other organs, systems and tissues: Secondary | ICD-10-CM

## 2024-07-24 LAB — CBC WITH DIFFERENTIAL/PLATELET
Abs Immature Granulocytes: 0.04 10*3/uL (ref 0.00–0.07)
Basophils Absolute: 0.1 10*3/uL (ref 0.0–0.1)
Basophils Relative: 2 %
Eosinophils Absolute: 0.4 10*3/uL (ref 0.0–0.5)
Eosinophils Relative: 5 %
HCT: 43.8 % (ref 36.0–46.0)
Hemoglobin: 14.5 g/dL (ref 12.0–15.0)
Immature Granulocytes: 1 %
Lymphocytes Relative: 18 %
Lymphs Abs: 1.2 10*3/uL (ref 0.7–4.0)
MCH: 30.1 pg (ref 26.0–34.0)
MCHC: 33.1 g/dL (ref 30.0–36.0)
MCV: 91.1 fL (ref 80.0–100.0)
Monocytes Absolute: 0.7 10*3/uL (ref 0.1–1.0)
Monocytes Relative: 10 %
Neutro Abs: 4.6 10*3/uL (ref 1.7–7.7)
Neutrophils Relative %: 64 %
Platelets: 314 10*3/uL (ref 150–400)
RBC: 4.81 MIL/uL (ref 3.87–5.11)
RDW: 13.1 % (ref 11.5–15.5)
WBC: 7 10*3/uL (ref 4.0–10.5)
nRBC: 0 % (ref 0.0–0.2)

## 2024-07-24 LAB — GLUCOSE, CAPILLARY: Glucose-Capillary: 113 mg/dL — ABNORMAL HIGH (ref 70–99)

## 2024-07-24 MED ORDER — MIDAZOLAM HCL (PF) 2 MG/2ML IJ SOLN
INTRAMUSCULAR | Status: AC | PRN
  Administered 2024-07-24: 1 mg via INTRAVENOUS

## 2024-07-24 MED ORDER — FENTANYL CITRATE (PF) 100 MCG/2ML IJ SOLN
INTRAMUSCULAR | Status: AC | PRN
  Administered 2024-07-24: 50 ug via INTRAVENOUS

## 2024-07-24 MED ORDER — FENTANYL CITRATE (PF) 100 MCG/2ML IJ SOLN
INTRAMUSCULAR | Status: AC
  Filled 2024-07-24: qty 2

## 2024-07-24 MED ORDER — MIDAZOLAM HCL 2 MG/2ML IJ SOLN
INTRAMUSCULAR | Status: AC
  Filled 2024-07-24: qty 2

## 2024-07-24 MED ORDER — SODIUM CHLORIDE 0.9 % IV SOLN: INTRAVENOUS | Status: DC

## 2024-07-24 NOTE — Procedures (Signed)
Interventional Radiology Procedure Note  Procedure: CT guided aspirate and core biopsy of right iliac bone  Complications: None  Recommendations: - Bedrest supine x 1 hrs - Hydrocodone PRN  Pain - Follow biopsy results   Arel Tippen, MD   

## 2024-07-24 NOTE — Progress Notes (Signed)
 0835 Ice bag given to use prn to low back for comfort per instructions.

## 2024-07-24 NOTE — Sedation Documentation (Signed)
 RN Rosina R pulled 2mg  Versed  and 100mcg Fentanyl  in CT pyxis.  Pt. Received 2mg  Versed  and 100mcg Fentanyl  throughout the procedure. Pt tolerated procedure well.

## 2024-07-24 NOTE — H&P (Signed)
 "     Chief Complaint: Patient was seen in consultation today for bone marrow lesions  Referring Physician(s): Dorsey,John T IV  Supervising Physician: Jennefer Rover  Patient Status: [facility] - Out-pt  History of Present Illness: [name] is a 59 y.o. female with past medical history of sarcoidosis, splenic lesions who has been followed by Heme/Onc since 2023 for lymphadenopathy.  Her most recent surveillance imaging shows stable FDG avid brain, cervical spine, thoracic spine, and lumbar spine lesions. Given her history of +/- avid lesions with no clear primary disease, patient elected to move forward with bone marrow biopsy.   She presents to [facility] today in her usual state of health.  She has been NPO.  She is FULL CODE for procedural purposes today.  Her husband is available for post-procedure care.   Past Medical History:  Diagnosis Date   Back pain    Constipation    Contact dermatitis 02/11/2021   Elevated LFTs 12/12/2018   Fatty liver    Fen-phen history 11/24/2018   Generalized anxiety disorder 09/25/2022   Hypercholesteremia    Hypertension    Intestinal bacterial overgrowth    Joint pain    Kidney stones    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   Lumbar pain 07/12/2021   Lymphadenopathy 06/13/2022   Migraines    Mild cognitive impairment 02/02/2023   Mixed hyperlipidemia 12/12/2018   Morbid obesity 11/24/2018   Myalgia, unspecified site 06/21/2021   Nausea in adult    Obstructive sleep apnea    mild, no CPAP   Other allergic rhinitis 04/04/2022   Pain in thoracic spine 07/18/2021   Sarcoidosis    Sleep disturbance 10/08/2019   Splenic lesion 05/20/2022   Subclinical hypothyroidism 09/2009   Type 2 diabetes mellitus without complication, without long-term current use of insulin  05/05/2022    Past Surgical History:  Procedure Laterality Date   BIOPSY THYROID  Right 07/07/2021   FNA Right nodule benign   BLADDER SUSPENSION N/A 06/05/2022   Procedure:  TRANSVAGINAL TAPE (TVT) PROCEDURE;  Surgeon: Marilynne Rosaline SAILOR, MD;  Location: [facility];  Service: Gynecology;  Laterality: N/A;  total time requested is 1 hour   COLONOSCOPY     CYSTOSCOPY N/A 06/05/2022   Procedure: CYSTOSCOPY;  Surgeon: Marilynne Rosaline SAILOR, MD;  Location: [facility];  Service: Gynecology;  Laterality: N/A;   LITHOTRIPSY     LYMPH NODE DISSECTION     NOVASURE ABLATION  12/18/2006   RADIOACTIVE SEED GUIDED EXCISIONAL BREAST BIOPSY Right 05/30/2021   Procedure: RADIOACTIVE SEED GUIDED EXCISIONAL RIGHT BREAST BIOPSY;  Surgeon: Ebbie Cough, MD;  Location: [facility];  Service: General;  Laterality: Right;   ROTATOR CUFF REPAIR Right 06/20/2007    Allergies: Tizanidine hcl and Clarithromycin  Medications: Prior to Admission medications  Medication Sig Start Date End Date Taking? Authorizing Provider  Lacosamide 100 MG TABS Take 2 tablets by mouth 2 (two) times daily.   Yes [provider]  TRYPTYR 0.003 % SOLN Apply 1 drop to eye.   Yes [provider]  BREZTRI  AEROSPHERE 160-9-4.8 MCG/ACT AERO Inhale 2 puffs into the lungs in the morning and at bedtime. 05/21/23   Kassie Acquanetta Bradley, MD  DULoxetine (CYMBALTA) 30 MG capsule Take by mouth daily. 05/01/24   [provider]  estradiol  (ESTRACE ) 0.01 % CREA vaginal cream Place 1/2 gram in the vagina every night for two weeks, and then place 1/2 gram in the vagina twice a  week at bedtime. 05/07/24   Cathlyn JAYSON Nikki Bobie FORBES, MD  lidocaine  (XYLOCAINE ) 5 % ointment Apply 1 Application topically 3 (three) times daily. Use as needed. 05/07/24   Amundson C Silva, Brook E, MD  nystatin -triamcinolone  ointment (MYCOLOG) Apply 1 Application topically 2 (two) times daily. Use for a one week as needed. 05/07/24   Cathlyn JAYSON Nikki Bobie FORBES, MD  ondansetron  (ZOFRAN ) 4 MG tablet Take 1 tablet (4 mg total) by mouth daily at 2 PM. 02/04/24   Kassie Acquanetta Bradley, MD   tirzepatide  (MOUNJARO ) 7.5 MG/0.5ML Pen Inject 7.5 mg into the skin once a week. 04/09/24   Shamleffer, Ibtehal Jaralla, MD  valACYclovir  (VALTREX ) 1000 MG tablet Take 2 tablet (2000 mg) by mouth twice a day for one day as needed. 05/07/24   Cathlyn JAYSON Nikki Bobie FORBES, MD     Family History  Problem Relation Age of Onset   Diabetes Mother    Hyperlipidemia Mother    Thyroid  disease Mother    Anxiety disorder Mother    Obesity Mother    Aortic stenosis Mother    Arthritis-Osteo Sister    Colon cancer Paternal Grandmother    Heart disease Paternal Grandfather    Pancreatic cancer Neg Hx    Stomach cancer Neg Hx    Liver disease Neg Hx    Esophageal cancer Neg Hx     Social History   Socioeconomic History   Marital status: Married    Spouse name: Koren   Number of children: 2   Years of education: 16   Highest education level: Bachelor's degree (e.g., BA, AB, BS)  Occupational History   Occupation: IT TRAINER  Tobacco Use   Smoking status: Never    Passive exposure: Never   Smokeless tobacco: Never  Vaping Use   Vaping status: Never Used  Substance and Sexual Activity   Alcohol use: No    Alcohol/week: 0.0 standard drinks of alcohol   Drug use: No   Sexual activity: Yes    Partners: Male    Birth control/protection: Surgical    Comment: Ablation, Vasectomy  Other Topics Concern   Not on file  Social History Narrative   Right handed   Drinks coffee   Lives with husband   Two story home   unemployed   Social Drivers of Health   Tobacco Use: Low Risk (07/24/2024)   Patient History    Smoking Tobacco Use: Never    Smokeless Tobacco Use: Never    Passive Exposure: Never  Financial Resource Strain: Low Risk (04/23/2023)   Overall Financial Resource Strain (CARDIA)    Difficulty of Paying Living Expenses: Not hard at all  Food Insecurity: No Food Insecurity (04/23/2023)   Hunger Vital Sign    Worried About Running Out of Food in the Last Year: Never true    Ran Out of  Food in the Last Year: Never true  Transportation Needs: No Transportation Needs (04/23/2023)   PRAPARE - Administrator, Civil Service (Medical): No    Lack of Transportation (Non-Medical): No  Physical Activity: Unknown (04/23/2023)   Exercise Vital Sign    Days of Exercise per Week: 3 days    Minutes of Exercise per Session: Not on file  Stress: No Stress Concern Present (04/23/2023)   [employer] of Occupational Health - Occupational Stress Questionnaire    Feeling of Stress : Not at all  Social Connections: Moderately Isolated (04/23/2023)   Social Connection and Isolation Panel  Frequency of Communication with Friends and Family: More than three times a week    Frequency of Social Gatherings with Friends and Family: Once a week    Attends Religious Services: Never    Database Administrator or Organizations: No    Attends Engineer, Structural: Not on file    Marital Status: Married  Depression (PHQ2-9): Low Risk (05/07/2024)   Depression (PHQ2-9)    PHQ-2 Score: 0  Alcohol Screen: Not on file  Housing: Low Risk (04/23/2023)   Housing    Last Housing Risk Score: 0  Utilities: Not on file  Health Literacy: Not on file     Review of Systems: A 12 point ROS discussed and pertinent positives are indicated in the HPI above.  All other systems are negative.  Review of Systems  Constitutional:  Negative for fatigue and fever.  Respiratory:  Negative for cough and shortness of breath.   Cardiovascular:  Negative for chest pain.  Gastrointestinal:  Negative for abdominal pain, nausea and vomiting.  Musculoskeletal:  Negative for back pain.  Psychiatric/Behavioral:  Negative for behavioral problems and confusion.     Vital Signs: BP 123/81   Pulse 80   Temp 98.4 F (36.9 C) (Oral)   Resp 18   Ht 5' 1.5 (1.562 m)   Wt 165 lb (74.8 kg)   LMP 10/21/2008   SpO2 97%   BMI 30.67 kg/m   Physical Exam Vitals and nursing note reviewed.   Constitutional:      General: She is not in acute distress.    Appearance: Normal appearance. She is not ill-appearing.  HENT:     Mouth/Throat:     Mouth: Mucous membranes are moist.     Pharynx: Oropharynx is clear.  Cardiovascular:     Rate and Rhythm: Normal rate and regular rhythm.  Pulmonary:     Effort: Pulmonary effort is normal. No respiratory distress.  Abdominal:     General: Abdomen is flat. There is no distension.     Palpations: Abdomen is soft.  Skin:    General: Skin is warm and dry.  Neurological:     General: No focal deficit present.     Mental Status: She is alert and oriented to person, place, and time. Mental status is at baseline.  Psychiatric:        Mood and Affect: Mood normal.        Behavior: Behavior normal.        Thought Content: Thought content normal.        Judgment: Judgment normal.      MD Evaluation Airway: WNL Heart: WNL Abdomen: WNL Chest/ Lungs: WNL ASA  Classification: 3 Mallampati/Airway Score: Two   Imaging: MR Lumbar Spine W Wo Contrast Result Date: 06/27/2024 MR LUMBAR SPINE WITHOUT THEN WITH IV CONTRAST COMPARISON: Lumbar MRI 04/18/2023 CLINICAL HISTORY: Demyelinating disease follow-up. TECHNIQUE: SAG T2, SAG T1, SAG STIR, AX T2, AX T1 with and without IV contrast. FINDINGS: There is normal alignment of the lumbar spine. Mild disc desiccation is present. There is a relatively stable focal area of abnormal marrow signal in the superior L5 vertebral body. This is indeterminate. Slightly increased in activity on the recent PET/CT. This could reflect an atypical metastatic lesion in the appropriate clinical setting. Its stability suggesting at least an indolent process. There is no vertebral body height loss, subluxation or marrow replacing process. The sacrum and SI joints are unremarkable so far as visualized. Conus and cauda equina are unremarkable.  T12-L1: There is no focal disc protrusion, foraminal or spinal stenosis. L1-2: There  is no focal disc protrusion, foraminal or spinal stenosis. L2-3: There is no focal disc protrusion, foraminal or spinal stenosis. Stable mild facet arthrosis. L3-4: There is no focal disc protrusion, foraminal or spinal stenosis. Stable mild-to-moderate facet arthrosis. L4-5: There is no focal disc protrusion, foraminal or spinal stenosis. Stable moderate facet arthrosis. L5-S1: There is no focal disc protrusion, foraminal or spinal stenosis. Stable moderate facet arthrosis. Stable benign appearing cyst in the upper pole of the left kidney. Otherwise, the retroperitoneal structures demonstrate no significant abnormality. IMPRESSION: Stable facet arthrosis. No significant foraminal or spinal stenosis. No suspicious enhancement. Stable lesion in the posterior superior L5 vertebral body which is slightly increased in uptake on the recent PET/CT. This could reflect an atypical metastatic lesion in the appropriate clinical setting. Its stability suggesting at least an indolent process. Electronically signed by: Norleen Satchel MD 06/27/2024 01:37 PM EST RP Workstation: [id number]   MR THORACIC SPINE W WO CONTRAST Result Date: 06/27/2024 MR THORACIC SPINE WITHOUT THEN WITH IV CONTRAST COMPARISON: 07/16/2023 CLINICAL HISTORY: Multiple sclerosis. TECHNIQUE: T1, T2 and STIR sagittal images were performed. Axial T2-weighted images were performed through the thoracic spine with and without IV contrast. FINDINGS: There is stable degenerative spondylosis and scoliotic curvature of the cervicothoracic spine. There is mild disc desiccation. There is no focal extrusion, foraminal or spinal stenosis. There is no vertebral body height loss or subluxation. There are a few likely benign hemangioma scattered throughout the thoracic spine. There is a new low signal lesion in the T6 vertebral body which was not apparent on the prior examination. This is of uncertain etiology. This was questionably increased in signal on the recent PET/CT.  At a minimum follow-up recommended in 2-3 months to ensure stability. The thoracic cord is unremarkable. Right thyroid  nodule. See prior thyroid  ultrasound. IMPRESSION: Degenerative spondylosis. There are a few new lesions and approximately T6 vertebral body which may have been increase in activity on PET/CT. These are indeterminate. Metastatic disease cannot be excluded. Follow-up MRI recommended in 2-3 months to ensure stability. No evidence of abnormal cord enhancement or cord disease. Electronically signed by: Norleen Satchel MD 06/27/2024 01:33 PM EST RP Workstation: [id number]    Labs:  CBC: Recent Labs    10/15/23 1103 02/04/24 0926 07/03/24 0913 07/24/24 0745  WBC 8.1 8.1 6.7 7.0  HGB 13.6 13.6 14.7 14.5  HCT 41.1 40.6 43.1 43.8  PLT 346 352 319 314    COAGS: No results for input(s): INR, APTT in the last 8760 hours.  BMP: Recent Labs    10/15/23 1103 02/04/24 0926 05/22/24 0843 07/03/24 0913  NA 143 141 140 141  K 4.2 4.5 4.6 4.3  CL 104 102 101 103  CO2 23 23 23 27   GLUCOSE 84 88 86 96  BUN 13 12 14 12   CALCIUM 9.5 9.3 9.4 9.5  CREATININE 0.85 0.79 0.92 0.89  GFRNONAA  --   --   --  >60    LIVER FUNCTION TESTS: Recent Labs    10/15/23 1103 02/04/24 0926 05/22/24 0843 06/27/24 1056 07/03/24 0913  BILITOT 0.4 0.4 0.4  --  0.4  AST 25 22 20   --  31  ALT 21 18 15   --  17  ALKPHOS 83 91 103  --  93  PROT 6.3 6.6 6.9 6.9 7.6  ALBUMIN 4.2 4.3 4.6  --  4.6    TUMOR MARKERS: No results for input(s):  AFPTM, CEA, CA199, CHROMGRNA in the last 8760 hours.  Assessment and Plan: Bone Marrow biopsy Patient with past medical history of sarcoidosis presents with complaint of FDG avid bone marrow lesions.  IR consulted for bone marrow biopsy at the request of Dr. Federico. Case reviewed by Dr. Jennefer who approves patient for procedure.  Patient presents today in their usual state of health.  She has been NPO and is not currently on blood thinners.    Risks and benefits of biopsy was discussed with the patient and/or patient's family including, but not limited to bleeding, infection, damage to adjacent structures or low yield requiring additional tests.  All of the questions were answered and there is agreement to proceed.  Consent signed and in chart.    Thank you for this interesting consult.  I greatly enjoyed meeting CLARKE AMBURN and look forward to participating in their care.  A copy of this report was sent to the requesting provider on this date.  Electronically Signed: Zephaniah Enyeart Sue-Ellen Lynnix Schoneman, PA 07/24/2024, 8:33 AM   I spent a total of  30 Minutes   in face to face in clinical consultation, greater than 50% of which was counseling/coordinating care for bone marrow lesions.   "

## 2024-07-24 NOTE — Discharge Instructions (Signed)
Discharge Instructions:   Please call Interventional Radiology clinic [phone] with any questions or concerns.  You may remove your dressing and shower tomorrow.    Bone Marrow Aspiration and Bone Marrow Biopsy, Adult, Care After This sheet gives you information about how to care for yourself after your procedure. Your health care provider may also give you more specific instructions. If you have problems or questions, contact your health care provider. What can I expect after the procedure? After the procedure, it is common to have: Mild pain and tenderness. Swelling. Bruising. Follow these instructions at home: Puncture site care  Follow instructions from your health care provider about how to take care of the puncture site. Make sure you: Wash your hands with soap and water before and after you change your bandage (dressing). If soap and water are not available, use hand sanitizer. Change your dressing as told by your health care provider. Check your puncture site every day for signs of infection. Check for: More redness, swelling, or pain. Fluid or blood. Warmth. Pus or a bad smell. Activity Return to your normal activities as told by your health care provider. Ask your health care provider what activities are safe for you. Do not lift anything that is heavier than 10 lb (4.5 kg), or the limit that you are told, until your health care provider says that it is safe. Do not drive for 24 hours if you were given a sedative during your procedure. General instructions  Take over-the-counter and prescription medicines only as told by your health care provider. Do not take baths, swim, or use a hot tub until your health care provider approves. Ask your health care provider if you may take showers. You may only be allowed to take sponge baths. If directed, put ice on the affected area. To do this: Put ice in a plastic bag. Place a towel between your skin and the bag. Leave the ice  on for 20 minutes, 2-3 times a day. Keep all follow-up visits as told by your health care provider. This is important. Contact a health care provider if: Your pain is not controlled with medicine. You have a fever. You have more redness, swelling, or pain around the puncture site. You have fluid or blood coming from the puncture site. Your puncture site feels warm to the touch. You have pus or a bad smell coming from the puncture site. Summary After the procedure, it is common to have mild pain, tenderness, swelling, and bruising. Follow instructions from your health care provider about how to take care of the puncture site and what activities are safe for you. Take over-the-counter and prescription medicines only as told by your health care provider. Contact a health care provider if you have any signs of infection, such as fluid or blood coming from the puncture site. This information is not intended to replace advice given to you by your health care provider. Make sure you discuss any questions you have with your health care provider. Document Revised: 10/22/2018 Document Reviewed: 10/22/2018 Elsevier Patient Education  2023 Elsevier Inc.   Moderate Conscious Sedation, Adult, Care After This sheet gives you information about how to care for yourself after your procedure. Your health care provider may also give you more specific instructions. If you have problems or questions, contact your health care provider. What can I expect after the procedure? After the procedure, it is common to have: Sleepiness for several hours. Impaired judgment for several hours. Difficulty with balance. Vomiting if   you eat too soon. Follow these instructions at home: For the time period you were told by your health care provider: Rest. Do not participate in activities where you could fall or become injured. Do not drive or use machinery. Do not drink alcohol. Do not take sleeping pills or medicines that  cause drowsiness. Do not make important decisions or sign legal documents. Do not take care of children on your own. Eating and drinking  Follow the diet recommended by your health care provider. Drink enough fluid to keep your urine pale yellow. If you vomit: Drink water, juice, or soup when you can drink without vomiting. Make sure you have little or no nausea before eating solid foods. General instructions Take over-the-counter and prescription medicines only as told by your health care provider. Have a responsible adult stay with you for the time you are told. It is important to have someone help care for you until you are awake and alert. Do not smoke. Keep all follow-up visits as told by your health care provider. This is important. Contact a health care provider if: You are still sleepy or having trouble with balance after 24 hours. You feel light-headed. You keep feeling nauseous or you keep vomiting. You develop a rash. You have a fever. You have redness or swelling around the IV site. Get help right away if: You have trouble breathing. You have new-onset confusion at home. Summary After the procedure, it is common to feel sleepy, have impaired judgment, or feel nauseous if you eat too soon. Rest after you get home. Know the things you should not do after the procedure. Follow the diet recommended by your health care provider and drink enough fluid to keep your urine pale yellow. Get help right away if you have trouble breathing or new-onset confusion at home. This information is not intended to replace advice given to you by your health care provider. Make sure you discuss any questions you have with your health care provider. Document Revised: 10/03/2019 Document Reviewed: 05/01/2019 Elsevier Patient Education  2023 Elsevier Inc.  

## 2024-08-04 ENCOUNTER — Ambulatory Visit (HOSPITAL_BASED_OUTPATIENT_CLINIC_OR_DEPARTMENT_OTHER): Admitting: Pulmonary Disease

## 2024-10-08 ENCOUNTER — Ambulatory Visit: Admitting: Internal Medicine

## 2025-05-11 ENCOUNTER — Ambulatory Visit: Admitting: Obstetrics and Gynecology
